# Patient Record
Sex: Female | Born: 1940
Health system: Southern US, Community
[De-identification: ages and names within clinical notes are randomized; demographics above are authoritative.]

## PROBLEM LIST (undated history)

## (undated) DIAGNOSIS — I350 Nonrheumatic aortic (valve) stenosis: Secondary | ICD-10-CM

## (undated) DIAGNOSIS — E119 Type 2 diabetes mellitus without complications: Secondary | ICD-10-CM

## (undated) DIAGNOSIS — E785 Hyperlipidemia, unspecified: Secondary | ICD-10-CM

## (undated) DIAGNOSIS — Z859 Personal history of malignant neoplasm, unspecified: Secondary | ICD-10-CM

## (undated) DIAGNOSIS — N201 Calculus of ureter: Secondary | ICD-10-CM

## (undated) DIAGNOSIS — Z860101 Personal history of adenomatous and serrated colon polyps: Secondary | ICD-10-CM

## (undated) DIAGNOSIS — J302 Other seasonal allergic rhinitis: Secondary | ICD-10-CM

## (undated) DIAGNOSIS — J449 Chronic obstructive pulmonary disease, unspecified: Secondary | ICD-10-CM

## (undated) DIAGNOSIS — G4733 Obstructive sleep apnea (adult) (pediatric): Secondary | ICD-10-CM

## (undated) DIAGNOSIS — Z9989 Dependence on other enabling machines and devices: Secondary | ICD-10-CM

## (undated) DIAGNOSIS — K219 Gastro-esophageal reflux disease without esophagitis: Secondary | ICD-10-CM

## (undated) DIAGNOSIS — R3915 Urgency of urination: Secondary | ICD-10-CM

## (undated) DIAGNOSIS — J3089 Other allergic rhinitis: Secondary | ICD-10-CM

## (undated) DIAGNOSIS — Z86010 Personal history of colonic polyps: Secondary | ICD-10-CM

## (undated) DIAGNOSIS — I10 Essential (primary) hypertension: Secondary | ICD-10-CM

## (undated) DIAGNOSIS — Z87442 Personal history of urinary calculi: Secondary | ICD-10-CM

## (undated) DIAGNOSIS — Z9889 Other specified postprocedural states: Secondary | ICD-10-CM

## (undated) DIAGNOSIS — D509 Iron deficiency anemia, unspecified: Secondary | ICD-10-CM

## (undated) DIAGNOSIS — R319 Hematuria, unspecified: Secondary | ICD-10-CM

## (undated) HISTORY — PX: CT CHEST WITH CONTRAST   (ARMC HX): HXRAD1346

## (undated) HISTORY — DX: Essential (primary) hypertension: I10

## (undated) HISTORY — PX: TUBAL LIGATION: SHX77

## (undated) HISTORY — DX: Gastro-esophageal reflux disease without esophagitis: K21.9

## (undated) HISTORY — PX: NM MYOVIEW LTD: HXRAD82

## (undated) HISTORY — PX: COLONOSCOPY: SHX174

## (undated) HISTORY — PX: DOBUTAMINE STRESS ECHO: SHX5426

---
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
## 2000-05-07 ENCOUNTER — Other Ambulatory Visit: Admission: RE | Admit: 2000-05-07 | Discharge: 2000-05-07 | Payer: Self-pay | Admitting: Obstetrics and Gynecology

## 2000-05-09 ENCOUNTER — Emergency Department (HOSPITAL_COMMUNITY): Admission: EM | Admit: 2000-05-09 | Discharge: 2000-05-09 | Payer: Self-pay | Admitting: Emergency Medicine

## 2000-05-09 ENCOUNTER — Encounter: Payer: Self-pay | Admitting: Emergency Medicine

## 2000-07-30 ENCOUNTER — Encounter (INDEPENDENT_AMBULATORY_CARE_PROVIDER_SITE_OTHER): Payer: Self-pay | Admitting: Specialist

## 2000-07-30 ENCOUNTER — Ambulatory Visit (HOSPITAL_COMMUNITY): Admission: RE | Admit: 2000-07-30 | Discharge: 2000-07-30 | Payer: Self-pay | Admitting: Obstetrics and Gynecology

## 2000-07-30 HISTORY — PX: OTHER SURGICAL HISTORY: SHX169

## 2001-05-19 ENCOUNTER — Encounter: Payer: Self-pay | Admitting: Obstetrics and Gynecology

## 2001-05-19 ENCOUNTER — Encounter: Admission: RE | Admit: 2001-05-19 | Discharge: 2001-05-19 | Payer: Self-pay | Admitting: Obstetrics and Gynecology

## 2001-06-06 ENCOUNTER — Encounter: Payer: Self-pay | Admitting: Gastroenterology

## 2001-06-06 ENCOUNTER — Ambulatory Visit (HOSPITAL_COMMUNITY): Admission: RE | Admit: 2001-06-06 | Discharge: 2001-06-06 | Payer: Self-pay | Admitting: Gastroenterology

## 2001-07-21 ENCOUNTER — Ambulatory Visit (HOSPITAL_BASED_OUTPATIENT_CLINIC_OR_DEPARTMENT_OTHER): Admission: RE | Admit: 2001-07-21 | Discharge: 2001-07-21 | Payer: Self-pay | Admitting: Otolaryngology

## 2001-08-18 ENCOUNTER — Encounter: Payer: Self-pay | Admitting: Surgery

## 2001-08-20 ENCOUNTER — Encounter: Payer: Self-pay | Admitting: Surgery

## 2001-08-20 ENCOUNTER — Encounter (INDEPENDENT_AMBULATORY_CARE_PROVIDER_SITE_OTHER): Payer: Self-pay | Admitting: Specialist

## 2001-08-20 ENCOUNTER — Observation Stay (HOSPITAL_COMMUNITY): Admission: RE | Admit: 2001-08-20 | Discharge: 2001-08-21 | Payer: Self-pay | Admitting: Surgery

## 2001-08-20 ENCOUNTER — Encounter: Payer: Self-pay | Admitting: Gastroenterology

## 2001-08-20 HISTORY — PX: LAPAROSCOPIC CHOLECYSTECTOMY: SUR755

## 2001-12-31 HISTORY — PX: CARPAL TUNNEL RELEASE: SHX101

## 2002-05-19 ENCOUNTER — Encounter (INDEPENDENT_AMBULATORY_CARE_PROVIDER_SITE_OTHER): Payer: Self-pay

## 2002-05-19 ENCOUNTER — Ambulatory Visit (HOSPITAL_COMMUNITY): Admission: RE | Admit: 2002-05-19 | Discharge: 2002-05-19 | Payer: Self-pay | Admitting: Gastroenterology

## 2002-05-20 ENCOUNTER — Encounter: Admission: RE | Admit: 2002-05-20 | Discharge: 2002-05-20 | Payer: Self-pay | Admitting: Obstetrics and Gynecology

## 2002-05-20 ENCOUNTER — Encounter: Payer: Self-pay | Admitting: Obstetrics and Gynecology

## 2002-05-21 ENCOUNTER — Other Ambulatory Visit: Admission: RE | Admit: 2002-05-21 | Discharge: 2002-05-21 | Payer: Self-pay | Admitting: Obstetrics and Gynecology

## 2003-07-01 ENCOUNTER — Encounter: Payer: Self-pay | Admitting: Obstetrics and Gynecology

## 2003-07-01 ENCOUNTER — Encounter: Admission: RE | Admit: 2003-07-01 | Discharge: 2003-07-01 | Payer: Self-pay | Admitting: Obstetrics and Gynecology

## 2003-07-06 ENCOUNTER — Other Ambulatory Visit: Admission: RE | Admit: 2003-07-06 | Discharge: 2003-07-06 | Payer: Self-pay | Admitting: Obstetrics and Gynecology

## 2003-08-12 ENCOUNTER — Encounter: Payer: Self-pay | Admitting: Obstetrics and Gynecology

## 2003-08-12 ENCOUNTER — Ambulatory Visit (HOSPITAL_COMMUNITY): Admission: RE | Admit: 2003-08-12 | Discharge: 2003-08-12 | Payer: Self-pay | Admitting: Obstetrics and Gynecology

## 2004-07-12 ENCOUNTER — Ambulatory Visit (HOSPITAL_COMMUNITY): Admission: RE | Admit: 2004-07-12 | Discharge: 2004-07-12 | Payer: Self-pay | Admitting: Obstetrics & Gynecology

## 2004-10-23 ENCOUNTER — Encounter
Admission: RE | Admit: 2004-10-23 | Discharge: 2005-01-21 | Payer: Self-pay | Admitting: Physical Medicine & Rehabilitation

## 2005-07-31 ENCOUNTER — Ambulatory Visit (HOSPITAL_COMMUNITY): Admission: RE | Admit: 2005-07-31 | Discharge: 2005-07-31 | Payer: Self-pay | Admitting: Obstetrics & Gynecology

## 2005-12-23 ENCOUNTER — Emergency Department (HOSPITAL_COMMUNITY): Admission: EM | Admit: 2005-12-23 | Discharge: 2005-12-23 | Payer: Self-pay | Admitting: Emergency Medicine

## 2006-04-29 ENCOUNTER — Encounter: Admission: RE | Admit: 2006-04-29 | Discharge: 2006-04-29 | Payer: Self-pay | Admitting: Orthopedic Surgery

## 2006-04-29 IMAGING — RF DG MYELOGRAM LUMBAR
14 of 16 series · 14 of 16 positions shown · IV contrast (omnipaque)
Comparison: none

CLINICAL DATA: Low back pain and right lower extremity pain with standing and
walking. No previous lumbar surgery.

LUMBAR MYELOGRAM:
CT LUMBAR SPINE WITH INTRATHECAL CONTRAST:
TECHNIQUE: An appropriate entry site was determined under fluoroscopy. Skin site
was marked, prepped with Betadine, and draped in usual sterile fashion, and
infiltrated locally with 1% lidocaine. A 22 gauge spinal needle was  advanced
into the thecal sac at L4-L5 using an interlaminar approach . Clear colorless
CSF returned. 17 ml Omnipaque 180 were administered intrathecally for lumbar
myelography, followed by axial CT scanning of the lumbar spine. Coronal and
sagittal reconstructions were generated from the axial scan.

[Series 1: (hospital) · 1 of 1 slices shown]
[im 1/1]
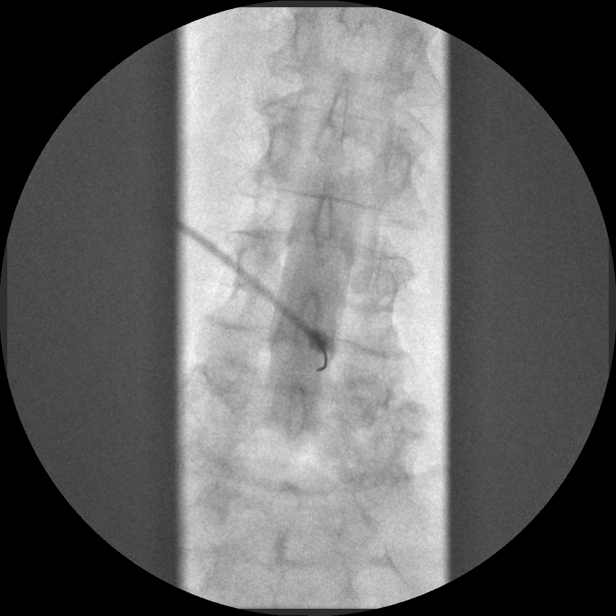

[Series 2: myelogram  white · 1 of 1 slices shown (1 of 13)]
[im 1/1]
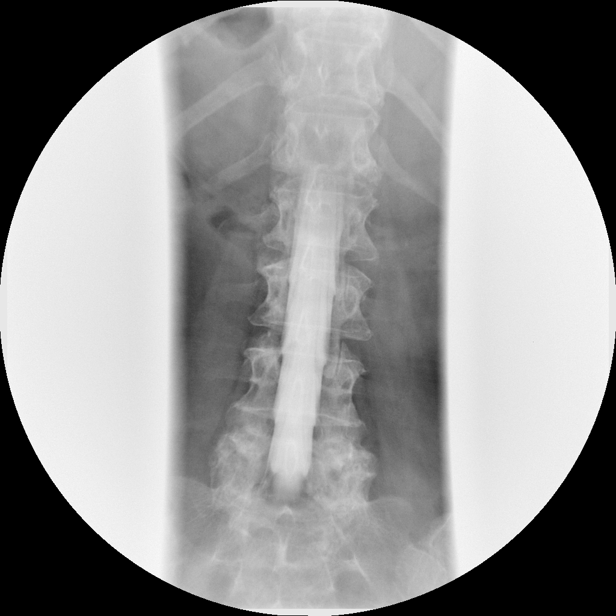

[Series 3: myelogram  white · 1 of 1 slices shown (2 of 13)]
[im 1/1]
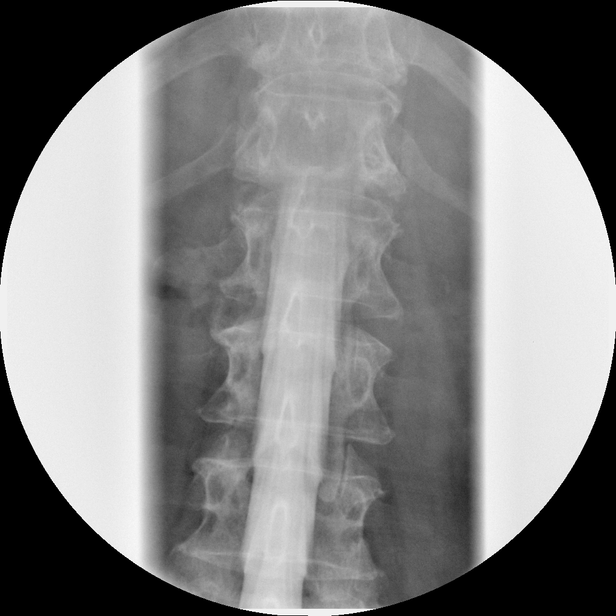

[Series 5: myelogram  white · 1 of 1 slices shown (3 of 13)]
[im 1/1]
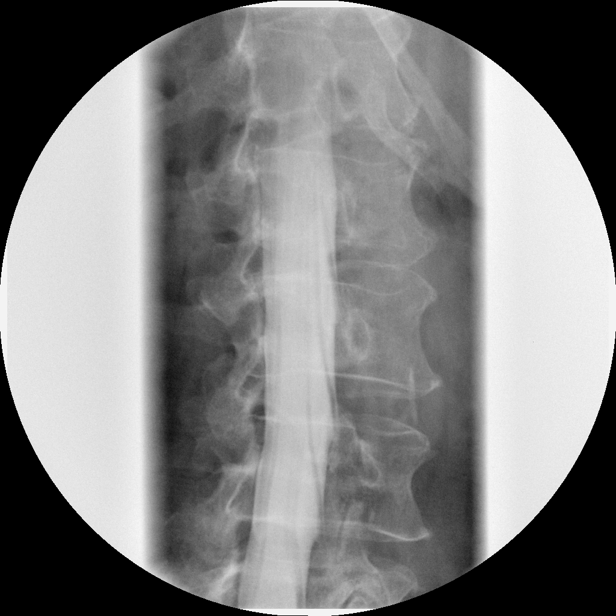

[Series 6: myelogram  white · 1 of 1 slices shown (4 of 13)]
[im 1/1]
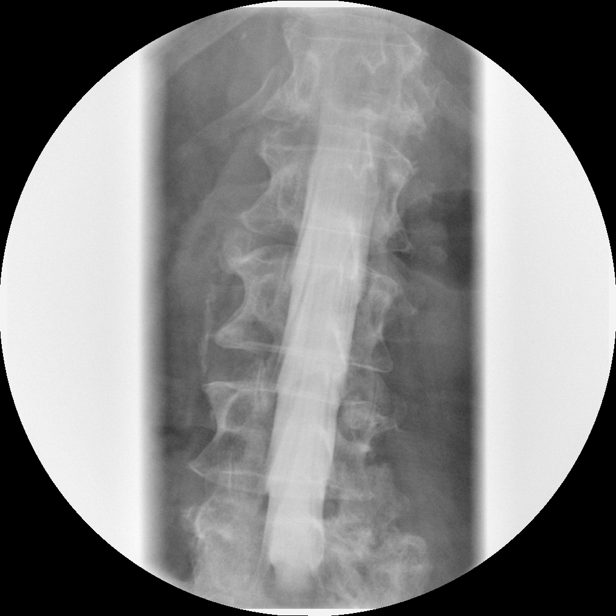

[Series 7: myelogram  white · 1 of 1 slices shown (5 of 13)]
[im 1/1]
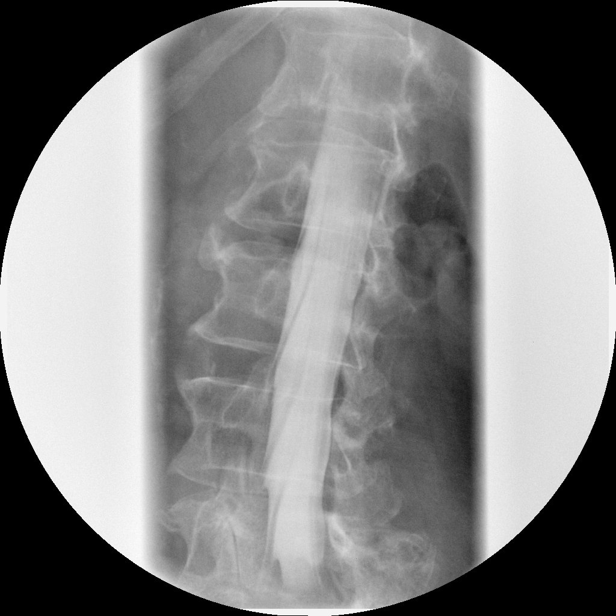

[Series 8: myelogram  white · 1 of 1 slices shown (6 of 13)]
[im 1/1]
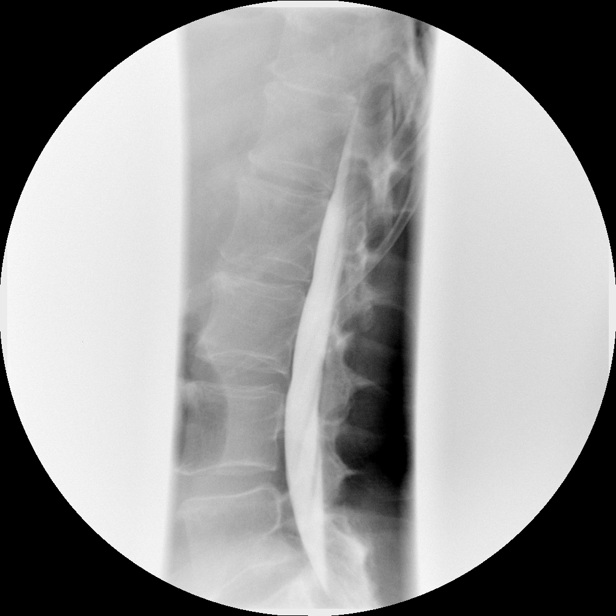

[Series 9: myelogram  white · 1 of 1 slices shown (7 of 13)]
[im 1/1]
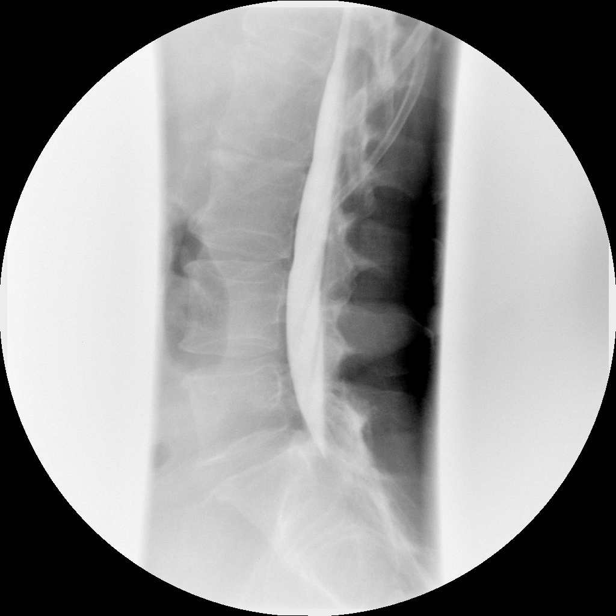

[Series 10: myelogram  white · 1 of 1 slices shown (8 of 13)]
[im 1/1]
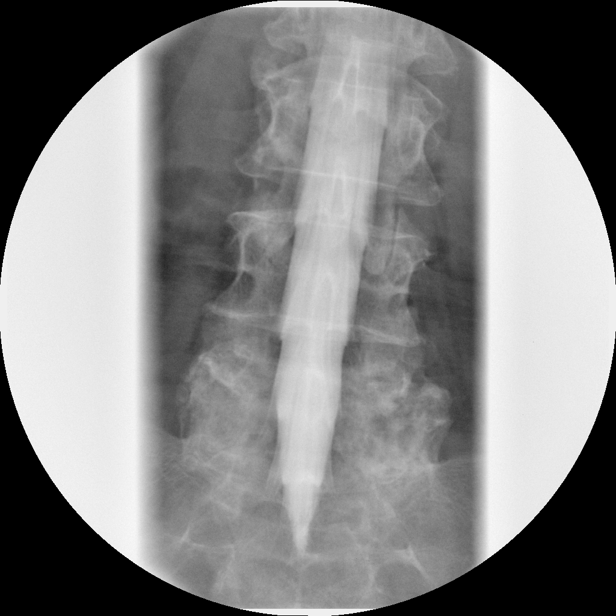

[Series 11: myelogram  white · 1 of 1 slices shown (9 of 13)]
[im 1/1]
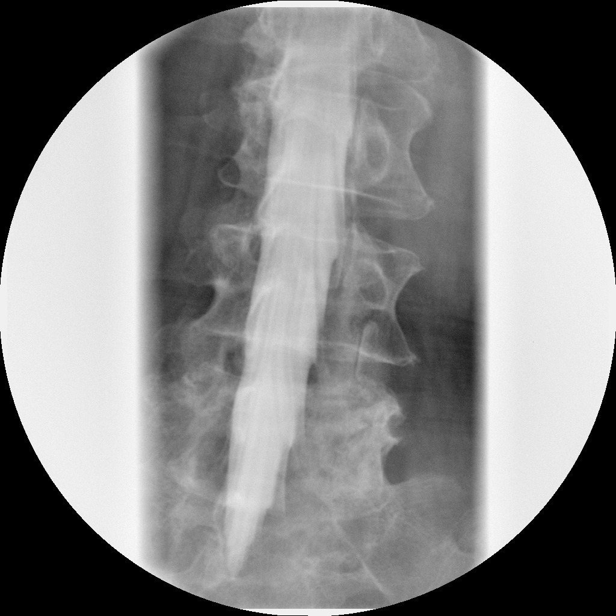

[Series 13: myelogram  white · 1 of 1 slices shown (10 of 13)]
[im 1/1]
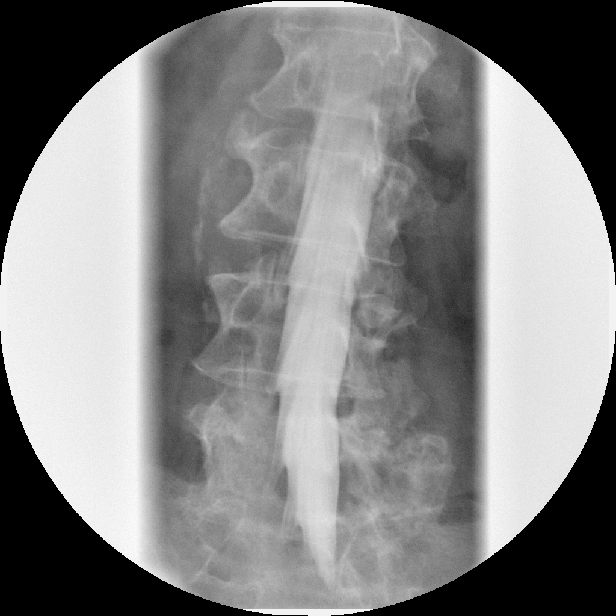

[Series 14: myelogram  white · 1 of 1 slices shown (11 of 13)]
[im 1/1]
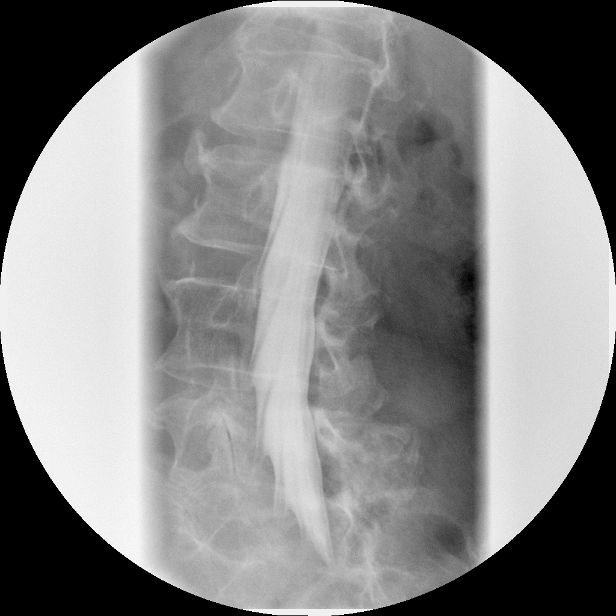

[Series 15: myelogram  white · 1 of 1 slices shown (12 of 13)]
[im 1/1]
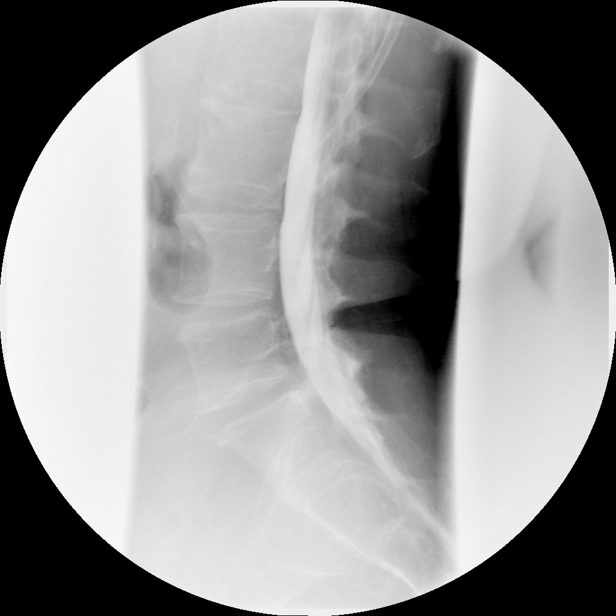

[Series 16: myelogram  white · 1 of 1 slices shown (13 of 13)]
[im 1/1]
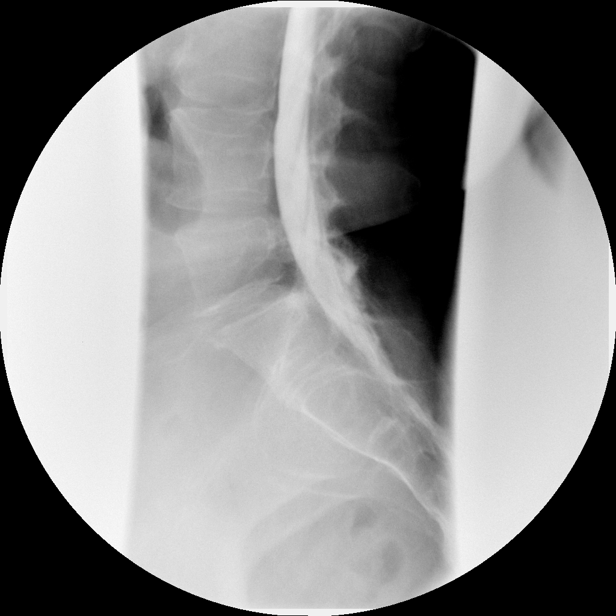

[14 of 16 positions shown; findings below may reference images not displayed]

No previous for comparison.

L1-L2: Mild anterior end plate spurring. Normal conus terminates at the level of
the interspace. No spinal stenosis. Neural foramina widely patent.

L2-L3: Mild circumferential disc bulge with anterior endplate spurs. Mild
bilateral facet degenerative hypertrophy. No spinal stenosis. No significant
neural foraminal encroachment.

L3-L4: Mild asymmetric disc bulge left greater than right. Mild bilateral facet
degenerative hypertrophy and some early thickening of ligamentum flavum. No
spinal stenosis. No significant neural foraminal encroachment.

L4-L5: Bilateral facet degenerative hypertrophy and thickening of ligamentum
flavum. Mild circumferential disc bulge. No significant spinal stenosis. Neural
foramina remain widely patent.

L5-S1: Advanced bilateral facet degenerative hypertrophy, right worse than left.
Mild circumferential disc bulge. Early grade 1 anterolisthesis probably
secondary to facet disease. No pars defect. Early encroachment on the neural
foramina bilaterally, left greater than right. No spinal stenosis.

Patchy atheromatous calcification throughout the abdominal aorta without
aneurysm. Standing lateral flexion and extension radiographs demonstrate no
dynamic instability.
IMPRESSION: 1. Mild facet disease and disc bulges L2-L3, L3-L4, L4-L5 without compressive
pathology.
2. Advanced bilateral facet degenerative changes L5-S1 with early grade 1
anterolisthesis and early neural foraminal encroachment, left greater than
right.
3. No significant spinal stenosis.

## 2006-04-29 IMAGING — CR DG MYELOGRAM LUMBAR
3 series · 3 of 3 positions shown · IV contrast (omnipaque)
Comparison: none

CLINICAL DATA: Low back pain and right lower extremity pain with standing and
walking. No previous lumbar surgery.

LUMBAR MYELOGRAM:
CT LUMBAR SPINE WITH INTRATHECAL CONTRAST:
TECHNIQUE: An appropriate entry site was determined under fluoroscopy. Skin site
was marked, prepped with Betadine, and draped in usual sterile fashion, and
infiltrated locally with 1% lidocaine. A 22 gauge spinal needle was  advanced
into the thecal sac at L4-L5 using an interlaminar approach . Clear colorless
CSF returned. 17 ml Omnipaque 180 were administered intrathecally for lumbar
myelography, followed by axial CT scanning of the lumbar spine. Coronal and
sagittal reconstructions were generated from the axial scan.

[view not recorded (1 of 3)]
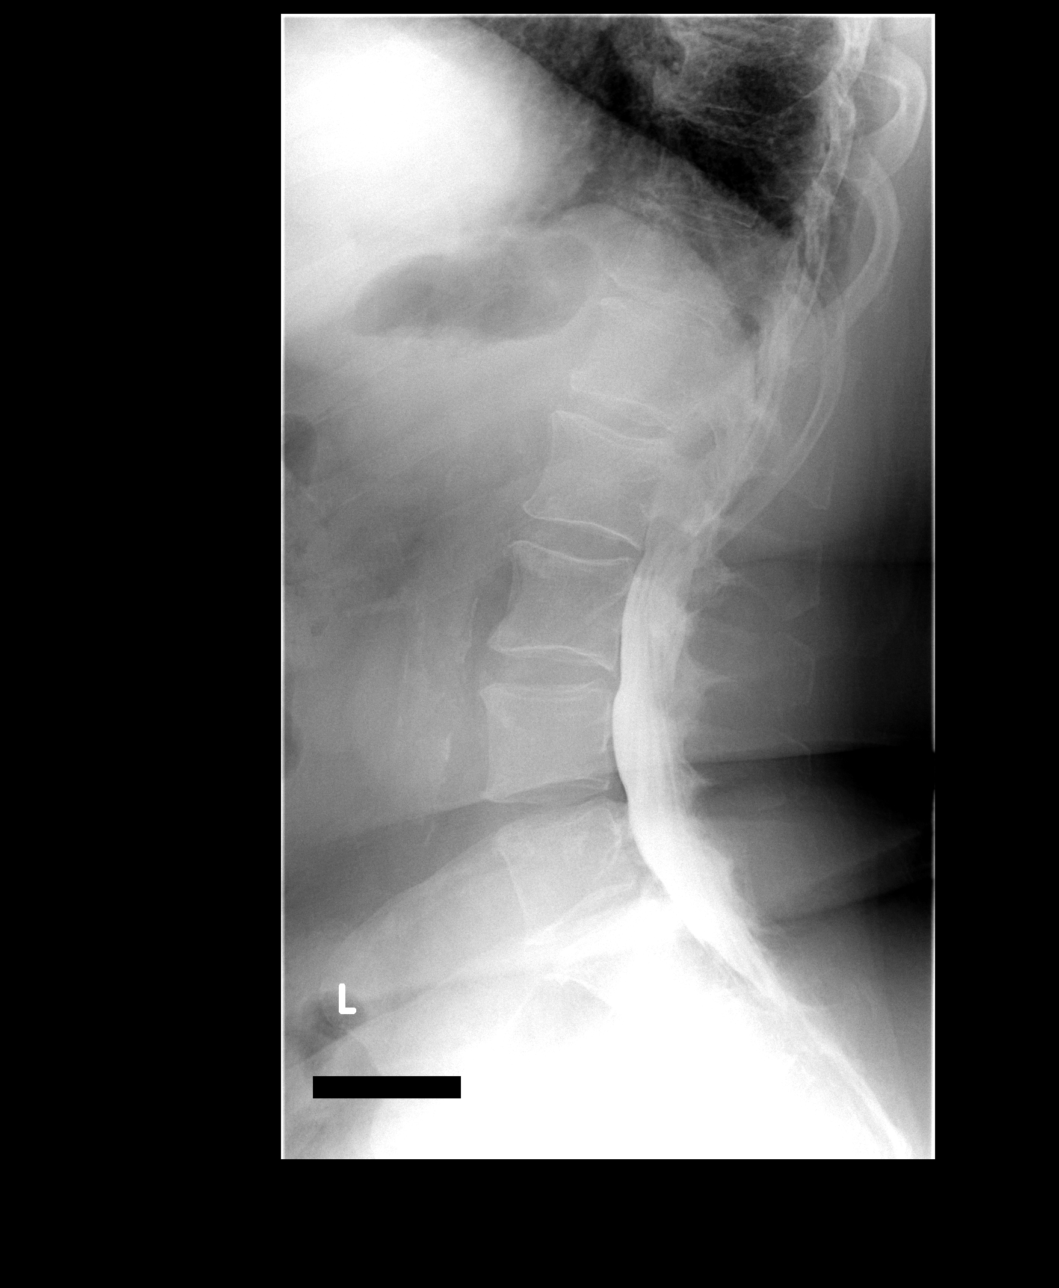

[view not recorded (2 of 3)]
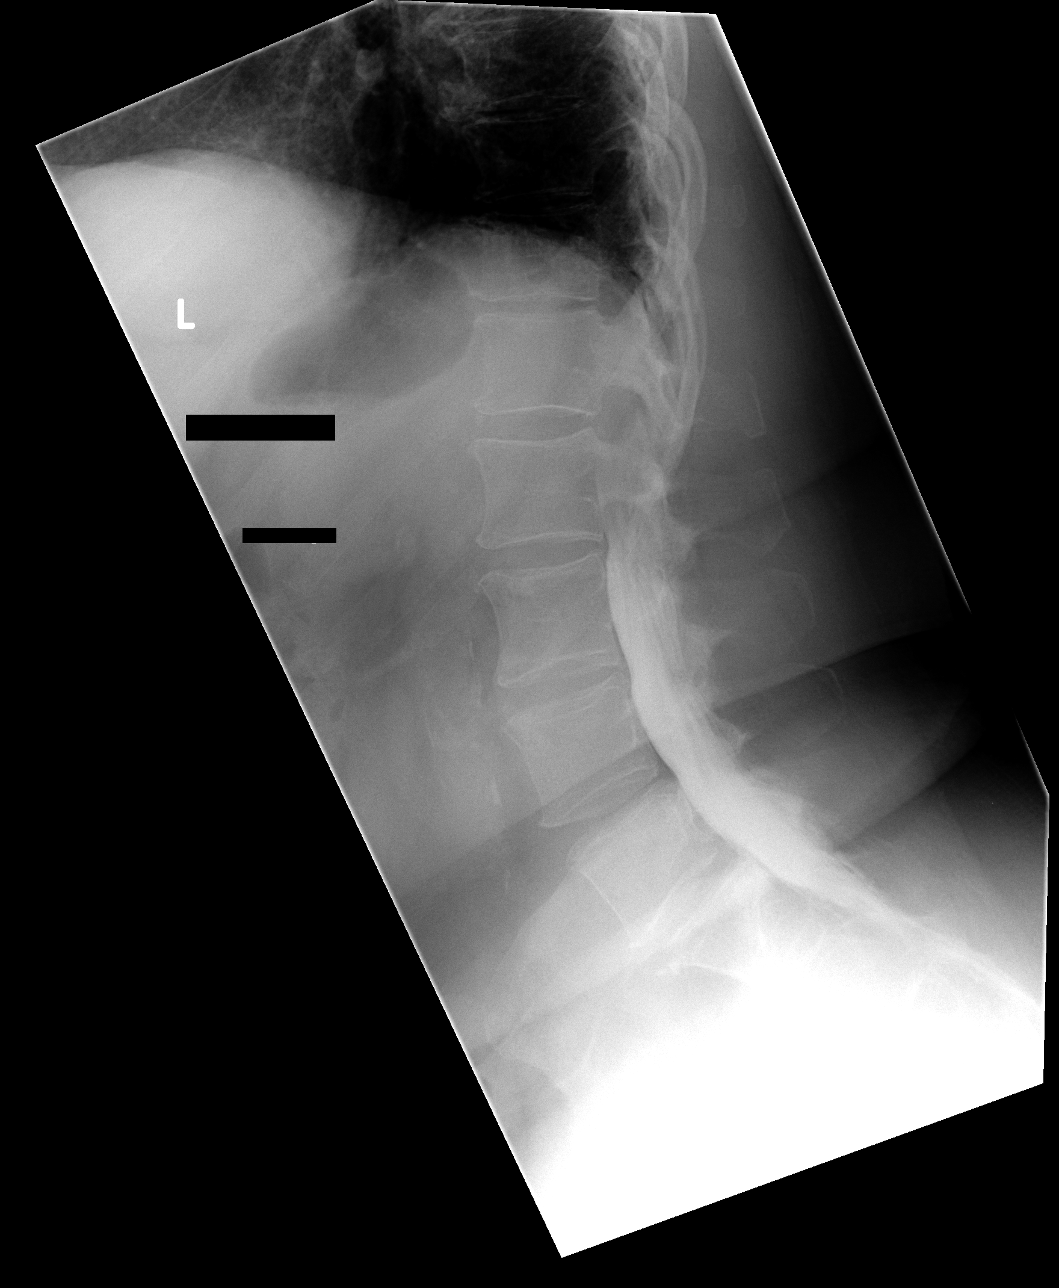

[view not recorded (3 of 3)]
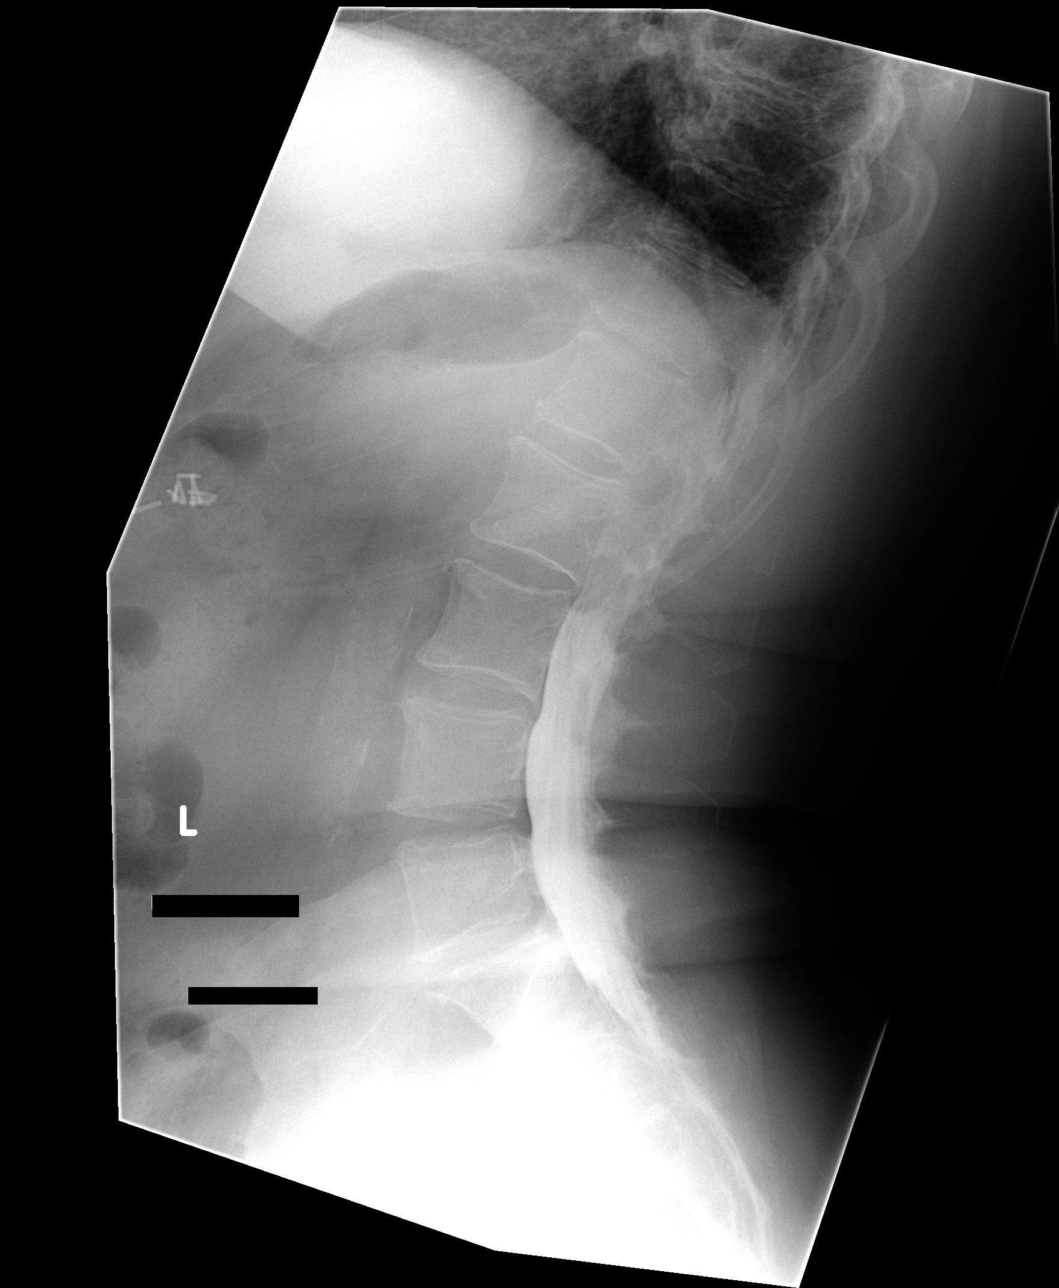

[3 of 3 positions shown; findings below may reference images not displayed]

No previous for comparison.

L1-L2: Mild anterior end plate spurring. Normal conus terminates at the level of
the interspace. No spinal stenosis. Neural foramina widely patent.

L2-L3: Mild circumferential disc bulge with anterior endplate spurs. Mild
bilateral facet degenerative hypertrophy. No spinal stenosis. No significant
neural foraminal encroachment.

L3-L4: Mild asymmetric disc bulge left greater than right. Mild bilateral facet
degenerative hypertrophy and some early thickening of ligamentum flavum. No
spinal stenosis. No significant neural foraminal encroachment.

L4-L5: Bilateral facet degenerative hypertrophy and thickening of ligamentum
flavum. Mild circumferential disc bulge. No significant spinal stenosis. Neural
foramina remain widely patent.

L5-S1: Advanced bilateral facet degenerative hypertrophy, right worse than left.
Mild circumferential disc bulge. Early grade 1 anterolisthesis probably
secondary to facet disease. No pars defect. Early encroachment on the neural
foramina bilaterally, left greater than right. No spinal stenosis.

Patchy atheromatous calcification throughout the abdominal aorta without
aneurysm. Standing lateral flexion and extension radiographs demonstrate no
dynamic instability.
IMPRESSION: 1. Mild facet disease and disc bulges L2-L3, L3-L4, L4-L5 without compressive
pathology.
2. Advanced bilateral facet degenerative changes L5-S1 with early grade 1
anterolisthesis and early neural foraminal encroachment, left greater than
right.
3. No significant spinal stenosis.

## 2006-04-29 IMAGING — CT CT L SPINE W/ CM
3 of 8 series · 15 of 33 positions shown, 18 images · IV contrast (omnipaque)
Comparison: none

CLINICAL DATA: Low back pain and right lower extremity pain with standing and
walking. No previous lumbar surgery.

LUMBAR MYELOGRAM:
CT LUMBAR SPINE WITH INTRATHECAL CONTRAST:
TECHNIQUE: An appropriate entry site was determined under fluoroscopy. Skin site
was marked, prepped with Betadine, and draped in usual sterile fashion, and
infiltrated locally with 1% lidocaine. A 22 gauge spinal needle was  advanced
into the thecal sac at L4-L5 using an interlaminar approach . Clear colorless
CSF returned. 17 ml Omnipaque 180 were administered intrathecally for lumbar
myelography, followed by axial CT scanning of the lumbar spine. Coronal and
sagittal reconstructions were generated from the axial scan.

[Series 4: recon 3: l-spine helical · axial · 0.27mm/px · z∈[-46,+78]mm · 7 of 133 slices shown, 9 images]
[im 17/133  soft-tissue]
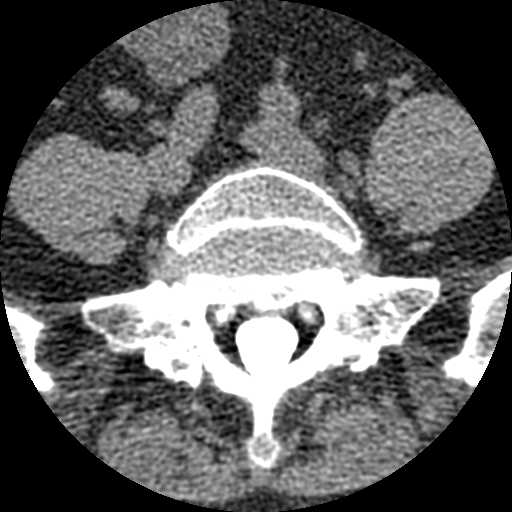
[im 17/133  bone]
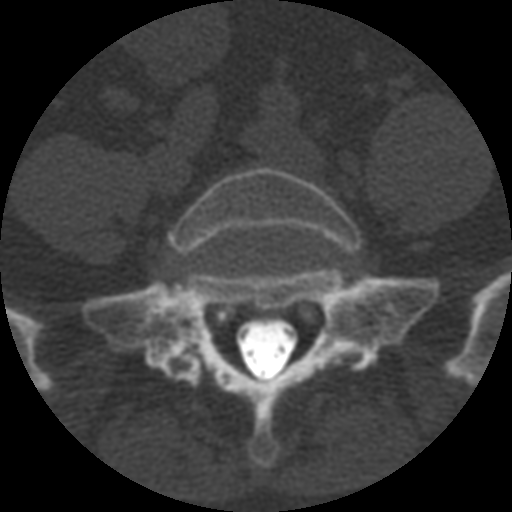
[im 34/133  bone]
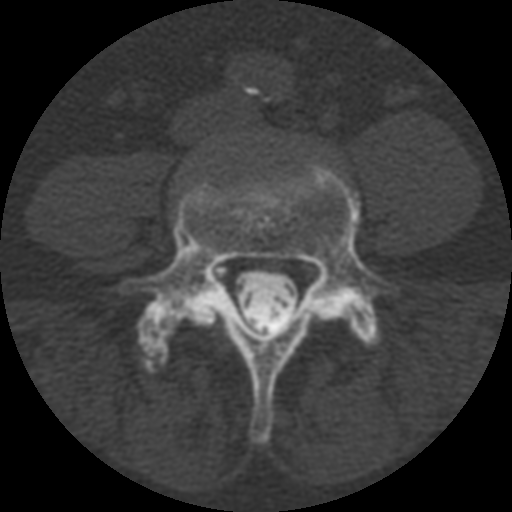
[im 50/133  bone]
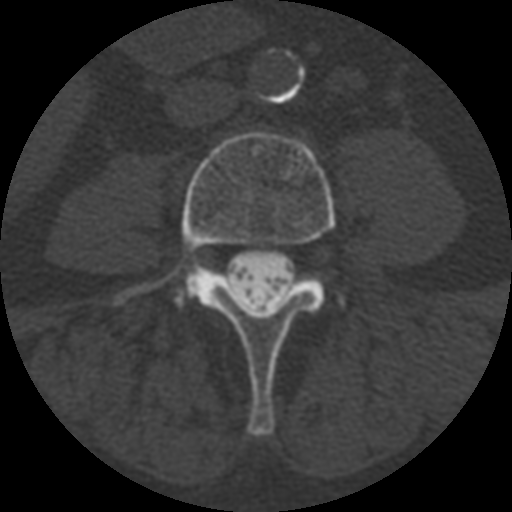
[im 67/133  bone]
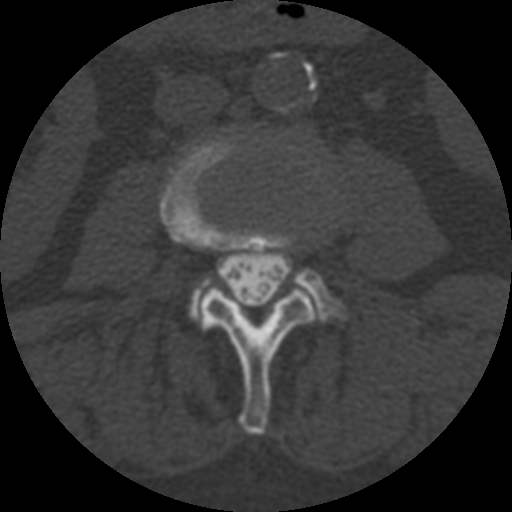
[im 83/133  soft-tissue]
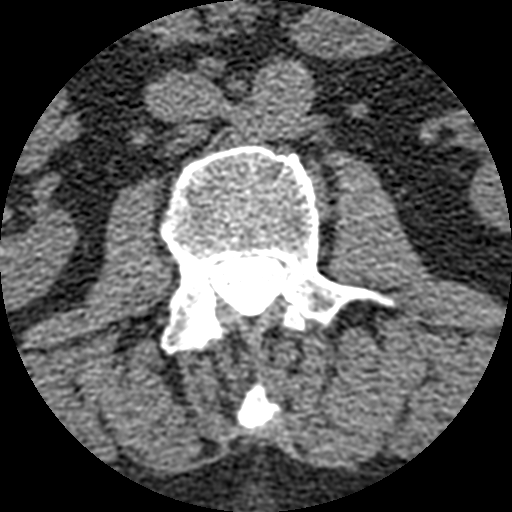
[im 83/133  bone]
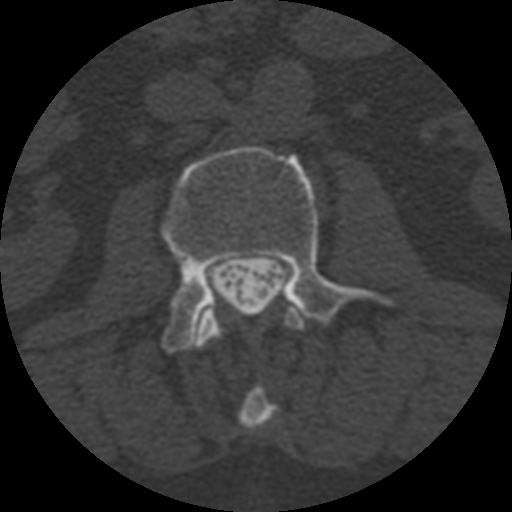
[im 100/133  bone]
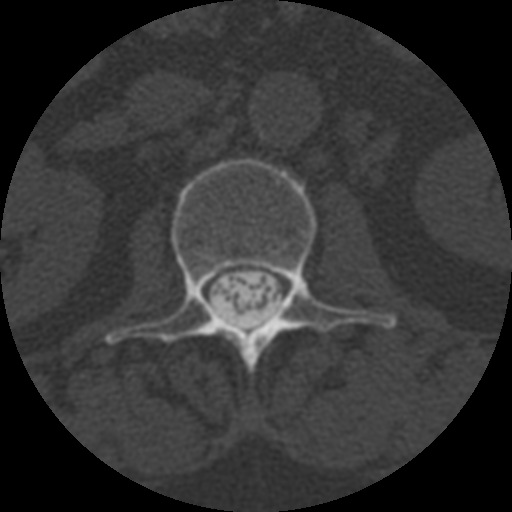
[im 116/133  bone]
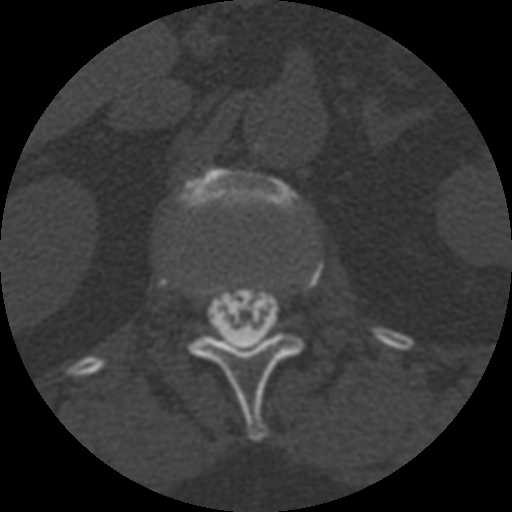

[Series 400: reformatted · sagittal · 0.33mm/px · 5 of 40 slices shown, 6 images (1 of 2)]
[im 14/40  bone]
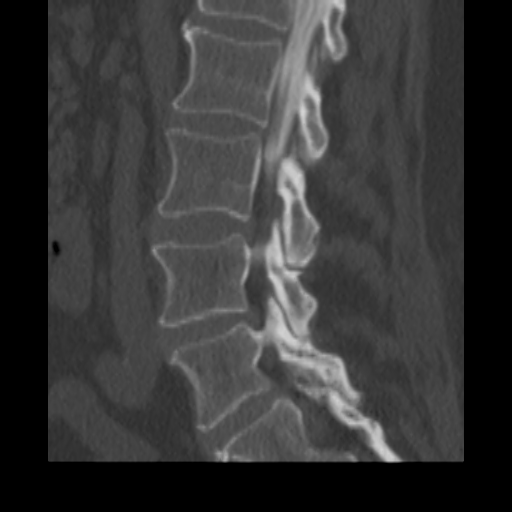
[im 17/40  bone]
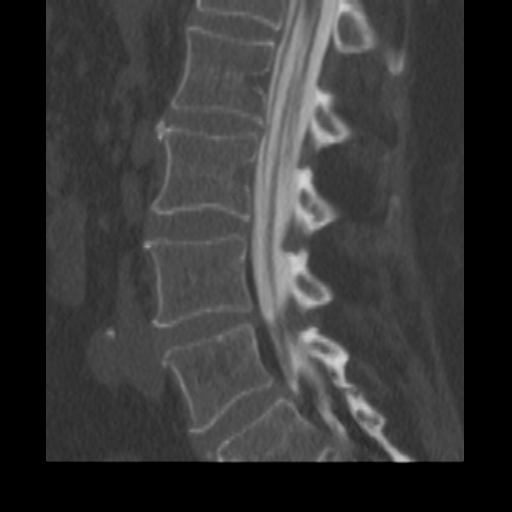
[im 20/40  soft-tissue]
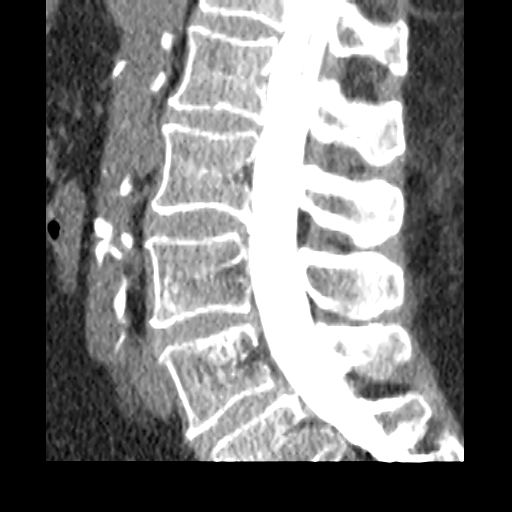
[im 20/40  bone]
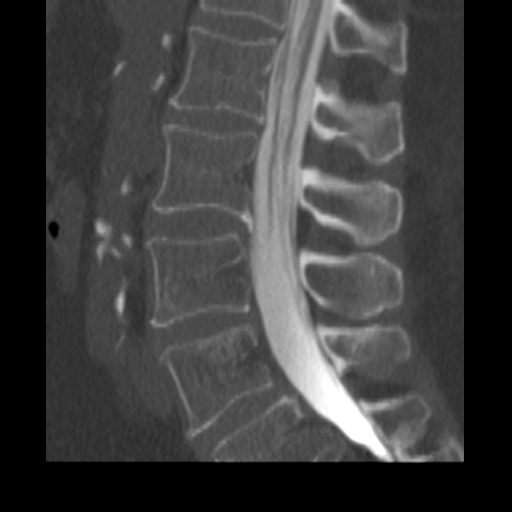
[im 23/40  bone]
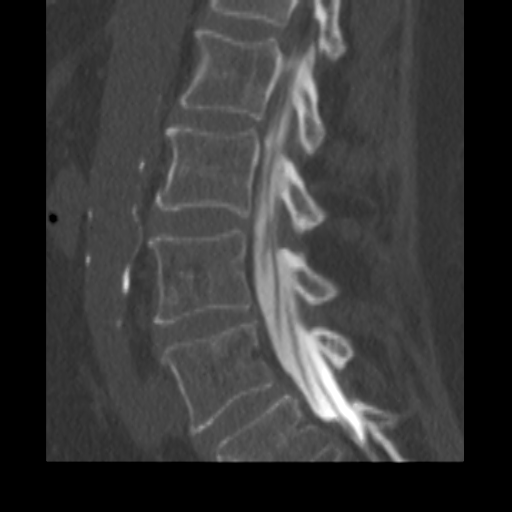
[im 27/40  bone]
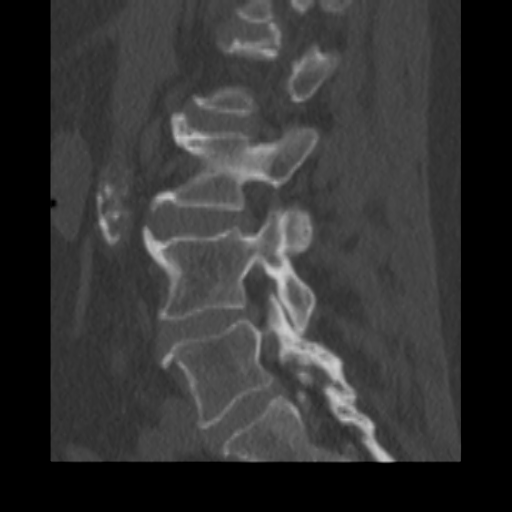

[Series 401: reformatted · coronal · 0.33mm/px · 3 of 40 slices shown (2 of 2)]
[im 8/40  bone]
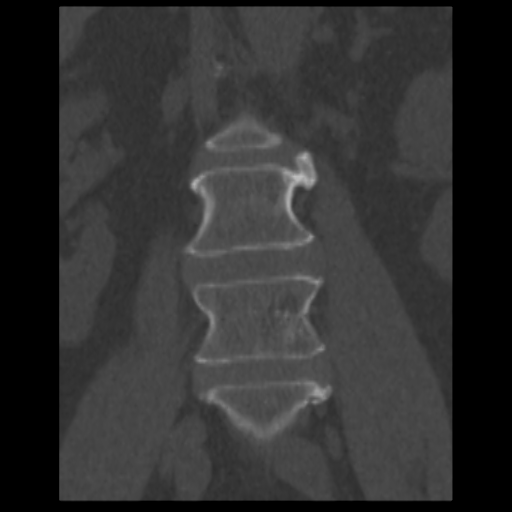
[im 16/40  bone]
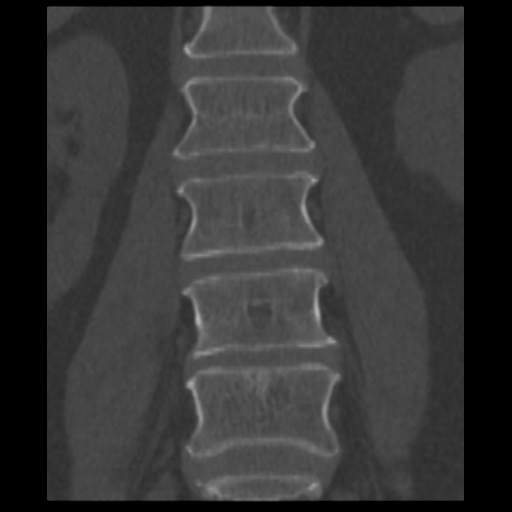
[im 24/40  bone]
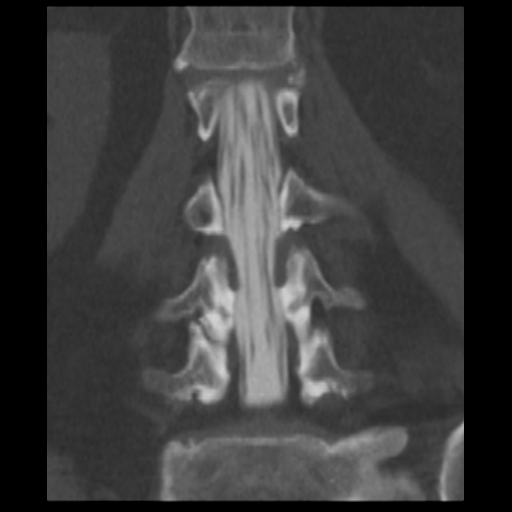

[15 of 33 positions shown; findings below may reference images not displayed]

No previous for comparison.

L1-L2: Mild anterior end plate spurring. Normal conus terminates at the level of
the interspace. No spinal stenosis. Neural foramina widely patent.

L2-L3: Mild circumferential disc bulge with anterior endplate spurs. Mild
bilateral facet degenerative hypertrophy. No spinal stenosis. No significant
neural foraminal encroachment.

L3-L4: Mild asymmetric disc bulge left greater than right. Mild bilateral facet
degenerative hypertrophy and some early thickening of ligamentum flavum. No
spinal stenosis. No significant neural foraminal encroachment.

L4-L5: Bilateral facet degenerative hypertrophy and thickening of ligamentum
flavum. Mild circumferential disc bulge. No significant spinal stenosis. Neural
foramina remain widely patent.

L5-S1: Advanced bilateral facet degenerative hypertrophy, right worse than left.
Mild circumferential disc bulge. Early grade 1 anterolisthesis probably
secondary to facet disease. No pars defect. Early encroachment on the neural
foramina bilaterally, left greater than right. No spinal stenosis.

Patchy atheromatous calcification throughout the abdominal aorta without
aneurysm. Standing lateral flexion and extension radiographs demonstrate no
dynamic instability.
IMPRESSION: 1. Mild facet disease and disc bulges L2-L3, L3-L4, L4-L5 without compressive
pathology.
2. Advanced bilateral facet degenerative changes L5-S1 with early grade 1
anterolisthesis and early neural foraminal encroachment, left greater than
right.
3. No significant spinal stenosis.

## 2006-05-21 ENCOUNTER — Encounter: Admission: RE | Admit: 2006-05-21 | Discharge: 2006-05-21 | Payer: Self-pay | Admitting: Family Medicine

## 2006-05-21 IMAGING — US UNKNOWN US STUDY
1 series · 4 of 4 positions shown · non-contrast
Comparison: none

DG DIAGNOSTIC UNILATERAL R
CC and MLO view(s) were taken of the right breast.

RIGHT BREAST ULTRASOUND
DIGITAL UNILATERAL RIGHT DIAGNOSTIC MAMMOGRAM WITH CAD AND RIGHT BREAST ULTRASOUND:
CLINICAL DATA: A 65-year-old female with palpable lump inferior right breast.

[Series 1: unknown us study · 4 of 4 slices shown]
[im 1/4]
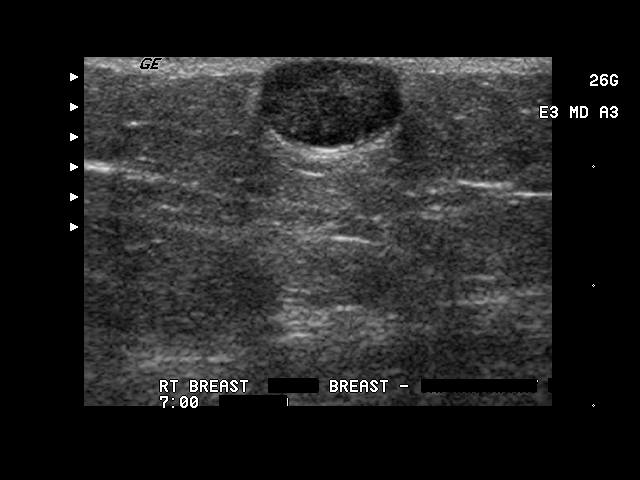
[im 2/4]
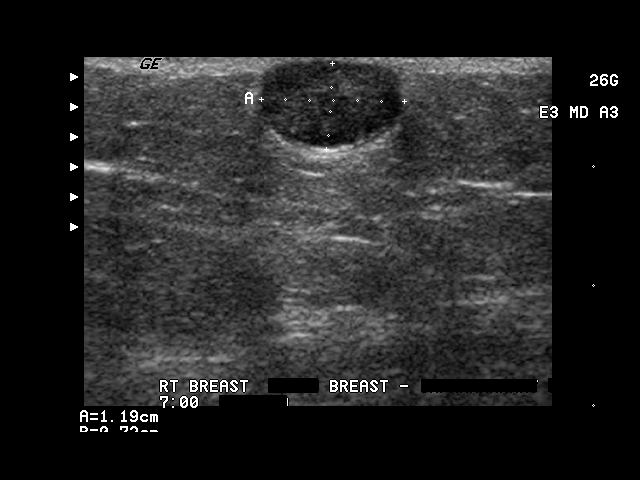
[im 3/4]
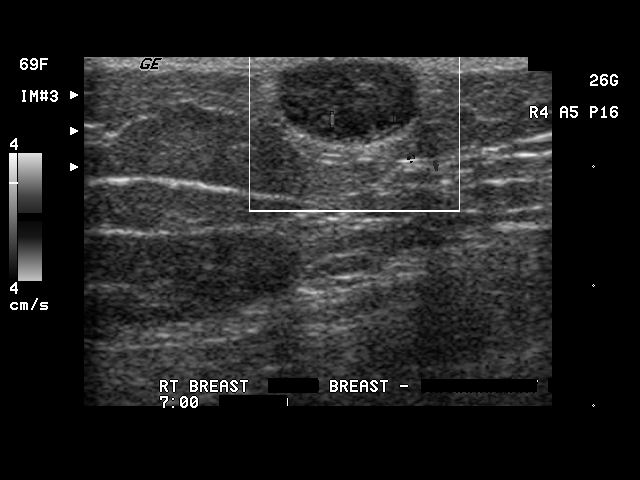
[im 4/4]
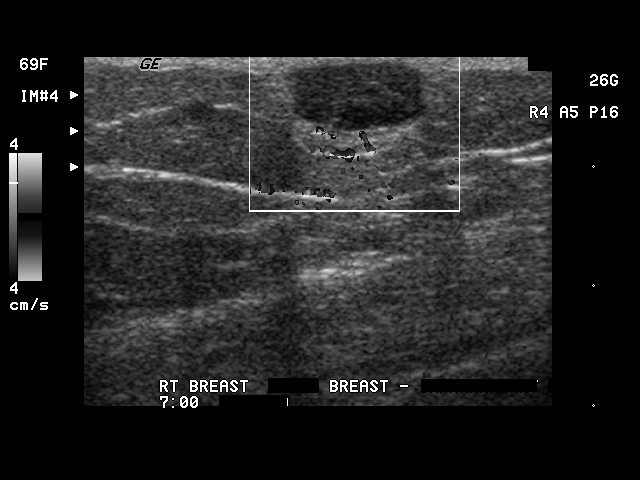

[4 of 4 positions shown; findings below may reference images not displayed]

Comparison to prior films.

Primarily fatty breast tissue is identified.  There is no evidence of mass, architectural 
distortion, or suspicious microcalcifications to suggest malignancy.

On physical examination of the inferior aspect of the right breast, a small moveable nodule in the 
subcutaneous tissues along the right inframammary fold is noted.

Targeted ultrasound of this area demonstrates a 7 x 12 mm well-circumscribed hypoechoic lesion with
posterior through transmission compatible with a sebaceous cyst.  There is no evidence of internal
flow in this lesion.
IMPRESSION: 7 x 12 mm well-circumscribed hypoechoic lesion along the right inframammary fold with physical 
examination and ultrasound findings compatible with a sebaceous cyst.  Recommend annual screening 
mammogram in 3 months.

ASSESSMENT: Benign - BI-RADS 2

Screening mammogram of both breasts in 3 months.
ANALYZED BY COMPUTER AIDED DETECTION. ,

## 2006-05-21 IMAGING — MG MM DIAGNOSTIC UNILATERAL R
3 series · 3 of 3 positions shown · non-contrast
Comparison: none

DG DIAGNOSTIC UNILATERAL R
CC and MLO view(s) were taken of the right breast.

RIGHT BREAST ULTRASOUND
DIGITAL UNILATERAL RIGHT DIAGNOSTIC MAMMOGRAM WITH CAD AND RIGHT BREAST ULTRASOUND:
CLINICAL DATA: A 65-year-old female with palpable lump inferior right breast.

[R CC]
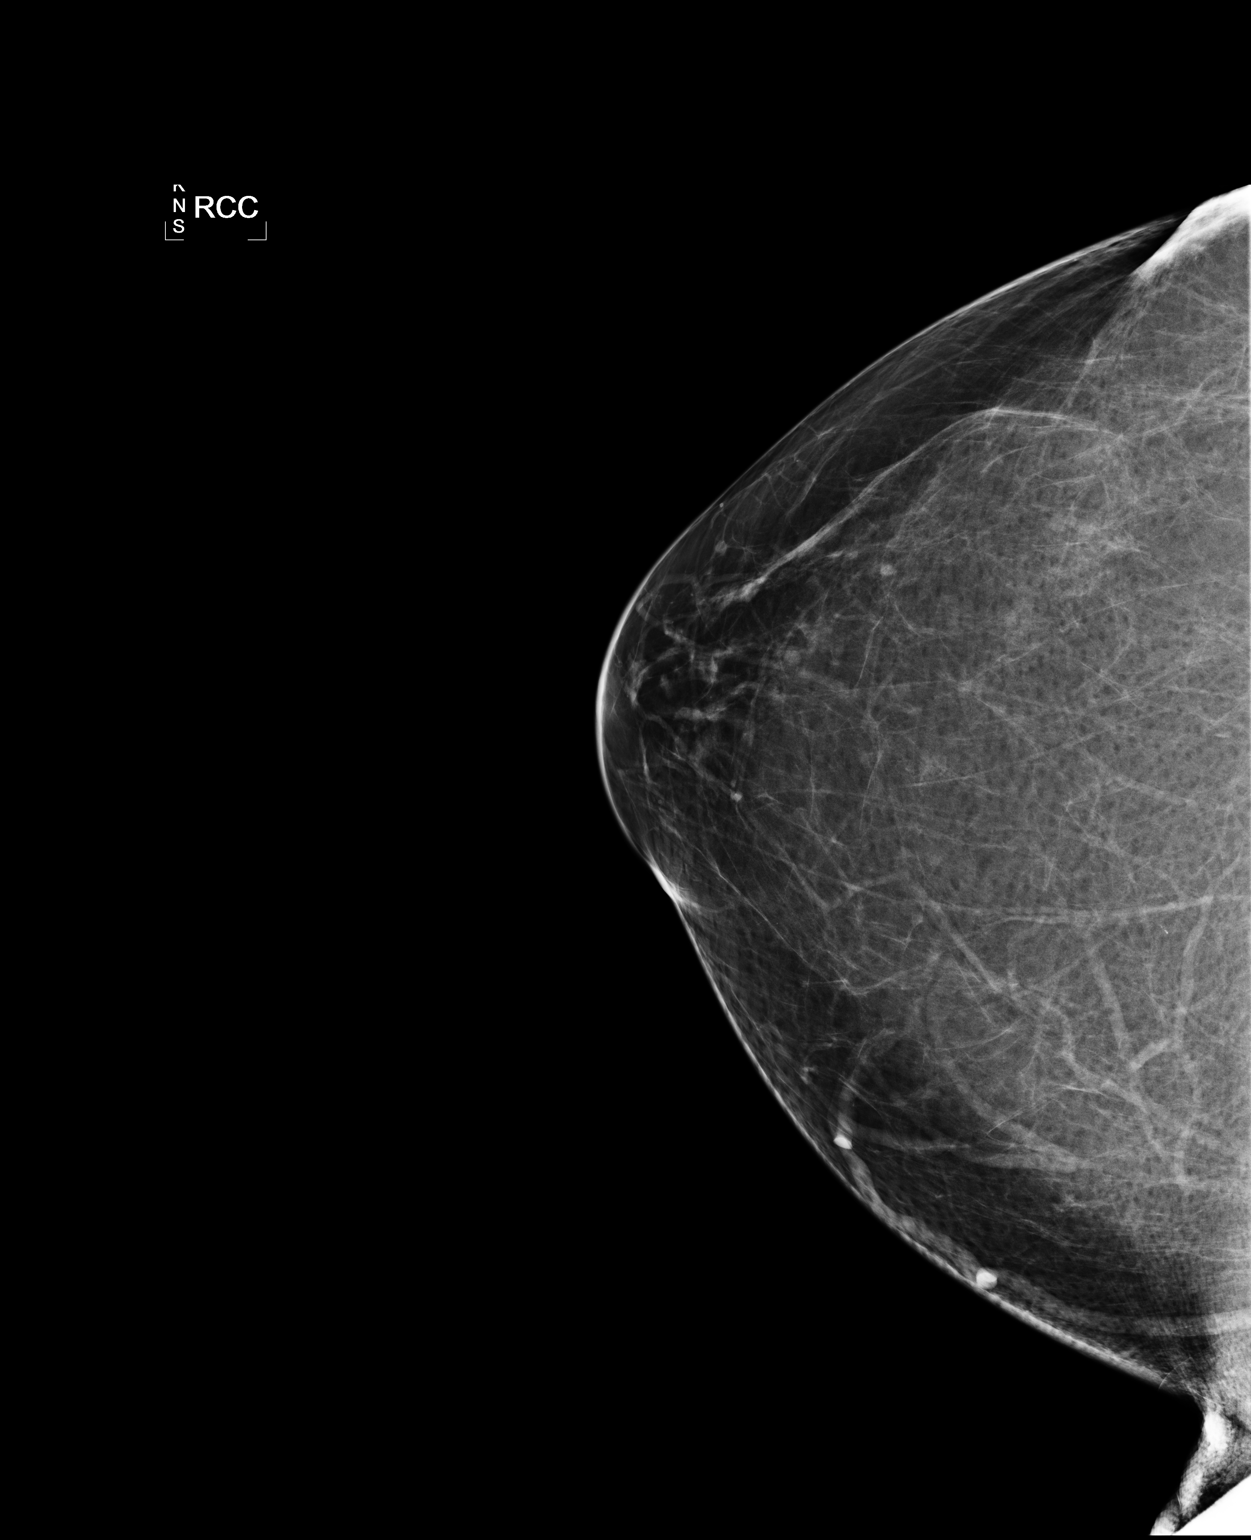

[R MLO]
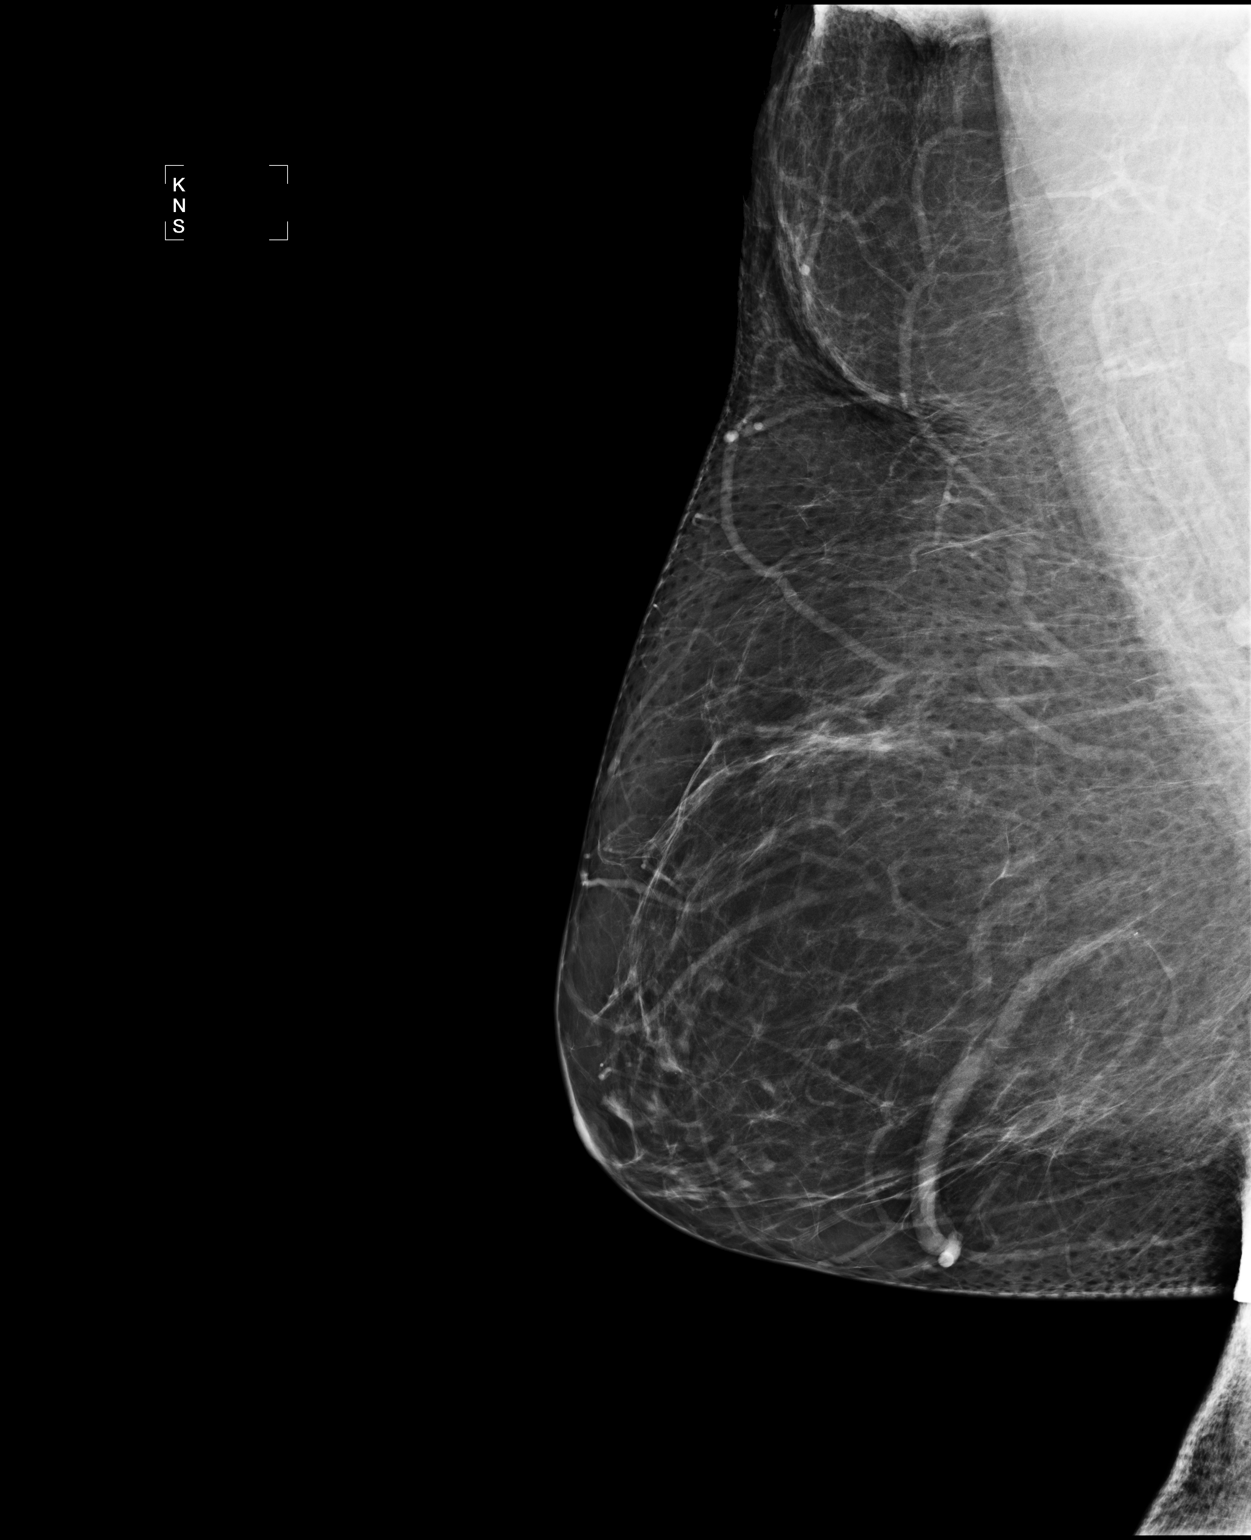

[R TAN]
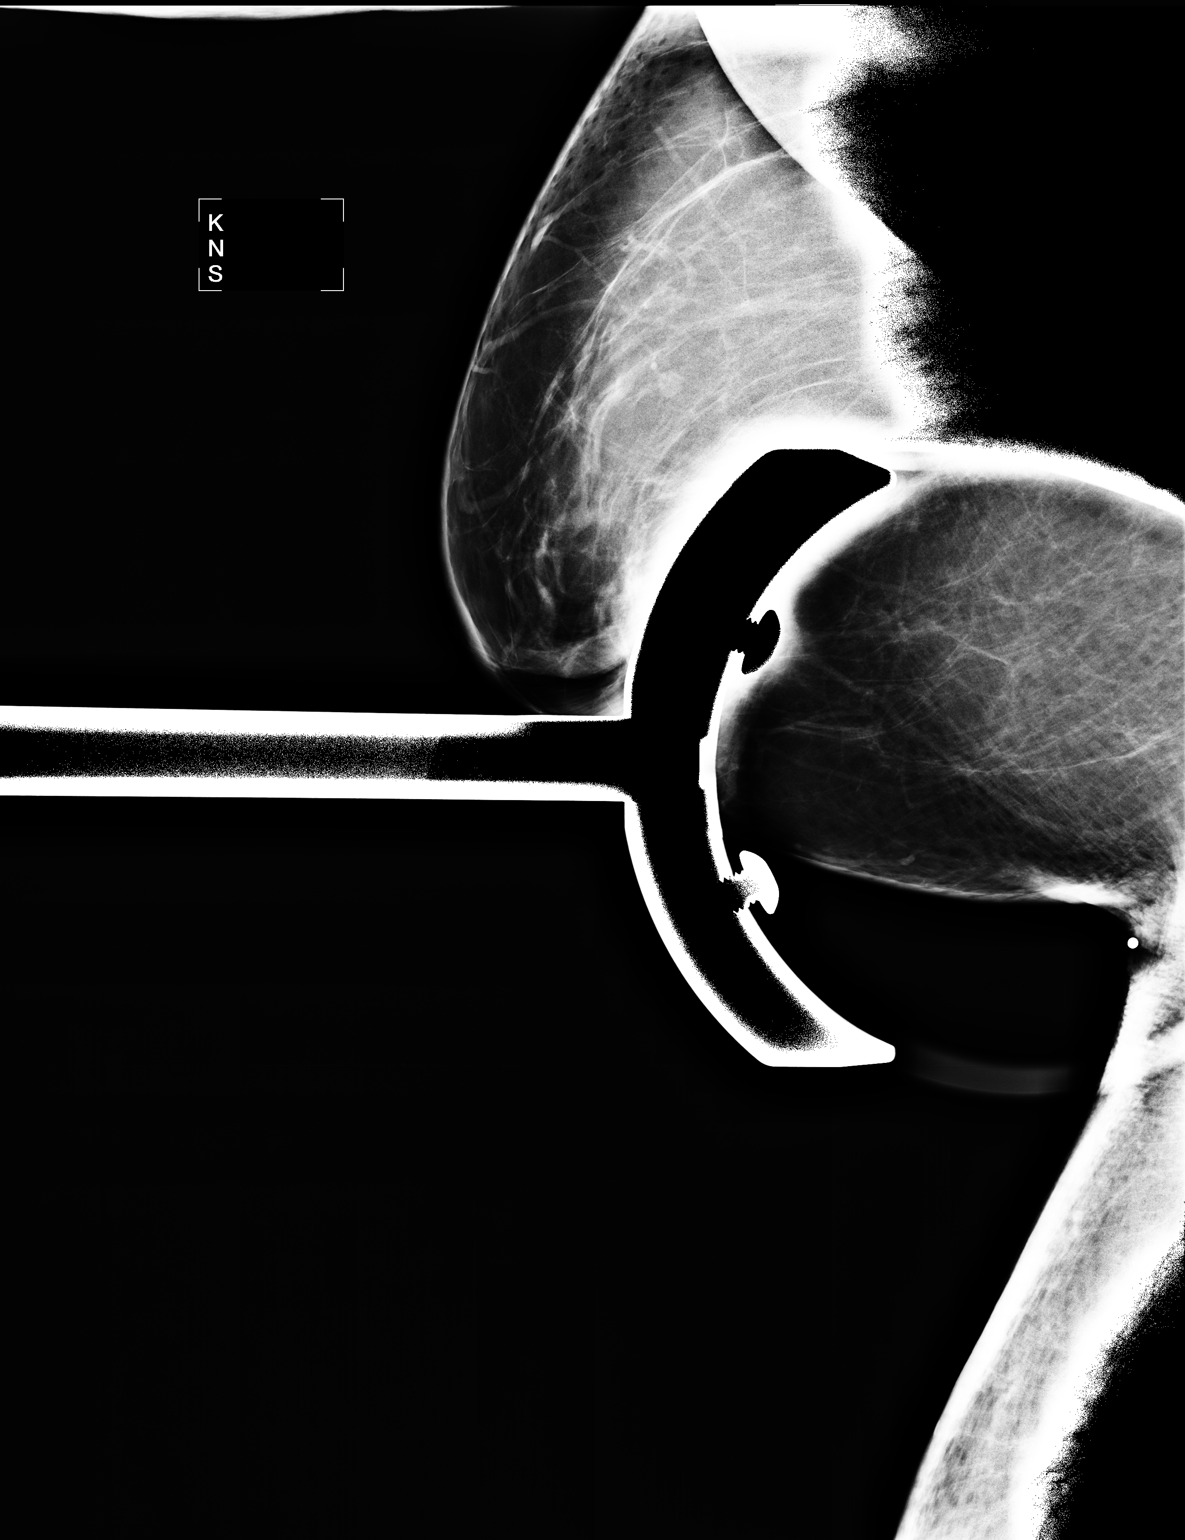

[3 of 3 positions shown; findings below may reference images not displayed]

Comparison to prior films.

Primarily fatty breast tissue is identified.  There is no evidence of mass, architectural 
distortion, or suspicious microcalcifications to suggest malignancy.

On physical examination of the inferior aspect of the right breast, a small moveable nodule in the 
subcutaneous tissues along the right inframammary fold is noted.

Targeted ultrasound of this area demonstrates a 7 x 12 mm well-circumscribed hypoechoic lesion with
posterior through transmission compatible with a sebaceous cyst.  There is no evidence of internal
flow in this lesion.
IMPRESSION: 7 x 12 mm well-circumscribed hypoechoic lesion along the right inframammary fold with physical 
examination and ultrasound findings compatible with a sebaceous cyst.  Recommend annual screening 
mammogram in 3 months.

ASSESSMENT: Benign - BI-RADS 2

Screening mammogram of both breasts in 3 months.
ANALYZED BY COMPUTER AIDED DETECTION. ,

## 2006-08-12 ENCOUNTER — Ambulatory Visit (HOSPITAL_COMMUNITY): Admission: RE | Admit: 2006-08-12 | Discharge: 2006-08-12 | Payer: Self-pay | Admitting: Obstetrics & Gynecology

## 2006-10-10 ENCOUNTER — Ambulatory Visit (HOSPITAL_BASED_OUTPATIENT_CLINIC_OR_DEPARTMENT_OTHER): Admission: RE | Admit: 2006-10-10 | Discharge: 2006-10-10 | Payer: Self-pay | Admitting: Otolaryngology

## 2006-10-10 ENCOUNTER — Encounter: Payer: Self-pay | Admitting: Internal Medicine

## 2006-10-13 ENCOUNTER — Ambulatory Visit: Payer: Self-pay | Admitting: Internal Medicine

## 2006-12-06 ENCOUNTER — Ambulatory Visit: Payer: Self-pay | Admitting: Internal Medicine

## 2006-12-26 ENCOUNTER — Ambulatory Visit: Payer: Self-pay | Admitting: Internal Medicine

## 2007-01-17 ENCOUNTER — Ambulatory Visit: Payer: Self-pay | Admitting: Internal Medicine

## 2007-02-28 ENCOUNTER — Ambulatory Visit: Payer: Self-pay | Admitting: Internal Medicine

## 2007-08-27 ENCOUNTER — Ambulatory Visit: Payer: Self-pay | Admitting: Internal Medicine

## 2007-09-05 ENCOUNTER — Other Ambulatory Visit: Admission: RE | Admit: 2007-09-05 | Discharge: 2007-09-05 | Payer: Self-pay | Admitting: Obstetrics & Gynecology

## 2007-09-05 ENCOUNTER — Ambulatory Visit (HOSPITAL_COMMUNITY): Admission: RE | Admit: 2007-09-05 | Discharge: 2007-09-05 | Payer: Self-pay | Admitting: Obstetrics & Gynecology

## 2008-10-14 ENCOUNTER — Ambulatory Visit (HOSPITAL_COMMUNITY): Admission: RE | Admit: 2008-10-14 | Discharge: 2008-10-14 | Payer: Self-pay | Admitting: Obstetrics & Gynecology

## 2008-11-09 ENCOUNTER — Other Ambulatory Visit: Admission: RE | Admit: 2008-11-09 | Discharge: 2008-11-09 | Payer: Self-pay | Admitting: Obstetrics & Gynecology

## 2010-06-06 ENCOUNTER — Telehealth: Payer: Self-pay | Admitting: Internal Medicine

## 2010-06-06 DIAGNOSIS — G4733 Obstructive sleep apnea (adult) (pediatric): Secondary | ICD-10-CM | POA: Insufficient documentation

## 2010-07-19 ENCOUNTER — Ambulatory Visit: Payer: Self-pay | Admitting: Internal Medicine

## 2010-07-19 DIAGNOSIS — K219 Gastro-esophageal reflux disease without esophagitis: Secondary | ICD-10-CM | POA: Insufficient documentation

## 2010-07-19 DIAGNOSIS — J302 Other seasonal allergic rhinitis: Secondary | ICD-10-CM | POA: Insufficient documentation

## 2010-07-19 DIAGNOSIS — I10 Essential (primary) hypertension: Secondary | ICD-10-CM | POA: Insufficient documentation

## 2010-07-19 DIAGNOSIS — E119 Type 2 diabetes mellitus without complications: Secondary | ICD-10-CM | POA: Insufficient documentation

## 2010-07-19 DIAGNOSIS — F17200 Nicotine dependence, unspecified, uncomplicated: Secondary | ICD-10-CM | POA: Insufficient documentation

## 2010-07-19 DIAGNOSIS — K1120 Sialoadenitis, unspecified: Secondary | ICD-10-CM | POA: Insufficient documentation

## 2010-07-19 DIAGNOSIS — J3089 Other allergic rhinitis: Secondary | ICD-10-CM

## 2011-01-04 ENCOUNTER — Other Ambulatory Visit
Admission: RE | Admit: 2011-01-04 | Discharge: 2011-01-04 | Payer: Self-pay | Source: Home / Self Care | Admitting: Obstetrics & Gynecology

## 2011-01-16 ENCOUNTER — Ambulatory Visit (HOSPITAL_COMMUNITY)
Admission: RE | Admit: 2011-01-16 | Discharge: 2011-01-16 | Payer: Self-pay | Source: Home / Self Care | Attending: *Deleted | Admitting: *Deleted

## 2011-01-16 IMAGING — MG MM DIGITAL SCREENING
4 series · 4 of 4 positions shown · non-contrast
Comparison: none

DG SCREEN MAMMOGRAM BILATERAL
Bilateral CC and MLO view(s) were taken.

DIGITAL SCREENING MAMMOGRAM WITH CAD:
There are scattered fibroglandular densities.  No masses or malignant type calcifications are 
identified.  Compared with prior studies.
Images were processed with CAD.

[L CC]
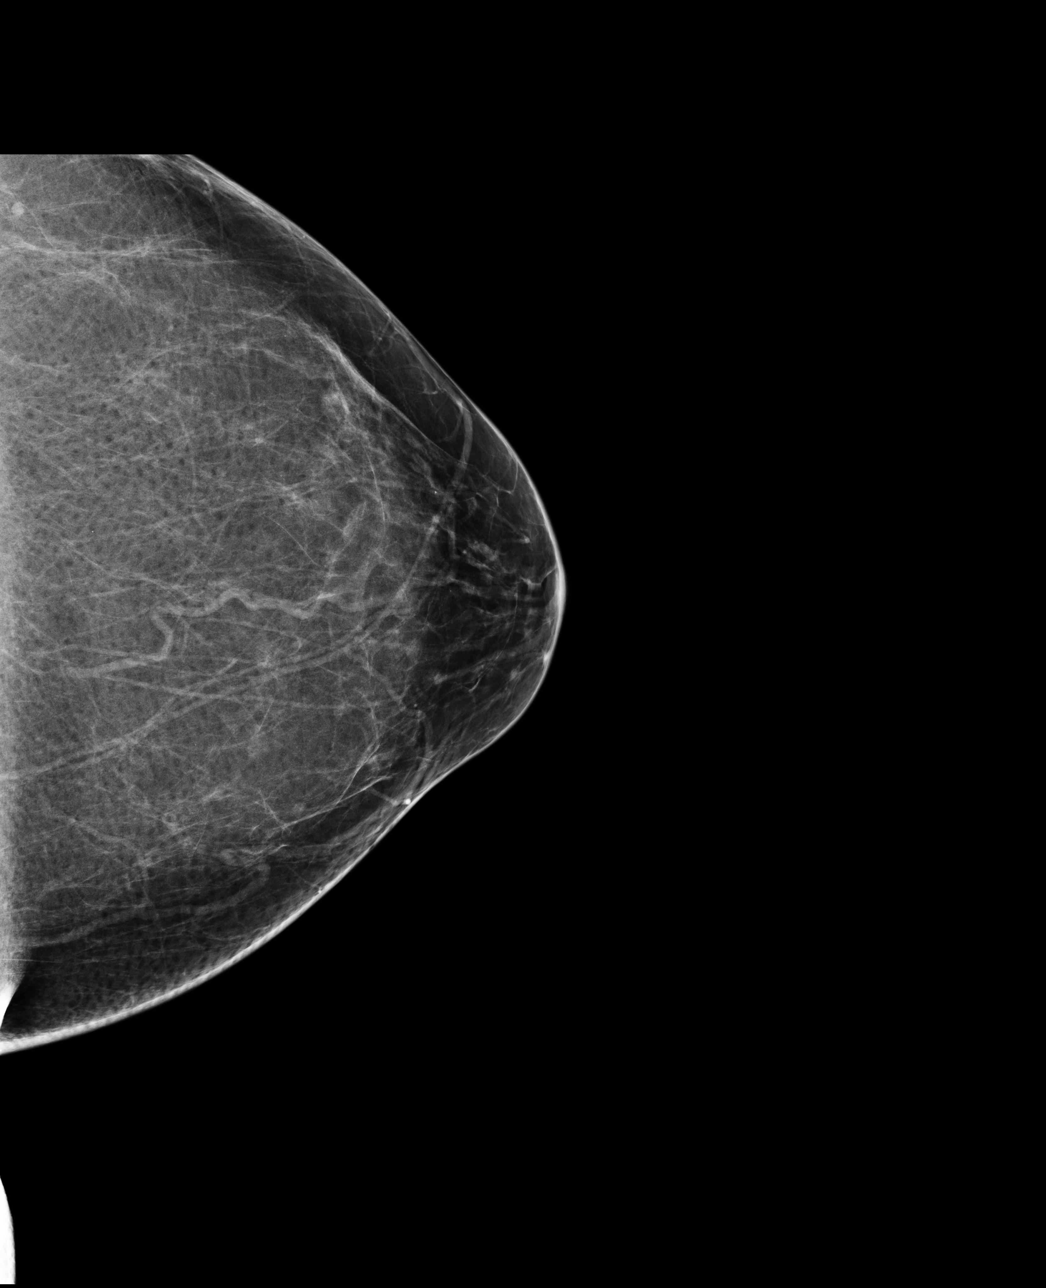

[L MLO]
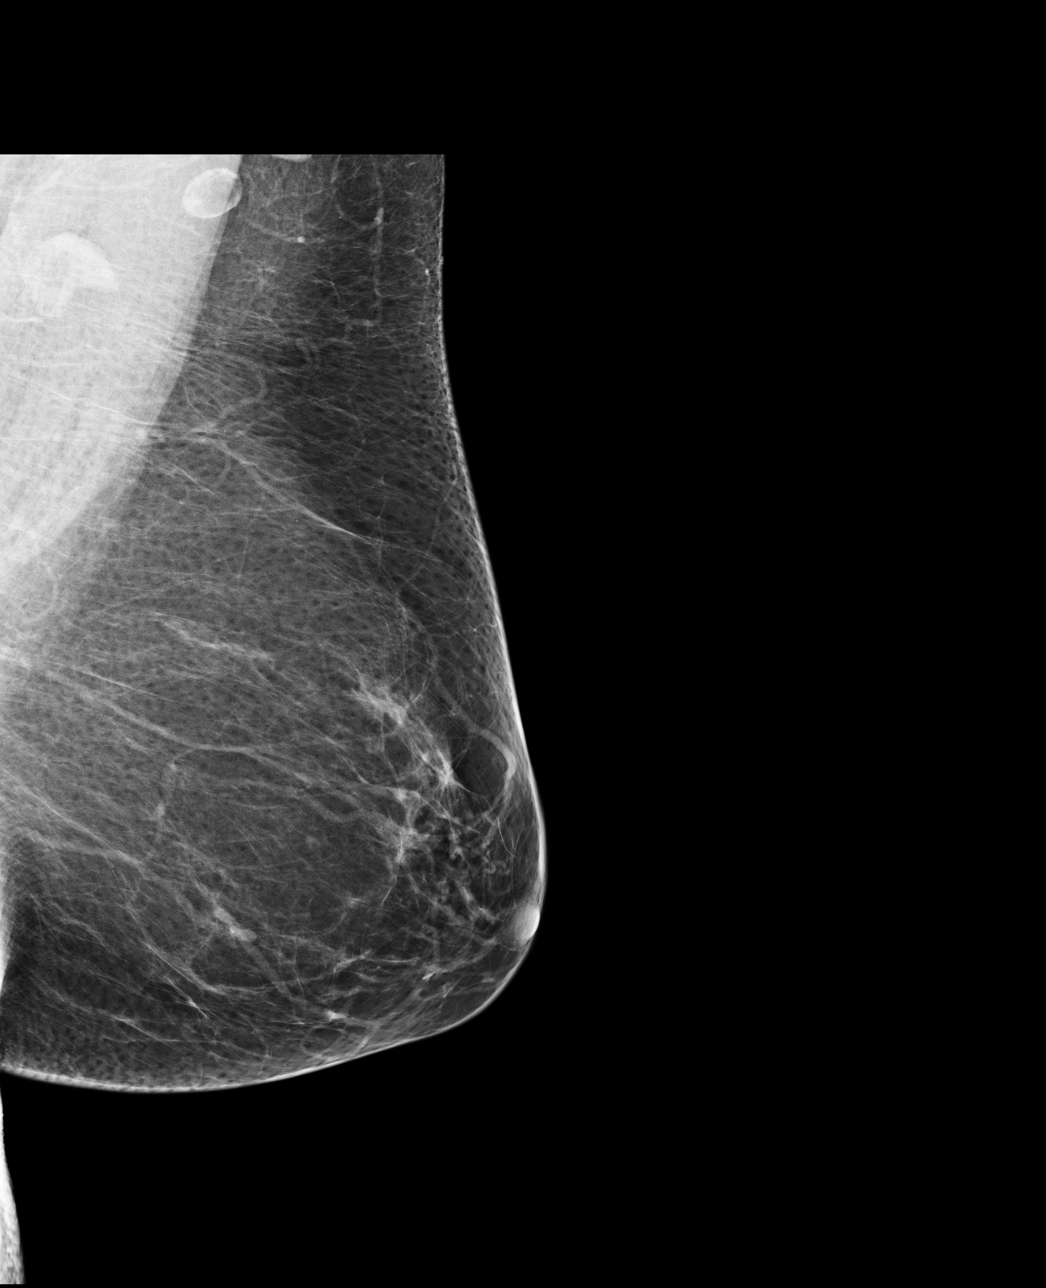

[R CC]
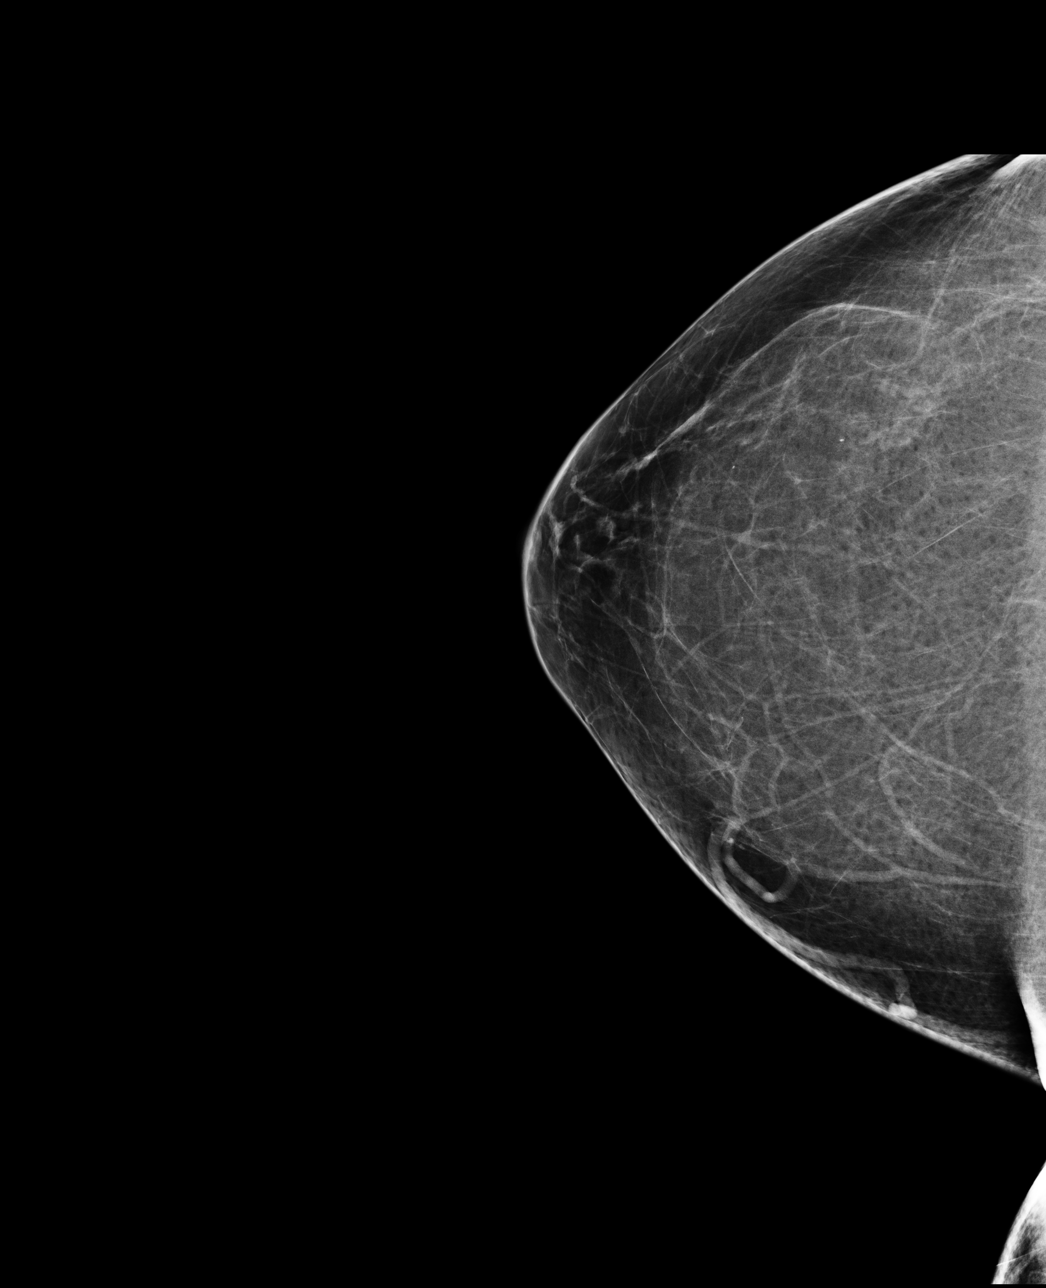

[R MLO]
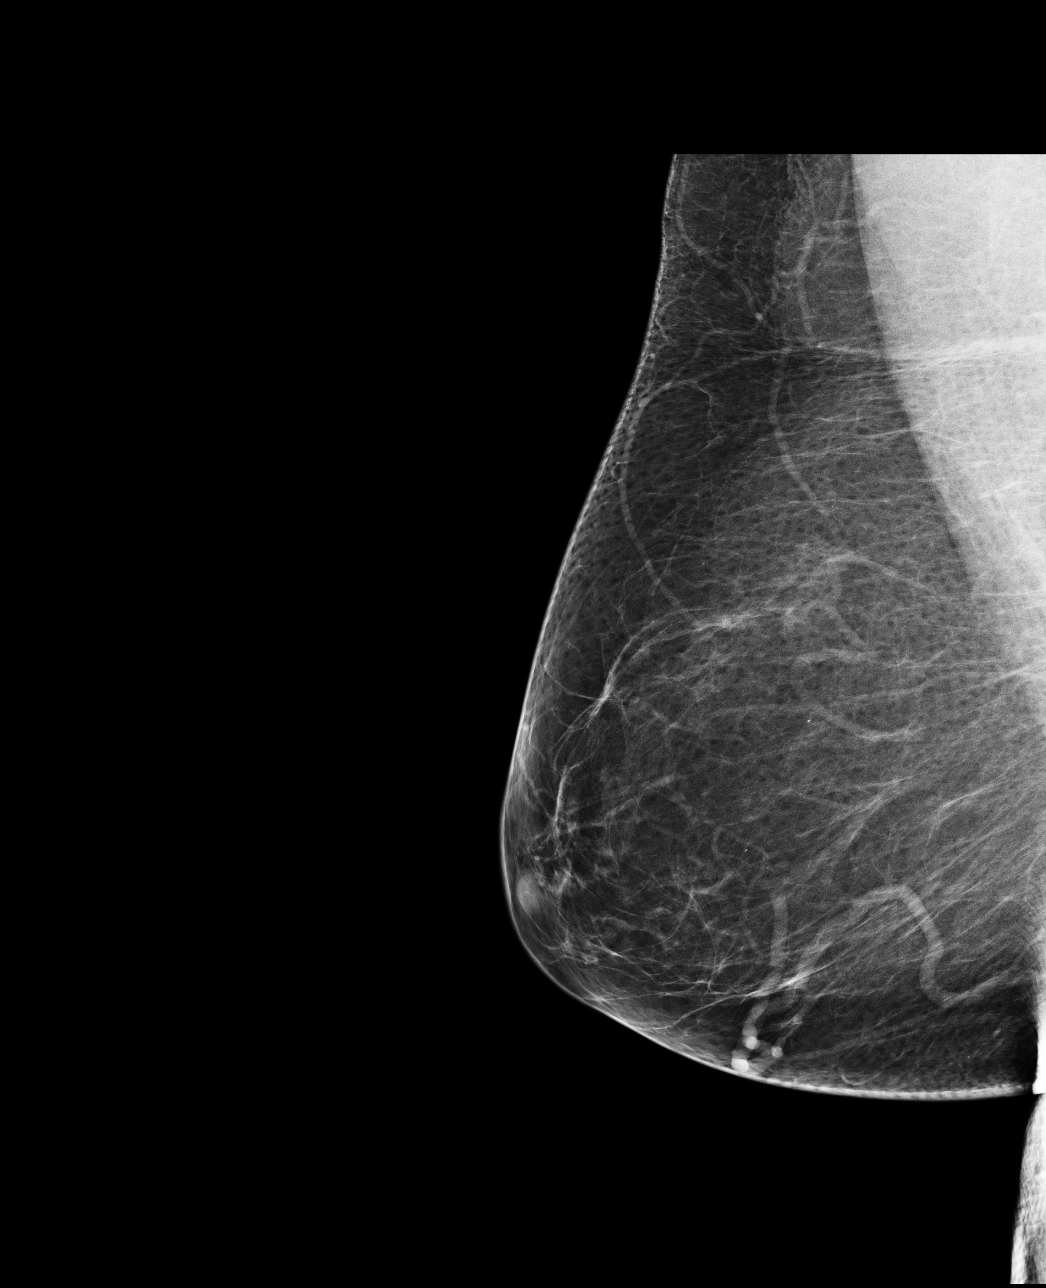

[4 of 4 positions shown; findings below may reference images not displayed]

IMPRESSION: No specific mammographic evidence of malignancy.  Next screening mammogram is recommended in one 
year.

A result letter of this screening mammogram will be mailed directly to the patient.

ASSESSMENT: Negative - BI-RADS 1

Screening mammogram in 1 year.
,

## 2011-02-01 NOTE — Letter (Signed)
Summary: CMN for CPAP Supplies/[facility]  CMN for CPAP Supplies/[facility]   Imported By: Phillis Knack 07/26/2010 08:38:50  _____________________________________________________________________  External Attachment:    Type:   Image     Comment:   External Document
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
## 2011-02-01 NOTE — Progress Notes (Signed)
Summary: Script for CPAP supplies  Phone Note Other Incoming   Summary of Call: [name] [name] asks updated Rx for CPAP supplies. Will do this, but she hasn't been in since 2008 and will need ROV  Follow-up for Phone Call        Left message for pt to call me to arrange appt with CDY for CPAP follow up.[name] [name] CMA  June 07, 2010 2:13 PM   Recieved request from [facility] for pts CPAP supplies; spoke with pharmacy-aware that pt needs to schedule appt with CDY-they are trying to reach pt as well. [name] [name] CMA  June 12, 2010 9:04 AM   Additional Follow-up for Phone Call Additional follow up Details #1::        Pt scheduled for 07-19-10 to see CDY; order sent to [facility].[name] [name] CMA  June 14, 2010 2:21 PM n  New Problems: HYPERSOMNIA WITH SLEEP APNEA UNSPECIFIED (ICD-780.53)   New Problems: HYPERSOMNIA WITH SLEEP APNEA UNSPECIFIED (ICD-780.53) New/Updated Medications: * CPAP 9 [name] [name]  * REPLACEMENT CPAP SUPPLIES

## 2011-05-15 NOTE — Assessment & Plan Note (Signed)
[name] [name]                             PULMONARY OFFICE NOTE   NAME:[name] [name]                   MRN:          [id number]  DATE:08/27/2007                            DOB:          1941/02/08    PROBLEMS:  1. Obstructive sleep apnea with insomnia.  2. Dyspnea.  3. Allergic rhinitis.  4. Hypertension.  5. Diabetes.  6. Esophageal reflux.   HISTORY:  She has been told she is a borderline diabetic, treated with  diet.  Says life is better with CPAP now 9 CWP but sometimes she cannot  sleep, meaning that she has difficulty initiating sleep.  Usually once  asleep, she seems to do okay.  No acute change or events.  We discussed  CPAP comfort and alternative therapies.   MEDICATIONS:  1. CPAP 9 CWP.  2. Wellbutrin 150 mg.  3. Norvasc 5 mg.  4. Tricor 145 mg.  5. Vytorin 10/20.  6. Aricept 5 mg.  7. Flonase.   ALLERGIES:  None.   OBJECTIVE:  VITAL SIGNS:  Weight 182 pounds which is stable, blood  pressure 122/72, pulse 69, room air saturation 98%.  GENERAL APPEARANCE:  She is overweight, alert.  HEENT:  No pressure marks on her face from the mask.  No evident nasal  congestion.  LUNGS:  Breathing is unlabored.  Chest is quiet.  CARDIOVASCULAR:  Heart sounds normal.  EXTREMITIES:  No tremor.   IMPRESSION:  Obstructive sleep apnea seems adequately treated.  She may  have some mild incidental insomnia occasionally but it does not sound as  if it needs specific therapy.   PLAN:  Continue present therapy, schedule return one year but earlier  p.r.n.     [name] D. Annamaria Boots, MD, [name] [name], [name] [name]  Electronically Signed    CDY/MedQ  DD: 09/01/2007  DT: 09/01/2007  Job #: [id number]   cc:   [name] [name] [name] [name], M.D.

## 2011-05-18 NOTE — H&P (Signed)
[facility]  Patient:    [name] [name], [name] [name]                     MRN: [id number] Attending:  Harrold Donath, M.D. CC:         [facility]                         History and Physical  HISTORY OF PRESENT ILLNESS:   This is a 70 year old female para 1-0-1 who is admitted to the hospital for hysteroscopy and D&C because of an endometrial stripe of 4 mm. We were unable to do a hysterosonogram but on ultrasound it looked like she had a ______. She had fluid in the endometrial cavity and she has not had a period since 1986. Therefore she is admitted for evaluation. The patient has had no bleeding or spotting. She may have some cramps in the right lower quadrant which could be uterine cramps, but we are not sure.  The patient does not smoke, she does not smoke but she does use caffeine. She exercises regularly. She looses her urine when she coughs or sneezes which is probably in relation of her weight and some bladder relaxation.  MEDICATIONS:                  Prempro 2.5 q.d. She takes Fosamax q.d.  She takes ibuprofen 400 mg q.d. She takes vitamin E and she takes calcium.  She has been on Fosamax because of a low bone density. The bone density in her lumbar spine is 2.3, standard deviation is below a young female and in her hip 1.4, standard deviations for a young female.  REVIEW OF SYSTEMS:            She has no problems at the present time except the pain she has occasionally in the right lower quadrant.  PAST PERSONAL HISTORY:        She has had ulcers in the past.  FAMILY HISTORY:               Reveals her grandparents had heart disease. Her mother had high blood pressure. She had a grandparent and aunt that had colon cancer and grandmother and mother have osteoporosis. She had a colonoscopy 5 years ago.  PAST SURGICAL HISTORY:        She had a tubal ligation. She has had surgery on her left cheek bone.  PHYSICAL  EXAMINATION:  GENERAL:                      Well-developed, overweight female in no acute distress. She is a well-nourished female oriented and alert.  VITAL SIGNS:                  Blood pressure 110/68, weight is 188. Her height is 5 feet 3-1/2.  NECK:                         Thyroid is not palpable.  HEENT:                        Throat is within normal limits.  LUNGS:                        Clear to percussion and auscultation.  HEART:  Normal sinus rhythm without gallops or murmurs.  BREASTS:                      Type 4 without masses.  ABDOMEN:                      Liver is not enlarged. Spleen is not enlarged. There is no hernia that I can determine.  PELVIC:                       External genitalia within normal limits. Urethra, she has no prolapse, there is no scarring. In the vagina, she has no estrogen effect that I can tell. Discharge is negative. She has a cystocele -2, rectocele 0. The cervix is epithelialized. The uterus is anterior and it is hard to evaluate its size because of her size and the ovary is an inadequate exam.  On ultrasound, her uterus is normal size. She has an endometrial stripe with 4 mm fluid filled. Her right and left ovary both have decreased in size.  IMPRESSION:                   ______ thickened endometrium on Prempro 2.5, no bleeding. Fosamax for severe osteopenia, increased weight.  DISPOSITION:                  Admit for hysteroscopy and D&C. DD:  07/25/00 TD:  07/29/00 Job: [id number]

## 2011-05-18 NOTE — Assessment & Plan Note (Signed)
[name] [name]                             PULMONARY OFFICE NOTE   NAME:[name], [name] BALDNER                   MRN:          [id number]  DATE:01/19/2007                            DOB:          1941-03-24    PULMONARY/SLEEP MEDICINE FOLLOWUP   PROBLEM:  1. Obstructive sleep apnea with insomnia.  2. Dyspnea.  3. Allergic rhinitis.  4. Hypertension.  5. Diabetes.   HISTORY:  At last visit we had discussed sleep hygiene and arranged for  an updated CPAP titration.  She has now been titrated to a recommended  pressure of 9 CWP.  We still do not know what her previous pressure was.  Her dyspnea has not changed and is mostly exertional.  She does not  notice wheeze or cough.  When asked about chest pain, she describes  occasional ache at the left side of her neck and in the left biceps,  which do not seem necessarily related and do not seem exertional.  She  is trying to pay more attention to the pattern, and will discuss it with  Dr. Melina [name].   MEDICATIONS:  1. CPAP now to be set at 9 CWP.  2. Wellbutrin XL 150 mg.  3. Norvasc 5 mg.  4. TriCor 145 mg.  5. Vytorin 10/20.  6. Aricept 5 mg.  7. Flonase.   No medication allergy.   OBJECTIVE:  Weight 183 pounds, BP 112/72, pulse regular 76, room air  saturation 98%.  There is trace neck vein distension sitting upright.  No stridor.  LUNGS:  Fields are clear with normal diaphragm excursion.  HEART:  Sounds are regular.  I do not hear murmur or gallop.  There is  no peripheral edema or cyanosis.   PFT:  A PFT done December 27 showed normal lung volume, mild obstructive  airway disease mainly in small airways, with insignificant response to  bronchodilator.  Her diffusion was mildly to moderately reduced at 65%  of predicted.   IMPRESSION:  1. Obstructive sleep apnea for pressure adjustment to 9 centimeters of      water pressure.  2. Dyspnea with reduced diffusion capacity, unexplained.   PLAN:  1. [name] [name] is changing CPAP setting to 9.  2. We are contacting the sleep center for a full printout of her sleep      study with particular interest in her nocturnal oxygen levels.  3. Chest x-ray.  4. Schedule return in 6 weeks.  5. Walk for endurance, but pay attention to pain and dyspnea      associated with exertion.     [name] D. Annamaria Boots, MD, [name] [name], [name] [name]  Electronically Signed    CDY/MedQ  DD: 01/19/2007  DT: 01/19/2007  Job #: [id number]   cc:   [name] [name], M.D.

## 2011-05-18 NOTE — Op Note (Signed)
[facility]  Patient:    [name] [name]                      MRN: [id number] Proc. Date: 07/30/00 Adm. Date:  [id number] Attending:  Perrin Smack                           Operative Report  PREOPERATIVE DIAGNOSIS:       Thickened endometrium and myometrium on ultrasound, the patient on hormone replacement therapy, Prempro.  POSTOPERATIVE DIAGNOSIS:      Thickened endometrium and myometrium on ultrasound, the patient on hormone replacement therapy, Prempro.  OPERATION:                    Dilatation, hysteroscopy, resection of fibroid and resection of polyp.  SURGEON:                      Harrold Donath, M.D.  ASSISTANT:  ANESTHESIA:                   General anesthesia.  PACKS:                        None.  CATHETERS:                    None.  _________________________ 75 cc.   DESCRIPTION OF PROCEDURE:     The patient was carried to the operating room and after general anesthesia she was placed in the lithotomy position.  Her abdomen was draped in a sterile field.  The examination revealed you could not feel the uterus or the ovaries.  The patient was prepped and catheterized.  Weighted speculum was placed in the posterior vagina.  Cervix was grasped with the tenaculum.  It was difficult because the speculum was not quite long enough.  I then dilated the cervix to a #25 and inserted the observation scope and I could see a polyp in the right cornua region and another polyp questionable fibroid.  We then did a curettage and could not remove the polyp in the right cornua region.  We then dilated her to a 33, inserted resectoscope, resected right cornua region and in the process I think, removed a couple of fibroids.  I relooked and could not see the polyp in the right cornua region.  The remainder of the endometrium was okay. She just had the stimulation.  We slowly decreased the pressure and cauterized any areas of  bleeding.  We then watched her for two minutes with the instruments out and she had no excessive bleeding.  The procedure was terminated and the patient was taken to the recovery room in good condition. DD:  07/30/00 TD:  07/31/00 Job: [id number]

## 2011-05-18 NOTE — Assessment & Plan Note (Signed)
[facility]                             PULMONARY OFFICE NOTE   NAME:[name] [name], [name] [name]                   MRN:          [id number]  DATE:02/28/2007                            DOB:          02-28-1941    PULMONARY FOLLOWUP   PROBLEMS:  1. Obstructive sleep apnea with insomnia.  2. Dyspnea.  3. Allergic rhinitis.  4. Hypertension.  5. Diabetes.  6. Esophageal reflux.   HISTORY:  She is retired and says she is under less stress, and  generally feeling better.  She is much happier since we adjusted her  CPAP to 9 CWP, saying it is more comfortable and she sleeps better.  Occasional sharp twinges in the sternum, left lateral ribs, back almost  to mid back, mostly related to movement.  She is aware of a history of  reflux, but does not seem to be feeling it happen.   MEDICATIONS:  1. CPAP at 9 CWP.  2. Wellbutrin XL 150 mg.  3. Norvasc 5 mg.  4. TriCor 145 mg.  5. Vytorin 10/20.  6. Aricept 5 mg.  7. Flonase.   No medication allergy.   OBJECTIVE:  Weight 182 pounds, BP 122/72, pulse regular 72, room air  saturation 99%.  She is somewhat overweight, very alert, apparently comfortable.  There are no pressure marks on her face from the mask.  No significant  nasal congestion.  CHEST:  Quiet and clear.  HEART:  Sounds regular without murmur.   IMPRESSION:  1. Obstructive sleep apnea, doing quite well on continuous positive      airway pressure at 9 cm water pressure.  2. Musculoskeletal pain.   PLAN:  1. She is encouraged to follow up on her hypertension and related      issues with Dr. Melina Copa.  2. Continue CPAP at 9 CWP.  3. Encouraged weight loss.  4. Schedule return in 6 months.  Earlier p.r.n.     Clinton D. Annamaria Boots, MD, Shade Flood, [place]  Electronically Signed    CDY/MedQ  DD: 03/01/2007  DT: 03/01/2007  Job #: [id number]   cc:   Bradly Bienenstock [name], M.D.

## 2011-05-18 NOTE — Procedures (Signed)
[facility]  Patient:    GIAVANA, GATH Visit Number: [id number] MRN: [id number]          Service Type: SUR Location: 3E 0320 01 Attending Physician:  Gomez Cleverly Proc. Date: 08/20/01 Adm. Date:  08/20/2001 Disc. Date: 08/21/2001   CC:         Morrie Sheldon, M.D.  Earnstine Regal, M.D.   Procedure Report  PROCEDURE:  Endoscopic retrograde cholangiopancreatography with sphincterotomy and balloon pull-through.  INDICATION:  Patient with three small CBD stones on intraoperative cholangiogram.  INFORMED CONSENT:  Consent was signed after risks, benefits, methods, and options were thoroughly discussed with both Mr. and Mrs. Carchi and their daughter.  MEDICINES USED:  Fentanyl 50 mcg, Versed 4 mg.  DESCRIPTION OF PROCEDURE:  The side-viewing therapeutic video duodenoscope was inserted by indirect vision into the stomach and advanced through a normal antrum, normal pylorus, into a normal duodenum, and the ampulla was brought into view.  It had a small ampulla with a small ostia.  Using the triple-lumen sphincterotome, we were able to get selected CBD cannulation on the second attempt.  No PD injections were done.  We were unable to get deep selective cannulation; however, on the first two injections, no CBD stones or obvious distal intrahepatic stone was seen and no obvious leak seen coming from the cystic duct remnant, although not much filling was seen there.  Using the [place], we were able to advance deep into the intrahepatics and then obtain deep selective cannulation.  This was done on the second attempt with the wire.  We then went ahead and filled more contrast into the duct and, again, no stone was seen.  Based on the intraoperative cholangiogram, however, we went ahead and did a small to medium sized sphincterotomy until we saw some bilious drainage and were able to easily advance the half to two-thirds bowed sphincterotome  easily in and out of the sphincterotomy site.  We then went ahead and exchanged the sphincterotome for the .5 mm balloon and advanced it to the bifurcation.  The balloon was inflated, and occlusion cholangiogram of the intrahepatics did not reveal any proximal stones.  We then went ahead and proceeded with three balloon pull-throughs over the wire in the customary fashion.  No stones were delivered or any debris, and no stones were seen on subsequent occlusion cholangiograms.  The balloon passed readily through the sphincterotomy site without any resistance.  After the third balloon pull-through, the wire was removed, and the procedure was terminated. Adequate drainage was seen once we removed the scope.  The scope was removed. There was no obvious complication.  The patient tolerated the procedure well.  ENDOSCOPIC DIAGNOSES: 1. Small ostia on ampulla. 2. No pancreas divisum injections. 3. Normal common bile duct and intrahepatics. 4. Small to medium size sphincterotomy done without any bleeding or obvious    complication. 5. An 8.5 mm balloon pull-through x 3 without any stones or resistance    withdrawing through the sphincterotomy site. 6. Adequate drainage postprocedure.  PLAN:  We will observe overnight, watch frequent vital signs.  If doing well in two hours, begin sips of clear liquids.  No aspirin or nonsteroidals for two weeks.  Recheck labs in the a.m. and will go ahead and give her one more dose of cefotetan at 4 a.m. just to be on the safe side but hopefully if doing well and no delayed complications and tolerating p.o., can probably go home tomorrow. Attending Physician:  Gomez Cleverly DD:  08/20/01 TD:  08/21/01 Job: [id number]

## 2011-05-18 NOTE — Procedures (Signed)
[facility]  Patient:    [name] [name], [name] [name] Visit Number: [id number] MRN: [id number]          Service Type: END Location: ENDO Attending Physician:  Orvis Brill Dictated by:   Jeryl Columbia, M.D. Proc. Date: 05/19/02 Admit Date:  05/19/2002   CC:         Clois Dupes, M.D.   Procedure Report  PROCEDURE:  Colonoscopy with polypectomy.  INDICATION:  The patient has a history of colon polyps, family history for colon cancer, due for repeat screening.  Consent was signed after risks, benefits, methods, and options thoroughly discussed in the office.  MEDICATIONS:  Fentanyl 100 mcg, Versed 6 mg.  DESCRIPTION OF PROCEDURE:  Rectal inspection was pertinent for external hemorrhoids, small.  Digital exam was negative.  The video pediatric adjustable colonoscope was inserted, easily advanced around the colon to the cecum.  This did require some abdominal pressure but no position changes.  On insertion, no abnormalities were seen.  The cecum was identified by the appendiceal orifice and the ileocecal valve.  The prep was adequate except for in the cecal pole which had some stool adherent to the wall which could not be washed off but, on slow withdrawal through the colon, adequate visualization was had with some washing and suctioning.  In the ascending, a tiny polyp was seen and was hot biopsied x 1.  At the proximal level of the hepatic flexure in the ascending colon, a sessile, edematous fold was seen and hot biopsied x 2.  This was questionably a polyp, put in a separate container.  The scope was slowly withdrawn.  Other than a tiny descending polyp which was hot biopsied and put in a third container and some occasional left-sided diverticula, no other abnormalities were seen as we slowly withdrew back to the rectum.  Once back in the rectum, the scope was retroflexed, pertinent for some internal hemorrhoids.  The scope was straightened  and readvanced a short ways up the left side of the colon, air was suctioned, and the scope removed. The patient tolerated the procedure well.  There was no obvious immediate complications.  ENDOSCOPIC DIAGNOSES: 1. Internal and external hemorrhoids. 2. Left occasional diverticula. 3. Two tiny to small polyps, hot biopsied in the ascending and descending. 4. Questionable sessile ascending fold polyp, hot biopsied. 5. Otherwise within normal limits to the cecum.  PLAN: 1. Await pathology to determine future screening. 2. Happy to see back p.r.n. 3. Otherwise, return care to Dr. Laurance Flatten for the customary health care    maintenance to include yearly rectals and guaiacs. Dictated by:   Jeryl Columbia, M.D. Attending Physician:  Orvis Brill DD:  05/19/02 TD:  05/20/02 Job: [id number]
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
## 2011-05-18 NOTE — Op Note (Signed)
[facility]  Patient:    [name] [name], [name] [name] Visit Number: [id number] MRN: [id number]          Service Type: SUR Location: 3E 0320 01 Attending Physician:  Gomez Cleverly Proc. Date: 08/20/01 Adm. Date:  08/20/2001   CC:         Jeryl Columbia, M.D.  Clois Dupes, M.D.   Operative Report  PREOPERATIVE DIAGNOSIS:  Symptomatic cholelithiasis.  POSTOPERATIVE DIAGNOSES:  Symptomatic cholelithiasis, choledocholithiasis.  PROCEDURE:  Laparoscopic cholecystectomy with intraoperative cholangiography.  SURGEON:  Earnstine Regal, M.D.  ASSISTANT:  Abbott Pao. March Rummage, M.D.  ANESTHESIA:  General.  ESTIMATED BLOOD LOSS:  Minimal.  PREPARATION:  Betadine.  COMPLICATIONS:  None.  INDICATIONS:  The patient is a 70 year old white female with seen at the request of Dr. Clarene Essex for longstanding right upper quadrant abdominal pain, radiating to the back and right flank.  This is occasionally associated with nausea.  She denies any history of jaundice.  She underwent ultrasound of the abdomen at [facility] on June 06, 2001.  This demonstrated cholelithiasis without wall thickening or paracolic cystic fluid.  Bile ducts were of normal caliber.  A small calculus was noted in the right kidney.  The patient now comes to surgery for cholecystectomy.  FINDINGS:  Intraoperative cholangiography reveals common bile duct stones measuring 2-3 mm in size, numbering approximately 3 in the middle third of the common bile duct.  DESCRIPTION OF PROCEDURE:  The patient is brought to OR #6 at the [facility]  She was placed in the supine position on the operating room table.  Following the administration of general anesthesia, the patient is prepped and draped in the usual strict aseptic fashion.  After ascertaining an adequate level of anesthesia had been obtained, the patients previous infraumbilical incision is reopened with a #15  blade.  Dissection was carried down to the fascia.  The fascia is incised in the midline.  The peritoneal cavity is entered cautiously.  An 0 Vicryl pursestring suture is placed in the fascia.  An Hasson cannula is introduced under direct vision and secured with a pursestring suture.  The abdomen is insufflated with carbon dioxide. Laparoscope was introduced and the abdomen explored.  Operative ports are placed along the right costal margin in the midline, midclavicular line, and anterior axillary line.  Fundus of the gallbladder is grasped and retracted cephalad.  Dissection is begun at the neck of the gallbladder.  Peritoneum is incised. Cystic duct is dissected out along its length.  A clip was placed at the neck of the gallbladder.  The cystic duct is incised with the scissors. Bile emanates from the cystic duct.  A Cook cholangiography catheter is introduced into the right upper quadrant through a stab wound at the costal margin.  Cholangiography catheter is inserted into the cystic duct and secured with a Ligaclip.  Using C-arm fluoroscopy, real-time cholangiography is performed.  There is rapid filling of the common bile duct which appears to be normal caliber.  There is free flow of contrast into the duodenum without obstruction.  There is reflux of contrast into the left and right hepatic ducts.  On close inspection, however, there are three discrete filling defects present within the middle third of the common bile duct.  These are mobile and felt to represent 2-3 mm stones.  The cystic duct is quite long and tortuous, and laparoscopic approach to the common bile duct is felt not to be favorable.  Cholangiography catheter is withdrawn.  The cystic duct was triply clipped and divided.  The cystic artery is dissected out, doubly clipped, and divided.  A posterior branch of the cystic artery is dissected out, doubly clipped, and divided.  Using hook electrocautery, the gallbladder is  excised from the gallbladder bed.  It is placed into an EndoCatch bag and withdrawn through the umbilical port.  The 0 Vicryl pursestring suture is tied securely.  The right upper quadrant is copiously irrigated with warm saline which is evacuated. The ports are removed under direct vision.  Pneumoperitoneum is released.  All port sites are anesthetized with local anesthetic.  All four wounds are closed with interrupted 4-0 Vicryl subcuticular sutures.  The wounds are washed and dried, and Benzoin and Steri-Strips are applied.  Sterile gauze dressings are applied.  The patient is awakened from anesthesia and brought to the recovery room in stable condition.  The patient tolerated the procedure well. Attending Physician:  Gomez Cleverly DD:  08/20/01 TD:  08/20/01 Job: [id number]

## 2011-05-18 NOTE — Procedures (Signed)
NAMELAYLA, [name] [name]            ACCOUNT NO.:  [id number]   MEDICAL RECORD NO.:  [id number]          PATIENT TYPE:  OUT   LOCATION:  SLEEP CENTER                 FACILITY:  [facility]   PHYSICIAN:  Clinton D. Annamaria Boots, MD, FCCP, FACPDATE OF BIRTH:  05/28[birth year]1941   DATE OF STUDY:                              NOCTURNAL POLYSOMNOGRAM   REFERRING PHYSICIAN:  Dr. Jerrell Belfast   INDICATION FOR STUDY:  Hypersomnia with sleep apnea.   EPWORTH SLEEPINESS SCORE:  18/24, BMI 31.2, weight 183 pounds.   HOME MEDICATIONS:  Are listed and reviewed.   The patient has been using CPAP at unknown pressure but has been  symptomatic.  Split protocol evaluation was requested.   SLEEP ARCHITECTURE:  Total sleep time 317 minutes with sleep efficiency 78%.  Stage 1 was 14%, stage 2 82%, stages 3 and 4 were absent, REM 4% of total  sleep time.  Sleep latency 6 minutes, REM latency 103 minutes, awake after  sleep onset 79 minutes, arousal index 19.3.  Vytorin, TriCor, and Aricept  were taken at 2030.   RESPIRATORY DATA:  Split study protocol.  Apnea/hypopnea index (AHI, RDI)  15.6 obstructive events per hour indicating moderate obstructive sleep  apnea/hypopnea syndrome before CPAP.  This included 12 obstructive apneas  and 23 hypopneas before CPAP.  Events were not positional.  REM AHI 46.2.  CPAP was titrated to 9 CWP, AHI 3.9 per hour.  Her own small Respironics  Comfort Classic nasal mask was used with a heated humidifier, and then  changed to a small ResMed Ultra Mirage full face mask because of excessive  leaking and oral venting.   OXYGEN DATA:  Moderate snoring with oxygen desaturation to a nadir of 73%.  After CPAP control, oxygen saturation held 95% on room air.   CARDIAC DATA:  Sinus rhythm with PACs.   MOVEMENT/PARASOMNIA:  Occasional limb jerk with arousal, insignificant.   IMPRESSION/RECOMMENDATION:  1. Mild to moderate obstructive sleep apnea/hypopnea syndrome,      apnea/hypopnea index  15.6 per hour with nonpositional events, moderate      snoring, and oxygen desaturation to a nadir of 73%.  2. Successful CPAP titration to 9 CWP, AHI 3.9 per hour.  A small ResMed      Ultra Mirage full face mask was used with heated humidifier.      Clinton D. Annamaria Boots, MD, [facility], FACP  Diplomate, Tax adviser of Sleep Medicine  Electronically Signed     CDY/MEDQ  D:  10/13/2006 12:27:18  T:  10/14/2006 13:41:38  Job:  [id number]

## 2011-07-19 ENCOUNTER — Encounter: Payer: Self-pay | Admitting: Internal Medicine

## 2011-07-20 ENCOUNTER — Ambulatory Visit (INDEPENDENT_AMBULATORY_CARE_PROVIDER_SITE_OTHER): Payer: Medicare Other | Admitting: Internal Medicine

## 2011-07-20 ENCOUNTER — Encounter: Payer: Self-pay | Admitting: Internal Medicine

## 2011-07-20 VITALS — BP 116/80 | HR 74 | Ht 63.0 in | Wt 172.8 lb

## 2011-07-20 DIAGNOSIS — R0600 Dyspnea, unspecified: Secondary | ICD-10-CM | POA: Insufficient documentation

## 2011-07-20 DIAGNOSIS — R0989 Other specified symptoms and signs involving the circulatory and respiratory systems: Secondary | ICD-10-CM

## 2011-07-20 DIAGNOSIS — G4710 Hypersomnia, unspecified: Secondary | ICD-10-CM

## 2011-07-20 DIAGNOSIS — R0609 Other forms of dyspnea: Secondary | ICD-10-CM

## 2011-07-20 DIAGNOSIS — F17200 Nicotine dependence, unspecified, uncomplicated: Secondary | ICD-10-CM

## 2011-07-20 NOTE — Patient Instructions (Signed)
Schedule PFT  Dr Hunt Oris- follow up CXR  Congrats on stopping smoking  Ask [facility] about replacing old CPAP at 9  We will look for old sleep study

## 2011-07-20 NOTE — Progress Notes (Signed)
Subjective:    Patient ID: [name] [name], female    DOB: 15-Jul-1941, 70 y.o.   MRN: [id number]  HPI 07/20/11- 3 yoF former smoker- just quit in May. Followed for allergic rhinitis, hypersomnia w/ sleep apnea, complicated by DM, HBP, GERD Last here July 19, 2010- note reviewed Says dx'd pneumonia several weeks ago- treated by Dr Brigitte Pulse- they plan f/u CXR. She recognizes age, and gets DOE climbing steps quicker- moving bedroom downstairs. Chest feels tight- gets sore left base of neck- not exertional. Was coughing- stopped after antibiotic.  Has been very compliant with CPAP 9, [facility]. She is noticing dry mouth x 6 months- bad enough to wake her needing to take it off. The machine is old and she will check with [facility] on replacement. Getting tired again in afternoons, blamed on not sleeping with it as much. Has tried Biotene. Not on drying meds.   Review of Systems Constitutional:   No-   weight loss, night sweats, fevers, chills, fatigue, lassitude. HEENT:   No-   headaches, difficulty swallowing, tooth/dental problems, sore throat,                  No-   sneezing, itching, ear ache, nasal congestion, post nasal drip,   CV:  No-   chest pain, orthopnea, PND, swelling in lower extremities, anasarca, dizziness, palpitations  GI:  No-   heartburn, indigestion, abdominal pain, nausea, vomiting, diarrhea,                 change in bowel habits, loss of appetite  Resp:   No-  excess mucus,             No-   productive cough,  No non-productive cough,  No-  coughing up of blood.              No-   change in color of mucus.  No- wheezing.    Skin: No-   rash or lesions.  GU: No-   dysuria, change in color of urine, no urgency or frequency.  No- flank pain.  MS:  No-   joint pain or swelling.  No- decreased range of motion.  No- back pain.  Psych:  No- change in mood or affect. No depression or anxiety.  No memory loss.      Objective:   Physical Exam General- Alert, Oriented,  Affect-appropriate, Distress- none acute Skin- rash-none, lesions- none, excoriation- none Lymphadenopathy- none Head- atraumatic            Eyes- Gross vision intact, PERRLA, conjunctivae clear secretions            Ears- Hearing, canals            Nose- Clear, no-Septal dev, mucus, polyps, erosion, perforation             Throat- Mallampati III , mucosa clear , drainage- none, tonsils- atrophic Neck- flexible , trachea midline, no stridor , thyroid nl, carotid no bruit Chest - symmetrical excursion , unlabored           Heart/CV- RRR , no murmur , no gallop  , no rub, nl s1 s2                           - JVD- none , edema- none, stasis changes- none, varices- none           Lung- clear to P&A, but coarse , wheeze- none, cough- none ,  dullness-none, rub- none           Chest wall-  Abd- tender-no, distended-no, bowel sounds-present, HSM- no Br/ Gen/ Rectal- Not done, not indicated Extrem- cyanosis- none, clubbing, none, atrophy- none, strength- nl Neuro- grossly intact to observation         Assessment & Plan:

## 2011-07-22 ENCOUNTER — Encounter: Payer: Self-pay | Admitting: Internal Medicine

## 2011-07-22 NOTE — Assessment & Plan Note (Addendum)
She will contact [facility] about replacing older CPAP machine, leaving pressure at 9.

## 2011-07-22 NOTE — Assessment & Plan Note (Signed)
Will update PFT. Dr Brigitte Pulse will f/u CXR after recent pneumonia

## 2011-07-22 NOTE — Assessment & Plan Note (Signed)
Congratulated and encouraged to stay off cigarettes.

## 2011-08-08 ENCOUNTER — Other Ambulatory Visit: Payer: Self-pay | Admitting: Internal Medicine

## 2011-08-08 DIAGNOSIS — G4710 Hypersomnia, unspecified: Secondary | ICD-10-CM

## 2011-08-08 DIAGNOSIS — G4730 Sleep apnea, unspecified: Secondary | ICD-10-CM

## 2011-08-21 ENCOUNTER — Encounter: Payer: Self-pay | Admitting: Internal Medicine

## 2011-08-21 ENCOUNTER — Ambulatory Visit (INDEPENDENT_AMBULATORY_CARE_PROVIDER_SITE_OTHER): Payer: Medicare Other | Admitting: Internal Medicine

## 2011-08-21 VITALS — BP 130/82 | HR 85 | Ht 64.0 in | Wt 172.0 lb

## 2011-08-21 DIAGNOSIS — R0609 Other forms of dyspnea: Secondary | ICD-10-CM

## 2011-08-21 DIAGNOSIS — R0989 Other specified symptoms and signs involving the circulatory and respiratory systems: Secondary | ICD-10-CM

## 2011-08-21 DIAGNOSIS — R0600 Dyspnea, unspecified: Secondary | ICD-10-CM

## 2011-08-21 DIAGNOSIS — F17200 Nicotine dependence, unspecified, uncomplicated: Secondary | ICD-10-CM

## 2011-08-21 DIAGNOSIS — J309 Allergic rhinitis, unspecified: Secondary | ICD-10-CM

## 2011-08-21 DIAGNOSIS — G4710 Hypersomnia, unspecified: Secondary | ICD-10-CM

## 2011-08-21 LAB — PULMONARY FUNCTION TEST

## 2011-08-21 MED ORDER — TIOTROPIUM BROMIDE MONOHYDRATE 18 MCG IN CAPS: 18.0000 ug | ORAL_CAPSULE | Freq: Every day | RESPIRATORY_TRACT | Status: DC

## 2011-08-21 NOTE — Progress Notes (Signed)
Subjective:    Patient ID: [name] [name], female    DOB: 07/21/1941, 70 y.o.   MRN: [id number]  HPI    Review of Systems     Objective:   Physical Exam        Assessment & Plan:   Subjective:    Patient ID: [name] [name], female    DOB: [birth year], 70 y.o.   MRN: [id number]  HPI 07/20/11- 70 yoF former smoker- just quit in May. Followed for allergic rhinitis, hypersomnia w/ sleep apnea, complicated by DM, HBP, GERD Last here July 19, 2010- note reviewed Says dx'd pneumonia several weeks ago- treated by Dr Brigitte Pulse- they plan f/u CXR. She recognizes age, and gets DOE climbing steps quicker- moving bedroom downstairs. Chest feels tight- gets sore left base of neck- not exertional. Was coughing- stopped after antibiotic.  Has been very compliant with CPAP 9, [facility]. She is noticing dry mouth x 6 months- bad enough to wake her needing to take it off. The machine is old and she will check with [facility] on replacement. Getting tired again in afternoons, blamed on not sleeping with it as much. Has tried Biotene. Not on drying meds.   08/21/11- 70 yoF former smoker- just quit in May. Followed for allergic rhinitis, hypersomnia w/ sleep apnea, complicated by DM, HBP, GERD Has felt better since here in July. Had f/u CXR from Dr Brigitte Pulse after her pneumonia "everything is fine". Denies cough "much better". Has stayed off cigarettes.  CPAP still has old machine, used every night all night at 9. Did get a new humidifier for it- dry mouth is improved. New mask is full face.  PFT- 08/21/11- minimal obstruction without response to bronchodilator, mild restriction, DLCO mildly reduced. FEV1/FVC 0.76 small airway flows 0.64 before bronchodilator. Aware that her stamina is not good- DOE on hills and stairs, but without chest pain palpitation or swelling. .  Review of Systems Constitutional:   No-   weight loss, night sweats, fevers, chills, fatigue, lassitude. HEENT:   No-   headaches, difficulty  swallowing, tooth/dental problems, sore throat,                  No-   sneezing, itching, ear ache, nasal congestion, post nasal drip,  CV:  No-   chest pain, orthopnea, PND, swelling in lower extremities, anasarca, dizziness, palpitations GI:  No-   heartburn, indigestion, abdominal pain, nausea, vomiting, diarrhea,                 change in bowel habits, loss of appetite Resp:   No-  excess mucus,             No-   productive cough,  No non-productive cough,  No-  coughing up of blood.              No-   change in color of mucus.  No- wheezing.   Skin: No-   rash or lesions. GU: No-   dysuria, change in color of urine, no urgency or frequency.  No- flank pain. MS:  No-   joint pain or swelling.  No- decreased range of motion.  No- back pain. Psych:  No- change in mood or affect. No depression or anxiety.  No memory loss.      Objective:   Physical Exam General- Alert, Oriented, Affect-appropriate, Distress- none acute Skin- rash-none, lesions- none, excoriation- none Lymphadenopathy- none Head- atraumatic            Eyes- Gross vision  intact, PERRLA, conjunctivae clear secretions            Ears- Hearing, canals-normal            Nose- Clear, no-Septal dev, mucus, polyps, erosion, perforation             Throat- Mallampati III , mucosa clear , drainage- none, tonsils- atrophic Neck- flexible , trachea midline, no stridor , thyroid nl, carotid no bruit Chest - symmetrical excursion , unlabored           Heart/CV- RRR , no murmur , no gallop  , no rub, nl s1 s2                           - JVD- none , edema- none, stasis changes- none, varices- none           Lung-  Coarse airflow again noted, without cough or wheeze ,, dullness-none, rub- none           Chest wall-  Abd- tender-no, distended-no, bowel sounds-present, HSM- no Br/ Gen/ Rectal- Not done, not indicated Extrem- cyanosis- none, clubbing, none, atrophy- none, strength- nl Neuro- grossly intact to observation          Assessment & Plan:

## 2011-08-21 NOTE — Assessment & Plan Note (Signed)
Good compliance and control 

## 2011-08-21 NOTE — Progress Notes (Signed)
PFT done today. 

## 2011-08-21 NOTE — Patient Instructions (Signed)
I encourage you to keep active- walking and any other exercise you can do to help keep weight down and stamina up.  Sample and script Spiriva 1 daily  For shortness of breath

## 2011-08-21 NOTE — Assessment & Plan Note (Addendum)
She does expect fall seasonal pollen to be a problem soon. She has antihistamines and we can add a nasal steroid if necessary.

## 2011-08-21 NOTE — Assessment & Plan Note (Signed)
Resolved and remains abstinent from tobacco. Minimal PFT changes probably reflect subclinical COPD now with preserved airflow.

## 2011-08-26 NOTE — Assessment & Plan Note (Signed)
Most of her exertional dyspnea is probably do to her weight and conditioning. Regular walking and aerobic exercise is encouraged.

## 2011-12-19 ENCOUNTER — Ambulatory Visit (INDEPENDENT_AMBULATORY_CARE_PROVIDER_SITE_OTHER): Payer: Medicare Other | Admitting: Internal Medicine

## 2011-12-19 ENCOUNTER — Encounter: Payer: Self-pay | Admitting: Internal Medicine

## 2011-12-19 VITALS — BP 138/72 | HR 91 | Ht 64.0 in | Wt 176.4 lb

## 2011-12-19 DIAGNOSIS — J309 Allergic rhinitis, unspecified: Secondary | ICD-10-CM

## 2011-12-19 DIAGNOSIS — G4730 Sleep apnea, unspecified: Secondary | ICD-10-CM

## 2011-12-19 DIAGNOSIS — G4710 Hypersomnia, unspecified: Secondary | ICD-10-CM

## 2011-12-19 NOTE — Patient Instructions (Signed)
Continue  CPAP 9   Please call as needed

## 2011-12-19 NOTE — Progress Notes (Signed)
Patient ID: [name] [name], female    DOB: Mar 31, 1941, 70 y.o.   MRN: [id number]  HPI 07/20/11- 83 yoF former smoker- just quit in May. Followed for allergic rhinitis, hypersomnia w/ sleep apnea, complicated by DM, HBP, GERD Last here July 19, 2010- note reviewed Says dx'd pneumonia several weeks ago- treated by Dr Brigitte Pulse- they plan f/u CXR. She recognizes age, and gets DOE climbing steps quicker- moving bedroom downstairs. Chest feels tight- gets sore left base of neck- not exertional. Was coughing- stopped after antibiotic.  Has been very compliant with CPAP 9, [facility]. She is noticing dry mouth x 6 months- bad enough to wake her needing to take it off. The machine is old and she will check with [facility] on replacement. Getting tired again in afternoons, blamed on not sleeping with it as much. Has tried Biotene. Not on drying meds.   08/21/11- 27 yoF former smoker- just quit in May. Followed for allergic rhinitis, hypersomnia w/ sleep apnea, complicated by DM, HBP, GERD Has felt better since here in July. Had f/u CXR from Dr Brigitte Pulse after her pneumonia "everything is fine". Denies cough "much better". Has stayed off cigarettes.  CPAP still has old machine, used every night all night at 9. Did get a new humidifier for it- dry mouth is improved. New mask is full face.  PFT- 08/21/11- minimal obstruction without response to bronchodilator, mild restriction, DLCO mildly reduced. FEV1/FVC 0.76 small airway flows 0.64 before bronchodilator. Aware that her stamina is not good- DOE on hills and stairs, but without chest pain palpitation or swelling.  12/19/11-  73 yoF former smoker- just quit in May, 2012. Followed for allergic rhinitis, hypersomnia w/ sleep apnea, complicated by DM, HBP, GERD Had flu vax. Doing well with her chronic issues.  CPAP- 9- Good compliance and control, used all night every night. Got new machine.  No significant allergy complaints in this season.   .  Review of  Systems Constitutional:   No-   weight loss, night sweats, fevers, chills, fatigue, lassitude. HEENT:   No-   headaches, difficulty swallowing, tooth/dental problems, sore throat,                  No-   sneezing, itching, ear ache, nasal congestion, post nasal drip,  CV:  No-   chest pain, orthopnea, PND, swelling in lower extremities, anasarca, dizziness, palpitations GI:  No-   heartburn, indigestion, abdominal pain, nausea, vomiting, diarrhea,                 change in bowel habits, loss of appetite Resp:   No-  excess mucus,             No-   productive cough,  No non-productive cough,  No-  coughing up of blood.              No-   change in color of mucus.  No- wheezing.   Skin: No-   rash or lesions. GU:  MS:  No-   joint pain or swelling.  No- decreased range of motion.  No- back pain. Psych:  No- change in mood or affect. No depression or anxiety.  No memory loss.      Objective:   Physical Exam General- Alert, Oriented, Affect-appropriate, Distress- none acute, overweight Skin- rash-none, lesions- none, excoriation- none Lymphadenopathy- none Head- atraumatic            Eyes- Gross vision intact, PERRLA, conjunctivae clear secretions  Ears- Hearing, canals-normal            Nose- Clear, no-Septal dev, mucus, polyps, erosion, perforation             Throat- Mallampati III-IV , mucosa clear , drainage- none, tonsils- atrophic Neck- flexible , trachea midline, no stridor , thyroid nl, carotid no bruit Chest - symmetrical excursion , unlabored           Heart/CV- RRR , no murmur , no gallop  , no rub, nl s1 s2                           - JVD- none , edema- none, stasis changes- none, varices- none           Lung- Clear to P&A, without cough or wheeze ,, dullness-none, rub- none           Chest wall-  Abd- tender-no, distended-no, bowel sounds-present, HSM- no Br/ Gen/ Rectal- Not done, not indicated Extrem- cyanosis- none, clubbing, none, atrophy- none, strength-  nl Neuro- grossly intact to observation

## 2011-12-23 NOTE — Assessment & Plan Note (Signed)
Controlled adequately. This is not a problem season usually.,

## 2011-12-23 NOTE — Assessment & Plan Note (Signed)
Doing well, good compliance and control.Sleep hygiene adequate.

## 2012-01-04 DIAGNOSIS — N1330 Unspecified hydronephrosis: Secondary | ICD-10-CM | POA: Diagnosis not present

## 2012-01-04 DIAGNOSIS — R310 Gross hematuria: Secondary | ICD-10-CM | POA: Diagnosis not present

## 2012-01-04 DIAGNOSIS — N952 Postmenopausal atrophic vaginitis: Secondary | ICD-10-CM | POA: Diagnosis not present

## 2012-01-04 DIAGNOSIS — N134 Hydroureter: Secondary | ICD-10-CM | POA: Diagnosis not present

## 2012-01-04 DIAGNOSIS — N200 Calculus of kidney: Secondary | ICD-10-CM | POA: Diagnosis not present

## 2012-01-04 DIAGNOSIS — N201 Calculus of ureter: Secondary | ICD-10-CM | POA: Diagnosis not present

## 2012-02-04 ENCOUNTER — Other Ambulatory Visit (HOSPITAL_COMMUNITY): Payer: Self-pay | Admitting: Internal Medicine

## 2012-02-04 DIAGNOSIS — Z139 Encounter for screening, unspecified: Secondary | ICD-10-CM

## 2012-02-08 ENCOUNTER — Ambulatory Visit (HOSPITAL_COMMUNITY)
Admission: RE | Admit: 2012-02-08 | Discharge: 2012-02-08 | Disposition: A | Discharge status: Home / Self Care | Payer: Medicare Other | Source: Ambulatory Visit | Attending: Internal Medicine | Admitting: Internal Medicine

## 2012-02-08 DIAGNOSIS — Z1231 Encounter for screening mammogram for malignant neoplasm of breast: Secondary | ICD-10-CM | POA: Diagnosis not present

## 2012-02-08 DIAGNOSIS — Z139 Encounter for screening, unspecified: Secondary | ICD-10-CM

## 2012-02-08 IMAGING — MG MM DIGITAL SCREENING BILAT
5 series · 5 of 5 positions shown · non-contrast
Comparison: none

DG SCREEN MAMMOGRAM BILATERAL
Bilateral CC and MLO view(s) were taken.

DIGITAL SCREENING MAMMOGRAM WITH CAD:
There are scattered fibroglandular densities.  No masses or malignant type calcifications are 
identified.  Compared with prior studies.
Images were processed with CAD.

[L CC]
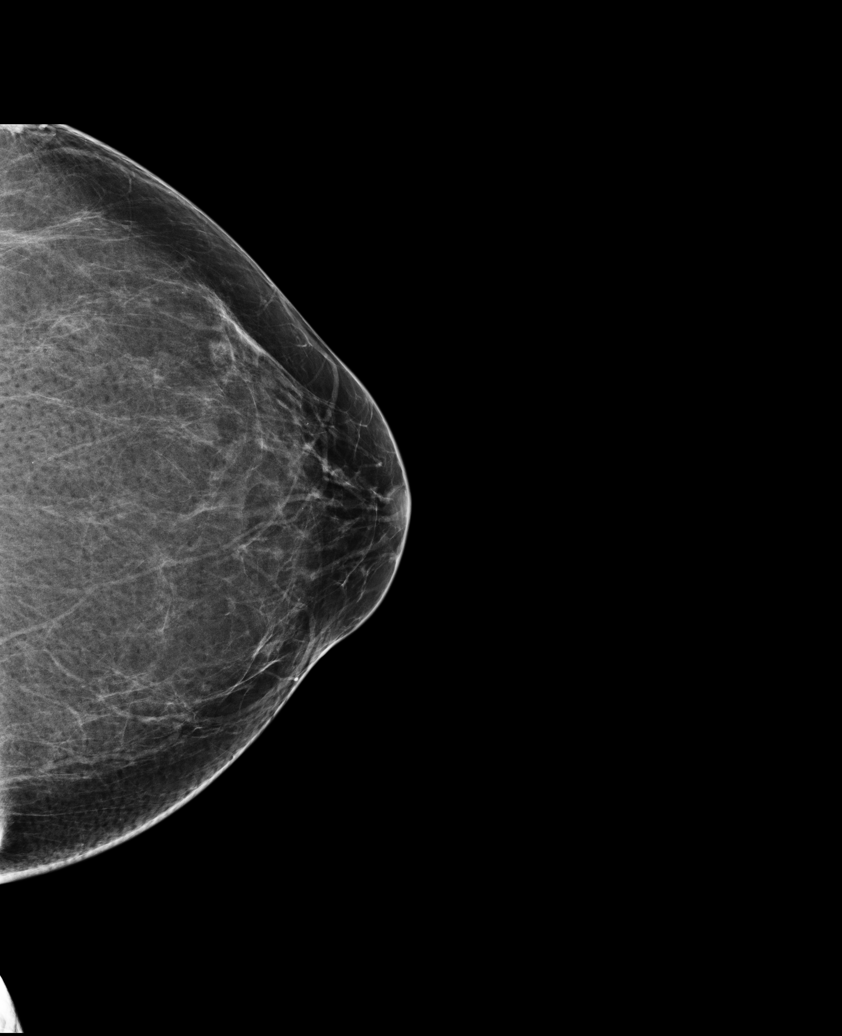

[L MLO (1 of 2)]
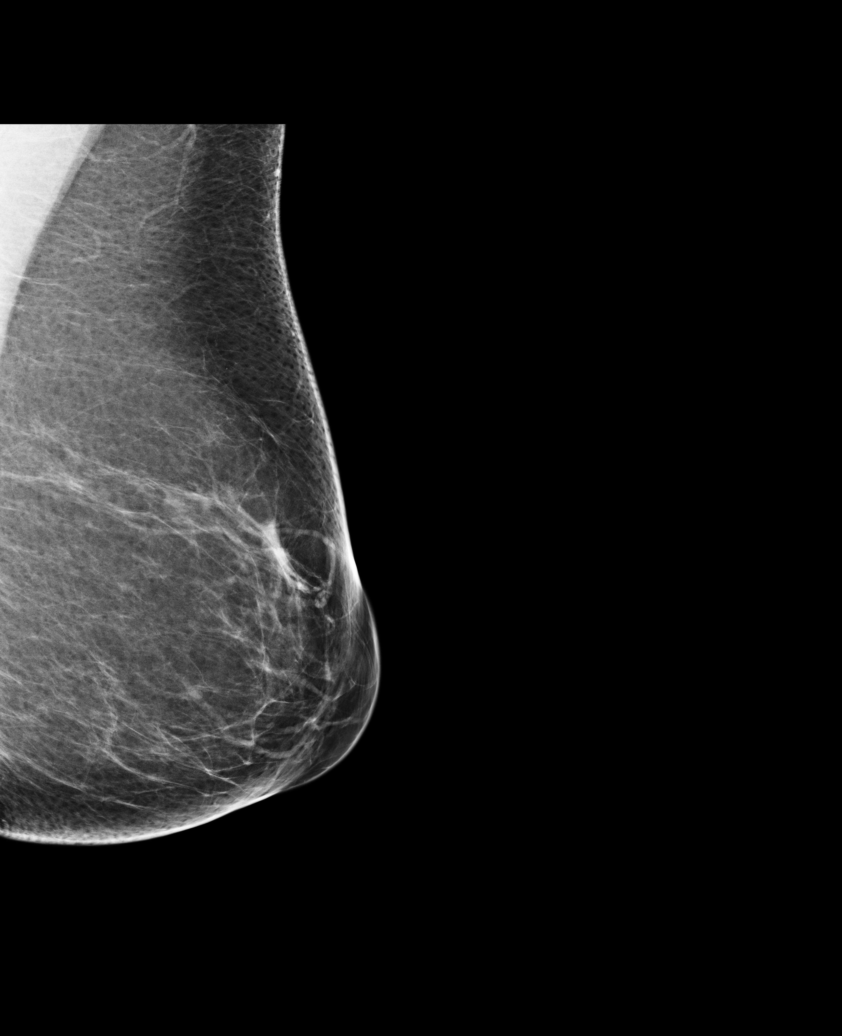

[R CC]
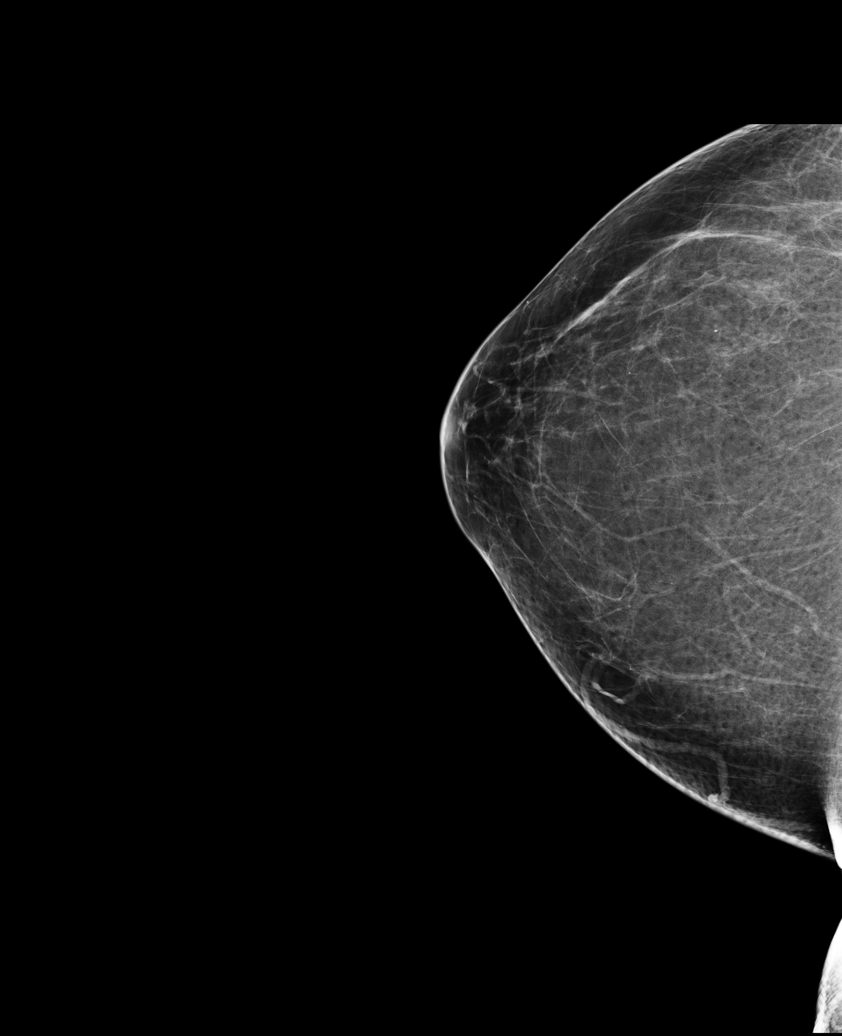

[R MLO]
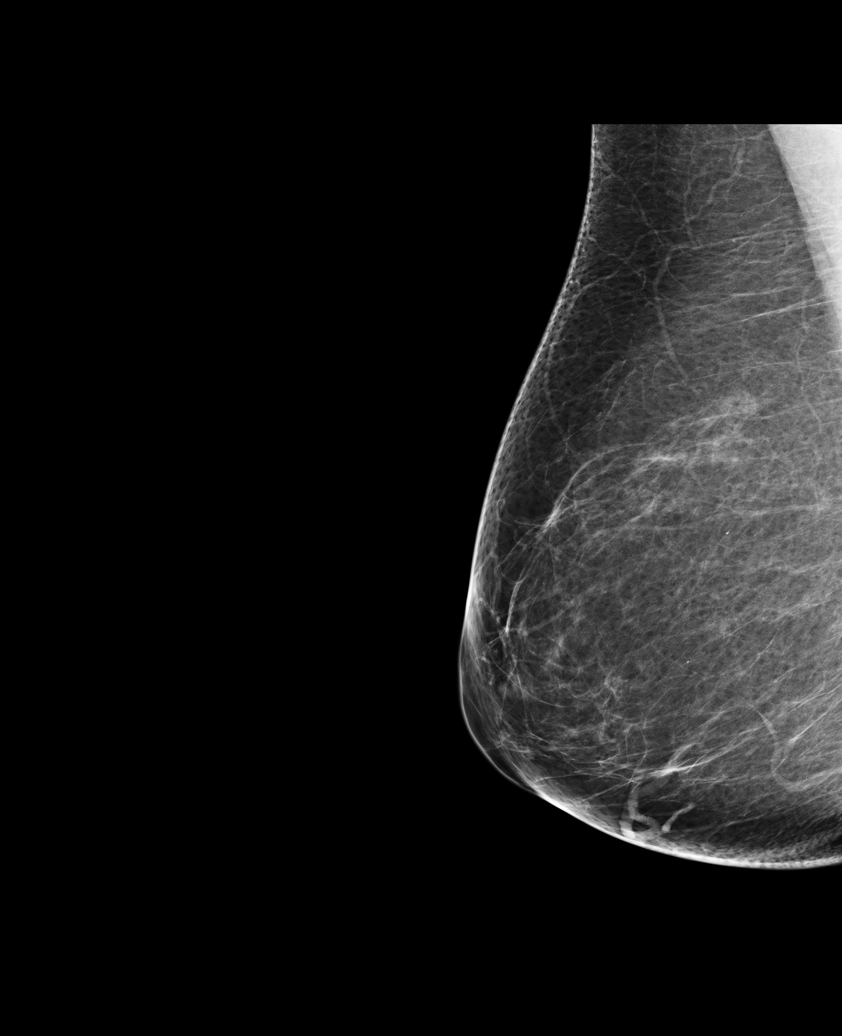

[L MLO (2 of 2)]
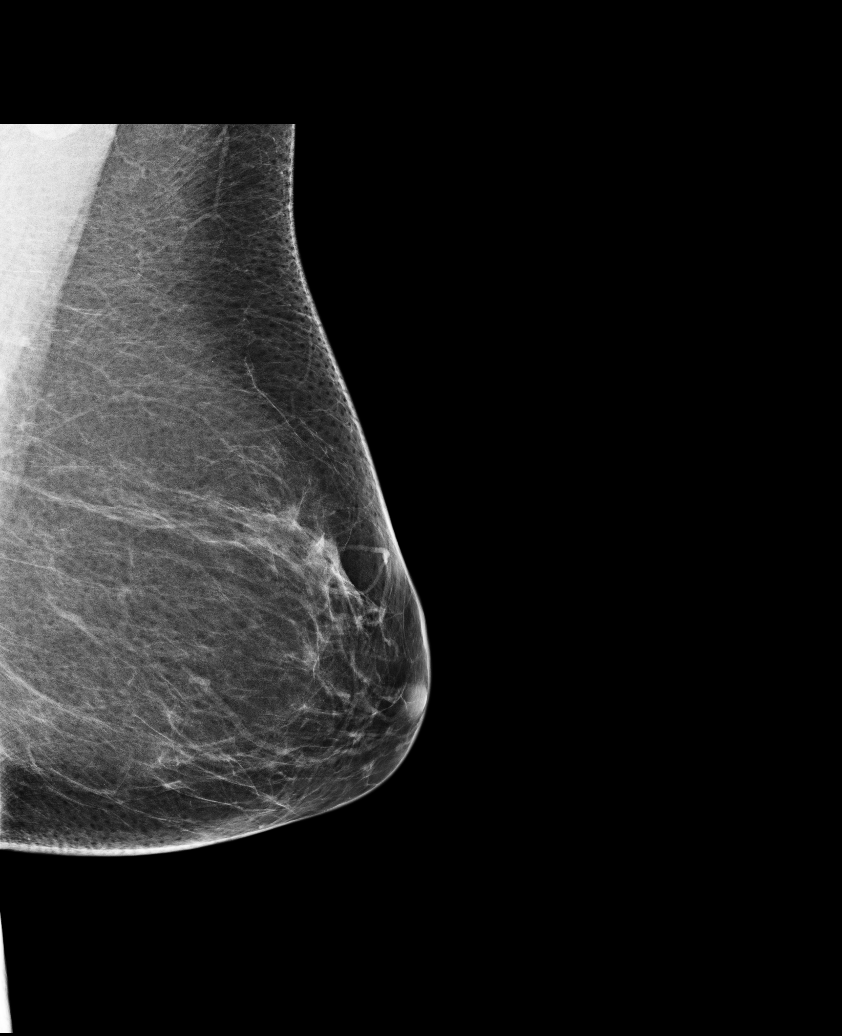

[5 of 5 positions shown; findings below may reference images not displayed]

IMPRESSION: No specific mammographic evidence of malignancy.  Next screening mammogram is recommended in one 
year.

A result letter of this screening mammogram will be mailed directly to the patient.

ASSESSMENT: Negative - BI-RADS 1

Screening mammogram in 1 year.
,

## 2012-02-11 DIAGNOSIS — F329 Major depressive disorder, single episode, unspecified: Secondary | ICD-10-CM | POA: Diagnosis not present

## 2012-02-11 DIAGNOSIS — E785 Hyperlipidemia, unspecified: Secondary | ICD-10-CM | POA: Diagnosis not present

## 2012-02-11 DIAGNOSIS — I10 Essential (primary) hypertension: Secondary | ICD-10-CM | POA: Diagnosis not present

## 2012-02-11 DIAGNOSIS — E1149 Type 2 diabetes mellitus with other diabetic neurological complication: Secondary | ICD-10-CM | POA: Diagnosis not present

## 2012-02-26 DIAGNOSIS — Z0000 Encounter for general adult medical examination without abnormal findings: Secondary | ICD-10-CM | POA: Diagnosis not present

## 2012-03-05 DIAGNOSIS — I10 Essential (primary) hypertension: Secondary | ICD-10-CM | POA: Diagnosis not present

## 2012-03-05 DIAGNOSIS — R82998 Other abnormal findings in urine: Secondary | ICD-10-CM | POA: Diagnosis not present

## 2012-03-05 DIAGNOSIS — R350 Frequency of micturition: Secondary | ICD-10-CM | POA: Diagnosis not present

## 2012-03-12 DIAGNOSIS — R350 Frequency of micturition: Secondary | ICD-10-CM | POA: Diagnosis not present

## 2012-05-14 ENCOUNTER — Other Ambulatory Visit: Payer: Self-pay | Admitting: Dermatology

## 2012-05-14 DIAGNOSIS — C44621 Squamous cell carcinoma of skin of unspecified upper limb, including shoulder: Secondary | ICD-10-CM | POA: Diagnosis not present

## 2012-05-14 DIAGNOSIS — L570 Actinic keratosis: Secondary | ICD-10-CM | POA: Diagnosis not present

## 2012-06-16 DIAGNOSIS — I10 Essential (primary) hypertension: Secondary | ICD-10-CM | POA: Diagnosis not present

## 2012-06-16 DIAGNOSIS — M899 Disorder of bone, unspecified: Secondary | ICD-10-CM | POA: Diagnosis not present

## 2012-06-16 DIAGNOSIS — E1149 Type 2 diabetes mellitus with other diabetic neurological complication: Secondary | ICD-10-CM | POA: Diagnosis not present

## 2012-06-16 DIAGNOSIS — E785 Hyperlipidemia, unspecified: Secondary | ICD-10-CM | POA: Diagnosis not present

## 2012-06-16 DIAGNOSIS — M949 Disorder of cartilage, unspecified: Secondary | ICD-10-CM | POA: Diagnosis not present

## 2012-10-06 DIAGNOSIS — E559 Vitamin D deficiency, unspecified: Secondary | ICD-10-CM | POA: Diagnosis not present

## 2012-10-06 DIAGNOSIS — I10 Essential (primary) hypertension: Secondary | ICD-10-CM | POA: Diagnosis not present

## 2012-10-06 DIAGNOSIS — R82998 Other abnormal findings in urine: Secondary | ICD-10-CM | POA: Diagnosis not present

## 2012-10-06 DIAGNOSIS — E1149 Type 2 diabetes mellitus with other diabetic neurological complication: Secondary | ICD-10-CM | POA: Diagnosis not present

## 2012-10-06 DIAGNOSIS — M899 Disorder of bone, unspecified: Secondary | ICD-10-CM | POA: Diagnosis not present

## 2012-10-06 DIAGNOSIS — M949 Disorder of cartilage, unspecified: Secondary | ICD-10-CM | POA: Diagnosis not present

## 2012-10-06 DIAGNOSIS — E785 Hyperlipidemia, unspecified: Secondary | ICD-10-CM | POA: Diagnosis not present

## 2012-10-13 DIAGNOSIS — I881 Chronic lymphadenitis, except mesenteric: Secondary | ICD-10-CM | POA: Diagnosis not present

## 2012-10-13 DIAGNOSIS — E785 Hyperlipidemia, unspecified: Secondary | ICD-10-CM | POA: Diagnosis not present

## 2012-10-13 DIAGNOSIS — Z0000 Encounter for general adult medical examination without abnormal findings: Secondary | ICD-10-CM | POA: Diagnosis not present

## 2012-10-13 DIAGNOSIS — Z23 Encounter for immunization: Secondary | ICD-10-CM | POA: Diagnosis not present

## 2012-10-13 DIAGNOSIS — I10 Essential (primary) hypertension: Secondary | ICD-10-CM | POA: Diagnosis not present

## 2012-10-13 DIAGNOSIS — R0602 Shortness of breath: Secondary | ICD-10-CM | POA: Diagnosis not present

## 2012-10-13 DIAGNOSIS — E1149 Type 2 diabetes mellitus with other diabetic neurological complication: Secondary | ICD-10-CM | POA: Diagnosis not present

## 2012-10-14 DIAGNOSIS — Z1212 Encounter for screening for malignant neoplasm of rectum: Secondary | ICD-10-CM | POA: Diagnosis not present

## 2012-10-15 ENCOUNTER — Other Ambulatory Visit (HOSPITAL_COMMUNITY): Payer: Self-pay | Admitting: Internal Medicine

## 2012-10-15 DIAGNOSIS — R0602 Shortness of breath: Secondary | ICD-10-CM

## 2012-10-16 ENCOUNTER — Ambulatory Visit (HOSPITAL_COMMUNITY)
Admission: RE | Admit: 2012-10-16 | Discharge: 2012-10-16 | Disposition: A | Discharge status: Home / Self Care | Payer: Medicare Other | Source: Ambulatory Visit | Attending: Internal Medicine | Admitting: Internal Medicine

## 2012-10-16 DIAGNOSIS — R0602 Shortness of breath: Secondary | ICD-10-CM | POA: Insufficient documentation

## 2012-10-16 LAB — PULMONARY FUNCTION TEST

## 2012-10-16 MED ORDER — ALBUTEROL SULFATE (5 MG/ML) 0.5% IN NEBU
2.5000 mg | INHALATION_SOLUTION | Freq: Once | RESPIRATORY_TRACT | Status: AC
  Administered 2012-10-16: 2.5 mg via RESPIRATORY_TRACT

## 2012-10-29 ENCOUNTER — Telehealth: Payer: Self-pay | Admitting: Internal Medicine

## 2012-10-29 NOTE — Telephone Encounter (Signed)
She had a study, filed under Notes, in May, 2012. We don't automatically need a new study unless something has changed. I do want to see her once a year, and her last visit was in Dec, 2012. Please make her a routine ov.

## 2012-10-29 NOTE — Telephone Encounter (Signed)
When do you suggest she have another sleep study? Her last OV was in 12/2011, doesn't have an upcoming appointment. I tried looking for her last sleep study but was not able to find it.  Please advise. Thanks.

## 2012-10-30 ENCOUNTER — Telehealth: Payer: Self-pay | Admitting: Internal Medicine

## 2012-10-30 DIAGNOSIS — G4733 Obstructive sleep apnea (adult) (pediatric): Secondary | ICD-10-CM

## 2012-10-30 NOTE — Telephone Encounter (Signed)
Pt aware and ov set for 12-19-12. [place] Bing, CMA

## 2012-10-30 NOTE — Telephone Encounter (Signed)
Pt states [facility] is no longer accepting her insurance so she is requesting an order be sent to switch to [facility]. Order placed. Pt is aware.[place] Bing, CMA

## 2012-10-31 ENCOUNTER — Telehealth: Payer: Self-pay | Admitting: Internal Medicine

## 2012-10-31 NOTE — Telephone Encounter (Signed)
Called and spoke with [facility] and they will contact [place] and get the info that they need to get the pt the CPAP supplies. [facility] will call back if anything further is needed.  i called and spoke with pt and she is aware of this. Nothing further is needed.

## 2012-11-03 ENCOUNTER — Telehealth: Payer: Self-pay | Admitting: Internal Medicine

## 2012-11-03 NOTE — Telephone Encounter (Signed)
CY, are you okay with sending the requested information? Thanks.

## 2012-11-03 NOTE — Telephone Encounter (Signed)
We can't send update office notes until she has been in. Does [facility] want the note from last visit over a year ago?

## 2012-11-04 NOTE — Telephone Encounter (Signed)
I spoke with [place] at [facility] will be on the watch for patient's chart notes from upcoming appt with CY-nothing needed at this time.

## 2012-11-06 DIAGNOSIS — R300 Dysuria: Secondary | ICD-10-CM | POA: Diagnosis not present

## 2012-11-06 DIAGNOSIS — L940 Localized scleroderma [morphea]: Secondary | ICD-10-CM | POA: Diagnosis not present

## 2012-11-12 DIAGNOSIS — E1149 Type 2 diabetes mellitus with other diabetic neurological complication: Secondary | ICD-10-CM | POA: Diagnosis not present

## 2012-11-12 DIAGNOSIS — I10 Essential (primary) hypertension: Secondary | ICD-10-CM | POA: Diagnosis not present

## 2012-11-17 ENCOUNTER — Telehealth: Payer: Self-pay | Admitting: Internal Medicine

## 2012-11-17 NOTE — Telephone Encounter (Signed)
Returning a call pt will be back by 3:30

## 2012-11-17 NOTE — Telephone Encounter (Signed)
Spoke with pt. She states that she has to get her CPAP through [facility] now, and they are needing her compliance report from the first 90 days that she used the machine. She started out using [facility], then [facility]. She states has used the machine for 10 yrs now. I called [facility] to see if they had a download and they did not. CDY, please advise what to do, will she need another PSG done?

## 2012-11-17 NOTE — Telephone Encounter (Signed)
lmomtcb x1 for pt 

## 2012-11-18 NOTE — Telephone Encounter (Signed)
Pt called back again re: same. [name] [name]  °

## 2012-11-18 NOTE — Telephone Encounter (Signed)
Pt has current machine with [facility]. There never was a 90-day download obtained off of pt's current cpap machine. Proof of compliance wasn't required then. So there is not a 90 day download to obtain. Spoke with [place] at [facility] and she stated that [facility] has been in contact with the patient. Pt has a yearly appointment scheduled on 12/19/12 with Dr. Annamaria Boots. Order can be completed for a replacement cpap at the time of this yearly office visit. Pura Spice stated that patient was aware of this and was ok with this plan.  Called and spoke with patient. She stated that she is fine to wait until her ov with Dr. Annamaria Boots and will proceed with order at that time. Pt stated that she is fine with cpap supplies and will wait until 12/19/12 to place order for her replacement cpap at that time with [facility].  Rhonda J Cobb

## 2012-12-18 DIAGNOSIS — H40009 Preglaucoma, unspecified, unspecified eye: Secondary | ICD-10-CM | POA: Diagnosis not present

## 2012-12-18 DIAGNOSIS — E119 Type 2 diabetes mellitus without complications: Secondary | ICD-10-CM | POA: Diagnosis not present

## 2012-12-18 DIAGNOSIS — H2510 Age-related nuclear cataract, unspecified eye: Secondary | ICD-10-CM | POA: Diagnosis not present

## 2012-12-19 ENCOUNTER — Encounter: Payer: Self-pay | Admitting: Internal Medicine

## 2012-12-19 ENCOUNTER — Ambulatory Visit (INDEPENDENT_AMBULATORY_CARE_PROVIDER_SITE_OTHER): Payer: Medicare Other | Admitting: Internal Medicine

## 2012-12-19 VITALS — BP 122/68 | HR 114 | Ht 64.0 in | Wt 180.0 lb

## 2012-12-19 DIAGNOSIS — G4733 Obstructive sleep apnea (adult) (pediatric): Secondary | ICD-10-CM

## 2012-12-19 DIAGNOSIS — G4710 Hypersomnia, unspecified: Secondary | ICD-10-CM | POA: Diagnosis not present

## 2012-12-19 DIAGNOSIS — G4730 Sleep apnea, unspecified: Secondary | ICD-10-CM | POA: Diagnosis not present

## 2012-12-19 NOTE — Progress Notes (Signed)
Patient ID: [name] [name], female    DOB: 1941/08/20, 71 y.o.   MRN: [id number]  HPI 07/20/11- 51 yoF former smoker- just quit in May. Followed for allergic rhinitis, hypersomnia w/ sleep apnea, complicated by DM, HBP, GERD Last here July 19, 2010- note reviewed Says dx'd pneumonia several weeks ago- treated by Dr Brigitte Pulse- they plan f/u CXR. She recognizes age, and gets DOE climbing steps quicker- moving bedroom downstairs. Chest feels tight- gets sore left base of neck- not exertional. Was coughing- stopped after antibiotic.  Has been very compliant with CPAP 9, [facility]. She is noticing dry mouth x 6 months- bad enough to wake her needing to take it off. The machine is old and she will check with [facility] on replacement. Getting tired again in afternoons, blamed on not sleeping with it as much. Has tried Biotene. Not on drying meds.   08/21/11- 61 yoF former smoker- just quit in May. Followed for allergic rhinitis, hypersomnia w/ sleep apnea, complicated by DM, HBP, GERD Has felt better since here in July. Had f/u CXR from Dr Brigitte Pulse after her pneumonia "everything is fine". Denies cough "much better". Has stayed off cigarettes.  CPAP still has old machine, used every night all night at 9. Did get a new humidifier for it- dry mouth is improved. New mask is full face.  PFT- 08/21/11- minimal obstruction without response to bronchodilator, mild restriction, DLCO mildly reduced. FEV1/FVC 0.76 small airway flows 0.64 before bronchodilator. Aware that her stamina is not good- DOE on hills and stairs, but without chest pain palpitation or swelling.  12/19/11-  31 yoF former smoker- just quit in May, 2012. Followed for allergic rhinitis, hypersomnia w/ sleep apnea, complicated by DM, HBP, GERD Had flu vax. Doing well with her chronic issues.  CPAP- 9- Good compliance and control, used all night every night. Got new machine.  No significant allergy complaints in this season.   12/19/12- 3 yoF former  smoker- just quit in May, 2012. Followed for allergic rhinitis, hypersomnia w/ sleep apnea, complicated by DM, HBP, GERD FOLLOWS FOR: had to start using [facility] now for CPAP-needs new machine through [facility] and have download compliance report and see [place] in 90 days of report for insurance purposes.  CPAP 9/ [facility] being changed to [facility] due to insurance coverage.  Some persistent nasal congestion but no sinus infection. Her primary physician gets her chest x-rays.  Review of Systems- see HPI Constitutional:   No-   weight loss, night sweats, fevers, chills, fatigue, lassitude. HEENT:   No-   headaches, difficulty swallowing, tooth/dental problems, sore throat,                  No-   sneezing, itching, ear ache, +nasal congestion, post nasal drip,  CV:  No-   chest pain, orthopnea, PND, swelling in lower extremities, anasarca, dizziness, palpitations GI:  No-   heartburn, indigestion, abdominal pain, nausea, vomiting, diarrhea,                 change in bowel habits, loss of appetite Resp:   No-  excess mucus,             No-   productive cough,  No non-productive cough,  No-  coughing up of blood.              No-   change in color of mucus.  No- wheezing.   Skin: No-   rash or lesions. GU:  MS:  No-  joint pain or swelling.  No- decreased range of motion.  No- back pain. Psych:  No- change in mood or affect. No depression or anxiety.  No memory loss.   Objective:   Physical Exam General- Alert, Oriented, Affect-appropriate, Distress- none acute, overweight Skin- rash-none, lesions- none, excoriation- none Lymphadenopathy- none Head- atraumatic            Eyes- Gross vision intact, PERRLA, conjunctivae clear secretions            Ears- Hearing, canals-normal            Nose- Clear, no-Septal dev, mucus, polyps, erosion, perforation             Throat- Mallampati III-IV , mucosa clear , drainage- none, tonsils- atrophic Neck- flexible , trachea midline, no stridor , thyroid nl, carotid no  bruit Chest - symmetrical excursion , unlabored           Heart/CV- RRR , no murmur , no gallop  , no rub, nl s1 s2                           - JVD- none , edema- none, stasis changes- none, varices- none           Lung- Clear to P&A, + slight wheezy quality to her laughter, dullness-none, rub- none           Chest wall-  Abd-  Br/ Gen/ Rectal- Not done, not indicated Extrem- cyanosis- none, clubbing, none, atrophy- none, strength- nl Neuro- grossly intact to observation

## 2012-12-19 NOTE — Patient Instructions (Addendum)
Order- [facility]- replacement CPAP machine 9 cwp, humidifier, mask of choice, supplies    Dx OSA     Old machine is not functioning normally             Compliance download to be scheduled by [facility]

## 2012-12-30 NOTE — Assessment & Plan Note (Signed)
Her CPAP machine is more than 71 years old. Buttons stick and humidifier no longer functions. She needs a new CPAP machine and she needs to establish with a new home care company to meet insurance requirements. Compliance and control has been very good

## 2013-01-14 DIAGNOSIS — I10 Essential (primary) hypertension: Secondary | ICD-10-CM | POA: Diagnosis not present

## 2013-01-14 DIAGNOSIS — E1149 Type 2 diabetes mellitus with other diabetic neurological complication: Secondary | ICD-10-CM | POA: Diagnosis not present

## 2013-02-10 DIAGNOSIS — Z1212 Encounter for screening for malignant neoplasm of rectum: Secondary | ICD-10-CM | POA: Diagnosis not present

## 2013-02-10 DIAGNOSIS — Z124 Encounter for screening for malignant neoplasm of cervix: Secondary | ICD-10-CM | POA: Diagnosis not present

## 2013-02-10 DIAGNOSIS — M542 Cervicalgia: Secondary | ICD-10-CM | POA: Diagnosis not present

## 2013-02-20 ENCOUNTER — Encounter: Payer: Self-pay | Admitting: Internal Medicine

## 2013-02-20 ENCOUNTER — Ambulatory Visit (INDEPENDENT_AMBULATORY_CARE_PROVIDER_SITE_OTHER): Payer: Medicare Other | Admitting: Internal Medicine

## 2013-02-20 VITALS — BP 110/70 | HR 101 | Ht 64.0 in | Wt 176.0 lb

## 2013-02-20 DIAGNOSIS — J3089 Other allergic rhinitis: Secondary | ICD-10-CM

## 2013-02-20 DIAGNOSIS — J309 Allergic rhinitis, unspecified: Secondary | ICD-10-CM | POA: Diagnosis not present

## 2013-02-20 DIAGNOSIS — G4733 Obstructive sleep apnea (adult) (pediatric): Secondary | ICD-10-CM | POA: Diagnosis not present

## 2013-02-20 DIAGNOSIS — J302 Other seasonal allergic rhinitis: Secondary | ICD-10-CM

## 2013-02-20 NOTE — Patient Instructions (Addendum)
We can continue CPAP 9/ [facility]. When the download comes in, if it suggests a change we will contact [place]. If we make a change you don't like, please let us know.   Please call as needed

## 2013-02-20 NOTE — Progress Notes (Signed)
Patient ID: [name] [name], female    DOB: 04/21/1941, 72 y.o.   MRN: [id number]  HPI 07/20/11- 73 yoF former smoker- just quit in May. Followed for allergic rhinitis, hypersomnia w/ sleep apnea, complicated by DM, HBP, GERD Last here July 19, 2010- note reviewed Says dx'd pneumonia several weeks ago- treated by Dr Brigitte Pulse- they plan f/u CXR. She recognizes age, and gets DOE climbing steps quicker- moving bedroom downstairs. Chest feels tight- gets sore left base of neck- not exertional. Was coughing- stopped after antibiotic.  Has been very compliant with CPAP 9, [facility]. She is noticing dry mouth x 6 months- bad enough to wake her needing to take it off. The machine is old and she will check with [facility] on replacement. Getting tired again in afternoons, blamed on not sleeping with it as much. Has tried Biotene. Not on drying meds.   08/21/11- 89 yoF former smoker- just quit in May. Followed for allergic rhinitis, hypersomnia w/ sleep apnea, complicated by DM, HBP, GERD Has felt better since here in July. Had f/u CXR from Dr Brigitte Pulse after her pneumonia "everything is fine". Denies cough "much better". Has stayed off cigarettes.  CPAP still has old machine, used every night all night at 9. Did get a new humidifier for it- dry mouth is improved. New mask is full face.  PFT- 08/21/11- minimal obstruction without response to bronchodilator, mild restriction, DLCO mildly reduced. FEV1/FVC 0.76 small airway flows 0.64 before bronchodilator. Aware that her stamina is not good- DOE on hills and stairs, but without chest pain palpitation or swelling.  12/19/11-  51 yoF former smoker- just quit in May, 2012. Followed for allergic rhinitis, hypersomnia w/ sleep apnea, complicated by DM, HBP, GERD Had flu vax. Doing well with her chronic issues.  CPAP- 9- Good compliance and control, used all night every night. Got new machine.  No significant allergy complaints in this season.   12/19/12- 34 yoF former  smoker- just quit in May, 2012. Followed for allergic rhinitis, hypersomnia w/ sleep apnea, complicated by DM, HBP, GERD FOLLOWS FOR: had to start using [facility] now for CPAP-needs new machine through [facility] and have download compliance report and see [place] in 90 days of report for insurance purposes.  CPAP 9/ [facility] being [facility] to [facility] due to insurance coverage.  Some persistent nasal congestion but no sinus infection. Her primary physician gets her chest x-rays.  02/20/13- 17 yoF former smoker- just quit in May, 2012. Followed for allergic rhinitis, hypersomnia w/ sleep apnea, complicated by DM, HBP, GERD FOLLOWS FOR: wears CPAP 9/ [facility] every night for about 7-9 hours; pressure working well for patient. Has new machine, sleeps well and takes fewer naps. Allergic rhinitis is well controlled now before starting the spring season.  Review of Systems- see HPI Constitutional:   No-   weight loss, night sweats, fevers, chills, fatigue, lassitude. HEENT:   No-   headaches, difficulty swallowing, tooth/dental problems, sore throat,                  No-   sneezing, itching, ear ache, no-nasal congestion, post nasal drip,  CV:  No-   chest pain, orthopnea, PND, swelling in lower extremities, anasarca, dizziness, palpitations GI:  No-   heartburn, indigestion, abdominal pain, nausea, vomiting,  Resp:   No-  excess mucus,             No-   productive cough,  No non-productive cough,  No-  coughing up of blood.  No-   change in color of mucus.  No- wheezing.   Skin: No-   rash or lesions. GU:  MS:  No-   joint pain or swelling.  Psych:  No- change in mood or affect. No depression or anxiety.  No memory loss.   Objective:   Physical Exam General- Alert, Oriented, Affect-appropriate, Distress- none acute, overweight Skin- rash-none, lesions- none, excoriation- none Lymphadenopathy- none Head- atraumatic            Eyes- Gross vision intact, PERRLA, conjunctivae clear secretions             Ears- Hearing, canals-normal            Nose- Clear, no-Septal dev, mucus, polyps, erosion, perforation             Throat- Mallampati III-IV , mucosa clear , drainage- none, tonsils- atrophic Neck- flexible , trachea midline, no stridor , thyroid nl, carotid no bruit Chest - symmetrical excursion , unlabored           Heart/CV- RRR , no murmur , no gallop  , no rub, nl s1 s2                           - JVD- none , edema- none, stasis changes- none, varices- none           Lung- Clear to P&A, no-wheezing, dullness-none, rub- none           Chest wall-  Abd-  Br/ Gen/ Rectal- Not done, not indicated Extrem- cyanosis- none, clubbing, none, atrophy- none, strength- nl Neuro- grossly intact to observation

## 2013-02-23 NOTE — Assessment & Plan Note (Signed)
Good compliance and control with CPAP. Improved quality of life.

## 2013-02-23 NOTE — Assessment & Plan Note (Signed)
She will start with over-the-counter antihistamines as needed with the spring season.

## 2013-02-26 ENCOUNTER — Encounter: Payer: Self-pay | Admitting: Internal Medicine

## 2013-02-26 DIAGNOSIS — E1149 Type 2 diabetes mellitus with other diabetic neurological complication: Secondary | ICD-10-CM | POA: Diagnosis not present

## 2013-02-26 DIAGNOSIS — I10 Essential (primary) hypertension: Secondary | ICD-10-CM | POA: Diagnosis not present

## 2013-02-26 DIAGNOSIS — E785 Hyperlipidemia, unspecified: Secondary | ICD-10-CM | POA: Diagnosis not present

## 2013-02-26 DIAGNOSIS — M899 Disorder of bone, unspecified: Secondary | ICD-10-CM | POA: Diagnosis not present

## 2013-03-10 DIAGNOSIS — M899 Disorder of bone, unspecified: Secondary | ICD-10-CM | POA: Diagnosis not present

## 2013-03-10 DIAGNOSIS — M949 Disorder of cartilage, unspecified: Secondary | ICD-10-CM | POA: Diagnosis not present

## 2013-06-26 DIAGNOSIS — D72829 Elevated white blood cell count, unspecified: Secondary | ICD-10-CM | POA: Diagnosis not present

## 2013-06-26 DIAGNOSIS — M899 Disorder of bone, unspecified: Secondary | ICD-10-CM | POA: Diagnosis not present

## 2013-06-26 DIAGNOSIS — E1149 Type 2 diabetes mellitus with other diabetic neurological complication: Secondary | ICD-10-CM | POA: Diagnosis not present

## 2013-06-26 DIAGNOSIS — E559 Vitamin D deficiency, unspecified: Secondary | ICD-10-CM | POA: Diagnosis not present

## 2013-06-26 DIAGNOSIS — E785 Hyperlipidemia, unspecified: Secondary | ICD-10-CM | POA: Diagnosis not present

## 2013-06-26 DIAGNOSIS — I10 Essential (primary) hypertension: Secondary | ICD-10-CM | POA: Diagnosis not present

## 2013-06-26 DIAGNOSIS — R5381 Other malaise: Secondary | ICD-10-CM | POA: Diagnosis not present

## 2013-06-26 DIAGNOSIS — G4733 Obstructive sleep apnea (adult) (pediatric): Secondary | ICD-10-CM | POA: Diagnosis not present

## 2013-09-28 ENCOUNTER — Other Ambulatory Visit (HOSPITAL_COMMUNITY): Payer: Self-pay | Admitting: Internal Medicine

## 2013-09-28 DIAGNOSIS — Z139 Encounter for screening, unspecified: Secondary | ICD-10-CM

## 2013-09-29 ENCOUNTER — Ambulatory Visit (HOSPITAL_COMMUNITY)
Admission: RE | Admit: 2013-09-29 | Discharge: 2013-09-29 | Disposition: A | Discharge status: Home / Self Care | Payer: Medicare Other | Source: Ambulatory Visit | Attending: Internal Medicine | Admitting: Internal Medicine

## 2013-09-29 DIAGNOSIS — Z1231 Encounter for screening mammogram for malignant neoplasm of breast: Secondary | ICD-10-CM | POA: Insufficient documentation

## 2013-09-29 DIAGNOSIS — Z139 Encounter for screening, unspecified: Secondary | ICD-10-CM

## 2013-09-29 IMAGING — MG MM DIGITAL SCREENING
4 series · 4 of 4 positions shown · non-contrast
Comparison: Previous exam(s).

CLINICAL DATA: Screening.

DIGITAL SCREENING BILATERAL MAMMOGRAM WITH CAD

[L CC]
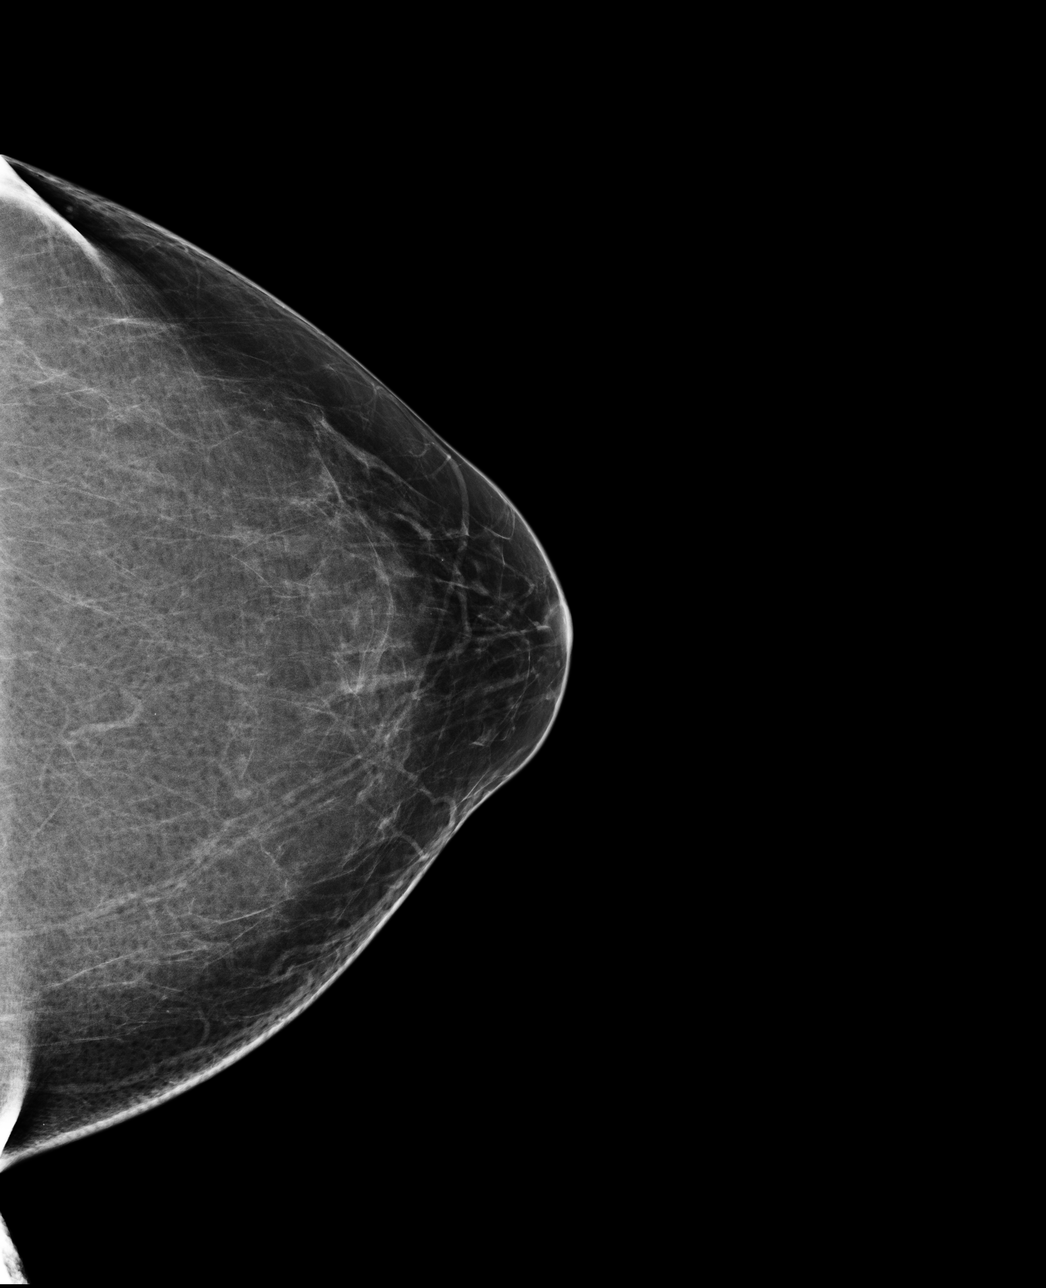

[L MLO]
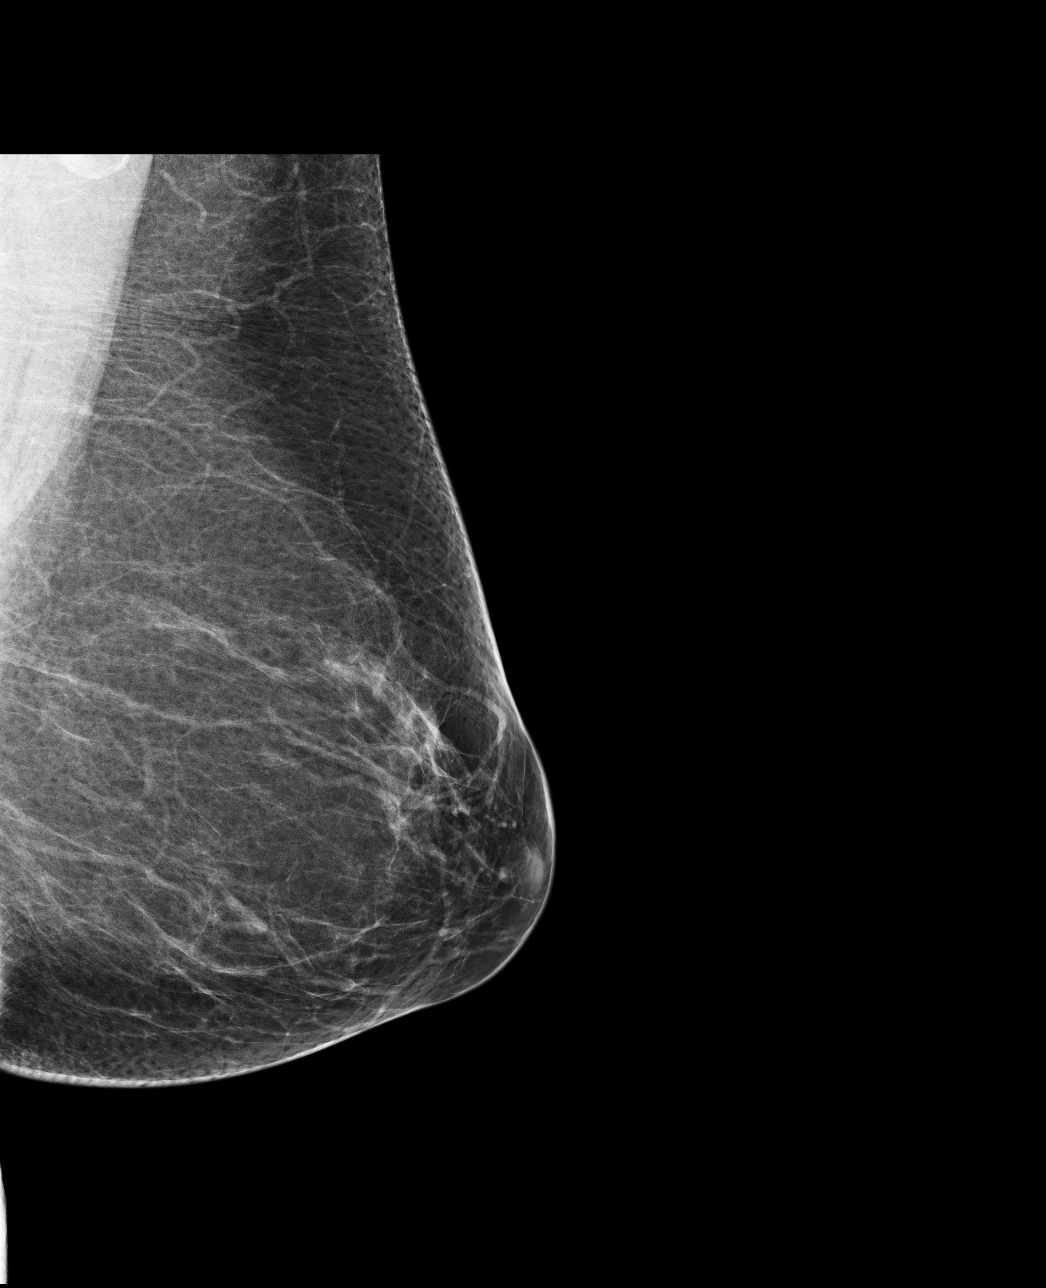

[R CC]
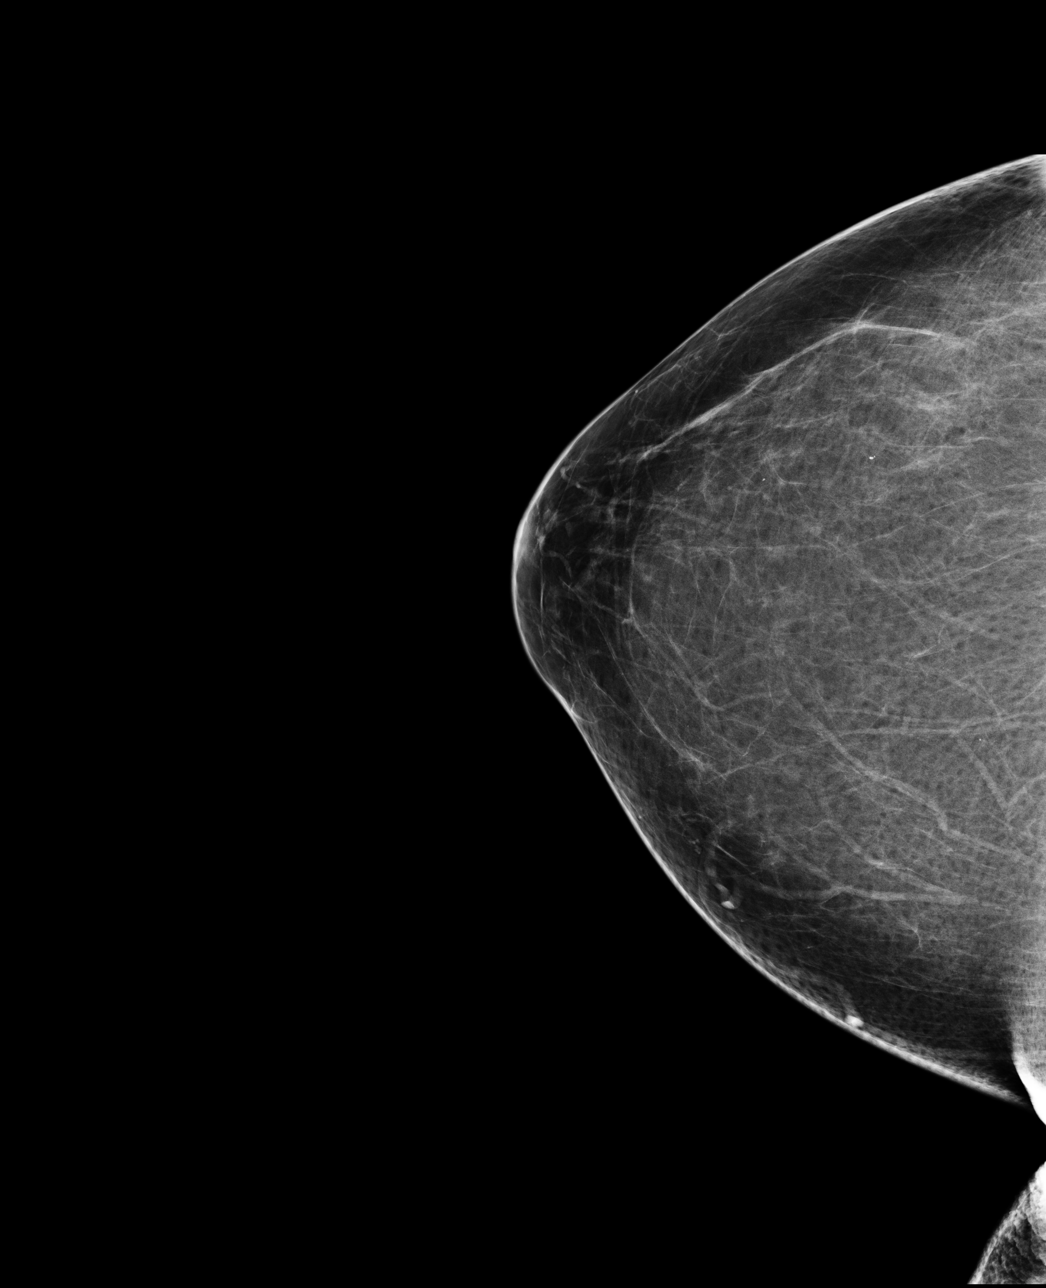

[R MLO]
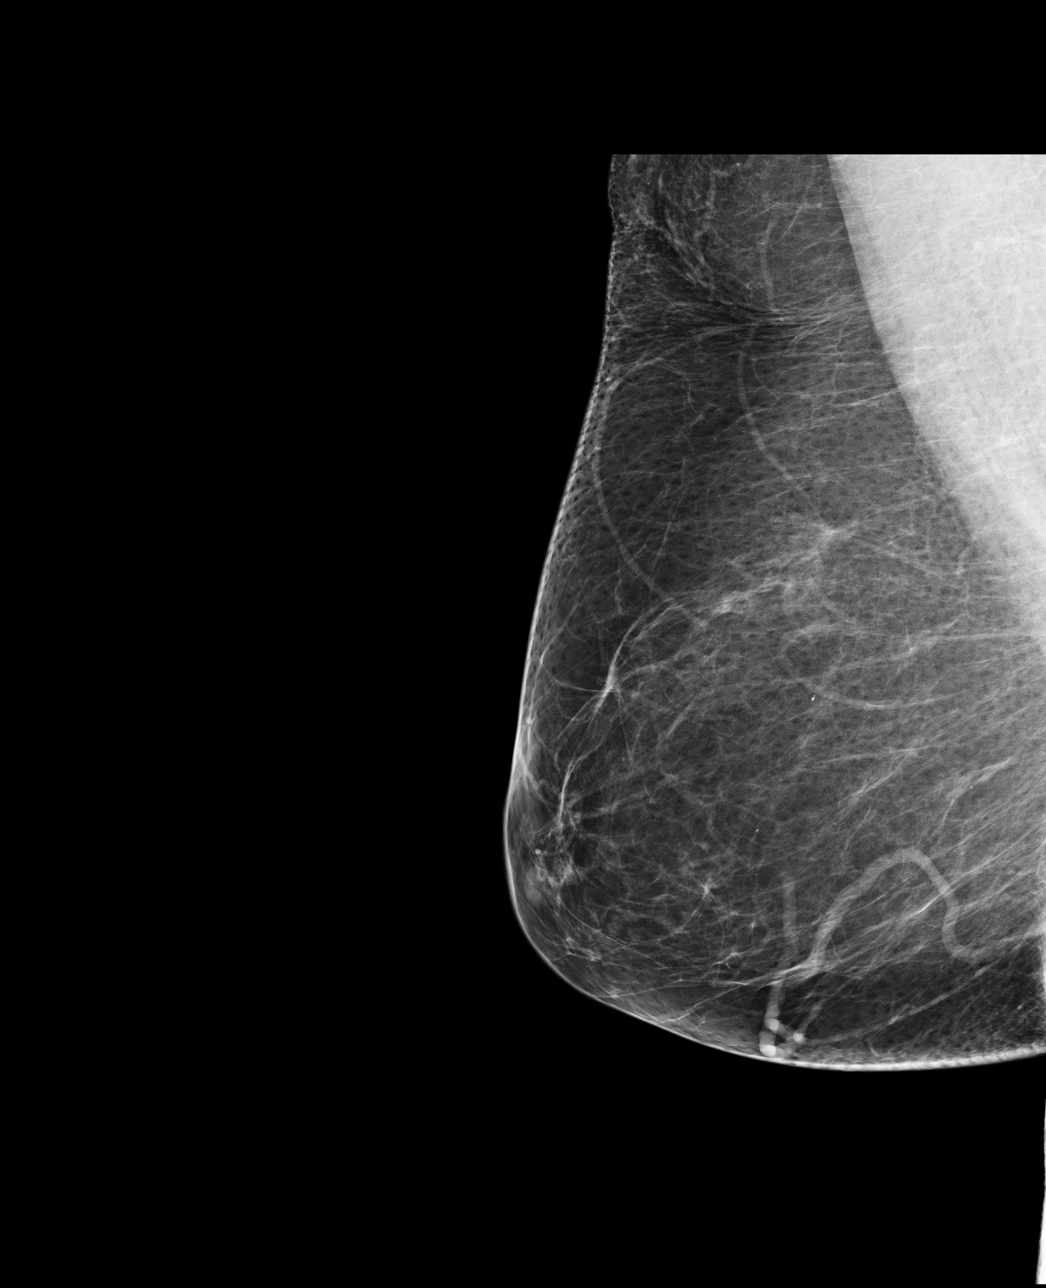

[4 of 4 positions shown; findings below may reference images not displayed]

FINDINGS: ACR Breast Density Category a:  The breast tissue is almost
entirely fatty.

There are no findings suspicious for malignancy.

Images were processed with CAD.
IMPRESSION: No mammographic evidence of malignancy.

A result letter of this screening mammogram will be mailed directly
to the patient.

RECOMMENDATION:
Screening mammogram in one year. (Code:[ZX])

BI-RADS CATEGORY 1:  Negative.

## 2013-10-14 DIAGNOSIS — E1149 Type 2 diabetes mellitus with other diabetic neurological complication: Secondary | ICD-10-CM | POA: Diagnosis not present

## 2013-10-14 DIAGNOSIS — E785 Hyperlipidemia, unspecified: Secondary | ICD-10-CM | POA: Diagnosis not present

## 2013-10-14 DIAGNOSIS — I10 Essential (primary) hypertension: Secondary | ICD-10-CM | POA: Diagnosis not present

## 2013-10-14 DIAGNOSIS — M899 Disorder of bone, unspecified: Secondary | ICD-10-CM | POA: Diagnosis not present

## 2013-10-14 DIAGNOSIS — R809 Proteinuria, unspecified: Secondary | ICD-10-CM | POA: Diagnosis not present

## 2013-10-28 ENCOUNTER — Other Ambulatory Visit: Payer: Self-pay | Admitting: Internal Medicine

## 2013-10-28 ENCOUNTER — Ambulatory Visit
Admission: RE | Admit: 2013-10-28 | Discharge: 2013-10-28 | Disposition: A | Discharge status: Home / Self Care | Payer: Medicare Other | Source: Ambulatory Visit | Attending: Internal Medicine | Admitting: Internal Medicine

## 2013-10-28 DIAGNOSIS — R109 Unspecified abdominal pain: Secondary | ICD-10-CM

## 2013-10-28 DIAGNOSIS — E1149 Type 2 diabetes mellitus with other diabetic neurological complication: Secondary | ICD-10-CM | POA: Diagnosis not present

## 2013-10-28 DIAGNOSIS — N200 Calculus of kidney: Secondary | ICD-10-CM | POA: Diagnosis not present

## 2013-10-28 DIAGNOSIS — Z23 Encounter for immunization: Secondary | ICD-10-CM | POA: Diagnosis not present

## 2013-10-28 DIAGNOSIS — E785 Hyperlipidemia, unspecified: Secondary | ICD-10-CM | POA: Diagnosis not present

## 2013-10-28 DIAGNOSIS — G4733 Obstructive sleep apnea (adult) (pediatric): Secondary | ICD-10-CM | POA: Diagnosis not present

## 2013-10-28 DIAGNOSIS — Z1331 Encounter for screening for depression: Secondary | ICD-10-CM | POA: Diagnosis not present

## 2013-10-28 DIAGNOSIS — Z0000 Encounter for general adult medical examination without abnormal findings: Secondary | ICD-10-CM | POA: Diagnosis not present

## 2013-10-28 DIAGNOSIS — R3129 Other microscopic hematuria: Secondary | ICD-10-CM

## 2013-10-28 DIAGNOSIS — R102 Pelvic and perineal pain: Secondary | ICD-10-CM

## 2013-10-28 DIAGNOSIS — I10 Essential (primary) hypertension: Secondary | ICD-10-CM | POA: Diagnosis not present

## 2013-10-28 DIAGNOSIS — E559 Vitamin D deficiency, unspecified: Secondary | ICD-10-CM | POA: Diagnosis not present

## 2013-10-28 DIAGNOSIS — M899 Disorder of bone, unspecified: Secondary | ICD-10-CM | POA: Diagnosis not present

## 2013-10-28 IMAGING — CT CT ABD-PELV W/ CM
2 of 5 series · 16 of 46 positions shown, 18 images · IV contrast (30CC OMNI 300 & [ID] OMNI 300)
Comparison: CT urogram of [DATE]

ADDENDUM:
Please note the correct ordering provider should be Dr. DALVIN
DALVIN.
CLINICAL DATA: Right flank and right pelvic pain for several weeks,
some nausea, history of kidney stones

EXAM:
CT ABDOMEN AND PELVIS WITH CONTRAST
TECHNIQUE: Multidetector CT imaging of the abdomen and pelvis was performed
using the standard protocol following bolus administration of
intravenous contrast.
CONTRAST:  30mL OMNIPAQUE IOHEXOL 300 MG/ML SOLN, 100mL OMNIPAQUE
IOHEXOL 300 MG/ML SOLN

[Series 2: abd/pelvis with · axial · 0.82mm/px · z∈[-388,+12]mm · 13 of 90 slices shown, 15 images]
[im 5/90  soft-tissue]
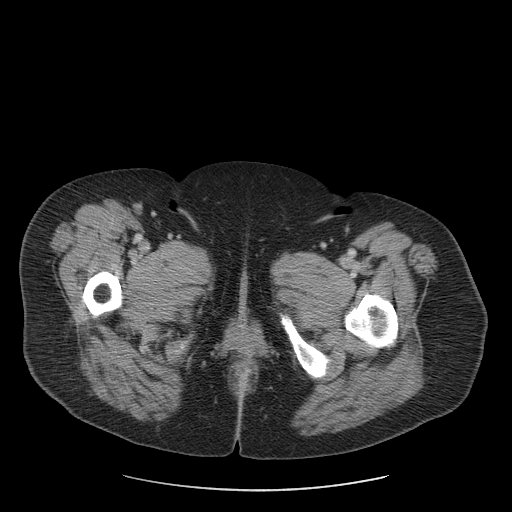
[im 5/90  bone]
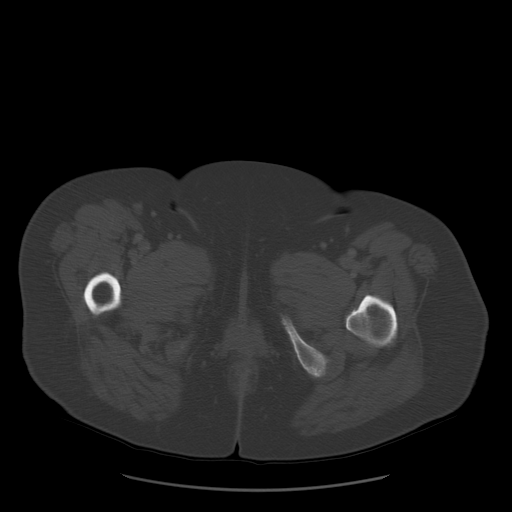
[im 14/90  soft-tissue]
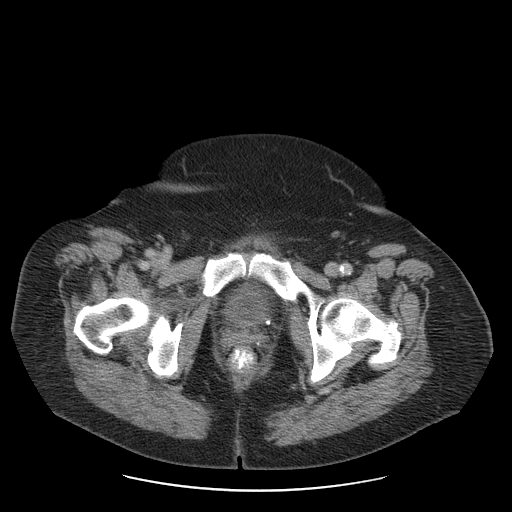
[im 18/90  soft-tissue]
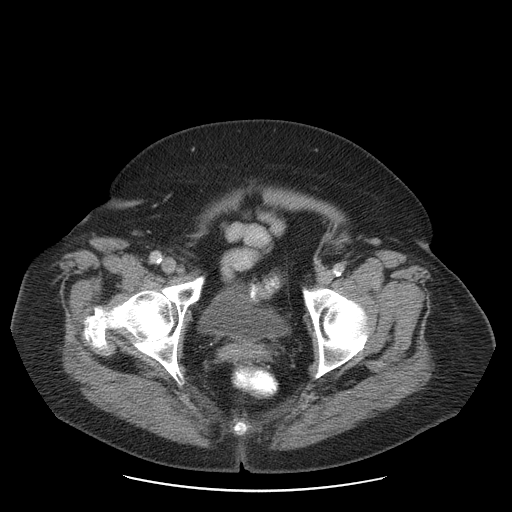
[im 27/90  soft-tissue]
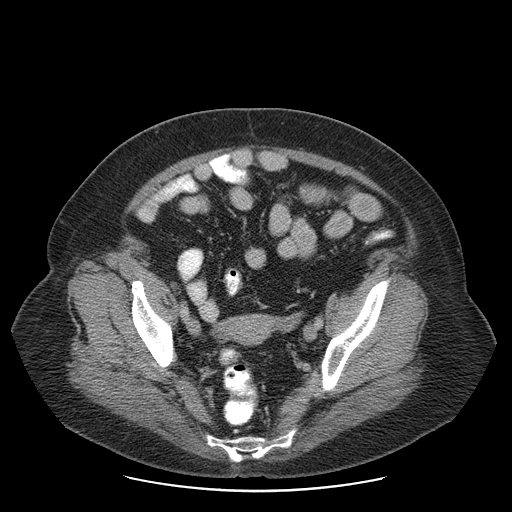
[im 32/90  soft-tissue]
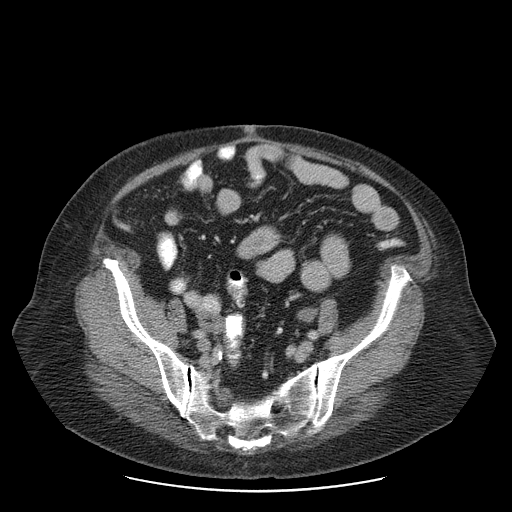
[im 41/90  soft-tissue]
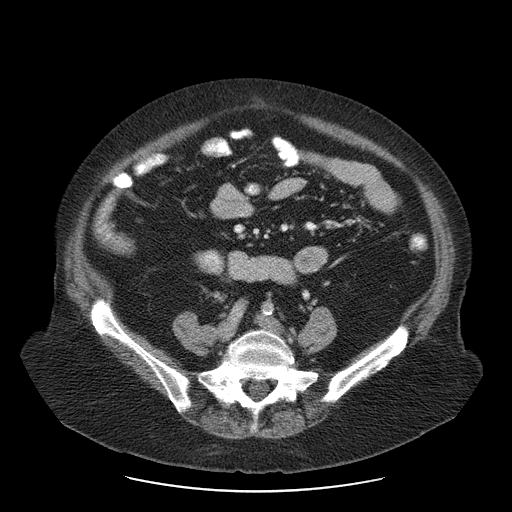
[im 45/90  soft-tissue]
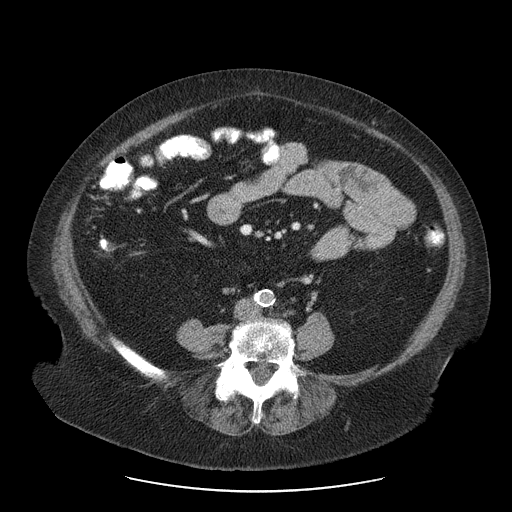
[im 49/90  soft-tissue]
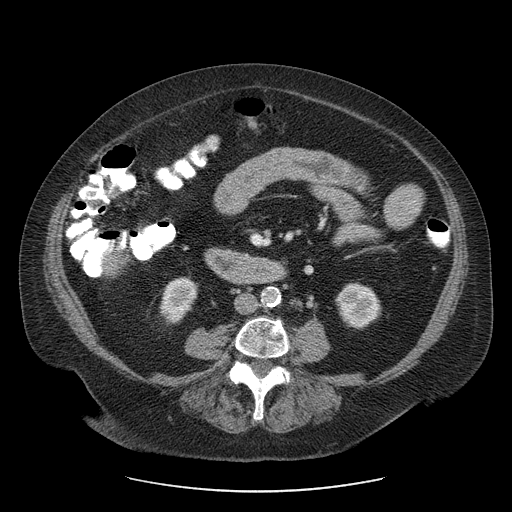
[im 58/90  soft-tissue]
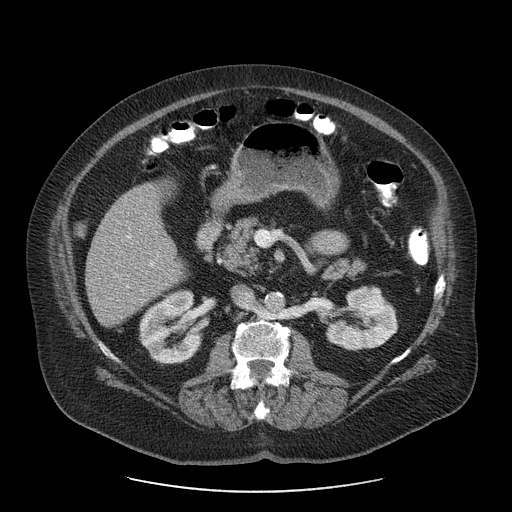
[im 58/90  bone]
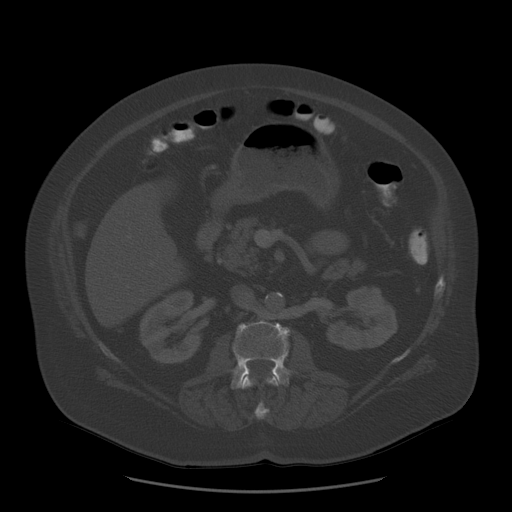
[im 63/90  soft-tissue]
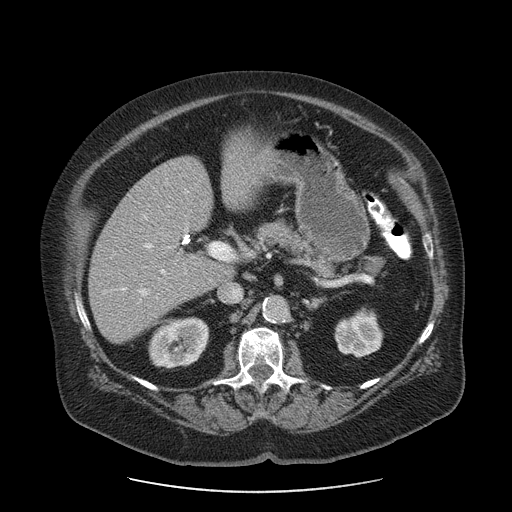
[im 72/90  soft-tissue]
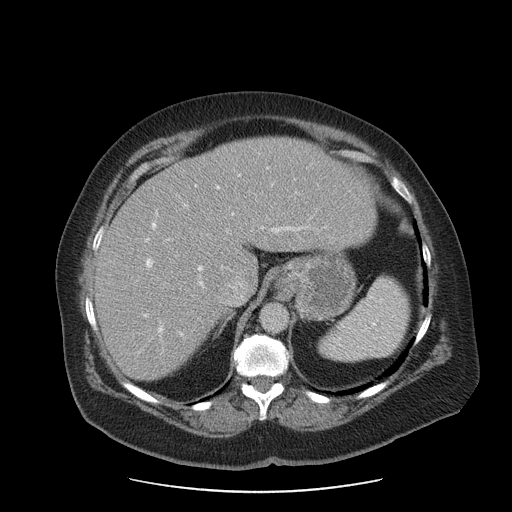
[im 76/90  soft-tissue]
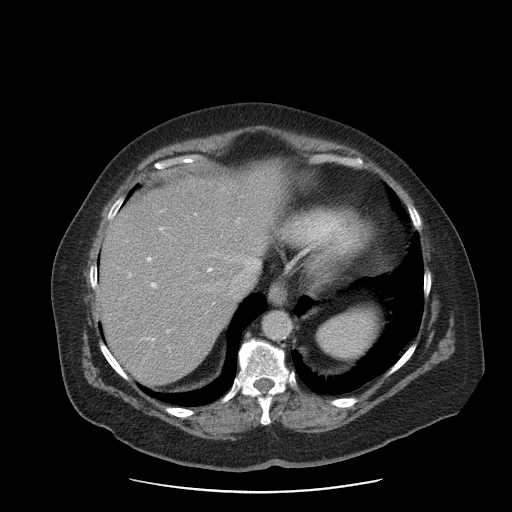
[im 85/90  soft-tissue]
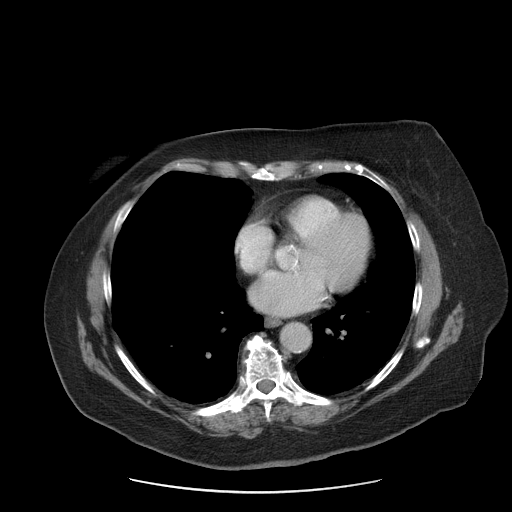

[Series 400: coronal · coronal · 0.89mm/px · 3 of 146 slices shown]
[im 49/146  soft-tissue]
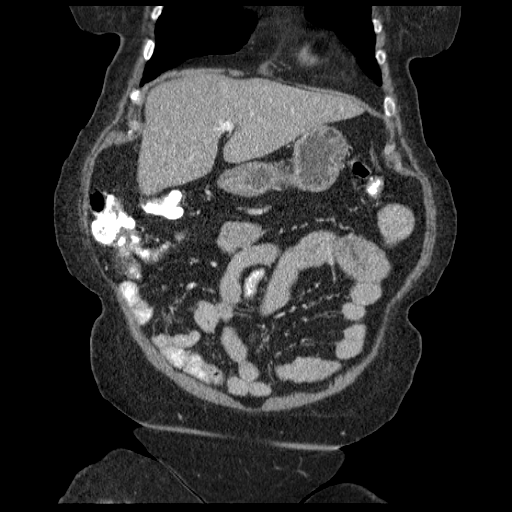
[im 65/146  soft-tissue]
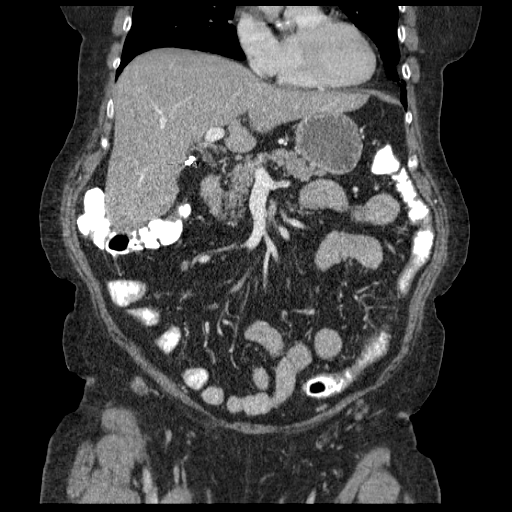
[im 81/146  soft-tissue]
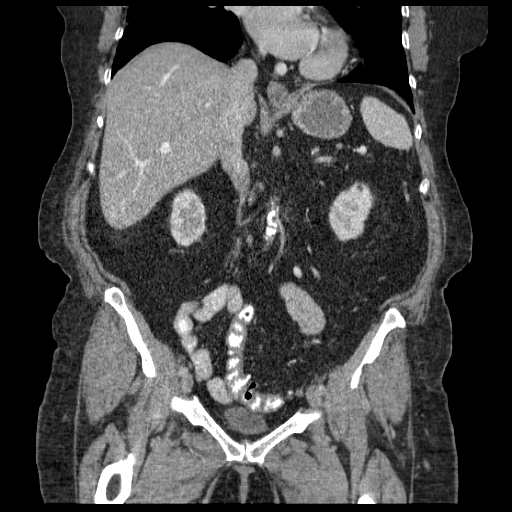

[16 of 46 positions shown; findings below may reference images not displayed]

FINDINGS: The lung bases are clear. The liver enhances with no focal
abnormality and no ductal dilatation is seen. Surgical clips are
present from prior cholecystectomy. There may be a small hiatal
hernia present. The pancreas is normal in size and the pancreatic
duct is not dilated. The adrenal glands and spleen are unremarkable.
The stomach is moderately distended with fluid with no abnormality
noted. The kidneys enhance and there are bilateral renal calculi
present. The largest calculus is in the upper pole of the right
kidney measuring 13 x 4 x 7 mm common with the previous maximum
diameter of 6 mm in [F8]. No hydronephrosis is seen. The proximal
ureters are normal in caliber. The dome aorta is normal caliber with
moderate atheromatous change present. No adenopathy is seen with
small retroperitoneal nodes present.

The distal ureters are normal in caliber and no distal ureteral
calculus is seen. The urinary bladder is decompressed. The uterus is
normal in size and no adnexal lesion is seen. No fluid is noted
within the pelvis. There are rectosigmoid colon diverticula present
but no diverticulitis is seen. No other abnormality of the colon is
seen. The terminal ileum is unremarkable. The appendix is small with
no evidence of inflammation on coronal images. Degenerative change
is noted involving the SI joints.
IMPRESSION: 1. Bilateral nonobstructing renal calculi with the largest calculus
in the upper pole collecting system of the right kidney. No ureteral
calculi are seen.
2. No abnormality of the urinary bladder is seen which is somewhat
decompressed.
3. The appendix and terminal ileum are unremarkable.

## 2013-10-28 MED ORDER — IOHEXOL 300 MG/ML  SOLN
30.0000 mL | Freq: Once | INTRAMUSCULAR | Status: AC | PRN
  Administered 2013-10-28: 30 mL via ORAL

## 2013-10-28 MED ORDER — IOHEXOL 300 MG/ML  SOLN
100.0000 mL | Freq: Once | INTRAMUSCULAR | Status: AC | PRN
  Administered 2013-10-28: 100 mL via INTRAVENOUS

## 2013-10-29 ENCOUNTER — Other Ambulatory Visit: Payer: Self-pay | Admitting: Internal Medicine

## 2013-10-29 DIAGNOSIS — Z1212 Encounter for screening for malignant neoplasm of rectum: Secondary | ICD-10-CM | POA: Diagnosis not present

## 2013-10-30 ENCOUNTER — Other Ambulatory Visit: Payer: Self-pay | Admitting: Internal Medicine

## 2013-10-30 DIAGNOSIS — R3129 Other microscopic hematuria: Secondary | ICD-10-CM

## 2013-10-30 DIAGNOSIS — R102 Pelvic and perineal pain: Secondary | ICD-10-CM

## 2013-10-30 DIAGNOSIS — R109 Unspecified abdominal pain: Secondary | ICD-10-CM

## 2014-02-22 ENCOUNTER — Encounter (INDEPENDENT_AMBULATORY_CARE_PROVIDER_SITE_OTHER): Payer: Self-pay

## 2014-02-22 ENCOUNTER — Encounter: Payer: Self-pay | Admitting: Internal Medicine

## 2014-02-22 ENCOUNTER — Ambulatory Visit (INDEPENDENT_AMBULATORY_CARE_PROVIDER_SITE_OTHER): Payer: Medicare Other | Admitting: Internal Medicine

## 2014-02-22 VITALS — BP 110/74 | HR 72 | Ht 63.5 in | Wt 186.0 lb

## 2014-02-22 DIAGNOSIS — K1120 Sialoadenitis, unspecified: Secondary | ICD-10-CM | POA: Diagnosis not present

## 2014-02-22 DIAGNOSIS — G4733 Obstructive sleep apnea (adult) (pediatric): Secondary | ICD-10-CM | POA: Diagnosis not present

## 2014-02-22 DIAGNOSIS — F17200 Nicotine dependence, unspecified, uncomplicated: Secondary | ICD-10-CM

## 2014-02-22 NOTE — Patient Instructions (Signed)
Order- [facility]- reduce CPAP to 8 cwp   Dx OSA   Try room humidifier  Ask pharmacist if they can suggest alternative products for mouth dryness, to compare with Biotene  Please call as needed

## 2014-02-22 NOTE — Progress Notes (Signed)
Patient ID: [name] [name], female    DOB: 04-13-1941, 73 y.o.   MRN: [id number]  HPI 07/20/11- 76 yoF former smoker- just quit in May. Followed for allergic rhinitis, hypersomnia w/ sleep apnea, complicated by DM, HBP, GERD Last here July 19, 2010- note reviewed Says dx'd pneumonia several weeks ago- treated by Dr Brigitte Pulse- they plan f/u CXR. She recognizes age, and gets DOE climbing steps quicker- moving bedroom downstairs. Chest feels tight- gets sore left base of neck- not exertional. Was coughing- stopped after antibiotic.  Has been very compliant with CPAP 9, [facility]. She is noticing dry mouth x 6 months- bad enough to wake her needing to take it off. The machine is old and she will check with [facility] on replacement. Getting tired again in afternoons, blamed on not sleeping with it as much. Has tried Biotene. Not on drying meds.   08/21/11- 59 yoF former smoker- just quit in May. Followed for allergic rhinitis, hypersomnia w/ sleep apnea, complicated by DM, HBP, GERD Has felt better since here in July. Had f/u CXR from Dr Brigitte Pulse after her pneumonia "everything is fine". Denies cough "much better". Has stayed off cigarettes.  CPAP still has old machine, used every night all night at 9. Did get a new humidifier for it- dry mouth is improved. New mask is full face.  PFT- 08/21/11- minimal obstruction without response to bronchodilator, mild restriction, DLCO mildly reduced. FEV1/FVC 0.76 small airway flows 0.64 before bronchodilator. Aware that her stamina is not good- DOE on hills and stairs, but without chest pain palpitation or swelling.  12/19/11-  33 yoF former smoker- just quit in May, 2012. Followed for allergic rhinitis, hypersomnia w/ sleep apnea, complicated by DM, HBP, GERD Had flu vax. Doing well with her chronic issues.  CPAP- 9- Good compliance and control, used all night every night. Got new machine.  No significant allergy complaints in this season.   12/19/12- 69 yoF former  smoker- just quit in May, 2012. Followed for allergic rhinitis, hypersomnia w/ sleep apnea, complicated by DM, HBP, GERD FOLLOWS FOR: had to start using [facility] now for CPAP-needs new machine through [facility] and have download compliance report and see [place] in 90 days of report for insurance purposes.  CPAP 9/ [facility] being changed to [facility] due to insurance coverage.  Some persistent nasal congestion but no sinus infection. Her primary physician gets her chest x-rays.  02/20/13- 31 yoF former smoker- just quit in May, 2012. Followed for allergic rhinitis, hypersomnia w/ sleep apnea, complicated by DM, HBP, GERD FOLLOWS FOR: wears CPAP 9/ [facility] every night for about 7-9 hours; pressure working well for patient. Has new machine, sleeps well and takes fewer naps. Allergic rhinitis is well controlled now before starting the spring season.  02/22/13- 15 yoF former smoker- just quit in May, 2012. Followed for allergic rhinitis, hypersomnia w/ sleep apnea, complicated by DM, HBP, GERD FOLLOWS FOR: Wearing CPAP 9/ [facility] every night 5-8 hours. States pressure  is good. Complains of dry mouth despite Biotene, humidifier..   Review of Systems- see HPI Constitutional:   No-   weight loss, night sweats, fevers, chills, fatigue, lassitude. HEENT:   No-   headaches, difficulty swallowing, tooth/dental problems, sore throat,                  No-   sneezing, itching, ear ache, no-nasal congestion, post nasal drip,  CV:  No-   chest pain, orthopnea, PND, swelling in lower extremities, anasarca, dizziness, palpitations GI:  No-   heartburn, indigestion, abdominal pain, nausea, vomiting,  Resp:   No-  excess mucus,             No-   productive cough,  No non-productive cough,  No-  coughing up of blood.              No-   change in color of mucus.  No- wheezing.   Skin: No-   rash or lesions. GU:  MS:  No-   joint pain or swelling.  Psych:  No- change in mood or affect. No depression or anxiety.  No memory loss.    Objective:   Physical Exam General- Alert, Oriented, Affect-appropriate, Distress- none acute, overweight Skin- rash-none, lesions- none, excoriation- none Lymphadenopathy- none Head- atraumatic            Eyes- Gross vision intact, PERRLA, conjunctivae clear secretions            Ears- Hearing, canals-normal            Nose- Clear, no-Septal dev, mucus, polyps, erosion, perforation             Throat- Mallampati III-IV , mucosa-not unusually dry , drainage- none, tonsils- atrophic Neck- flexible , trachea midline, no stridor , thyroid nl, carotid no bruit Chest - symmetrical excursion , unlabored           Heart/CV- RRR , no murmur , no gallop  , no rub, nl s1 s2                           - JVD- none , edema- none, stasis changes- none, varices- none           Lung- Clear to P&A, no-wheezing, dullness-none, rub- none           Chest wall-  Abd-  Br/ Gen/ Rectal- Not done, not indicated Extrem- cyanosis- none, clubbing, none, atrophy- none, strength- nl Neuro- grossly intact to observation

## 2014-03-09 DIAGNOSIS — I10 Essential (primary) hypertension: Secondary | ICD-10-CM | POA: Diagnosis not present

## 2014-03-09 DIAGNOSIS — E1149 Type 2 diabetes mellitus with other diabetic neurological complication: Secondary | ICD-10-CM | POA: Diagnosis not present

## 2014-03-09 DIAGNOSIS — M949 Disorder of cartilage, unspecified: Secondary | ICD-10-CM | POA: Diagnosis not present

## 2014-03-09 DIAGNOSIS — M899 Disorder of bone, unspecified: Secondary | ICD-10-CM | POA: Diagnosis not present

## 2014-03-09 DIAGNOSIS — R300 Dysuria: Secondary | ICD-10-CM | POA: Diagnosis not present

## 2014-03-09 DIAGNOSIS — R3129 Other microscopic hematuria: Secondary | ICD-10-CM | POA: Diagnosis not present

## 2014-03-09 DIAGNOSIS — G4733 Obstructive sleep apnea (adult) (pediatric): Secondary | ICD-10-CM | POA: Diagnosis not present

## 2014-03-09 DIAGNOSIS — E785 Hyperlipidemia, unspecified: Secondary | ICD-10-CM | POA: Diagnosis not present

## 2014-03-09 DIAGNOSIS — R109 Unspecified abdominal pain: Secondary | ICD-10-CM | POA: Diagnosis not present

## 2014-03-19 ENCOUNTER — Encounter: Payer: Self-pay | Admitting: Internal Medicine

## 2014-03-19 NOTE — Assessment & Plan Note (Signed)
Plan- try room humidifier, reduce CPAP to 8 to reduce drying airflow. Ask pharmacist about alternatives to Biotene for complaint of dry mouth attributed to CPAP.

## 2014-03-19 NOTE — Assessment & Plan Note (Signed)
Question relation to his c/o dry mouth

## 2014-03-19 NOTE — Assessment & Plan Note (Signed)
Counseling reinforced 

## 2014-03-25 DIAGNOSIS — R3129 Other microscopic hematuria: Secondary | ICD-10-CM | POA: Diagnosis not present

## 2014-03-25 DIAGNOSIS — N952 Postmenopausal atrophic vaginitis: Secondary | ICD-10-CM | POA: Diagnosis not present

## 2014-03-25 DIAGNOSIS — N200 Calculus of kidney: Secondary | ICD-10-CM | POA: Diagnosis not present

## 2014-05-25 DIAGNOSIS — H1030 Unspecified acute conjunctivitis, unspecified eye: Secondary | ICD-10-CM | POA: Diagnosis not present

## 2014-07-14 DIAGNOSIS — I10 Essential (primary) hypertension: Secondary | ICD-10-CM | POA: Diagnosis not present

## 2014-07-14 DIAGNOSIS — E785 Hyperlipidemia, unspecified: Secondary | ICD-10-CM | POA: Diagnosis not present

## 2014-07-14 DIAGNOSIS — Z23 Encounter for immunization: Secondary | ICD-10-CM | POA: Diagnosis not present

## 2014-07-14 DIAGNOSIS — E1149 Type 2 diabetes mellitus with other diabetic neurological complication: Secondary | ICD-10-CM | POA: Diagnosis not present

## 2014-07-14 DIAGNOSIS — G4733 Obstructive sleep apnea (adult) (pediatric): Secondary | ICD-10-CM | POA: Diagnosis not present

## 2014-07-14 DIAGNOSIS — E669 Obesity, unspecified: Secondary | ICD-10-CM | POA: Diagnosis not present

## 2014-07-14 DIAGNOSIS — Z1331 Encounter for screening for depression: Secondary | ICD-10-CM | POA: Diagnosis not present

## 2014-09-01 DIAGNOSIS — K5730 Diverticulosis of large intestine without perforation or abscess without bleeding: Secondary | ICD-10-CM | POA: Diagnosis not present

## 2014-09-01 DIAGNOSIS — Z86010 Personal history of colonic polyps: Secondary | ICD-10-CM | POA: Diagnosis not present

## 2014-09-01 DIAGNOSIS — D126 Benign neoplasm of colon, unspecified: Secondary | ICD-10-CM | POA: Diagnosis not present

## 2014-09-01 DIAGNOSIS — Z09 Encounter for follow-up examination after completed treatment for conditions other than malignant neoplasm: Secondary | ICD-10-CM | POA: Diagnosis not present

## 2014-11-08 ENCOUNTER — Other Ambulatory Visit: Payer: Self-pay

## 2014-11-08 DIAGNOSIS — Z1231 Encounter for screening mammogram for malignant neoplasm of breast: Secondary | ICD-10-CM

## 2014-11-26 DIAGNOSIS — E785 Hyperlipidemia, unspecified: Secondary | ICD-10-CM | POA: Diagnosis not present

## 2014-11-26 DIAGNOSIS — I10 Essential (primary) hypertension: Secondary | ICD-10-CM | POA: Diagnosis not present

## 2014-11-26 DIAGNOSIS — E1149 Type 2 diabetes mellitus with other diabetic neurological complication: Secondary | ICD-10-CM | POA: Diagnosis not present

## 2014-11-26 DIAGNOSIS — Z0000 Encounter for general adult medical examination without abnormal findings: Secondary | ICD-10-CM | POA: Diagnosis not present

## 2014-12-03 ENCOUNTER — Ambulatory Visit
Admission: RE | Admit: 2014-12-03 | Discharge: 2014-12-03 | Disposition: A | Discharge status: Home / Self Care | Payer: Medicare Other | Source: Ambulatory Visit

## 2014-12-03 DIAGNOSIS — E1129 Type 2 diabetes mellitus with other diabetic kidney complication: Secondary | ICD-10-CM | POA: Diagnosis not present

## 2014-12-03 DIAGNOSIS — E1149 Type 2 diabetes mellitus with other diabetic neurological complication: Secondary | ICD-10-CM | POA: Diagnosis not present

## 2014-12-03 DIAGNOSIS — I10 Essential (primary) hypertension: Secondary | ICD-10-CM | POA: Diagnosis not present

## 2014-12-03 DIAGNOSIS — Z1231 Encounter for screening mammogram for malignant neoplasm of breast: Secondary | ICD-10-CM | POA: Diagnosis not present

## 2014-12-03 DIAGNOSIS — E785 Hyperlipidemia, unspecified: Secondary | ICD-10-CM | POA: Diagnosis not present

## 2014-12-03 DIAGNOSIS — Z23 Encounter for immunization: Secondary | ICD-10-CM | POA: Diagnosis not present

## 2014-12-03 DIAGNOSIS — D649 Anemia, unspecified: Secondary | ICD-10-CM | POA: Diagnosis not present

## 2014-12-03 DIAGNOSIS — N183 Chronic kidney disease, stage 3 (moderate): Secondary | ICD-10-CM | POA: Diagnosis not present

## 2014-12-03 DIAGNOSIS — Z0000 Encounter for general adult medical examination without abnormal findings: Secondary | ICD-10-CM | POA: Diagnosis not present

## 2014-12-03 DIAGNOSIS — Z1389 Encounter for screening for other disorder: Secondary | ICD-10-CM | POA: Diagnosis not present

## 2014-12-03 IMAGING — MG MM SCREENING BREAST TOMO BILATERAL
8 series · 8 of 24 positions shown · non-contrast
Comparison: Previous exam(s).

CLINICAL DATA: Screening.

EXAM:
DIGITAL SCREENING BILATERAL MAMMOGRAM WITH 3D TOMO WITH CAD

[L MLO]
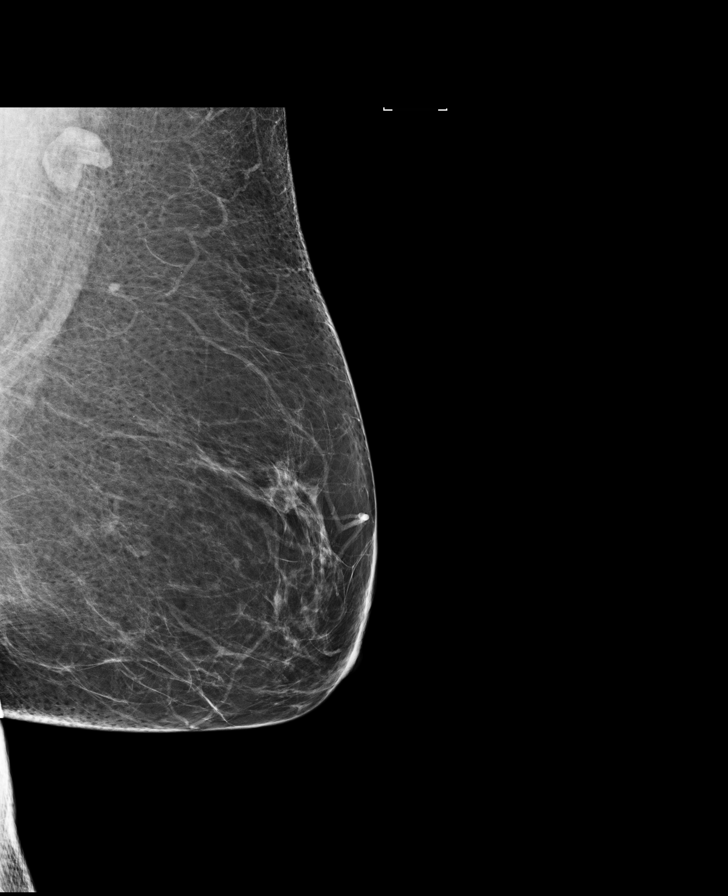

[R MLO]
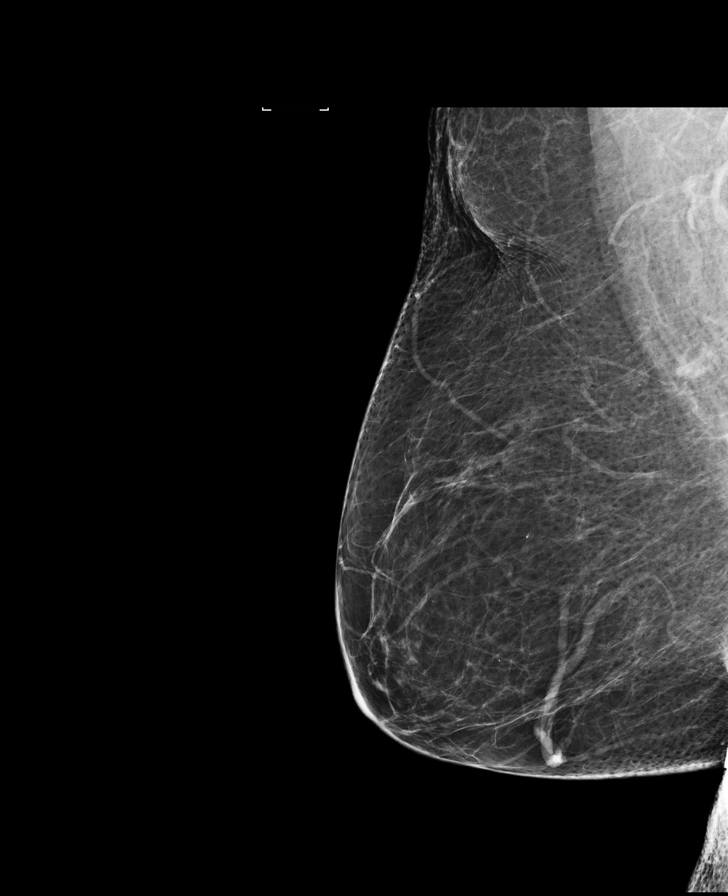

[R CC]
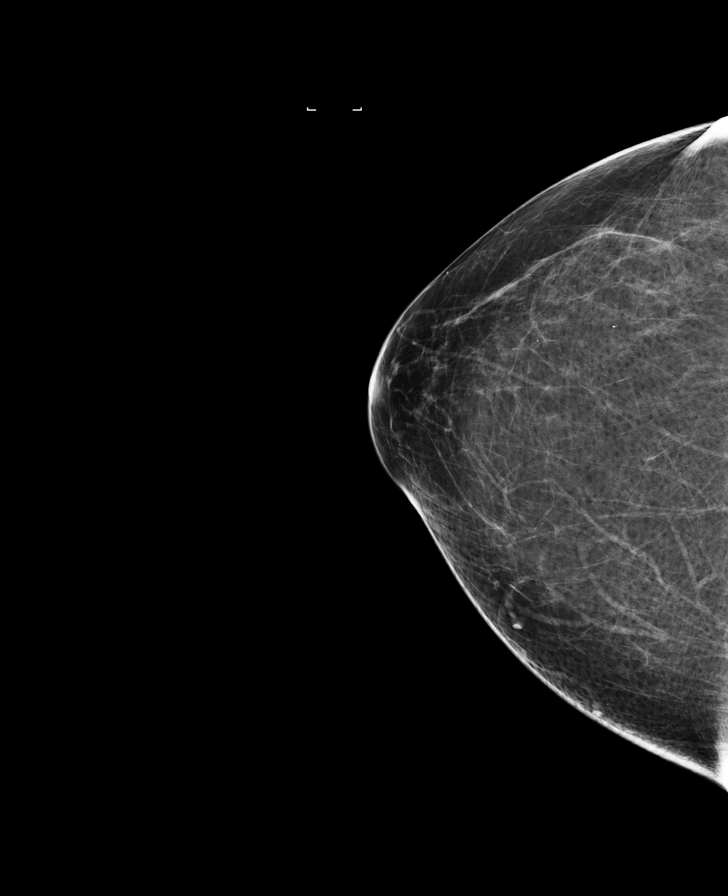

[L CC]
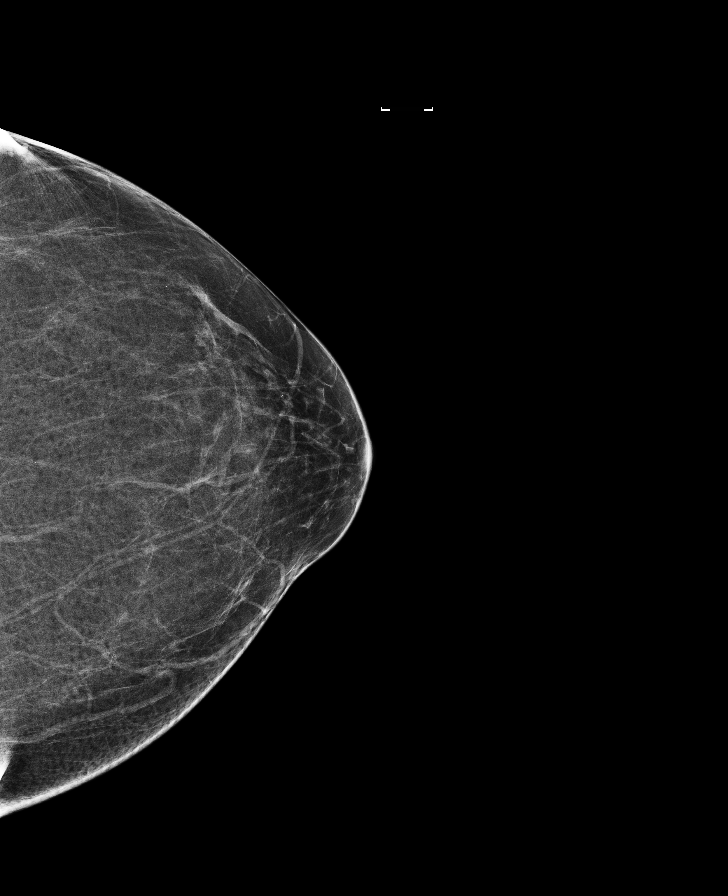

[L MLO tomo · tomo slice 36/71.0]
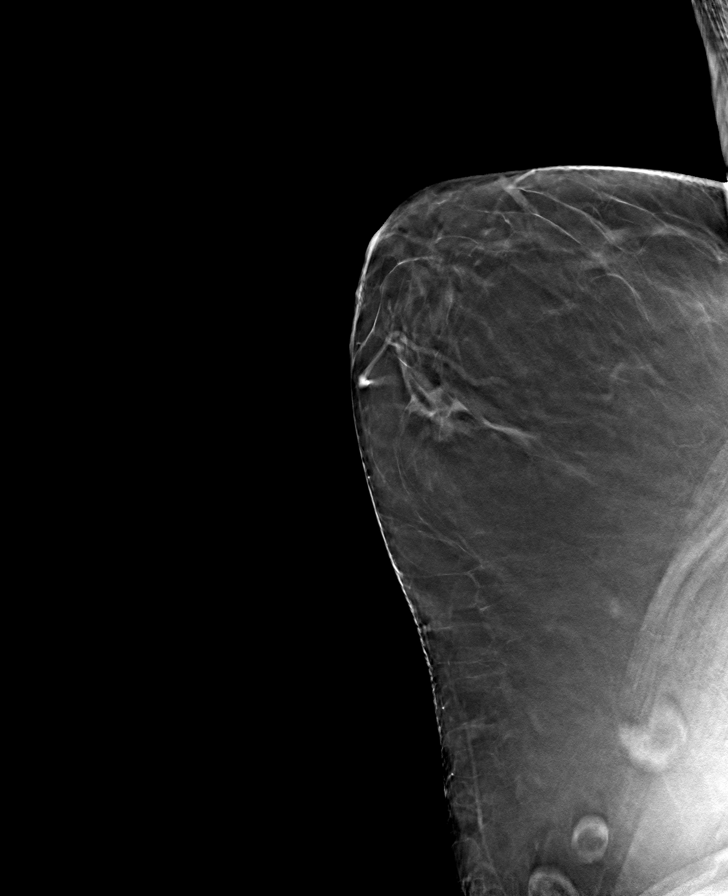

[L CC tomo · tomo slice 31/62.0]
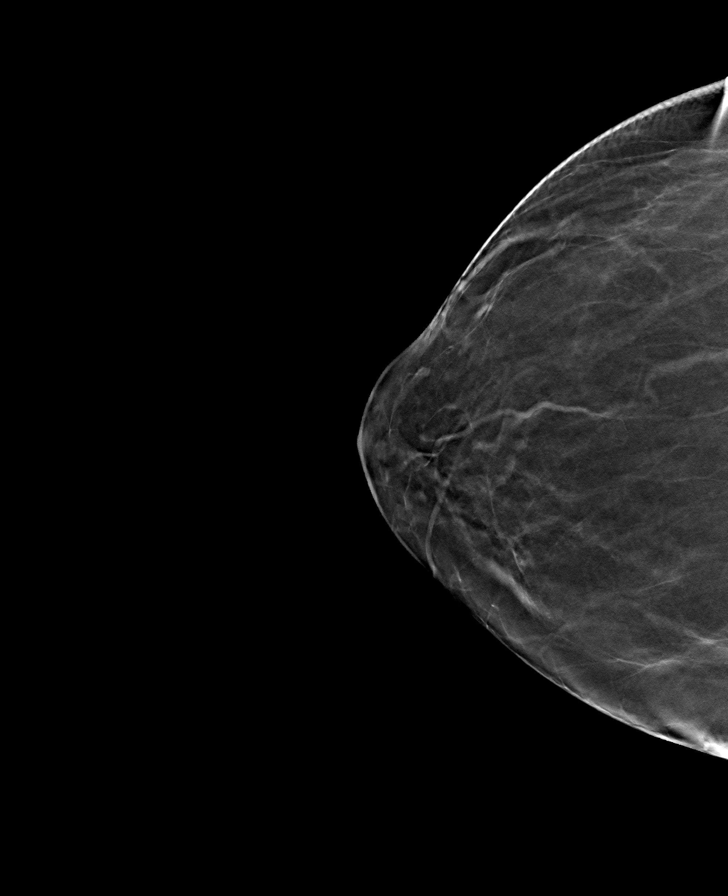

[R MLO tomo · tomo slice 37/74.0]
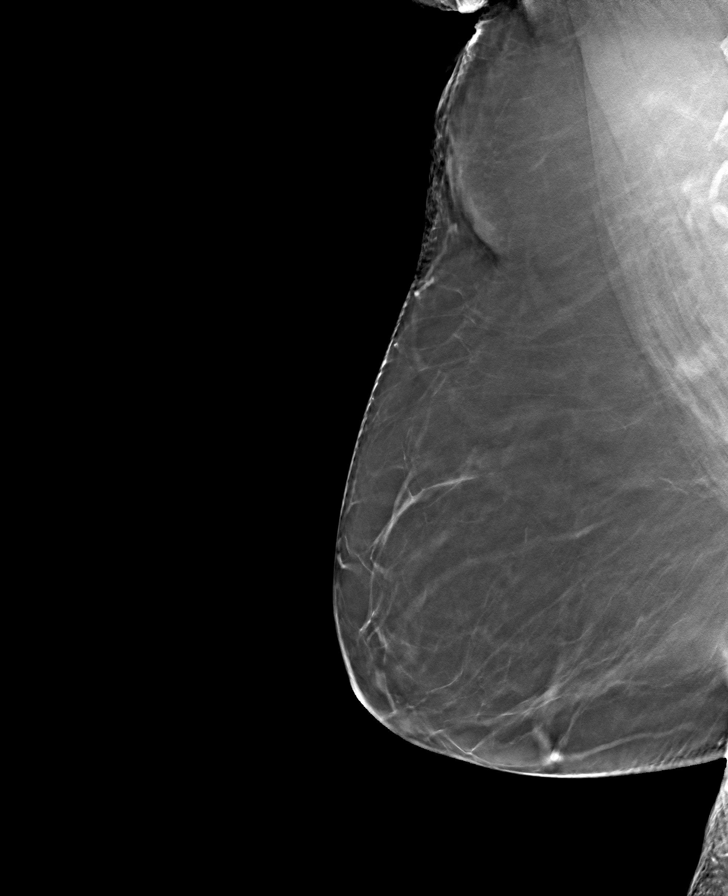

[R CC tomo · tomo slice 35/70.0]
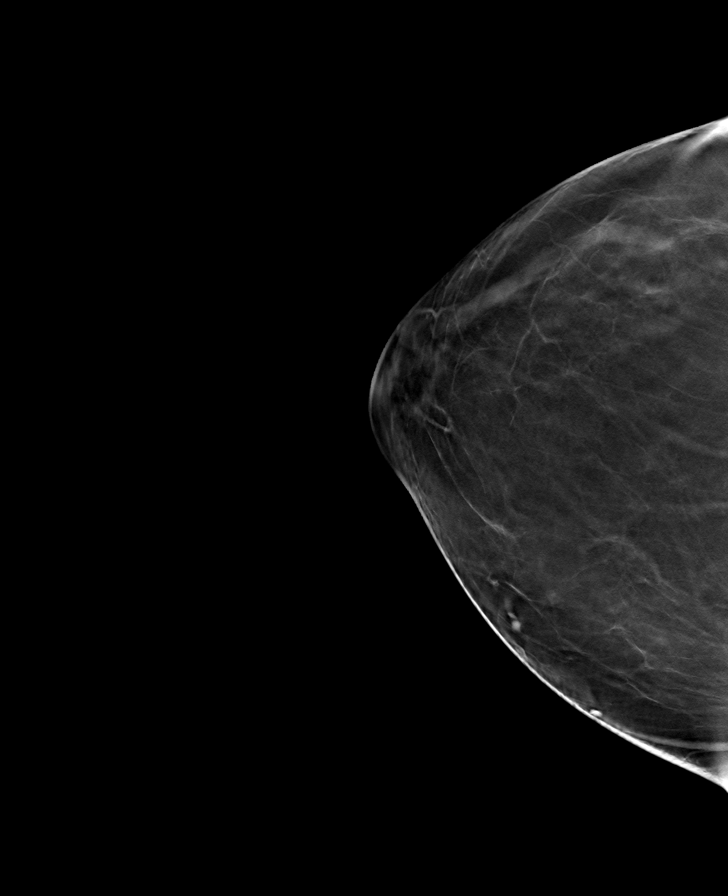

[8 of 24 positions shown; findings below may reference images not displayed]

ACR Breast Density Category b: There are scattered areas of
fibroglandular density.
FINDINGS: There are no findings suspicious for malignancy. Images were
processed with CAD.
IMPRESSION: No mammographic evidence of malignancy. A result letter of this
screening mammogram will be mailed directly to the patient.

RECOMMENDATION:
Screening mammogram in one year. (Code:[F0])

BI-RADS CATEGORY  1: Negative.

## 2014-12-08 DIAGNOSIS — Z1212 Encounter for screening for malignant neoplasm of rectum: Secondary | ICD-10-CM | POA: Diagnosis not present

## 2015-02-22 ENCOUNTER — Encounter (INDEPENDENT_AMBULATORY_CARE_PROVIDER_SITE_OTHER): Payer: Self-pay

## 2015-02-22 ENCOUNTER — Encounter: Payer: Self-pay | Admitting: Internal Medicine

## 2015-02-22 ENCOUNTER — Ambulatory Visit (INDEPENDENT_AMBULATORY_CARE_PROVIDER_SITE_OTHER): Payer: Medicare Other | Admitting: Internal Medicine

## 2015-02-22 VITALS — BP 122/80 | HR 85 | Ht 63.5 in | Wt 188.6 lb

## 2015-02-22 DIAGNOSIS — G4733 Obstructive sleep apnea (adult) (pediatric): Secondary | ICD-10-CM

## 2015-02-22 DIAGNOSIS — J302 Other seasonal allergic rhinitis: Secondary | ICD-10-CM

## 2015-02-22 DIAGNOSIS — J3089 Other allergic rhinitis: Secondary | ICD-10-CM

## 2015-02-22 DIAGNOSIS — J309 Allergic rhinitis, unspecified: Secondary | ICD-10-CM | POA: Diagnosis not present

## 2015-02-22 NOTE — Assessment & Plan Note (Signed)
Pressure of 8 seems appropriate but we can check for download. She complains that CPAP over dries her mouth. Using recommended approaches for this. Mucosa is not particularly dry on exam at this visit Plan-CPAP download

## 2015-02-22 NOTE — Progress Notes (Signed)
Patient ID: [name] [name], female    DOB: 02-09-1941, 74 y.o.   MRN: [id number]  HPI 07/20/11- 33 yoF former smoker- just quit in May. Followed for allergic rhinitis, hypersomnia w/ sleep apnea, complicated by DM, HBP, GERD Last here July 19, 2010- note reviewed Says dx'd pneumonia several weeks ago- treated by Dr Brigitte Pulse- they plan f/u CXR. She recognizes age, and gets DOE climbing steps quicker- moving bedroom downstairs. Chest feels tight- gets sore left base of neck- not exertional. Was coughing- stopped after antibiotic.  Has been very compliant with CPAP 9, [facility]. She is noticing dry mouth x 6 months- bad enough to wake her needing to take it off. The machine is old and she will check with [facility] on replacement. Getting tired again in afternoons, blamed on not sleeping with it as much. Has tried Biotene. Not on drying meds.   08/21/11- 70 yoF former smoker- just quit in May. Followed for allergic rhinitis, hypersomnia w/ sleep apnea, complicated by DM, HBP, GERD Has felt better since here in July. Had f/u CXR from Dr Brigitte Pulse after her pneumonia "everything is fine". Denies cough "much better". Has stayed off cigarettes.  CPAP still has old machine, used every night all night at 9. Did get a new humidifier for it- dry mouth is improved. New mask is full face.  PFT- 08/21/11- minimal obstruction without response to bronchodilator, mild restriction, DLCO mildly reduced. FEV1/FVC 0.76 small airway flows 0.64 before bronchodilator. Aware that her stamina is not good- DOE on hills and stairs, but without chest pain palpitation or swelling.  12/19/11-  46 yoF former smoker- just quit in May, 2012. Followed for allergic rhinitis, hypersomnia w/ sleep apnea, complicated by DM, HBP, GERD Had flu vax. Doing well with her chronic issues.  CPAP- 9- Good compliance and control, used all night every night. Got new machine.  No significant allergy complaints in this season.   12/19/12- 14 yoF former  smoker- just quit in May, 2012. Followed for allergic rhinitis, hypersomnia w/ sleep apnea, complicated by DM, HBP, GERD FOLLOWS FOR: had to start using [facility] now for CPAP-needs new machine through [facility] and have download compliance report and see [place] in 90 days of report for insurance purposes.  CPAP 9/ [facility] being changed to [facility] due to insurance coverage.  Some persistent nasal congestion but no sinus infection. Her primary physician gets her chest x-rays.  02/20/13- 55 yoF former smoker- just quit in May, 2012. Followed for allergic rhinitis, hypersomnia w/ sleep apnea, complicated by DM, HBP, GERD FOLLOWS FOR: wears CPAP 9/ [facility] every night for about 7-9 hours; pressure working well for patient. Has new machine, sleeps well and takes fewer naps. Allergic rhinitis is well controlled now before starting the spring season.  02/22/13- 53 yoF former smoker- just quit in May, 2012. Followed for allergic rhinitis, hypersomnia w/ sleep apnea, complicated by DM, HBP, GERD FOLLOWS FOR: Wearing CPAP 9/ [facility] every night 5-8 hours. States pressure  is good. Complains of dry mouth despite Biotene, humidifier..   02/22/15- 74 yoF former smoker- just quit in May, 2012. Followed for allergic rhinitis, hypersomnia w/ sleep apnea, complicated by DM, HBP, GERD FOLLOWS FOR: wears CPAP 8/ [facility] nightly 5-8 hrs DME [facility]. She says she has dry mouth. She is not having any other complaints. We had reduce pressure to 8 at last visit hoping to improve dry mouth complaint with no difference noted. She continues  Biotene. Not currently using her room humidifier and keeps bedroom  cool.  Review of Systems- see HPI Constitutional:   No-   weight loss, night sweats, fevers, chills, fatigue, lassitude. HEENT:   No-   headaches, difficulty swallowing, tooth/dental problems, sore throat,                  No-   sneezing, itching, ear ache, no-nasal congestion, post nasal drip,  CV:  No-   chest pain, orthopnea, PND,  swelling in lower extremities, anasarca, dizziness, palpitations GI:  No-   heartburn, indigestion, abdominal pain, nausea, vomiting,  Resp:   No-  excess mucus,             No-   productive cough,  No non-productive cough,  No-  coughing up of blood.              No-   change in color of mucus.  No- wheezing.   Skin: No-   rash or lesions. GU:  MS:  No-   joint pain or swelling.  Psych:  No- change in mood or affect. No depression or anxiety.  No memory loss.   Objective:   Physical Exam General- Alert, Oriented, Affect-appropriate, Distress- none acute, +overweight Skin- rash-none, lesions- none, excoriation- none Lymphadenopathy- none Head- atraumatic            Eyes- Gross vision intact, PERRLA, conjunctivae clear secretions            Ears- Hearing, canals-normal            Nose- Clear, no-Septal dev, mucus, polyps, erosion, perforation             Throat- Mallampati III-IV , mucosa-not unusually dry , drainage- none,                     tonsils- atrophic Neck- flexible , trachea midline, no stridor , thyroid nl, carotid no bruit Chest - symmetrical excursion , unlabored           Heart/CV- RRR , no murmur , no gallop  , no rub, nl s1 s2                           - JVD- none , edema- none, stasis changes- none, varices- none           Lung- + trace basilar crackles, unlabored, no-wheezing, dullness-none,                      rub- none           Chest wall-  Abd-  Br/ Gen/ Rectal- Not done, not indicated Extrem- cyanosis- none, clubbing, none, atrophy- none, strength- nl Neuro- grossly intact to observation

## 2015-02-22 NOTE — Assessment & Plan Note (Signed)
Spring pollens are starting to rise. We discussed available OTC therapies.

## 2015-02-22 NOTE — Patient Instructions (Signed)
Order- DME [facility] download CPAP for pressure compliance dx OSA  Please call as needed

## 2015-03-03 ENCOUNTER — Telehealth: Payer: Self-pay | Admitting: Internal Medicine

## 2015-03-03 NOTE — Telephone Encounter (Signed)
Spoke with pt. States that she is having issues with her allergies. Reports sinus congestion, dry cough, sore throat, PND and runny nose. Mucus is clear. Would like to have something called in.  No Known Allergies  CY - please advise.  Thanks.

## 2015-03-03 NOTE — Telephone Encounter (Signed)
Spoke with pt and advised of Dr Janee Morn recommendations.  Pt verbalized understanding.

## 2015-03-03 NOTE — Telephone Encounter (Signed)
Suggest starting with Flonase otc  2 puffs each nostril twice daily,  Plus   otc daily antihistamine like Allegra or Claritin

## 2015-04-18 ENCOUNTER — Other Ambulatory Visit: Payer: Self-pay | Admitting: Dermatology

## 2015-04-18 DIAGNOSIS — Z85828 Personal history of other malignant neoplasm of skin: Secondary | ICD-10-CM | POA: Diagnosis not present

## 2015-04-18 DIAGNOSIS — C44729 Squamous cell carcinoma of skin of left lower limb, including hip: Secondary | ICD-10-CM | POA: Diagnosis not present

## 2015-04-18 DIAGNOSIS — L821 Other seborrheic keratosis: Secondary | ICD-10-CM | POA: Diagnosis not present

## 2015-04-18 DIAGNOSIS — H61002 Unspecified perichondritis of left external ear: Secondary | ICD-10-CM | POA: Diagnosis not present

## 2015-04-18 DIAGNOSIS — D485 Neoplasm of uncertain behavior of skin: Secondary | ICD-10-CM | POA: Diagnosis not present

## 2015-04-18 DIAGNOSIS — L570 Actinic keratosis: Secondary | ICD-10-CM | POA: Diagnosis not present

## 2015-04-18 DIAGNOSIS — L565 Disseminated superficial actinic porokeratosis (DSAP): Secondary | ICD-10-CM | POA: Diagnosis not present

## 2015-04-19 ENCOUNTER — Encounter: Payer: Self-pay | Admitting: Internal Medicine

## 2015-08-25 DIAGNOSIS — R21 Rash and other nonspecific skin eruption: Secondary | ICD-10-CM | POA: Diagnosis not present

## 2015-08-25 DIAGNOSIS — G3184 Mild cognitive impairment, so stated: Secondary | ICD-10-CM | POA: Diagnosis not present

## 2015-08-25 DIAGNOSIS — E1149 Type 2 diabetes mellitus with other diabetic neurological complication: Secondary | ICD-10-CM | POA: Diagnosis not present

## 2015-08-25 DIAGNOSIS — E1129 Type 2 diabetes mellitus with other diabetic kidney complication: Secondary | ICD-10-CM | POA: Diagnosis not present

## 2015-08-25 DIAGNOSIS — R682 Dry mouth, unspecified: Secondary | ICD-10-CM | POA: Diagnosis not present

## 2015-08-25 DIAGNOSIS — N183 Chronic kidney disease, stage 3 (moderate): Secondary | ICD-10-CM | POA: Diagnosis not present

## 2015-08-25 DIAGNOSIS — K146 Glossodynia: Secondary | ICD-10-CM | POA: Diagnosis not present

## 2015-08-25 DIAGNOSIS — E785 Hyperlipidemia, unspecified: Secondary | ICD-10-CM | POA: Diagnosis not present

## 2015-08-25 DIAGNOSIS — Z6831 Body mass index (BMI) 31.0-31.9, adult: Secondary | ICD-10-CM | POA: Diagnosis not present

## 2015-08-25 DIAGNOSIS — I10 Essential (primary) hypertension: Secondary | ICD-10-CM | POA: Diagnosis not present

## 2015-08-30 DIAGNOSIS — D485 Neoplasm of uncertain behavior of skin: Secondary | ICD-10-CM | POA: Diagnosis not present

## 2015-08-30 DIAGNOSIS — C44722 Squamous cell carcinoma of skin of right lower limb, including hip: Secondary | ICD-10-CM | POA: Diagnosis not present

## 2015-08-30 DIAGNOSIS — L821 Other seborrheic keratosis: Secondary | ICD-10-CM | POA: Diagnosis not present

## 2015-08-30 DIAGNOSIS — Q828 Other specified congenital malformations of skin: Secondary | ICD-10-CM | POA: Diagnosis not present

## 2015-08-30 DIAGNOSIS — L565 Disseminated superficial actinic porokeratosis (DSAP): Secondary | ICD-10-CM | POA: Diagnosis not present

## 2015-08-30 DIAGNOSIS — Z85828 Personal history of other malignant neoplasm of skin: Secondary | ICD-10-CM | POA: Diagnosis not present

## 2015-10-13 DIAGNOSIS — L281 Prurigo nodularis: Secondary | ICD-10-CM | POA: Diagnosis not present

## 2015-10-13 DIAGNOSIS — C44722 Squamous cell carcinoma of skin of right lower limb, including hip: Secondary | ICD-10-CM | POA: Diagnosis not present

## 2015-10-13 DIAGNOSIS — D485 Neoplasm of uncertain behavior of skin: Secondary | ICD-10-CM | POA: Diagnosis not present

## 2015-10-13 DIAGNOSIS — Z85828 Personal history of other malignant neoplasm of skin: Secondary | ICD-10-CM | POA: Diagnosis not present

## 2015-10-19 DIAGNOSIS — C44722 Squamous cell carcinoma of skin of right lower limb, including hip: Secondary | ICD-10-CM | POA: Diagnosis not present

## 2015-10-19 DIAGNOSIS — Z85828 Personal history of other malignant neoplasm of skin: Secondary | ICD-10-CM | POA: Diagnosis not present

## 2015-11-02 DIAGNOSIS — C44722 Squamous cell carcinoma of skin of right lower limb, including hip: Secondary | ICD-10-CM | POA: Diagnosis not present

## 2015-12-02 DIAGNOSIS — R8290 Unspecified abnormal findings in urine: Secondary | ICD-10-CM | POA: Diagnosis not present

## 2015-12-02 DIAGNOSIS — N390 Urinary tract infection, site not specified: Secondary | ICD-10-CM | POA: Diagnosis not present

## 2015-12-02 DIAGNOSIS — E1149 Type 2 diabetes mellitus with other diabetic neurological complication: Secondary | ICD-10-CM | POA: Diagnosis not present

## 2015-12-02 DIAGNOSIS — E784 Other hyperlipidemia: Secondary | ICD-10-CM | POA: Diagnosis not present

## 2015-12-02 DIAGNOSIS — I10 Essential (primary) hypertension: Secondary | ICD-10-CM | POA: Diagnosis not present

## 2015-12-02 DIAGNOSIS — E1129 Type 2 diabetes mellitus with other diabetic kidney complication: Secondary | ICD-10-CM | POA: Diagnosis not present

## 2015-12-06 DIAGNOSIS — Z6832 Body mass index (BMI) 32.0-32.9, adult: Secondary | ICD-10-CM | POA: Diagnosis not present

## 2015-12-06 DIAGNOSIS — D6489 Other specified anemias: Secondary | ICD-10-CM | POA: Diagnosis not present

## 2015-12-06 DIAGNOSIS — N183 Chronic kidney disease, stage 3 (moderate): Secondary | ICD-10-CM | POA: Diagnosis not present

## 2015-12-06 DIAGNOSIS — Z0000 Encounter for general adult medical examination without abnormal findings: Secondary | ICD-10-CM | POA: Diagnosis not present

## 2015-12-06 DIAGNOSIS — D72829 Elevated white blood cell count, unspecified: Secondary | ICD-10-CM | POA: Diagnosis not present

## 2015-12-06 DIAGNOSIS — I10 Essential (primary) hypertension: Secondary | ICD-10-CM | POA: Diagnosis not present

## 2015-12-06 DIAGNOSIS — E1129 Type 2 diabetes mellitus with other diabetic kidney complication: Secondary | ICD-10-CM | POA: Diagnosis not present

## 2015-12-06 DIAGNOSIS — E784 Other hyperlipidemia: Secondary | ICD-10-CM | POA: Diagnosis not present

## 2015-12-06 DIAGNOSIS — Z23 Encounter for immunization: Secondary | ICD-10-CM | POA: Diagnosis not present

## 2015-12-06 DIAGNOSIS — E559 Vitamin D deficiency, unspecified: Secondary | ICD-10-CM | POA: Diagnosis not present

## 2015-12-06 DIAGNOSIS — R0609 Other forms of dyspnea: Secondary | ICD-10-CM | POA: Diagnosis not present

## 2015-12-06 DIAGNOSIS — G4733 Obstructive sleep apnea (adult) (pediatric): Secondary | ICD-10-CM | POA: Diagnosis not present

## 2015-12-06 DIAGNOSIS — Z1389 Encounter for screening for other disorder: Secondary | ICD-10-CM | POA: Diagnosis not present

## 2015-12-06 DIAGNOSIS — M859 Disorder of bone density and structure, unspecified: Secondary | ICD-10-CM | POA: Diagnosis not present

## 2015-12-07 ENCOUNTER — Other Ambulatory Visit (HOSPITAL_COMMUNITY): Payer: Self-pay | Admitting: Respiratory Therapy

## 2015-12-07 DIAGNOSIS — R0609 Other forms of dyspnea: Principal | ICD-10-CM

## 2015-12-07 DIAGNOSIS — R0600 Dyspnea, unspecified: Secondary | ICD-10-CM

## 2015-12-08 DIAGNOSIS — Z1212 Encounter for screening for malignant neoplasm of rectum: Secondary | ICD-10-CM | POA: Diagnosis not present

## 2015-12-09 ENCOUNTER — Ambulatory Visit (HOSPITAL_COMMUNITY)
Admission: RE | Admit: 2015-12-09 | Discharge: 2015-12-09 | Disposition: A | Discharge status: Home / Self Care | Payer: Medicare Other | Source: Ambulatory Visit | Attending: Internal Medicine | Admitting: Internal Medicine

## 2015-12-09 DIAGNOSIS — R0609 Other forms of dyspnea: Secondary | ICD-10-CM | POA: Diagnosis not present

## 2015-12-12 LAB — PULMONARY FUNCTION TEST
DL/VA % pred: 72 %
DL/VA: 3.41 ml/min/mmHg/L
DLCO unc % pred: 42 %
DLCO unc: 9.75 ml/min/mmHg
FEF 25-75 PRE: 0.89 L/s
FEF2575-%PRED-PRE: 55 %
FEV1-%Pred-Pre: 58 %
FEV1-PRE: 1.18 L
FEV1FVC-%PRED-PRE: 98 %
FEV6-%PRED-PRE: 62 %
FEV6-PRE: 1.6 L
FEV6FVC-%Pred-Pre: 105 %
FVC-%Pred-Pre: 59 %
FVC-Pre: 1.6 L
PRE FEV1/FVC RATIO: 74 %
Pre FEV6/FVC Ratio: 100 %
RV % PRED: 90 %
RV: 2.02 L
TLC % PRED: 81 %
TLC: 3.98 L

## 2015-12-15 ENCOUNTER — Other Ambulatory Visit: Payer: Self-pay | Admitting: Internal Medicine

## 2015-12-15 DIAGNOSIS — J449 Chronic obstructive pulmonary disease, unspecified: Secondary | ICD-10-CM

## 2015-12-16 ENCOUNTER — Ambulatory Visit
Admission: RE | Admit: 2015-12-16 | Discharge: 2015-12-16 | Disposition: A | Discharge status: Home / Self Care | Payer: Medicare Other | Source: Ambulatory Visit | Attending: Internal Medicine | Admitting: Internal Medicine

## 2015-12-16 DIAGNOSIS — J449 Chronic obstructive pulmonary disease, unspecified: Secondary | ICD-10-CM | POA: Diagnosis not present

## 2015-12-16 IMAGING — CT CT CHEST W/O CM
3 of 4 series · 16 of 30 positions shown, 18 images · non-contrast
Comparison: CT Abdomen and Pelvis [DATE] and earlier. Chest CT
report [DATE] (no images available).

CLINICAL DATA: 74-year-old female with COPD, abnormal lung function
test. Cough, shortness of breath. Stopped smoking this week. Initial
encounter.

EXAM:
CT CHEST WITHOUT CONTRAST
TECHNIQUE: Multidetector CT imaging of the chest was performed following the
standard protocol without IV contrast.

[Series 3: chest w/o · axial · non-contrast · 0.68mm/px · z∈[-243,-48]mm · 5 of 59 slices shown, 7 images]
[im 10/59  mediastinal]
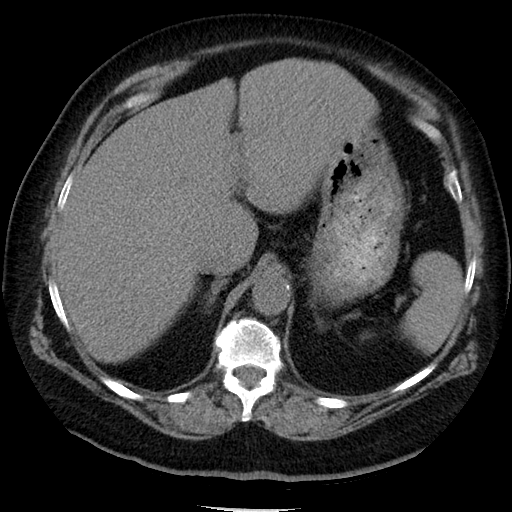
[im 10/59  lung]
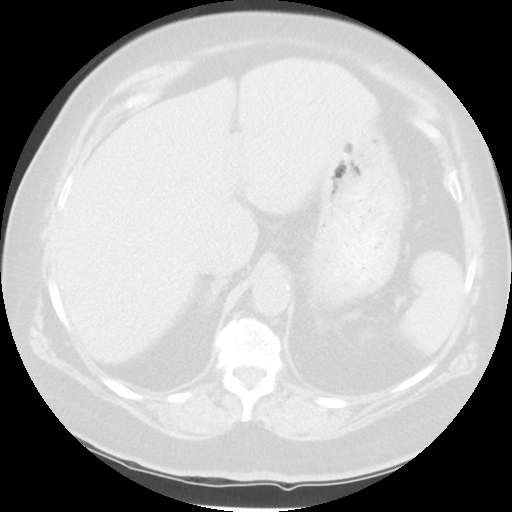
[im 20/59  lung]
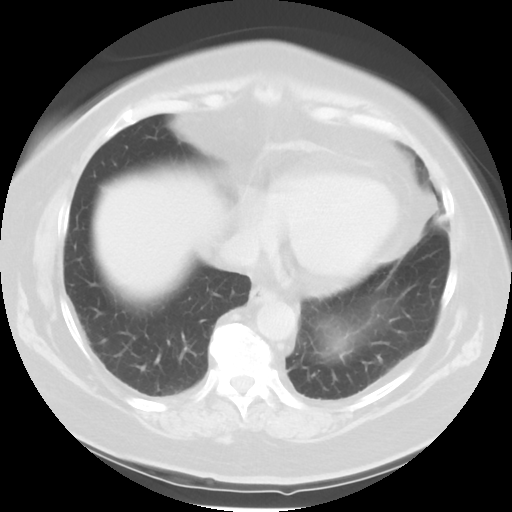
[im 30/59  lung]
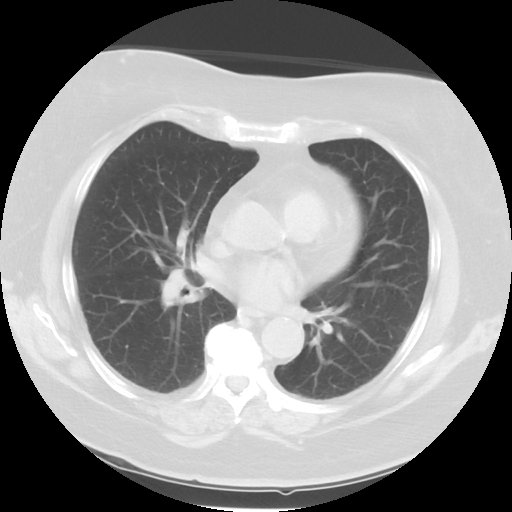
[im 39/59  lung]
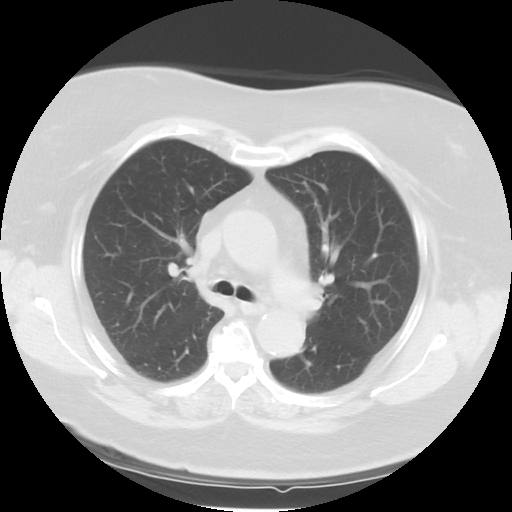
[im 49/59  mediastinal]
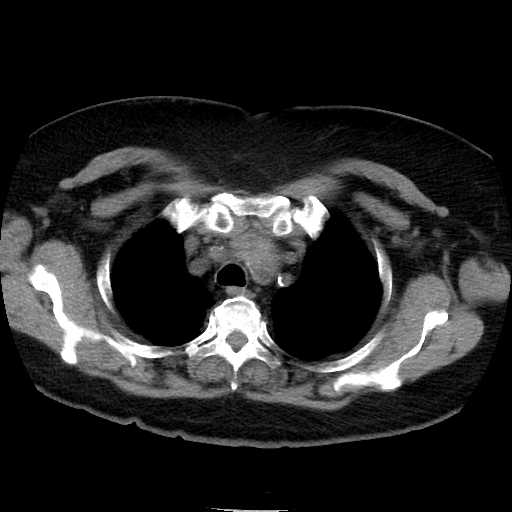
[im 49/59  lung]
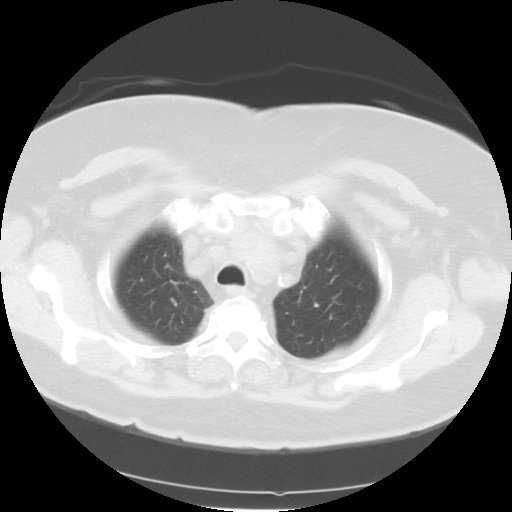

[Series 4: lung windows · axial · 0.68mm/px · z∈[-233,-58]mm · 4 of 59 slices shown]
[im 12/59  lung]
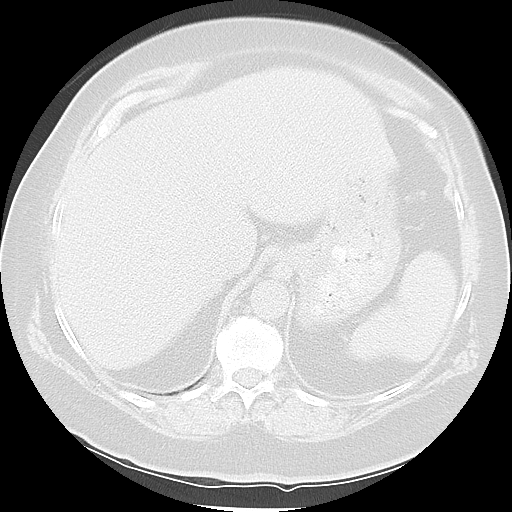
[im 24/59  lung]
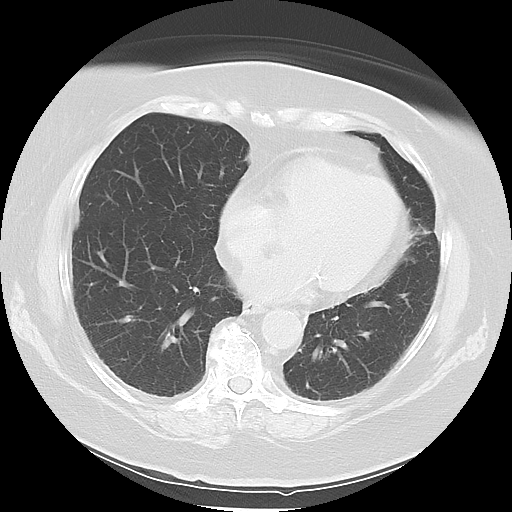
[im 35/59  lung]
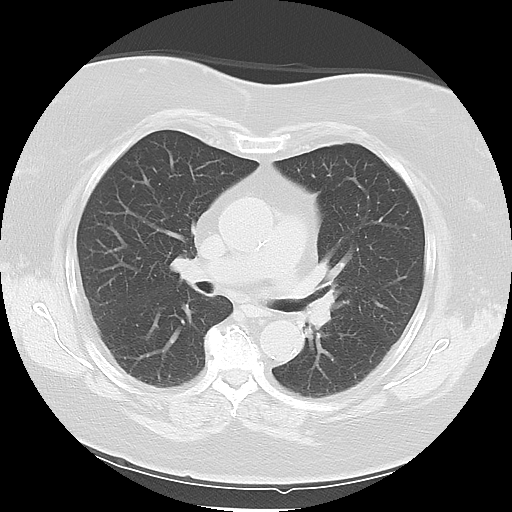
[im 47/59  lung]
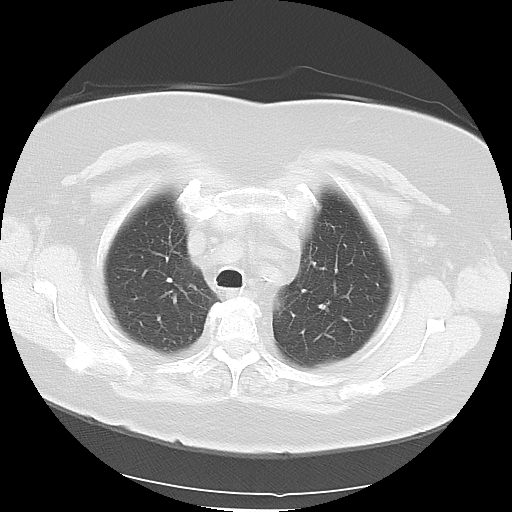

[Series 602: sagittal body · sagittal · 0.68mm/px · 7 of 140 slices shown]
[im 10/140  mediastinal]
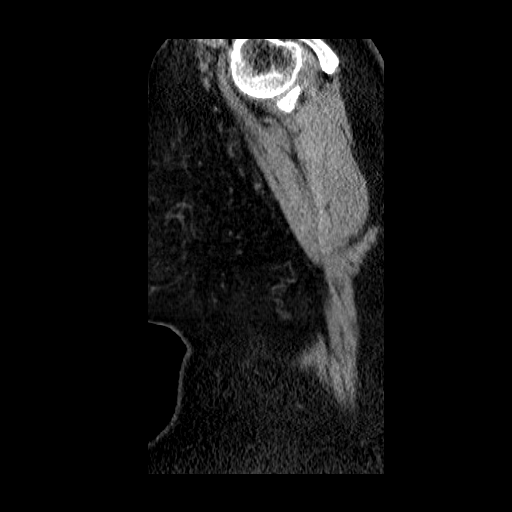
[im 30/140  mediastinal]
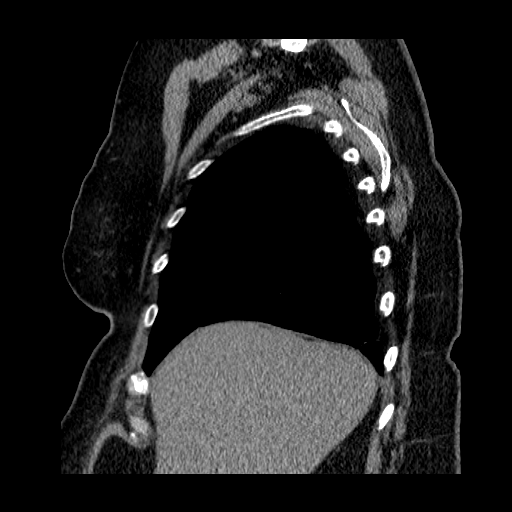
[im 50/140  mediastinal]
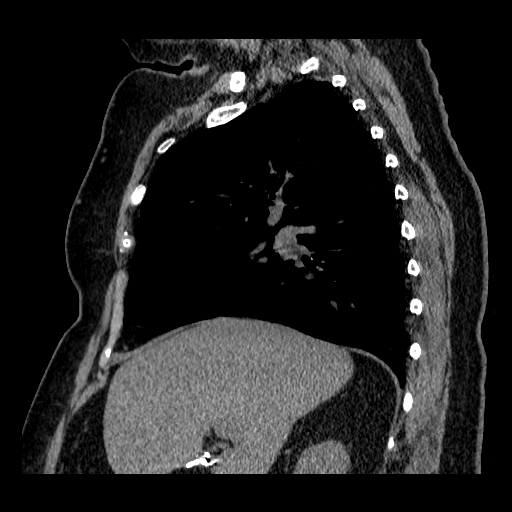
[im 60/140  mediastinal]
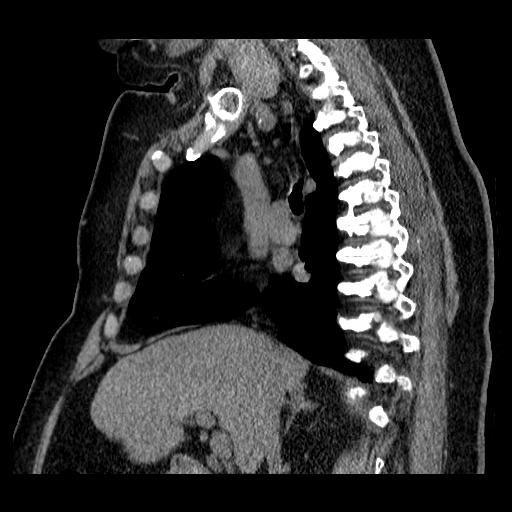
[im 80/140  mediastinal]
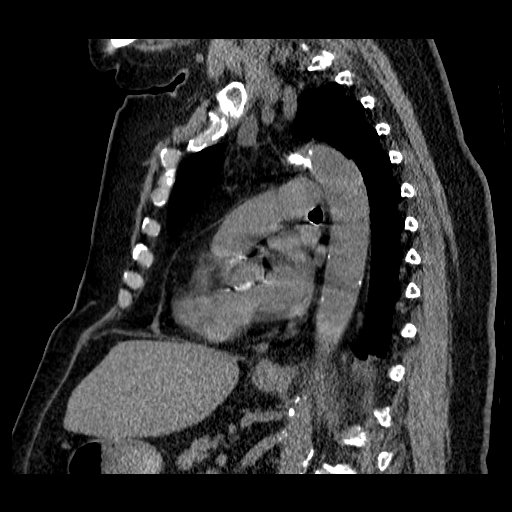
[im 90/140  mediastinal]
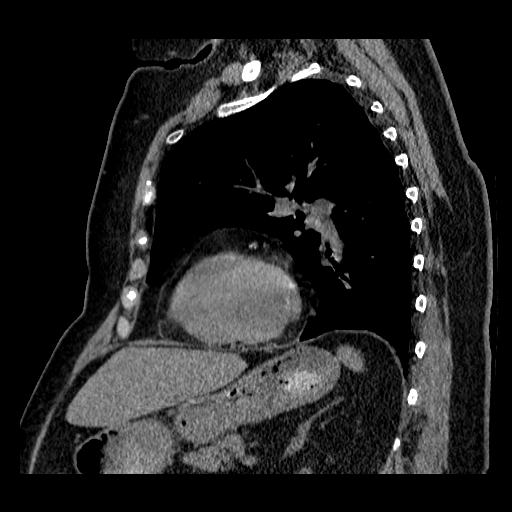
[im 110/140  mediastinal]
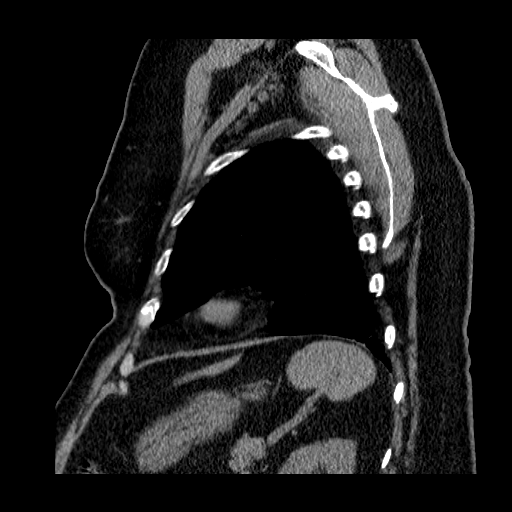

[16 of 30 positions shown; findings below may reference images not displayed]

FINDINGS: Major airways are patent. No pleural effusion. Minimal atelectasis
or scarring in the right costophrenic angle, otherwise no abnormal
right lung opacity. Chronic scarring and atelectasis in the lingula
at the left anterior costophrenic angle with progressed
opacification compared to [4H], but appears similar to a [DATE]
[HOSPITAL] CT Abdomen and Pelvis. There is
associated distal peribronchial thickening. See series 4, images
37-39. No other abnormal left lung opacity.

No pericardial effusion. Calcified aortic and Coronary artery
atherosclerosis. Calcified great vessel origins. No mediastinal or
hilar lymphadenopathy. No axillary lymphadenopathy. Thyromegaly.
Partially calcified 16 mm inferior left lobe nodule on series 3,
image 10 meets size criteria for ultrasound follow-up. No
surrounding lymphadenopathy.

Surgically absent gallbladder. Visible non contrast liver, spleen,
pancreas, adrenal glands, and bowel are within normal limits.
Chronic right nephrolithiasis partially re- identified. Calcified
abdominal aortic atherosclerosis.

Osteopenia.   No acute osseous abnormality identified.
IMPRESSION: 1. Suspect recurrent infection/small bronchopneumonia in the
peripheral lingula in an area of chronic scarring and airway
thickening. No associated pleural effusion. Post treatment chest
radiograph follow-up should suffice.
2. No other abnormal pulmonary opacity.
3. Thyromegaly with left lobe nodule which meets consensus criteria
for routine Thyroid Ultrasound follow-up.
4. Calcified aortic and coronary artery atherosclerosis.
5. Chronic nephrolithiasis.

## 2015-12-22 ENCOUNTER — Other Ambulatory Visit: Payer: Self-pay | Admitting: Internal Medicine

## 2015-12-22 DIAGNOSIS — E041 Nontoxic single thyroid nodule: Secondary | ICD-10-CM

## 2015-12-27 ENCOUNTER — Ambulatory Visit
Admission: RE | Admit: 2015-12-27 | Discharge: 2015-12-27 | Disposition: A | Discharge status: Home / Self Care | Payer: Medicare Other | Source: Ambulatory Visit | Attending: Internal Medicine | Admitting: Internal Medicine

## 2015-12-27 DIAGNOSIS — E042 Nontoxic multinodular goiter: Secondary | ICD-10-CM | POA: Diagnosis not present

## 2015-12-27 DIAGNOSIS — E041 Nontoxic single thyroid nodule: Secondary | ICD-10-CM

## 2015-12-27 IMAGING — US US SOFT TISSUE HEAD/NECK
1 series · 14 of 25 positions shown · non-contrast
Comparison: None.

CLINICAL DATA: Thyroid nodule noted on CT chest.

EXAM:
THYROID ULTRASOUND
TECHNIQUE: Ultrasound examination of the thyroid gland and adjacent soft
tissues was performed.

[Series 1: us soft tissue head/neck · 0.08mm/px · 14 of 57 slices shown]
[im 1/57]
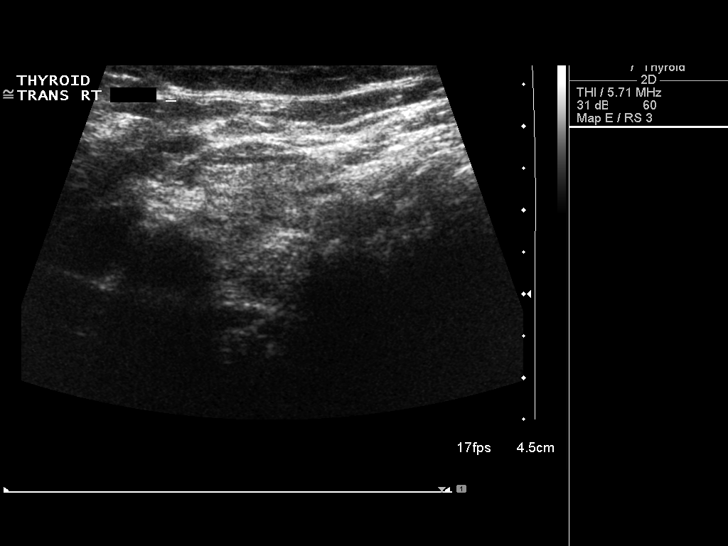
[im 5/57]
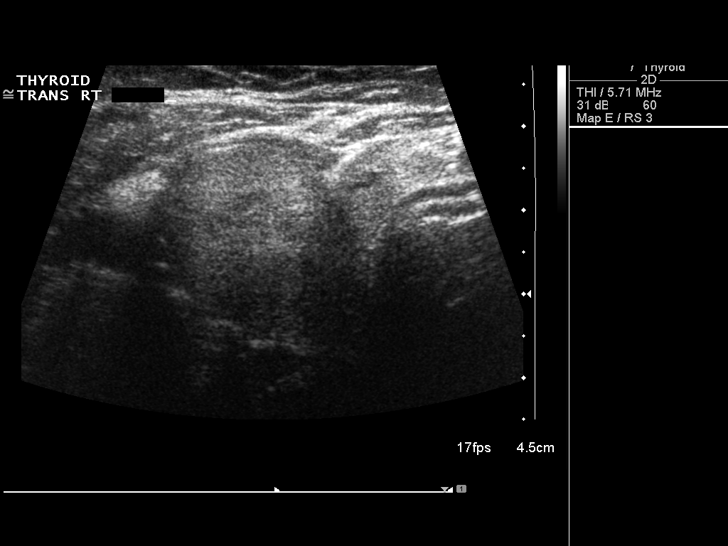
[im 10/57]
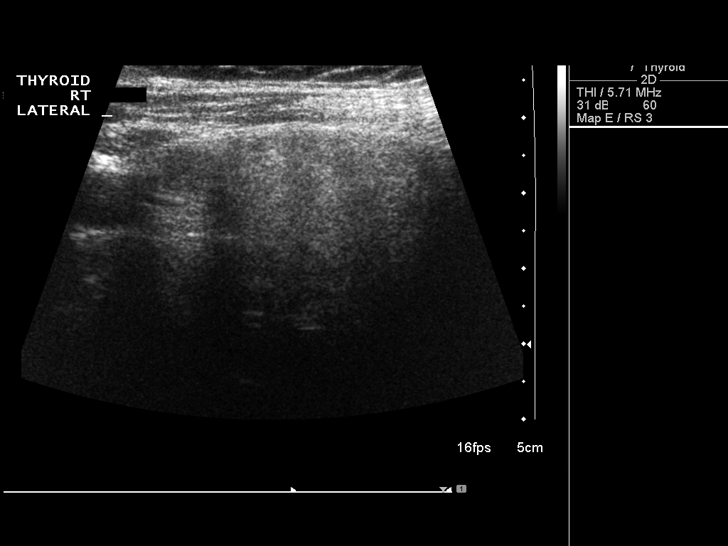
[im 15/57]
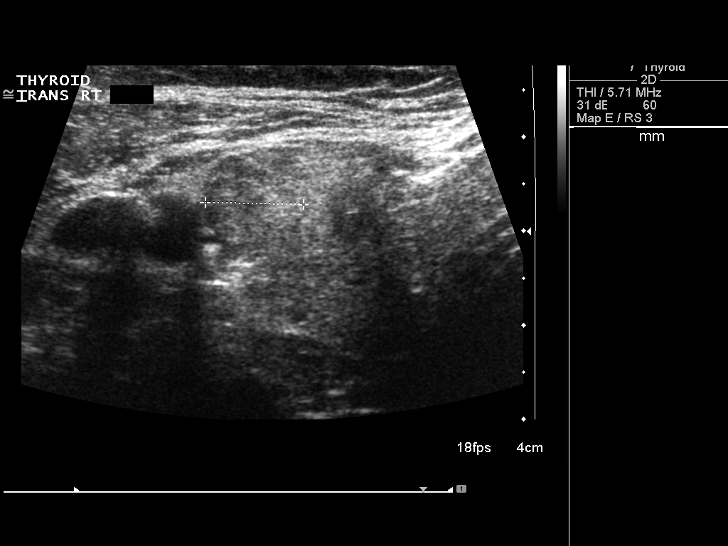
[im 19/57]
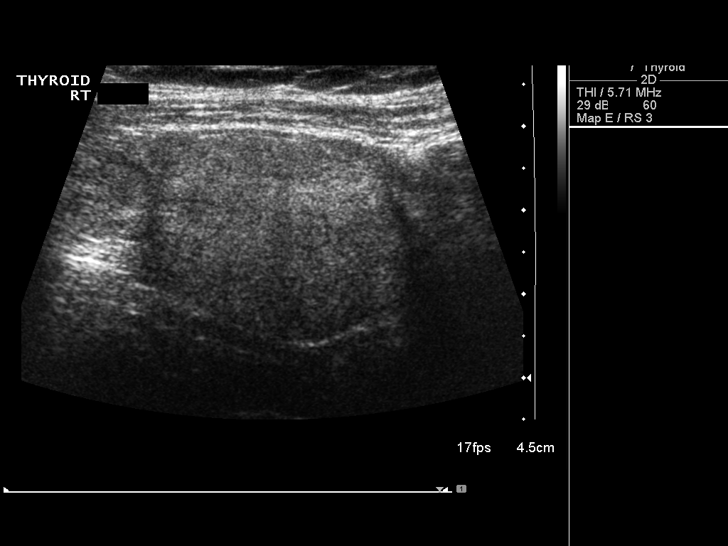
[im 22/57]
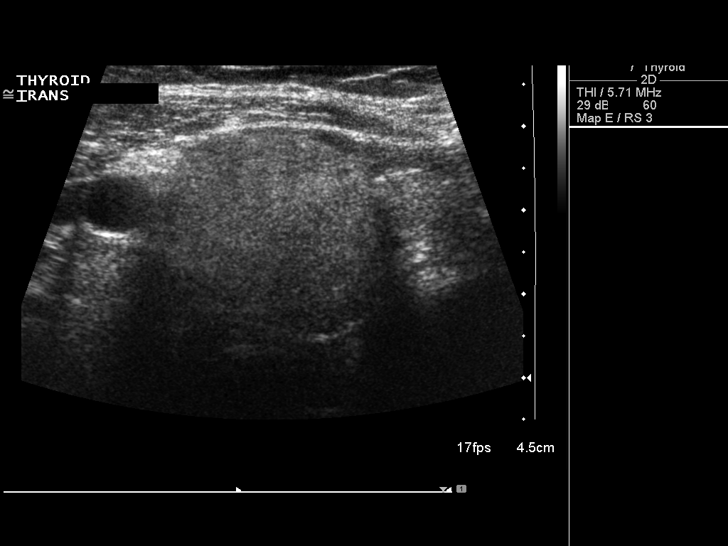
[im 26/57]
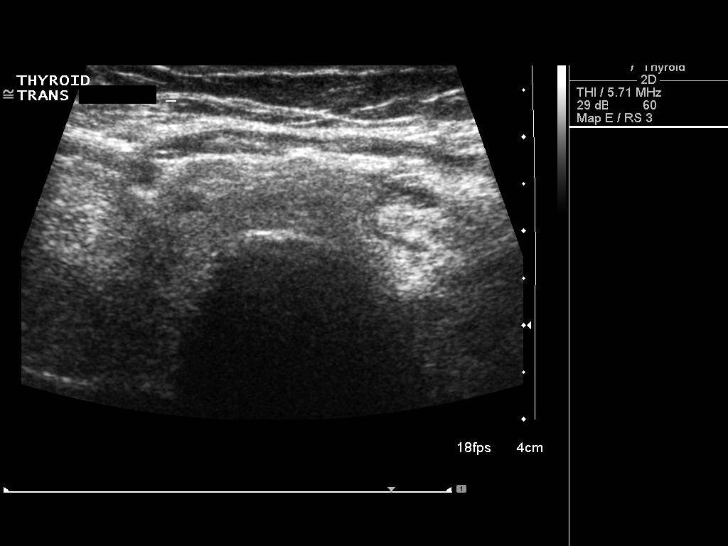
[im 31/57]
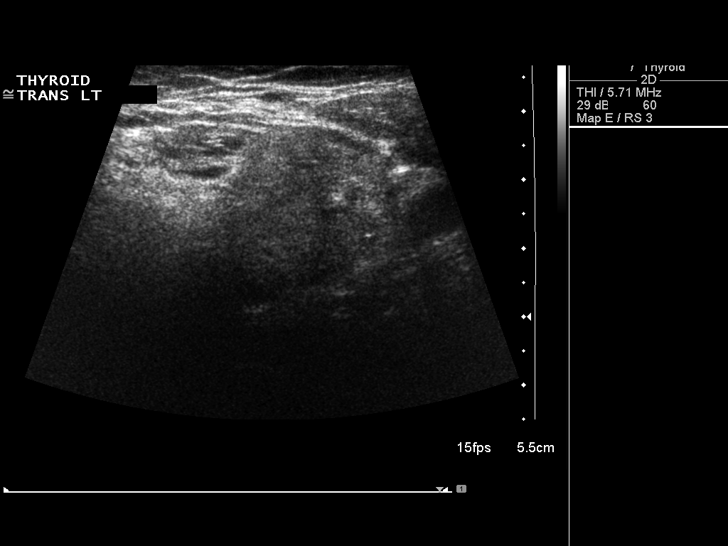
[im 36/57]
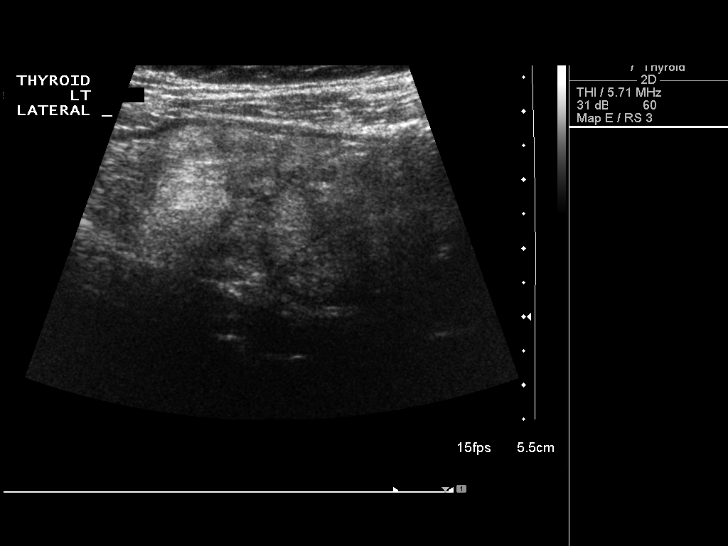
[im 38/57]
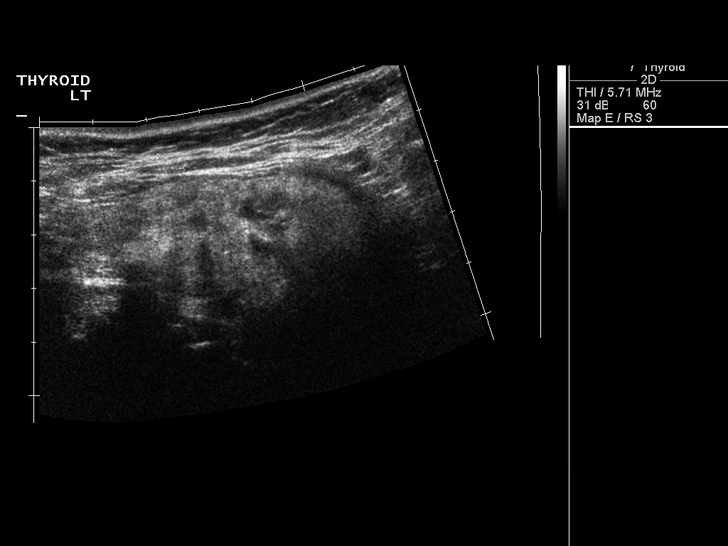
[im 43/57]
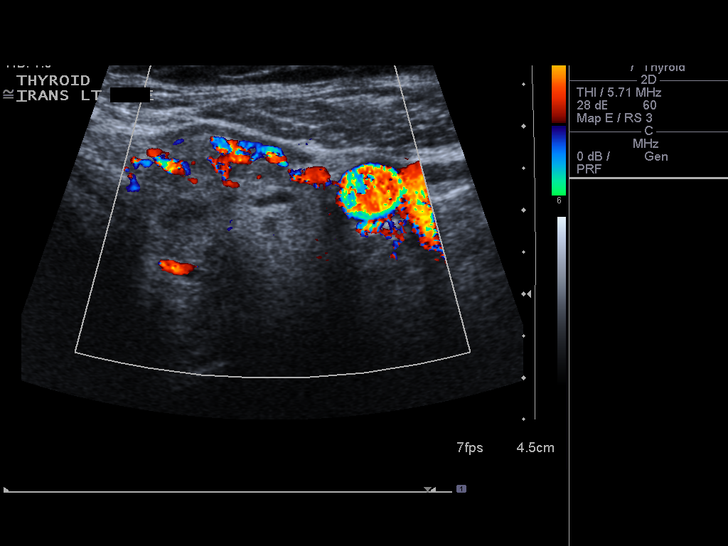
[im 47/57]
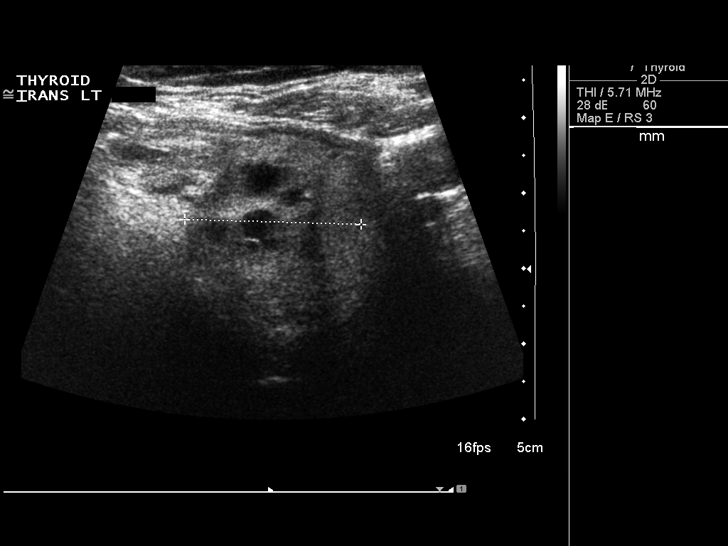
[im 52/57]
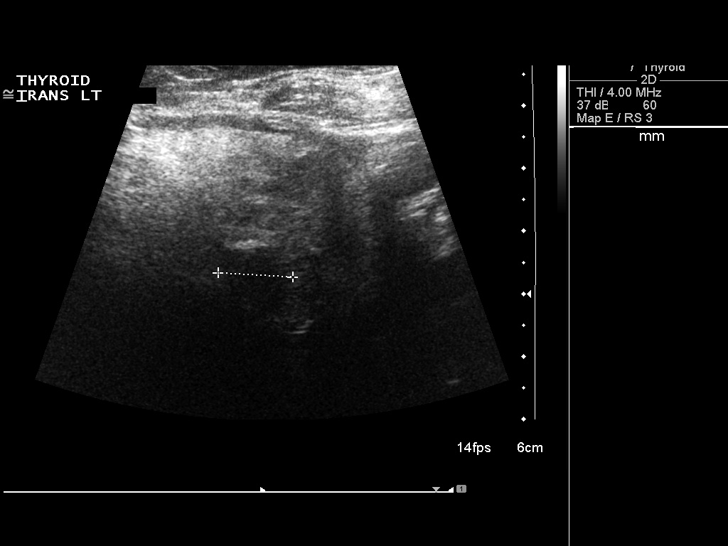
[im 57/57]
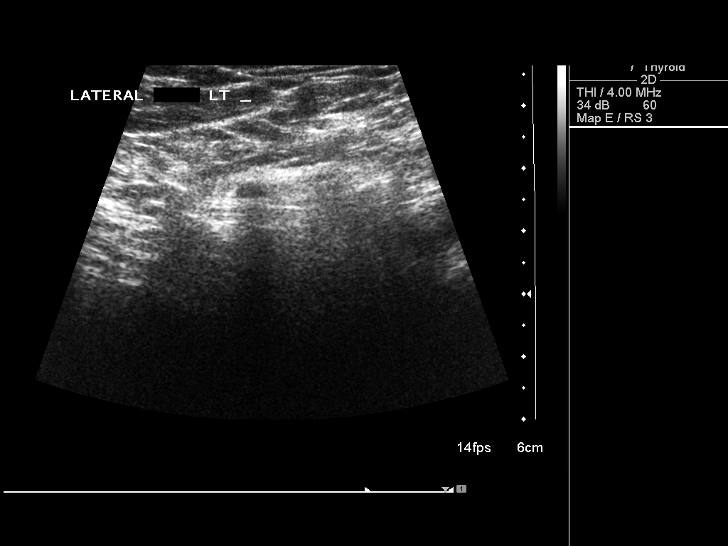

[14 of 25 positions shown; findings below may reference images not displayed]

FINDINGS: Right thyroid lobe

Measurements: 5.7 x 2.6 x 3.0 cm. Solid upper pole nodule measures
15 x 9 x 10 mm. Dominant mid lobe nodule measures 3.2 x 2.3 x
cm.

Left thyroid lobe

Measurements: 7.2 x 3.5 x 3.7 cm. Solid upper pole nodule measures
16 x 12 x 15 mm. Solid mid nodule measures 3.0 x 1.9 x 2.3 cm.
Hypoechoic lower pole nodule measures 1.9 x 1.1 x 1.2 cm.

Isthmus

Thickness: 7 mm.  No nodules visualized.

Lymphadenopathy

None visualized.
IMPRESSION: There are several nodules bilaterally. Fine-needle aspiration of the
bilateral dominant nodules are recommended. Findings meet consensus
criteria for biopsy. Ultrasound-guided fine needle aspiration should
be considered, as per the consensus statement: Management of Thyroid
Nodules Detected at US: Society of Radiologists in Ultrasound

## 2015-12-28 ENCOUNTER — Other Ambulatory Visit: Payer: Self-pay | Admitting: Internal Medicine

## 2015-12-28 DIAGNOSIS — E041 Nontoxic single thyroid nodule: Secondary | ICD-10-CM

## 2015-12-28 DIAGNOSIS — L02414 Cutaneous abscess of left upper limb: Secondary | ICD-10-CM | POA: Diagnosis not present

## 2015-12-28 DIAGNOSIS — Z85828 Personal history of other malignant neoplasm of skin: Secondary | ICD-10-CM | POA: Diagnosis not present

## 2016-01-12 ENCOUNTER — Ambulatory Visit
Admission: RE | Admit: 2016-01-12 | Discharge: 2016-01-12 | Disposition: A | Discharge status: Home / Self Care | Payer: Medicare Other | Source: Ambulatory Visit | Attending: Internal Medicine | Admitting: Internal Medicine

## 2016-01-12 ENCOUNTER — Other Ambulatory Visit (HOSPITAL_COMMUNITY)
Admission: RE | Admit: 2016-01-12 | Discharge: 2016-01-12 | Disposition: A | Discharge status: Home / Self Care | Payer: Medicare Other | Source: Ambulatory Visit | Attending: Internal Medicine | Admitting: Internal Medicine

## 2016-01-12 ENCOUNTER — Other Ambulatory Visit: Payer: Medicare Other

## 2016-01-12 DIAGNOSIS — E041 Nontoxic single thyroid nodule: Secondary | ICD-10-CM

## 2016-01-12 DIAGNOSIS — E0789 Other specified disorders of thyroid: Secondary | ICD-10-CM | POA: Diagnosis not present

## 2016-01-12 IMAGING — US US THYROID BIOPSY
1 series · 13 of 13 positions shown · non-contrast
Comparison: Thyroid ultrasound - [DATE]

MEDICATIONS:
None

COMPLICATIONS:
None immediate.

INDICATION: Indeterminate thyroid nodule

EXAM:
ULTRASOUND GUIDED FINE NEEDLE ASPIRATION OF INDETERMINATE THYROID
NODULE
TECHNIQUE: Informed written consent was obtained from the patient after a
discussion of the risks, benefits and alternatives to treatment.
Questions regarding the procedure were encouraged and answered. A
timeout was performed prior to the initiation of the procedure.

[Series 1: us thyroid biopsy · 0.08mm/px · 13 acquisitions, 13 frames shown]
[im 1/13]
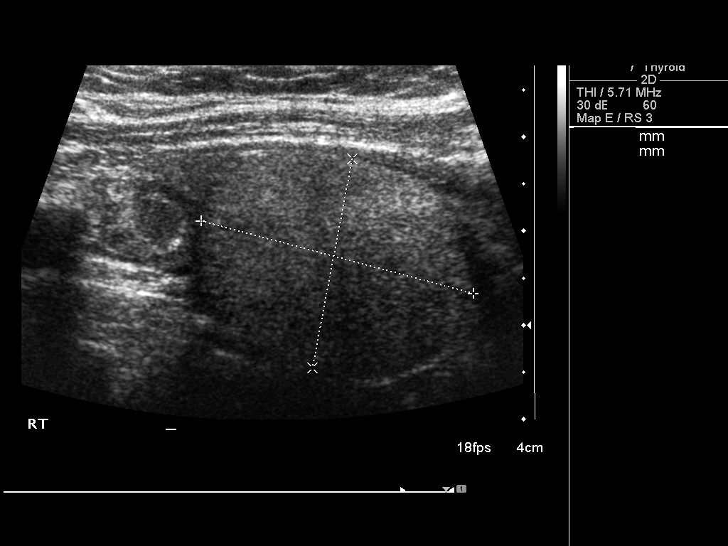
[im 2/13]
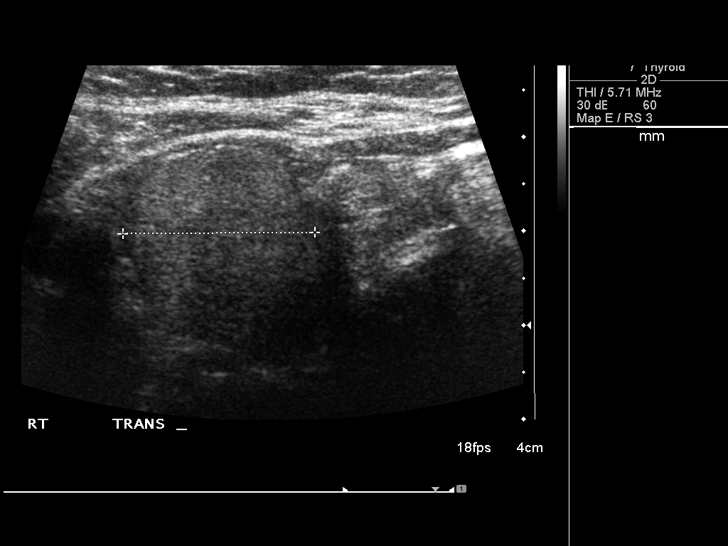
[im 3/13]
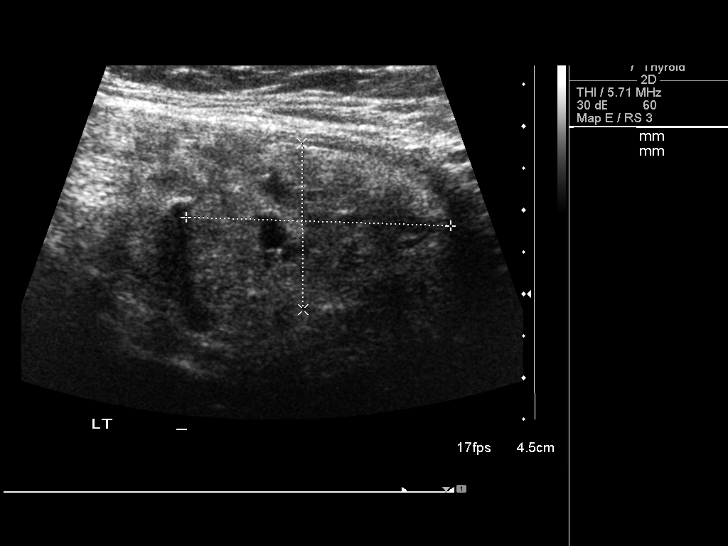
[im 4/13]
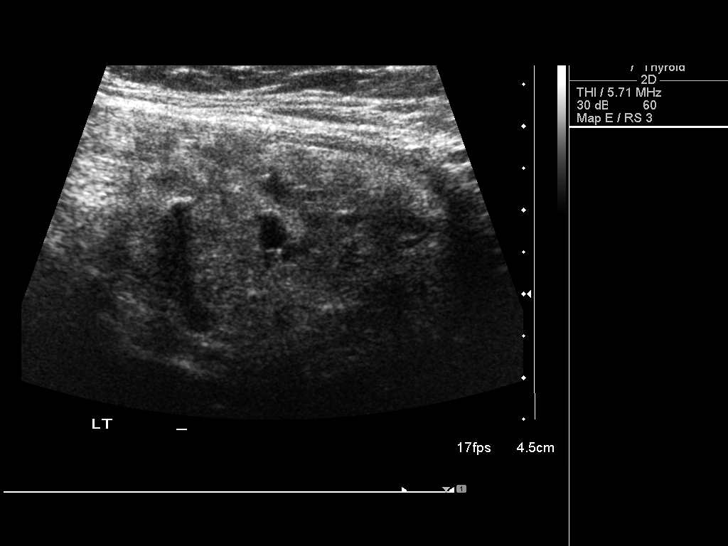
[im 5/13]
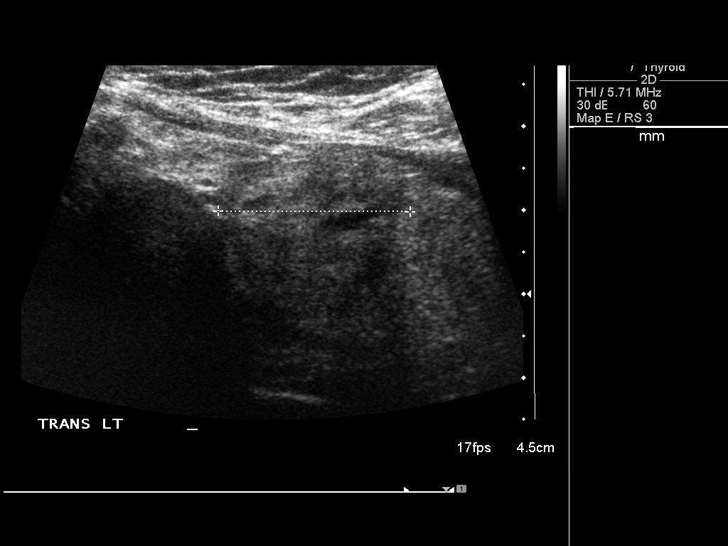
[im 6/13]
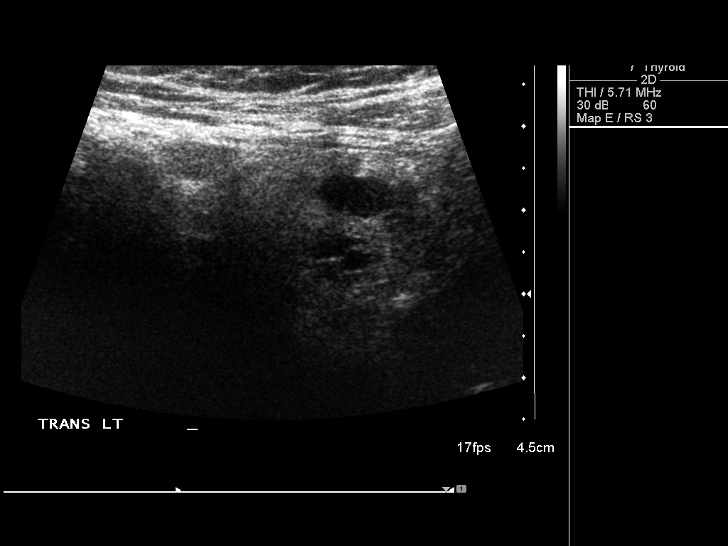
[im 7/13]
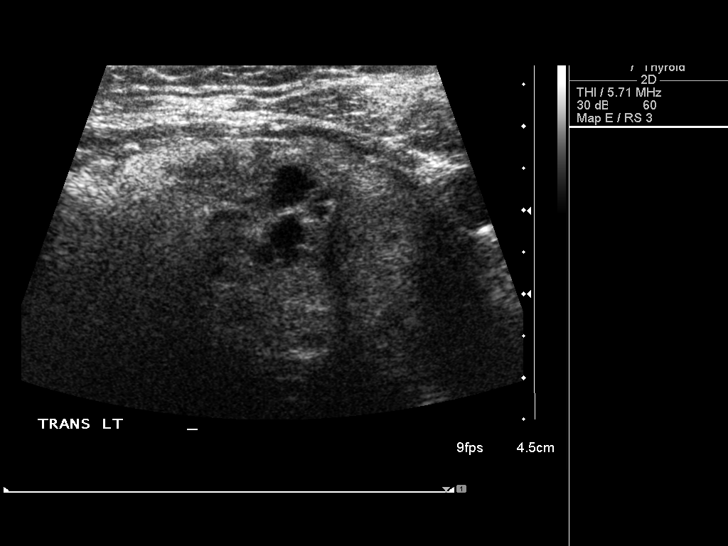
[im 8/13]
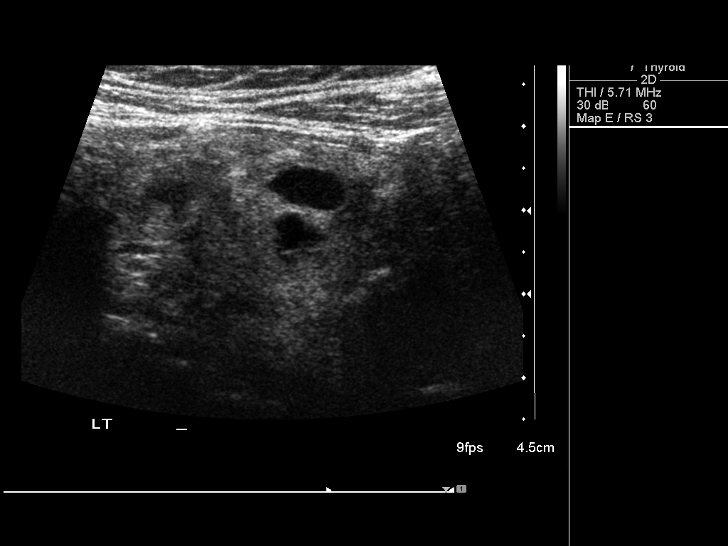
[im 9/13]
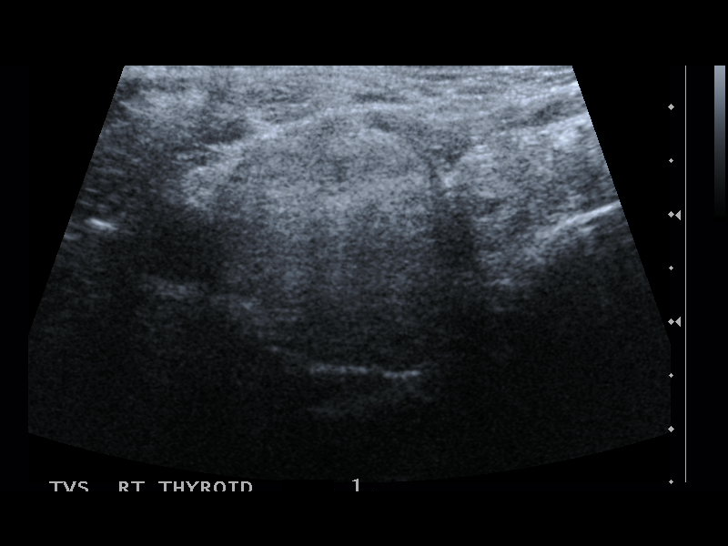
[im 10/13]
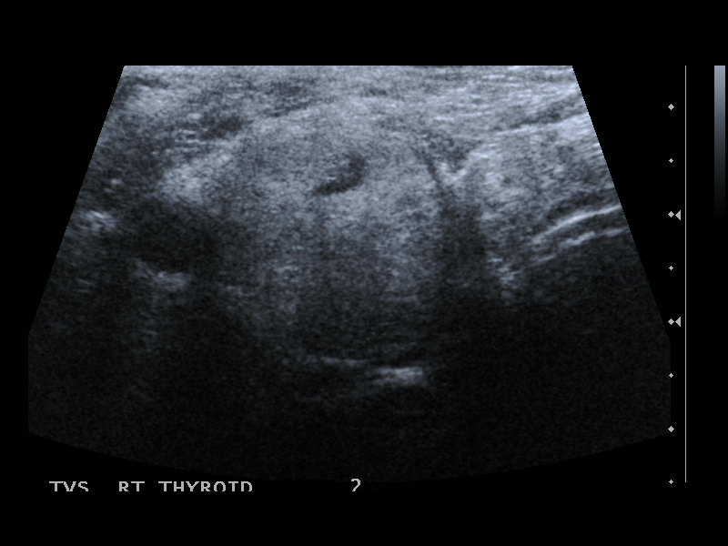
[im 11/13]
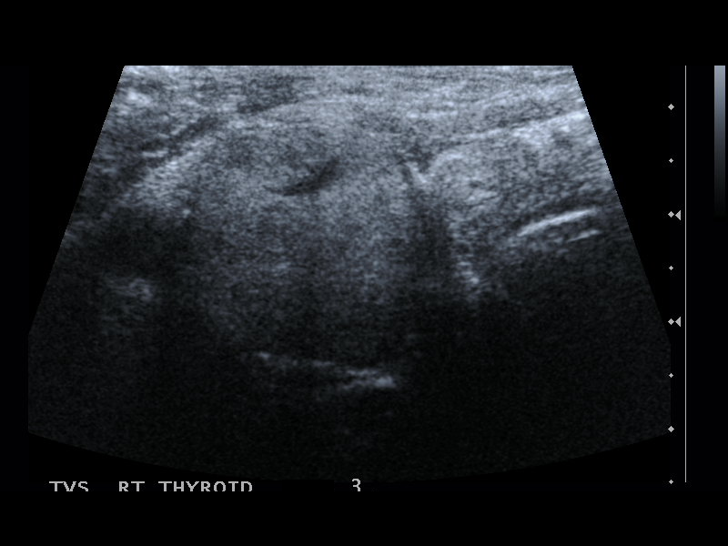
[im 12/13]
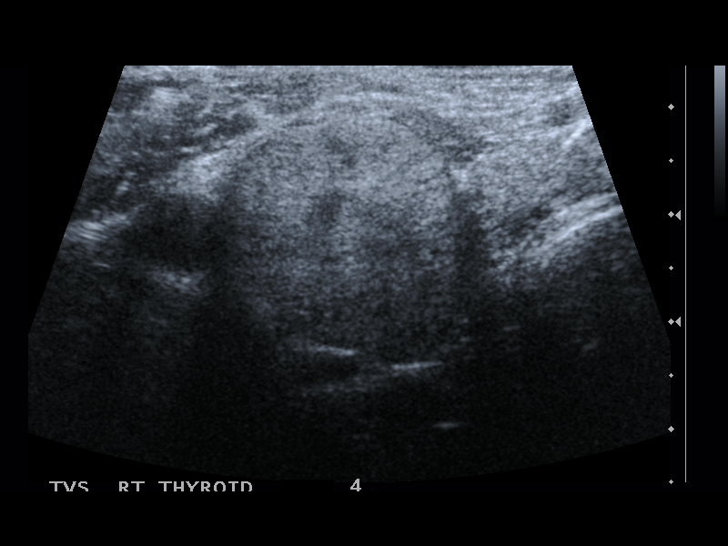
[im 13/13]
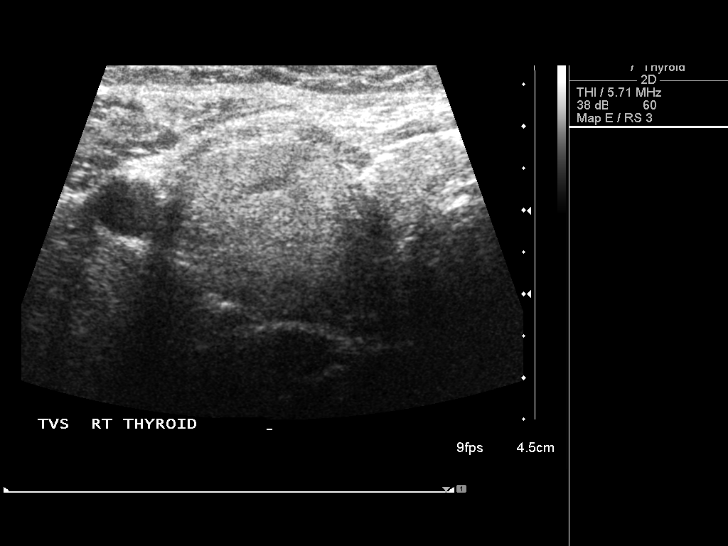

[13 of 13 positions shown; findings below may reference images not displayed]

Pre-procedural ultrasound scanning demonstrated unchanged size and
appearance of the dominant approximately 3.0 cm mixed echogenic
solid nodule/mass within the mid/inferior aspect the right lobe of
the thyroid.

Real-time scanning performed by the dictating interventional
radiologist failed to definitively replicate the previously measured
approximately 3.0 cm nodule within the inferior aspect the left lobe
of the thyroid. Rather, this nodule is favored to of represented a
pseudo nodule.

The procedure was planned. The neck was prepped in the usual sterile
fashion, and a sterile drape was applied covering the operative
field. A timeout was performed prior to the initiation of the
procedure. Local anesthesia was provided with 1% lidocaine.

Under direct ultrasound guidance, 4 FNA biopsies were performed of
the dominant approximately 3.0 cm nodule/mass within the inferior
aspect the right lobe of the thyroid with a 27 gauge needle. The
samples were prepared and submitted to pathology.

Limited post procedural scanning was negative for hematoma or
additional complication. Dressings were placed. The patient
tolerated the above procedures procedure well without immediate
postprocedural complication.
IMPRESSION: 1. Technically successful ultrasound guided fine needle aspiration
of dominant approximately 3 cm solid nodule/mass within the
mid/inferior aspect the right lobe of the thyroid.
2. Real-time scanning performed by the performing interventional
radiologist failed to definitively replicate the previously measured
dominant approximately 3 cm nodule/mass within the inferior aspect
the left lobe of the thyroid as such, this apparent nodule is
favored to represent a pseudo nodule and was not biopsied.

## 2016-01-16 DIAGNOSIS — J449 Chronic obstructive pulmonary disease, unspecified: Secondary | ICD-10-CM | POA: Diagnosis not present

## 2016-01-16 DIAGNOSIS — R918 Other nonspecific abnormal finding of lung field: Secondary | ICD-10-CM | POA: Diagnosis not present

## 2016-01-16 DIAGNOSIS — G4733 Obstructive sleep apnea (adult) (pediatric): Secondary | ICD-10-CM | POA: Diagnosis not present

## 2016-01-16 DIAGNOSIS — Z6832 Body mass index (BMI) 32.0-32.9, adult: Secondary | ICD-10-CM | POA: Diagnosis not present

## 2016-01-18 DIAGNOSIS — L0101 Non-bullous impetigo: Secondary | ICD-10-CM | POA: Diagnosis not present

## 2016-01-18 DIAGNOSIS — L281 Prurigo nodularis: Secondary | ICD-10-CM | POA: Diagnosis not present

## 2016-01-18 DIAGNOSIS — Z85828 Personal history of other malignant neoplasm of skin: Secondary | ICD-10-CM | POA: Diagnosis not present

## 2016-01-18 DIAGNOSIS — L0109 Other impetigo: Secondary | ICD-10-CM | POA: Diagnosis not present

## 2016-02-01 ENCOUNTER — Ambulatory Visit (INDEPENDENT_AMBULATORY_CARE_PROVIDER_SITE_OTHER): Payer: Medicare Other | Admitting: Pulmonary Disease

## 2016-02-01 ENCOUNTER — Encounter: Payer: Self-pay | Admitting: Pulmonary Disease

## 2016-02-01 VITALS — BP 126/74 | HR 96 | Ht 63.5 in | Wt 181.4 lb

## 2016-02-01 DIAGNOSIS — R0600 Dyspnea, unspecified: Secondary | ICD-10-CM | POA: Diagnosis not present

## 2016-02-01 DIAGNOSIS — I350 Nonrheumatic aortic (valve) stenosis: Secondary | ICD-10-CM

## 2016-02-01 HISTORY — DX: Nonrheumatic aortic (valve) stenosis: I35.0
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
## 2016-02-01 NOTE — Patient Instructions (Addendum)
Shortness of breath 1. Continue your Anoro Ellipta. You may take your Ventolin inhaler 1-2 puffs every 4-6 hours as needed for shortness of breath. 2. We will get an echocardiogram to look at your heart and the blood vessels around that area. HOME CARE   Do not smoke.  Avoid being around chemicals or things (paint fumes, dust) that may bother your breathing.  Rest as needed. Slowly begin your normal activities.  Only take medicines as told by your doctor.  Keep all doctor visits as told. GET HELP RIGHT AWAY IF:   Your shortness of breath gets worse.  You feel lightheaded, pass out (faint), or have a cough that is not helped by medicine.  You cough up blood.  You have pain with breathing.  You have pain in your chest, arms, shoulders, or belly (abdomen).  You have a fever.  You cannot walk up stairs or exercise the way you normally do.  You do not get better in the time expected.  You have a hard time doing normal activities even with rest.  You have problems with your medicines.  You have any new symptoms.

## 2016-02-03 ENCOUNTER — Ambulatory Visit (HOSPITAL_COMMUNITY): Payer: Medicare Other | Attending: Cardiology

## 2016-02-03 ENCOUNTER — Other Ambulatory Visit: Payer: Self-pay

## 2016-02-03 DIAGNOSIS — I10 Essential (primary) hypertension: Secondary | ICD-10-CM | POA: Insufficient documentation

## 2016-02-03 DIAGNOSIS — I059 Rheumatic mitral valve disease, unspecified: Secondary | ICD-10-CM | POA: Diagnosis not present

## 2016-02-03 DIAGNOSIS — Z8249 Family history of ischemic heart disease and other diseases of the circulatory system: Secondary | ICD-10-CM | POA: Diagnosis not present

## 2016-02-03 DIAGNOSIS — Z87891 Personal history of nicotine dependence: Secondary | ICD-10-CM | POA: Insufficient documentation

## 2016-02-03 DIAGNOSIS — G4733 Obstructive sleep apnea (adult) (pediatric): Secondary | ICD-10-CM | POA: Diagnosis not present

## 2016-02-03 DIAGNOSIS — E119 Type 2 diabetes mellitus without complications: Secondary | ICD-10-CM | POA: Diagnosis not present

## 2016-02-03 DIAGNOSIS — R0600 Dyspnea, unspecified: Secondary | ICD-10-CM

## 2016-02-03 DIAGNOSIS — I350 Nonrheumatic aortic (valve) stenosis: Secondary | ICD-10-CM | POA: Diagnosis not present

## 2016-02-03 DIAGNOSIS — E785 Hyperlipidemia, unspecified: Secondary | ICD-10-CM | POA: Insufficient documentation

## 2016-02-03 NOTE — Progress Notes (Signed)
Quick Note:  Called spoke with patient, advised of cxr results as stated by PM. Pt verbalized her understanding and denied any questions. ______

## 2016-02-06 DIAGNOSIS — D440 Neoplasm of uncertain behavior of thyroid gland: Secondary | ICD-10-CM | POA: Diagnosis not present

## 2016-02-06 DIAGNOSIS — E042 Nontoxic multinodular goiter: Secondary | ICD-10-CM | POA: Diagnosis not present

## 2016-02-09 ENCOUNTER — Other Ambulatory Visit: Payer: Self-pay | Admitting: Surgery

## 2016-02-09 DIAGNOSIS — E041 Nontoxic single thyroid nodule: Secondary | ICD-10-CM

## 2016-02-13 HISTORY — PX: TRANSTHORACIC ECHOCARDIOGRAM: SHX275

## 2016-02-15 ENCOUNTER — Other Ambulatory Visit (HOSPITAL_COMMUNITY)
Admission: RE | Admit: 2016-02-15 | Discharge: 2016-02-15 | Disposition: A | Discharge status: Home / Self Care | Payer: Medicare Other | Source: Ambulatory Visit | Attending: Physician Assistant | Admitting: Physician Assistant

## 2016-02-15 ENCOUNTER — Ambulatory Visit
Admission: RE | Admit: 2016-02-15 | Discharge: 2016-02-15 | Disposition: A | Discharge status: Home / Self Care | Payer: Medicare Other | Source: Ambulatory Visit | Attending: Surgery | Admitting: Surgery

## 2016-02-15 DIAGNOSIS — E041 Nontoxic single thyroid nodule: Secondary | ICD-10-CM | POA: Insufficient documentation

## 2016-02-15 IMAGING — US US THYROID BIOPSY
1 series · 13 of 18 positions shown · non-contrast
Comparison: Ultrasound done [DATE] thyroid biopsy done
[DATE].

INDICATION: of the right thyroid nodule measuring 3.2 x 2.3 x 2.5 cm. Pathology
report showed cytologic atypia and some Hurthle cell like change.
Follicular lesion/neoplasm could not be entirely ruled out. Request
for repeat ultrasound guided fine needle aspirate biopsy including
Afirma testing.

EXAM:
ULTRASOUND GUIDED NEEDLE ASPIRATE BIOPSY OF THE THYROID GLAND
MEDICATIONS:
1% Lidocaine.
ANESTHESIA/SEDATION:
No sedation medication given.

[Series 1: us thyroid biopsy · 0.08mm/px · 18 acquisitions, 13 frames shown]
[im 1/18]
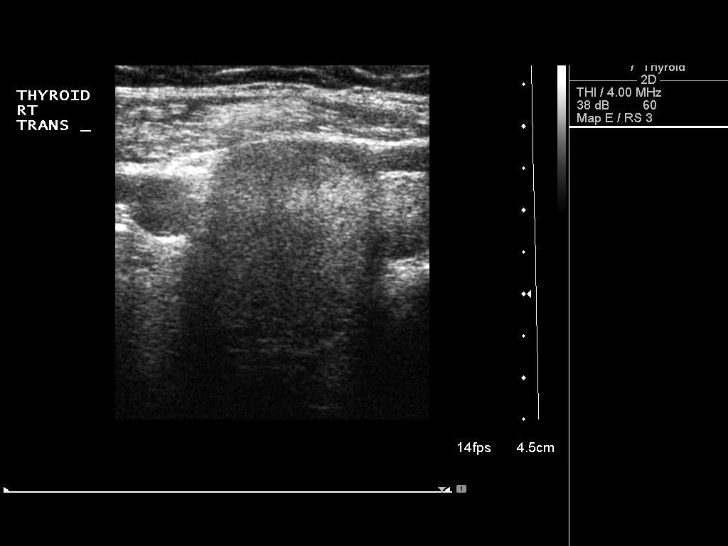
[im 3/18]
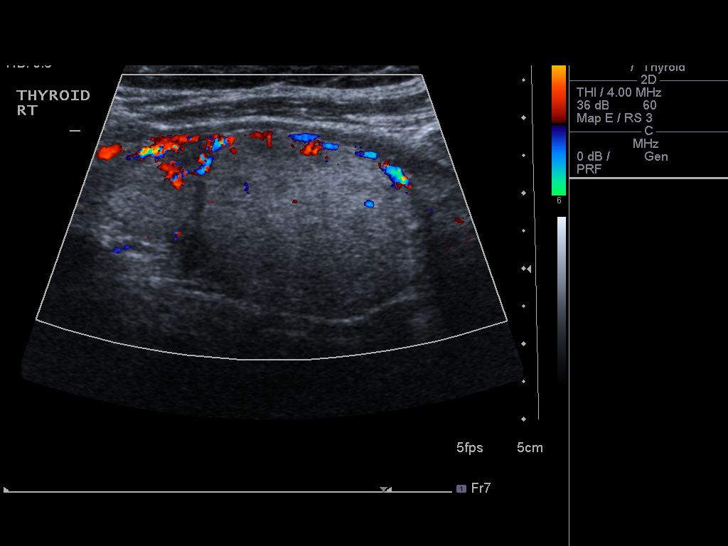
[im 4/18]
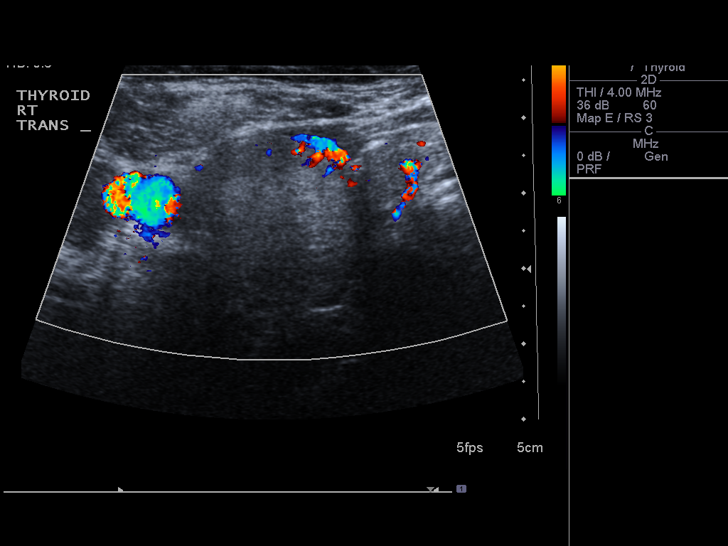
[im 5/18]
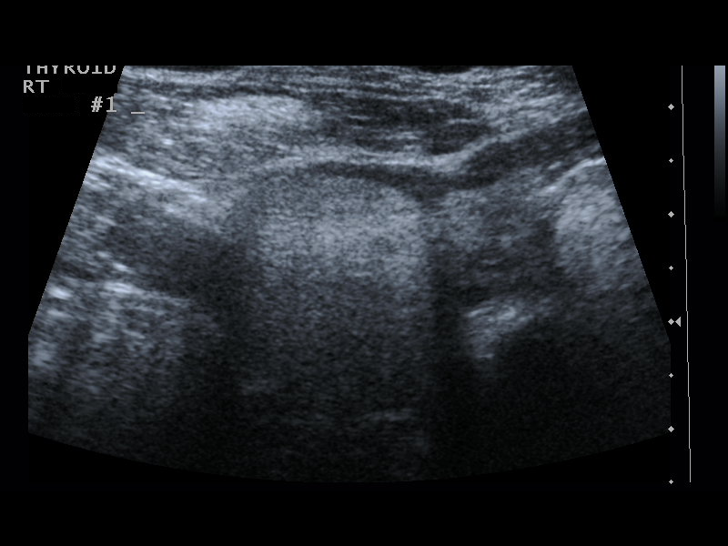
[im 7/18]
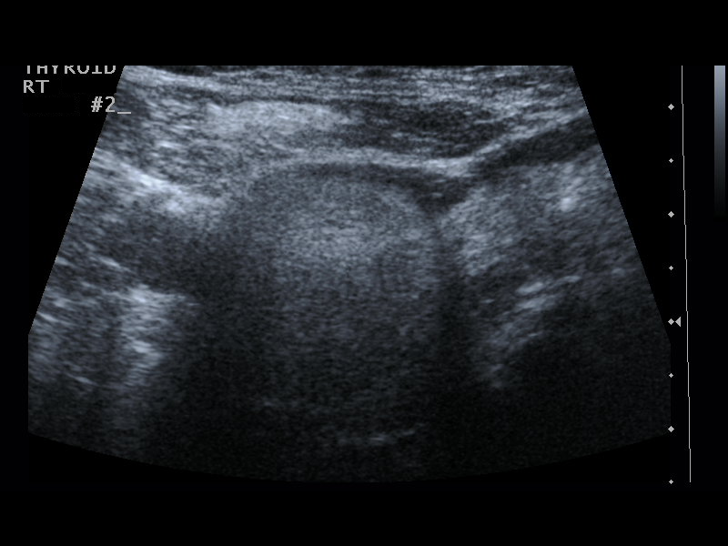
[im 8/18]
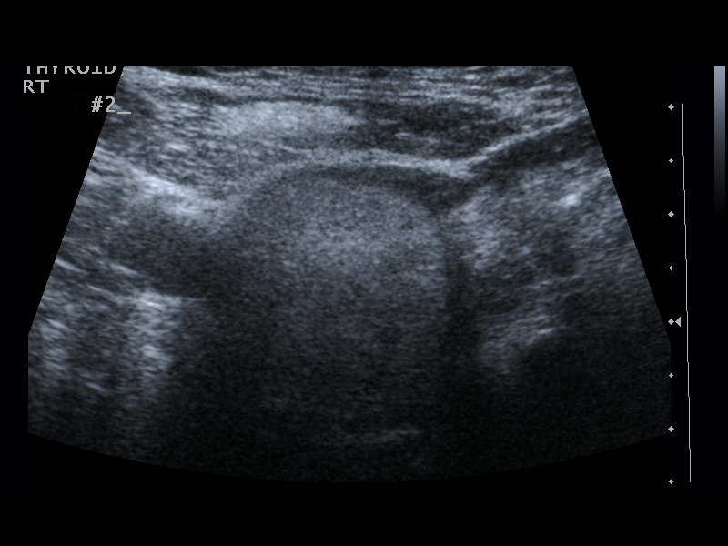
[im 10/18]
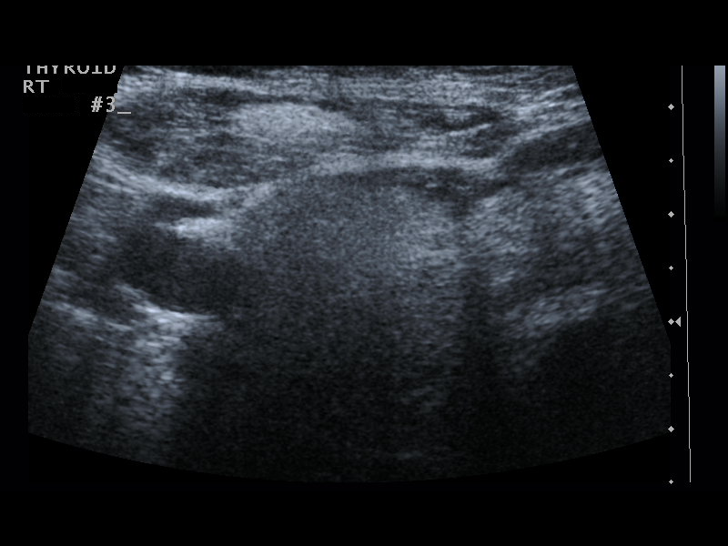
[im 11/18]
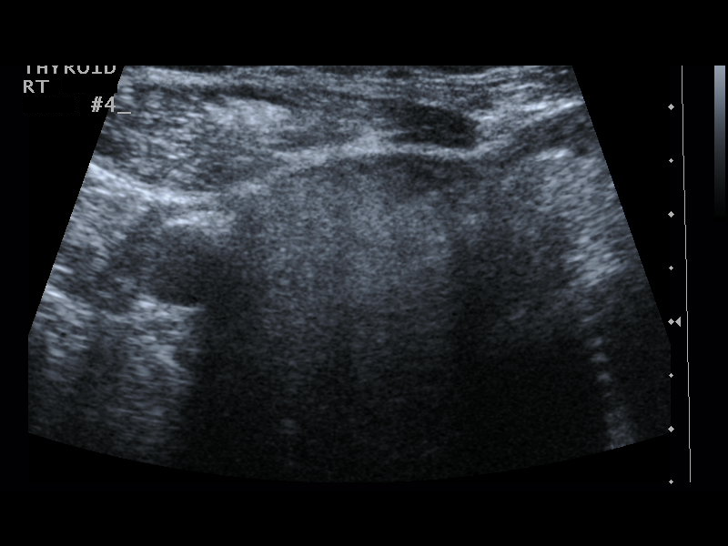
[im 12/18]
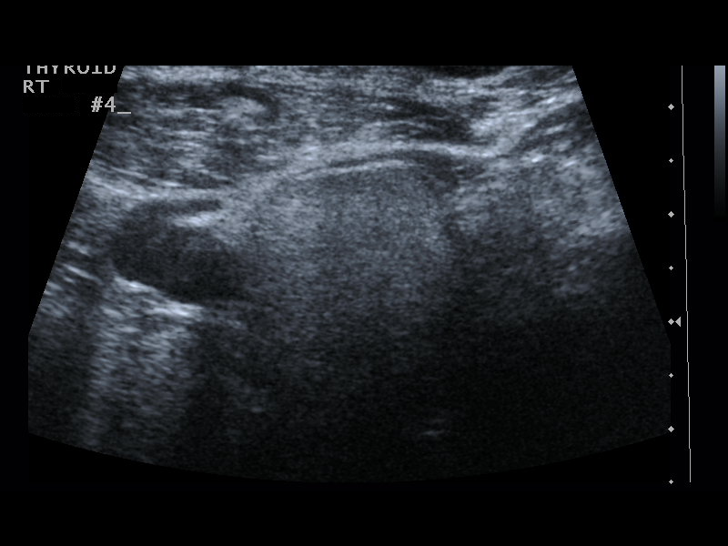
[im 14/18]
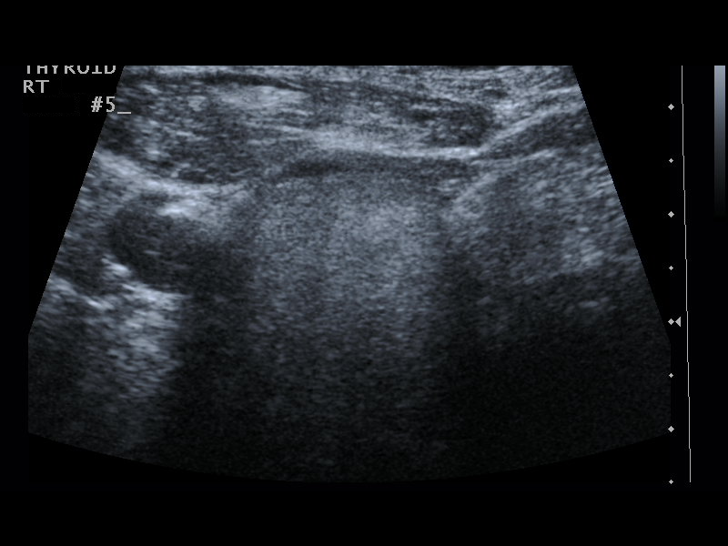
[im 15/18]
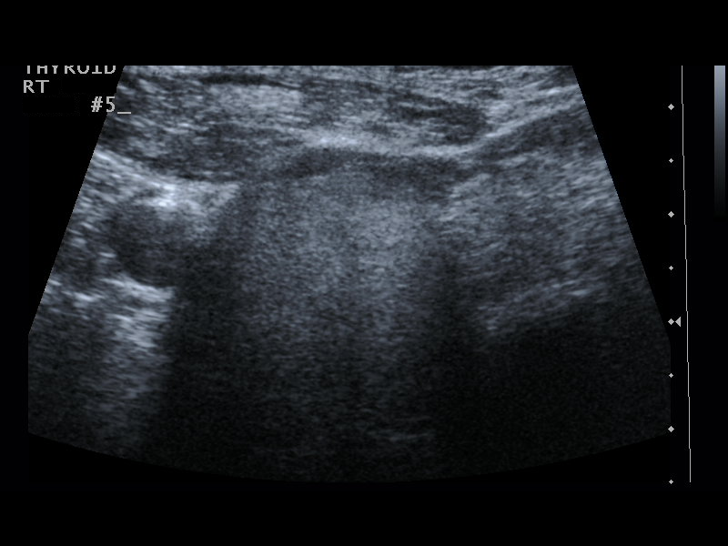
[im 16/18]
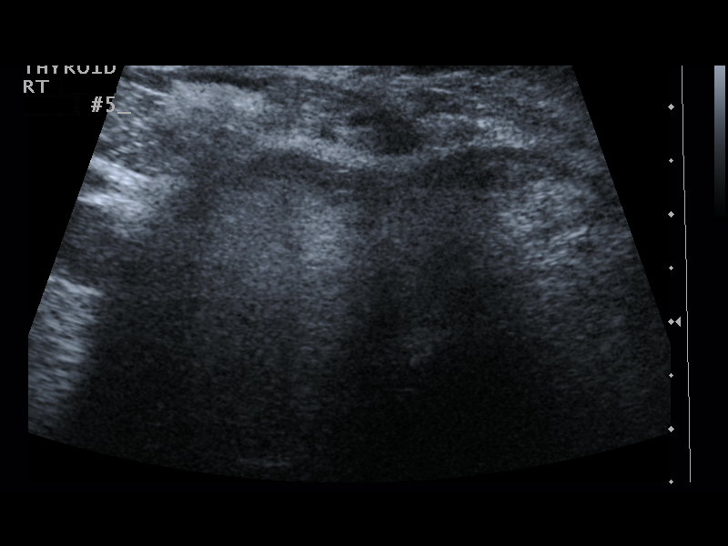
[im 18/18]
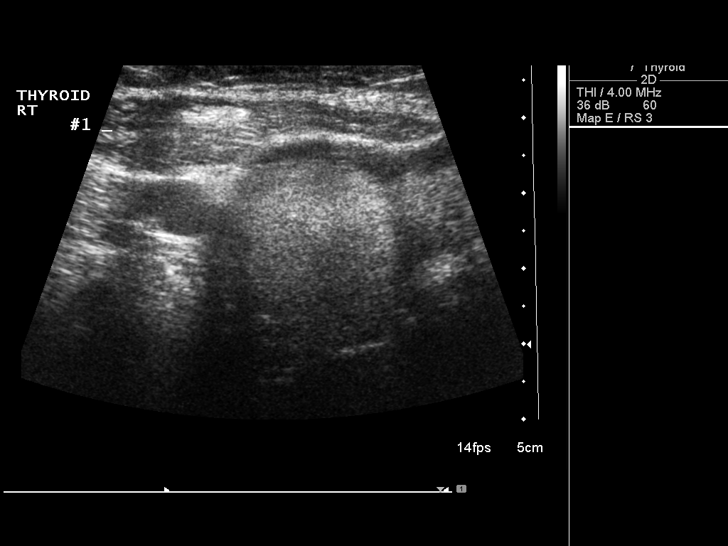

[13 of 18 positions shown; findings below may reference images not displayed]

COMPLICATIONS:
None immediate.

PROCEDURE:
Thyroid biopsy was thoroughly discussed with the patient and
questions were answered. The benefits, risks, alternatives, and
complications were also discussed. The patient understands and
wishes to proceed with the procedure. Written consent was obtained.

Ultrasound was performed to localize and mark an adequate site for
the biopsy. The patient was then prepped and draped in a normal
sterile fashion. Local anesthesia was provided with 1% lidocaine.
Using direct ultrasound guidance, 5 passes were made using needles
into the nodule within the right lobe of the thyroid. Ultrasound was
used to confirm needle placements on all occasions. Specimens were
sent to Pathology for analysis. Afirma testing done.
IMPRESSION: Ultrasound guided needle aspirate biopsy performed of the right
thyroid nodule.

## 2016-02-15 NOTE — Procedures (Signed)
Using direct ultrasound guidance, 5 passes were made using needles into the nodule within the right lobe of the thyroid.   Ultrasound was used to confirm needle placements on all occasions.   Specimens were sent to Pathology for analysis.   WENDY S BLAIR PA-C 02/15/2016 9:17 AM

## 2016-02-24 ENCOUNTER — Ambulatory Visit: Payer: Medicare Other | Admitting: Internal Medicine

## 2016-03-15 DIAGNOSIS — H2513 Age-related nuclear cataract, bilateral: Secondary | ICD-10-CM | POA: Diagnosis not present

## 2016-03-15 DIAGNOSIS — E119 Type 2 diabetes mellitus without complications: Secondary | ICD-10-CM | POA: Diagnosis not present

## 2016-03-15 DIAGNOSIS — H538 Other visual disturbances: Secondary | ICD-10-CM | POA: Diagnosis not present

## 2016-03-21 ENCOUNTER — Other Ambulatory Visit: Payer: Self-pay | Admitting: Acute Care

## 2016-03-21 DIAGNOSIS — Z87891 Personal history of nicotine dependence: Secondary | ICD-10-CM

## 2016-04-06 DIAGNOSIS — E784 Other hyperlipidemia: Secondary | ICD-10-CM | POA: Diagnosis not present

## 2016-04-06 DIAGNOSIS — E041 Nontoxic single thyroid nodule: Secondary | ICD-10-CM | POA: Diagnosis not present

## 2016-04-06 DIAGNOSIS — Z6832 Body mass index (BMI) 32.0-32.9, adult: Secondary | ICD-10-CM | POA: Diagnosis not present

## 2016-04-06 DIAGNOSIS — J449 Chronic obstructive pulmonary disease, unspecified: Secondary | ICD-10-CM | POA: Diagnosis not present

## 2016-04-06 DIAGNOSIS — D6489 Other specified anemias: Secondary | ICD-10-CM | POA: Diagnosis not present

## 2016-04-06 DIAGNOSIS — E1149 Type 2 diabetes mellitus with other diabetic neurological complication: Secondary | ICD-10-CM | POA: Diagnosis not present

## 2016-04-06 DIAGNOSIS — I10 Essential (primary) hypertension: Secondary | ICD-10-CM | POA: Diagnosis not present

## 2016-04-06 DIAGNOSIS — E1129 Type 2 diabetes mellitus with other diabetic kidney complication: Secondary | ICD-10-CM | POA: Diagnosis not present

## 2016-04-30 ENCOUNTER — Encounter: Payer: Self-pay | Admitting: Pulmonary Disease

## 2016-04-30 ENCOUNTER — Ambulatory Visit (INDEPENDENT_AMBULATORY_CARE_PROVIDER_SITE_OTHER): Payer: Medicare Other | Admitting: Pulmonary Disease

## 2016-04-30 VITALS — BP 132/84 | HR 82 | Ht 63.5 in | Wt 183.6 lb

## 2016-04-30 DIAGNOSIS — R0600 Dyspnea, unspecified: Secondary | ICD-10-CM

## 2016-04-30 DIAGNOSIS — G4733 Obstructive sleep apnea (adult) (pediatric): Secondary | ICD-10-CM
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
## 2016-04-30 NOTE — Patient Instructions (Signed)
Please continue using the Anoro Elipta as prescribed. Continue using the CPAP for OSA  We'll see you back in 6 months after her trip to [place].

## 2016-05-03 DIAGNOSIS — Z85828 Personal history of other malignant neoplasm of skin: Secondary | ICD-10-CM | POA: Diagnosis not present

## 2016-05-03 DIAGNOSIS — C44729 Squamous cell carcinoma of skin of left lower limb, including hip: Secondary | ICD-10-CM | POA: Diagnosis not present

## 2016-05-03 DIAGNOSIS — D485 Neoplasm of uncertain behavior of skin: Secondary | ICD-10-CM | POA: Diagnosis not present

## 2016-05-03 DIAGNOSIS — L0889 Other specified local infections of the skin and subcutaneous tissue: Secondary | ICD-10-CM | POA: Diagnosis not present

## 2016-05-03 DIAGNOSIS — L565 Disseminated superficial actinic porokeratosis (DSAP): Secondary | ICD-10-CM | POA: Diagnosis not present

## 2016-05-03 DIAGNOSIS — L570 Actinic keratosis: Secondary | ICD-10-CM | POA: Diagnosis not present

## 2016-06-27 ENCOUNTER — Other Ambulatory Visit: Payer: Self-pay | Admitting: Surgery

## 2016-06-27 DIAGNOSIS — E042 Nontoxic multinodular goiter: Secondary | ICD-10-CM

## 2016-07-04 ENCOUNTER — Ambulatory Visit
Admission: RE | Admit: 2016-07-04 | Discharge: 2016-07-04 | Disposition: A | Discharge status: Home / Self Care | Payer: Medicare Other | Source: Ambulatory Visit | Attending: Surgery | Admitting: Surgery

## 2016-07-04 DIAGNOSIS — E042 Nontoxic multinodular goiter: Secondary | ICD-10-CM | POA: Diagnosis not present

## 2016-07-04 IMAGING — US US THYROID
1 series · 13 of 25 positions shown · non-contrast
Comparison: [DATE]

CLINICAL DATA: Multi nodular goiter.

EXAM:
THYROID ULTRASOUND
TECHNIQUE: Ultrasound examination of the thyroid gland and adjacent soft
tissues was performed.

[Series 1: us thyroid · 0.09mm/px · 13 of 46 slices shown]
[im 1/46]
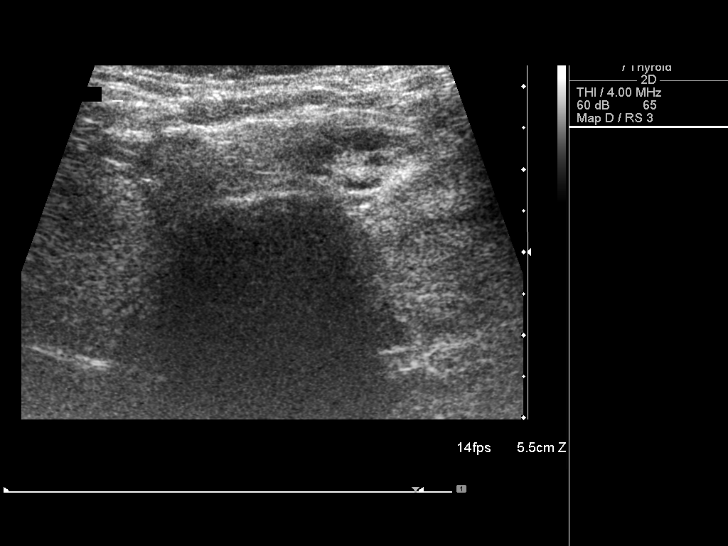
[im 4/46]
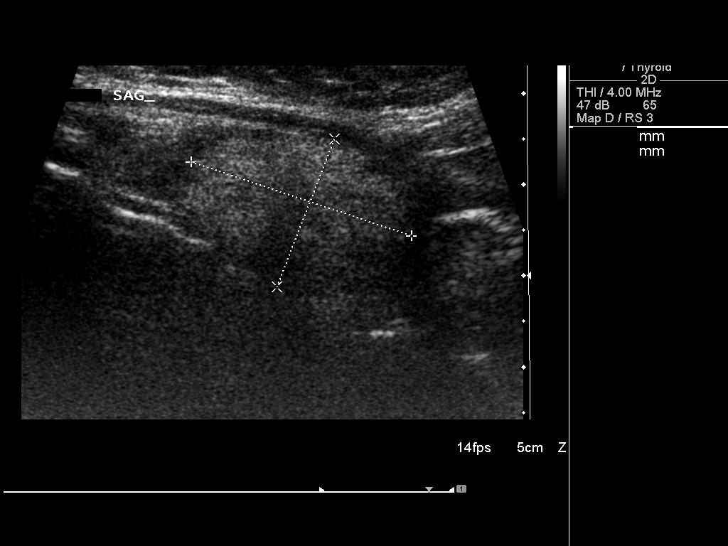
[im 8/46]
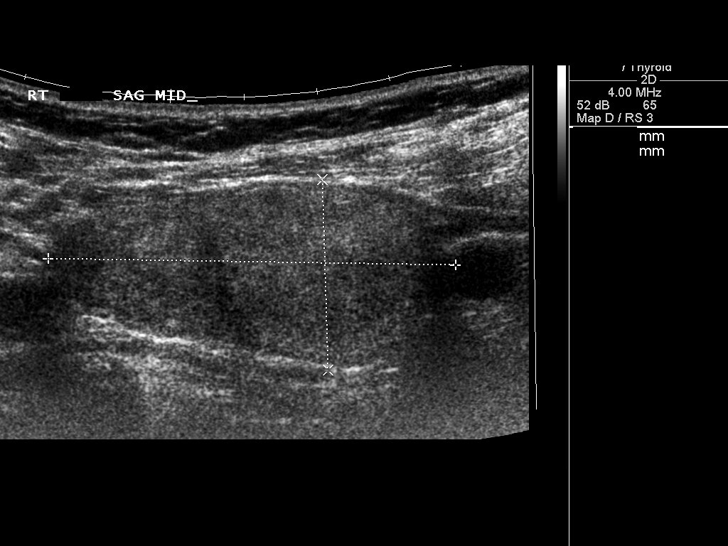
[im 12/46]
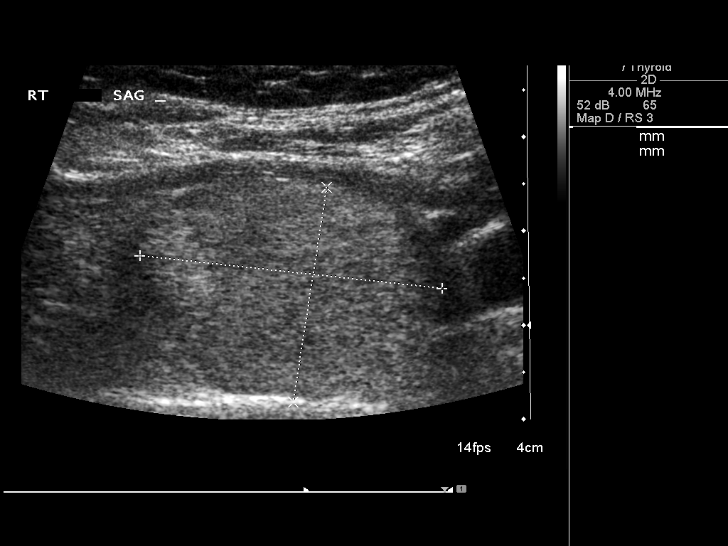
[im 16/46]
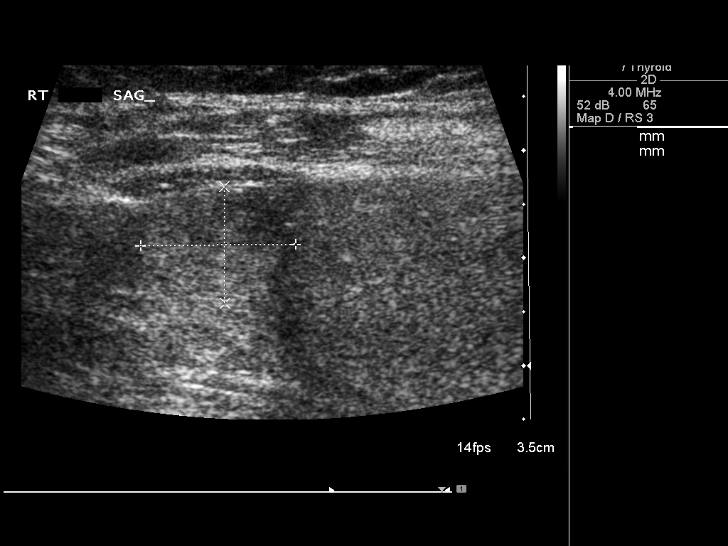
[im 19/46]
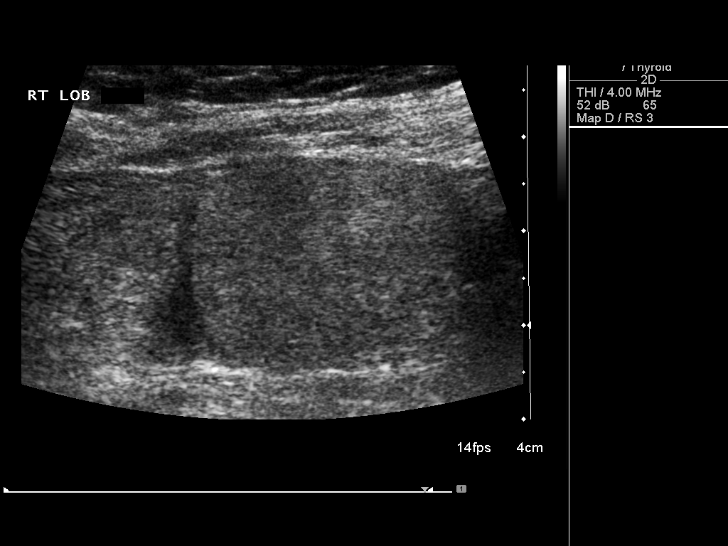
[im 23/46]
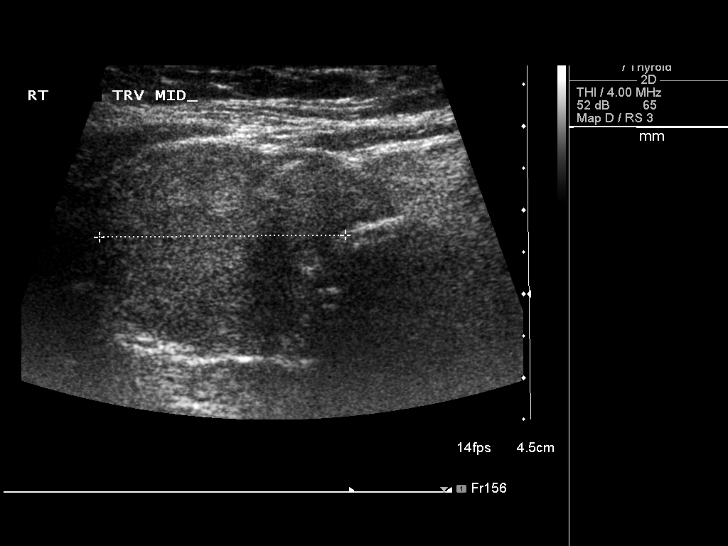
[im 27/46]
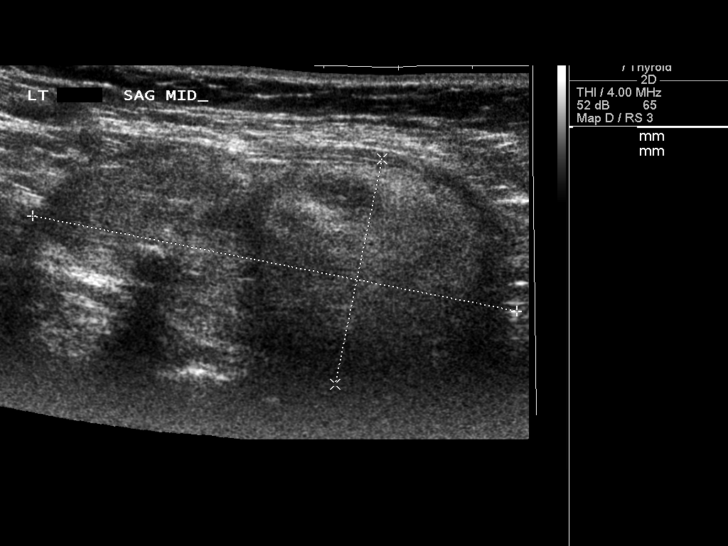
[im 31/46]
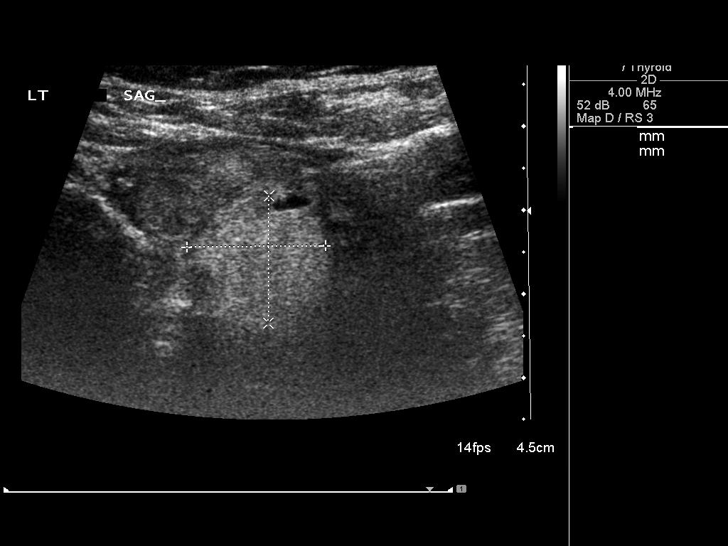
[im 34/46]
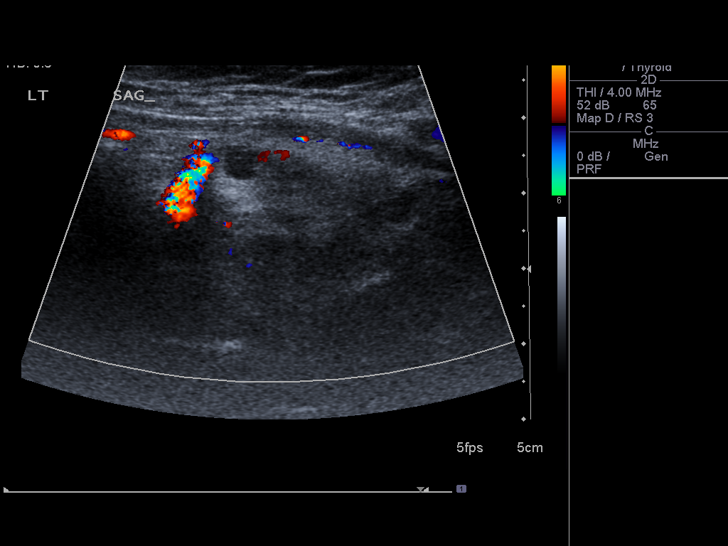
[im 38/46]
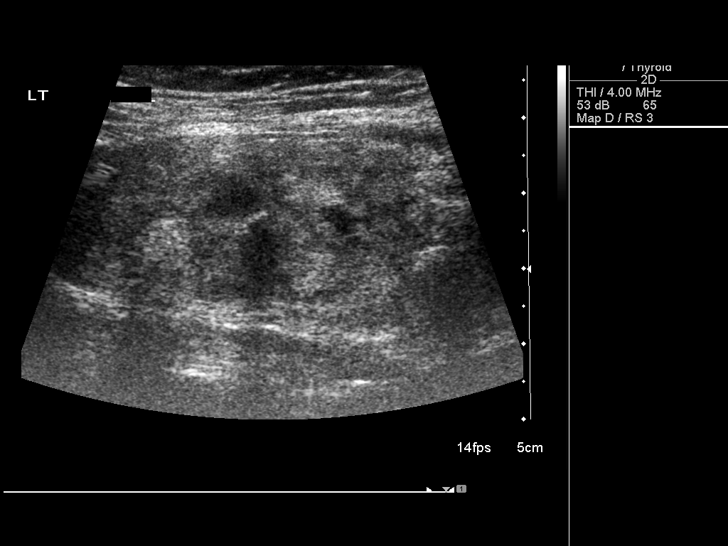
[im 42/46]
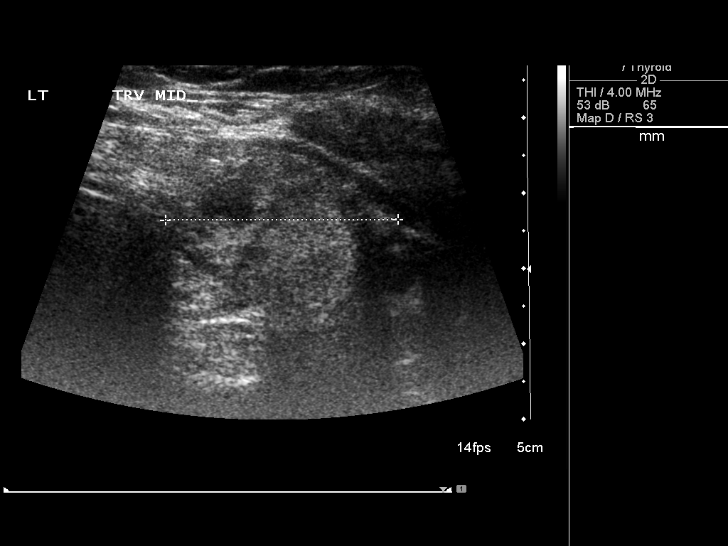
[im 46/46]
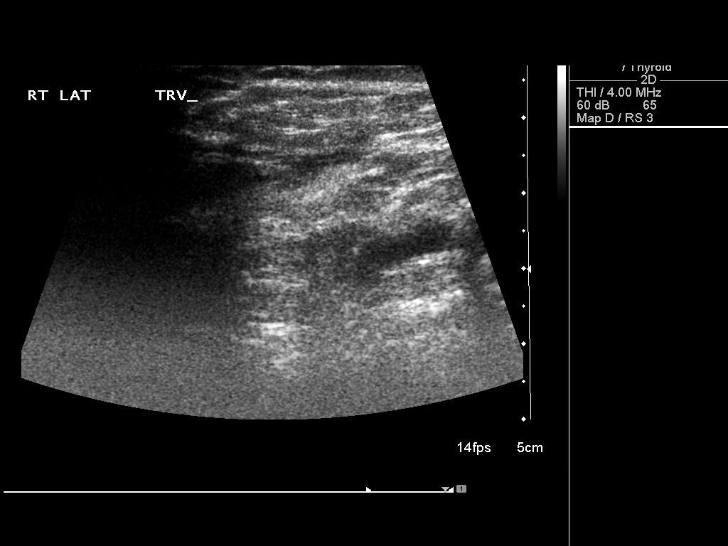

[13 of 25 positions shown; findings below may reference images not displayed]

FINDINGS: Right thyroid lobe

Measurements: 5.6 x 2.6 x 2.9 cm. Solid and mildly heterogeneous
nodule in the mid right thyroid lobe measures 3.2 x 2.3 x 2.4 cm,
previously measured 3.2 x 2.3 x 2.5 cm. Solid nodule in the superior
right thyroid lobe measures 1.4 x 1.1 x 1.2 cm and previously
measured 1.5 x 0.9 x 1.0 cm.

Left thyroid lobe

Measurements: 6.6 x 3.1 x 3.1 cm. Poorly defined hyperechoic nodule
along the superior left thyroid lobe measures 1.7 x 1.5 x 1.3 cm
previously measured 1.6 x 1.2 x 1.5 cm. Dominant heterogeneous
nodule/pseudo nodule in left thyroid lobe measures 3.4 x 2.8 x
cm and previously measured 3.0 x 1.9 x 2.3 cm. This was felt to
represent a pseudo nodule during the biopsy on [DATE]. Left
thyroid lobe is diffusely heterogeneous.

Isthmus

Thickness: 1.0 cm. Mildly heterogeneous solid nodule in the isthmus
that measures 2.6 x 2.6 x 1.7 cm and this was not seen on the prior
examination.

Lymphadenopathy

None visualized.
IMPRESSION: Multi nodular goiter. There is a solid isthmus nodule which was not
seen on the previous examination. This nodule does meet criteria for
ultrasound-guided biopsy.

No change in the dominant right thyroid nodule.

Heterogeneous nodule versus pseudo nodule in the left thyroid lobe
has slightly enlarged. Recommend continued surveillance of this
area.

## 2016-08-16 DIAGNOSIS — I10 Essential (primary) hypertension: Secondary | ICD-10-CM | POA: Diagnosis not present

## 2016-08-16 DIAGNOSIS — N183 Chronic kidney disease, stage 3 (moderate): Secondary | ICD-10-CM | POA: Diagnosis not present

## 2016-08-16 DIAGNOSIS — E1129 Type 2 diabetes mellitus with other diabetic kidney complication: Secondary | ICD-10-CM | POA: Diagnosis not present

## 2016-08-16 DIAGNOSIS — Z23 Encounter for immunization: Secondary | ICD-10-CM | POA: Diagnosis not present

## 2016-08-16 DIAGNOSIS — E1149 Type 2 diabetes mellitus with other diabetic neurological complication: Secondary | ICD-10-CM | POA: Diagnosis not present

## 2016-08-16 DIAGNOSIS — Z6831 Body mass index (BMI) 31.0-31.9, adult: Secondary | ICD-10-CM | POA: Diagnosis not present

## 2016-08-16 DIAGNOSIS — Z1389 Encounter for screening for other disorder: Secondary | ICD-10-CM | POA: Diagnosis not present

## 2016-08-16 DIAGNOSIS — J449 Chronic obstructive pulmonary disease, unspecified: Secondary | ICD-10-CM | POA: Diagnosis not present

## 2016-08-16 DIAGNOSIS — D6489 Other specified anemias: Secondary | ICD-10-CM | POA: Diagnosis not present

## 2016-08-16 DIAGNOSIS — E784 Other hyperlipidemia: Secondary | ICD-10-CM | POA: Diagnosis not present

## 2016-09-07 DIAGNOSIS — E042 Nontoxic multinodular goiter: Secondary | ICD-10-CM | POA: Diagnosis not present

## 2016-09-11 DIAGNOSIS — L03116 Cellulitis of left lower limb: Secondary | ICD-10-CM | POA: Diagnosis not present

## 2016-09-11 DIAGNOSIS — L0889 Other specified local infections of the skin and subcutaneous tissue: Secondary | ICD-10-CM | POA: Diagnosis not present

## 2016-09-11 DIAGNOSIS — Z85828 Personal history of other malignant neoplasm of skin: Secondary | ICD-10-CM | POA: Diagnosis not present

## 2016-09-11 DIAGNOSIS — L309 Dermatitis, unspecified: Secondary | ICD-10-CM | POA: Diagnosis not present

## 2016-09-26 DIAGNOSIS — L281 Prurigo nodularis: Secondary | ICD-10-CM | POA: Diagnosis not present

## 2016-09-26 DIAGNOSIS — Z85828 Personal history of other malignant neoplasm of skin: Secondary | ICD-10-CM | POA: Diagnosis not present

## 2016-09-26 DIAGNOSIS — L280 Lichen simplex chronicus: Secondary | ICD-10-CM | POA: Diagnosis not present

## 2016-09-27 ENCOUNTER — Encounter: Payer: Self-pay | Admitting: Internal Medicine

## 2016-09-27 ENCOUNTER — Ambulatory Visit (INDEPENDENT_AMBULATORY_CARE_PROVIDER_SITE_OTHER): Payer: Medicare Other | Admitting: Internal Medicine

## 2016-09-27 DIAGNOSIS — J3089 Other allergic rhinitis: Secondary | ICD-10-CM

## 2016-09-27 DIAGNOSIS — G4733 Obstructive sleep apnea (adult) (pediatric): Secondary | ICD-10-CM | POA: Diagnosis not present

## 2016-09-27 DIAGNOSIS — J302 Other seasonal allergic rhinitis: Secondary | ICD-10-CM

## 2016-09-27 DIAGNOSIS — J309 Allergic rhinitis, unspecified: Secondary | ICD-10-CM | POA: Diagnosis not present

## 2016-09-27 NOTE — Patient Instructions (Signed)
We can continue CPAP 8/ Advanced, mask of choice, humidifier, supplies, AiView   Dx OSA  Please call as needed

## 2016-09-27 NOTE — Progress Notes (Signed)
Patient ID: [name] [name], female    DOB: 26-Nov-1941, 75 y.o.   MRN: [id number]  HPI oF former smoker- just quit in May. Followed for allergic rhinitis, hypersomnia w/ sleep apnea, complicated by DM, HBP, GERD   02/22/13- 73 yoF former smoker- just quit in May, 2012. Followed for allergic rhinitis, hypersomnia w/ sleep apnea, complicated by DM, HBP, GERD FOLLOWS FOR: Wearing CPAP 9/ Advanced every night 5-8 hours. States pressure  is good. Complains of dry mouth despite Biotene, humidifier..   02/22/15- 74 yoF former smoker- just quit in May, 2012. Followed for allergic rhinitis, hypersomnia w/ sleep apnea, complicated by DM, HBP, GERD FOLLOWS FOR: wears CPAP 8/ Advanced nightly 5-8 hrs DME [facility]. She says she has dry mouth. She is not having any other complaints. We had reduce pressure to 8 at last visit hoping to improve dry mouth complaint with no difference noted. She continues  Biotene. Not currently using her room humidifier and keeps bedroom cool.  09/27/2016-75 year old female former smoker followed for allergic rhinitis, OSA, complicated by DM, HBP, GERD CPAP 8/Advanced FOLLOWS FOR:DME: [facility]. Pt states she wears CPAP every night and DL attached.  Download-93% 4 hour compliance with excellent control, AHI 1.1/hour She sleeps very well with CPAP and feels well rested but still takes a 15 minute nap most afternoons. Pressure is comfortable. Had flu shot.  Review of Systems- see HPI Constitutional:   No-   weight loss, night sweats, fevers, chills, fatigue, lassitude. HEENT:   No-   headaches, difficulty swallowing, tooth/dental problems, sore throat,                  No-   sneezing, itching, ear ache, no-nasal congestion, post nasal drip,  CV:  No-   chest pain, orthopnea, PND, swelling in lower extremities, anasarca, dizziness, palpitations GI:  No-   heartburn, indigestion, abdominal pain, nausea, vomiting,  Resp:   No-  excess mucus,             No-   productive cough,  No  non-productive cough,  No-  coughing up of blood.              No-   change in color of mucus.  No- wheezing.   Skin: No-   rash or lesions. GU:  MS:  No-   joint pain or swelling.  Psych:  No- change in mood or affect. No depression or anxiety.  No memory loss.   Objective:   Physical Exam General- Alert, Oriented, Affect-appropriate, Distress- none acute, +overweight Skin- rash-none, lesions- none, excoriation- none Lymphadenopathy- none Head- atraumatic            Eyes- Gross vision intact, PERRLA, conjunctivae clear secretions            Ears- Hearing, canals-normal            Nose- Clear, no-Septal dev, mucus, polyps, erosion, perforation             Throat- Mallampati III-IV , mucosa-not unusually dry , drainage- none,  tonsils- atrophic Neck- flexible , trachea midline, no stridor , thyroid nl, carotid no bruit Chest - symmetrical excursion , unlabored           Heart/CV- RRR , no murmur , no gallop  , no rub, nl s1 s2                           - JVD- none , edema- none,  stasis changes- none, varices- none           Lung- + clear, unlabored, no-wheezing, dullness-none, rub- none           Chest wall-  Abd-  Br/ Gen/ Rectal- Not done, not indicated Extrem- cyanosis- none, clubbing, none, atrophy- none, strength- nl Neuro- grossly intact to observation

## 2016-09-28 NOTE — Assessment & Plan Note (Signed)
Good compliance and control documented. She is very pleased with CPAP and has no trouble using it all night every night. Quality of life definitely improved. She'll continue CPAP 8/Advanced

## 2016-09-28 NOTE — Assessment & Plan Note (Signed)
Occasional OTC antihistamine has been sufficient this year. Nothing more needed for now.

## 2016-10-08 ENCOUNTER — Encounter: Payer: Self-pay | Admitting: Internal Medicine

## 2016-10-24 DIAGNOSIS — C44729 Squamous cell carcinoma of skin of left lower limb, including hip: Secondary | ICD-10-CM | POA: Diagnosis not present

## 2016-10-24 DIAGNOSIS — Z85828 Personal history of other malignant neoplasm of skin: Secondary | ICD-10-CM | POA: Diagnosis not present

## 2016-10-24 DIAGNOSIS — C44622 Squamous cell carcinoma of skin of right upper limb, including shoulder: Secondary | ICD-10-CM | POA: Diagnosis not present

## 2016-10-24 DIAGNOSIS — D485 Neoplasm of uncertain behavior of skin: Secondary | ICD-10-CM | POA: Diagnosis not present

## 2016-10-24 DIAGNOSIS — L570 Actinic keratosis: Secondary | ICD-10-CM | POA: Diagnosis not present

## 2016-12-04 DIAGNOSIS — E041 Nontoxic single thyroid nodule: Secondary | ICD-10-CM | POA: Diagnosis not present

## 2016-12-04 DIAGNOSIS — E1149 Type 2 diabetes mellitus with other diabetic neurological complication: Secondary | ICD-10-CM | POA: Diagnosis not present

## 2016-12-04 DIAGNOSIS — I10 Essential (primary) hypertension: Secondary | ICD-10-CM | POA: Diagnosis not present

## 2016-12-04 DIAGNOSIS — E559 Vitamin D deficiency, unspecified: Secondary | ICD-10-CM | POA: Diagnosis not present

## 2016-12-04 DIAGNOSIS — E784 Other hyperlipidemia: Secondary | ICD-10-CM | POA: Diagnosis not present

## 2016-12-05 DIAGNOSIS — L280 Lichen simplex chronicus: Secondary | ICD-10-CM | POA: Diagnosis not present

## 2016-12-05 DIAGNOSIS — Z85828 Personal history of other malignant neoplasm of skin: Secondary | ICD-10-CM | POA: Diagnosis not present

## 2016-12-05 DIAGNOSIS — C44629 Squamous cell carcinoma of skin of left upper limb, including shoulder: Secondary | ICD-10-CM | POA: Diagnosis not present

## 2016-12-05 DIAGNOSIS — D485 Neoplasm of uncertain behavior of skin: Secondary | ICD-10-CM | POA: Diagnosis not present

## 2016-12-11 DIAGNOSIS — D6489 Other specified anemias: Secondary | ICD-10-CM | POA: Diagnosis not present

## 2016-12-11 DIAGNOSIS — Z1389 Encounter for screening for other disorder: Secondary | ICD-10-CM | POA: Diagnosis not present

## 2016-12-11 DIAGNOSIS — R0789 Other chest pain: Secondary | ICD-10-CM | POA: Diagnosis not present

## 2016-12-11 DIAGNOSIS — D72829 Elevated white blood cell count, unspecified: Secondary | ICD-10-CM | POA: Diagnosis not present

## 2016-12-11 DIAGNOSIS — E1129 Type 2 diabetes mellitus with other diabetic kidney complication: Secondary | ICD-10-CM | POA: Diagnosis not present

## 2016-12-11 DIAGNOSIS — R8299 Other abnormal findings in urine: Secondary | ICD-10-CM | POA: Diagnosis not present

## 2016-12-11 DIAGNOSIS — I10 Essential (primary) hypertension: Secondary | ICD-10-CM | POA: Diagnosis not present

## 2016-12-11 DIAGNOSIS — N183 Chronic kidney disease, stage 3 (moderate): Secondary | ICD-10-CM | POA: Diagnosis not present

## 2016-12-11 DIAGNOSIS — E041 Nontoxic single thyroid nodule: Secondary | ICD-10-CM | POA: Diagnosis not present

## 2016-12-11 DIAGNOSIS — Z0000 Encounter for general adult medical examination without abnormal findings: Secondary | ICD-10-CM | POA: Diagnosis not present

## 2016-12-11 DIAGNOSIS — D649 Anemia, unspecified: Secondary | ICD-10-CM | POA: Diagnosis not present

## 2016-12-11 DIAGNOSIS — Z6832 Body mass index (BMI) 32.0-32.9, adult: Secondary | ICD-10-CM | POA: Diagnosis not present

## 2016-12-11 DIAGNOSIS — Z23 Encounter for immunization: Secondary | ICD-10-CM | POA: Diagnosis not present

## 2016-12-14 ENCOUNTER — Other Ambulatory Visit: Payer: Self-pay | Admitting: Internal Medicine

## 2016-12-14 DIAGNOSIS — F17208 Nicotine dependence, unspecified, with other nicotine-induced disorders: Secondary | ICD-10-CM

## 2016-12-17 ENCOUNTER — Telehealth: Payer: Self-pay | Admitting: Hematology

## 2016-12-17 ENCOUNTER — Encounter: Payer: Self-pay | Admitting: Hematology

## 2016-12-17 DIAGNOSIS — Z1212 Encounter for screening for malignant neoplasm of rectum: Secondary | ICD-10-CM | POA: Diagnosis not present

## 2016-12-17 NOTE — Telephone Encounter (Signed)
Pt confirmed appt, verified demo and insurance, mailed pt letter, and lt vm regarding appt date/time.

## 2016-12-25 ENCOUNTER — Ambulatory Visit
Admission: RE | Admit: 2016-12-25 | Discharge: 2016-12-25 | Disposition: A | Discharge status: Home / Self Care | Payer: Medicare Other | Source: Ambulatory Visit | Attending: Internal Medicine | Admitting: Internal Medicine

## 2016-12-25 ENCOUNTER — Other Ambulatory Visit: Payer: Self-pay | Admitting: Internal Medicine

## 2016-12-25 DIAGNOSIS — F17208 Nicotine dependence, unspecified, with other nicotine-induced disorders: Secondary | ICD-10-CM

## 2016-12-25 DIAGNOSIS — R059 Cough, unspecified: Secondary | ICD-10-CM

## 2016-12-25 DIAGNOSIS — R05 Cough: Secondary | ICD-10-CM

## 2016-12-25 DIAGNOSIS — R911 Solitary pulmonary nodule: Secondary | ICD-10-CM | POA: Diagnosis not present

## 2016-12-25 IMAGING — CT CT CHEST W/O CM
3 of 4 series · 17 of 30 positions shown, 19 images · non-contrast
Comparison: [DATE]

CLINICAL DATA: Shortness of breath and cough

EXAM:
CT CHEST WITHOUT CONTRAST
TECHNIQUE: Multidetector CT imaging of the chest was performed following the
standard protocol without IV contrast.

[Series 3: chest w/o · axial · non-contrast · 0.66mm/px · z∈[-192,-5]mm · 6 of 107 slices shown]
[im 16/107  lung]
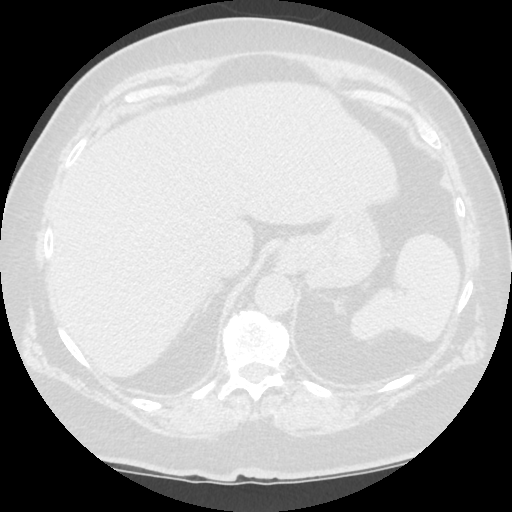
[im 31/107  lung]
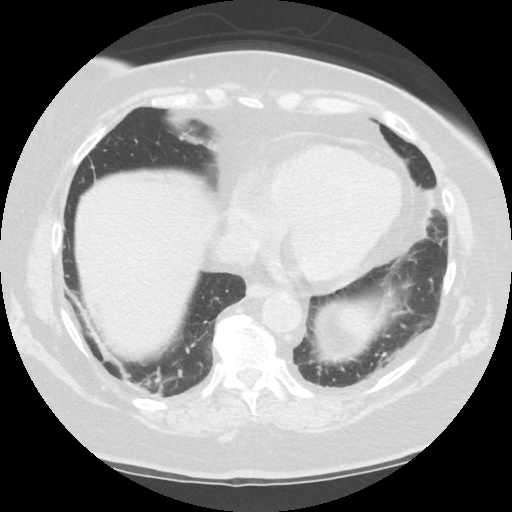
[im 46/107  lung]
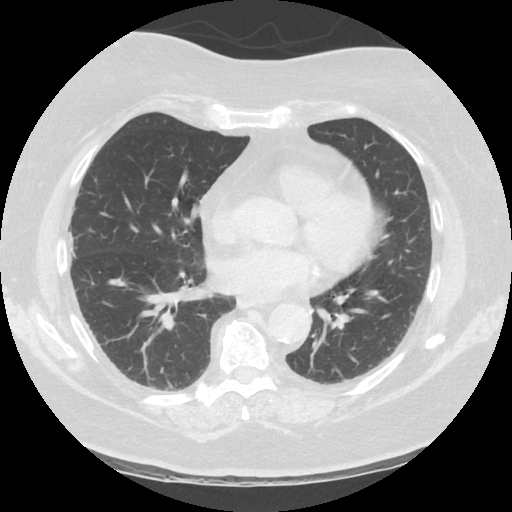
[im 61/107  lung]
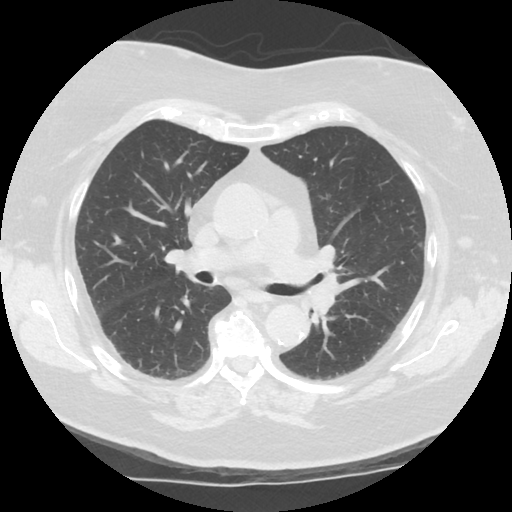
[im 76/107  lung]
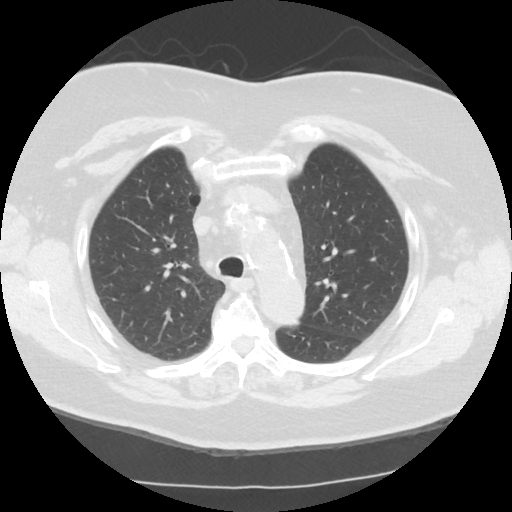
[im 91/107  lung]
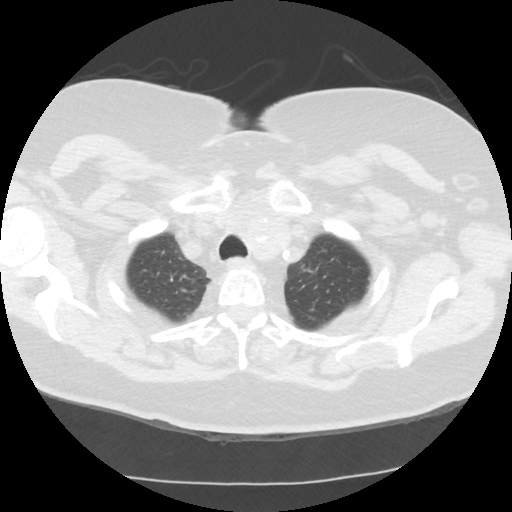

[Series 4: lung windows · axial · 0.66mm/px · z∈[-197,+0]mm · 7 of 107 slices shown, 9 images]
[im 14/107  mediastinal]
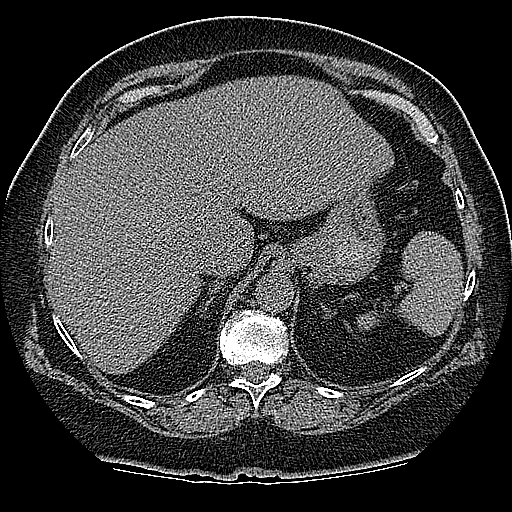
[im 14/107  lung]
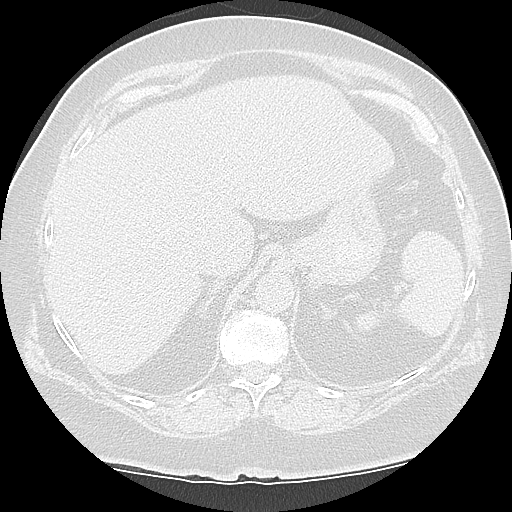
[im 27/107  lung]
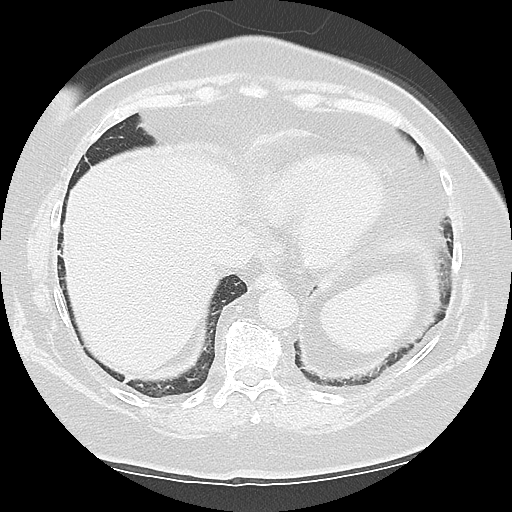
[im 40/107  lung]
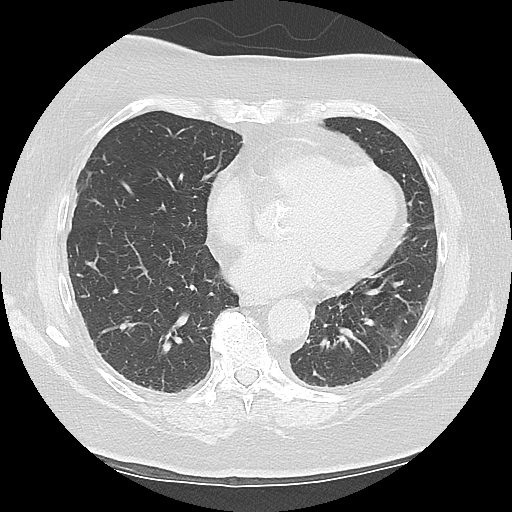
[im 54/107  lung]
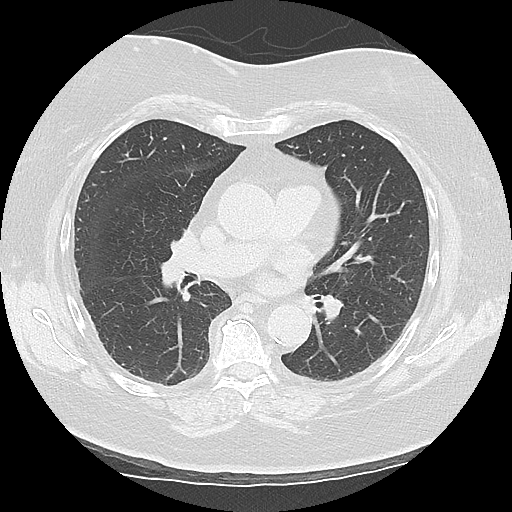
[im 67/107  mediastinal]
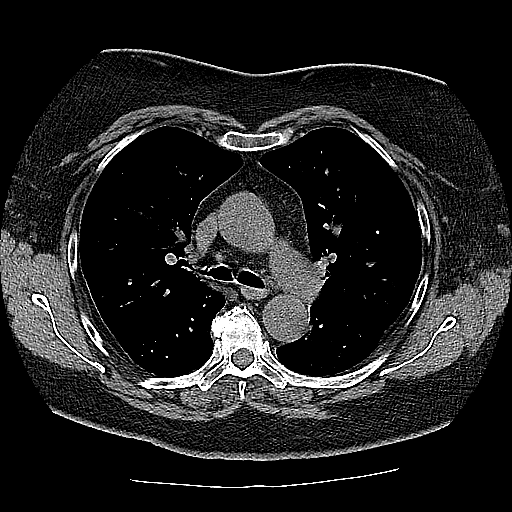
[im 67/107  lung]
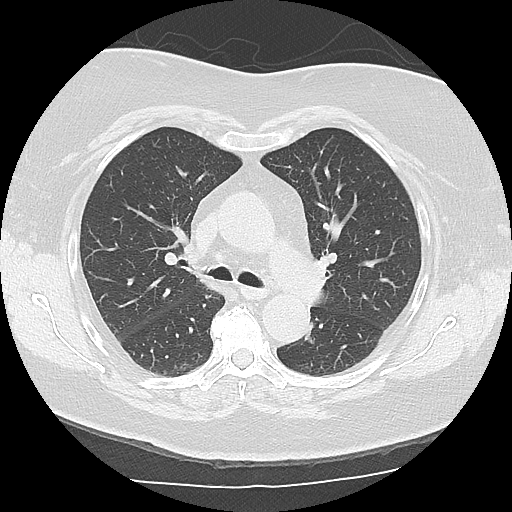
[im 80/107  lung]
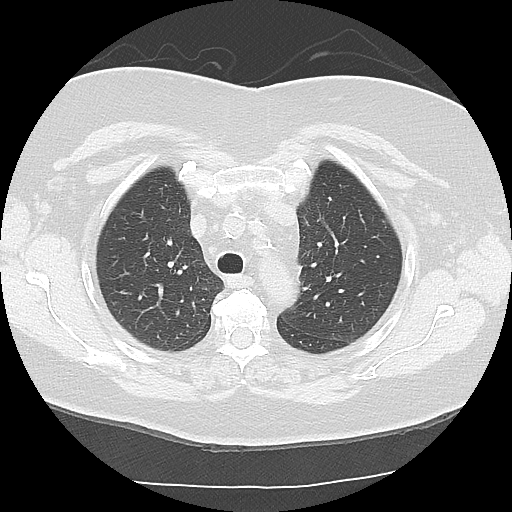
[im 93/107  lung]
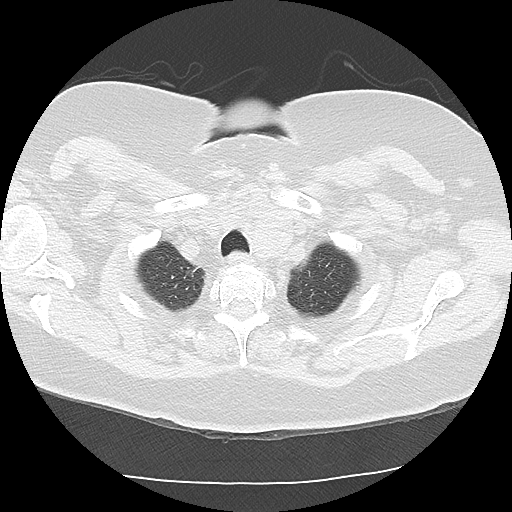

[Series 602: sagittal body · sagittal · 0.66mm/px · 4 of 136 slices shown]
[im 14/136  mediastinal]
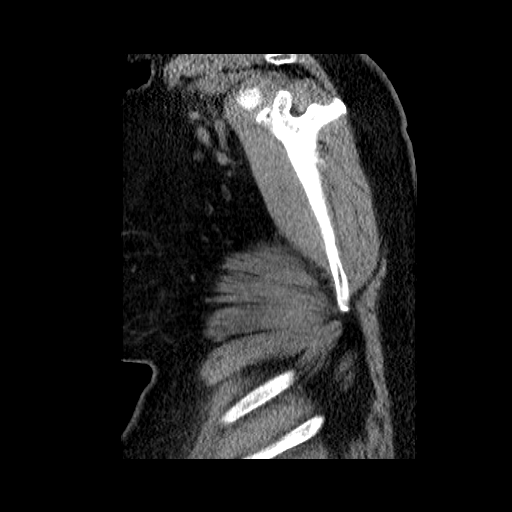
[im 28/136  mediastinal]
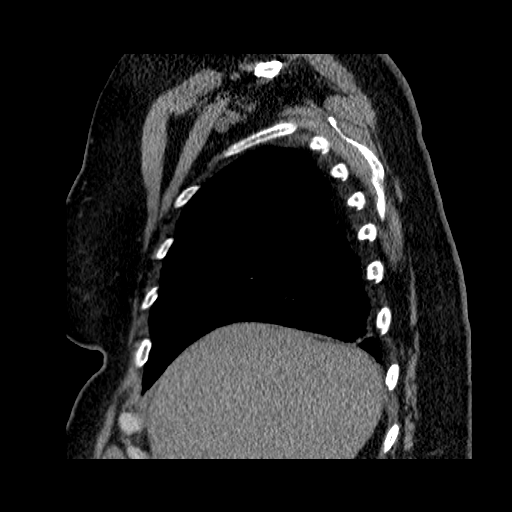
[im 41/136  mediastinal]
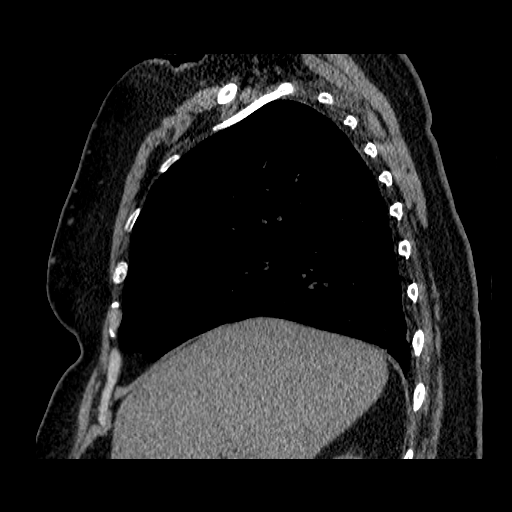
[im 55/136  mediastinal]
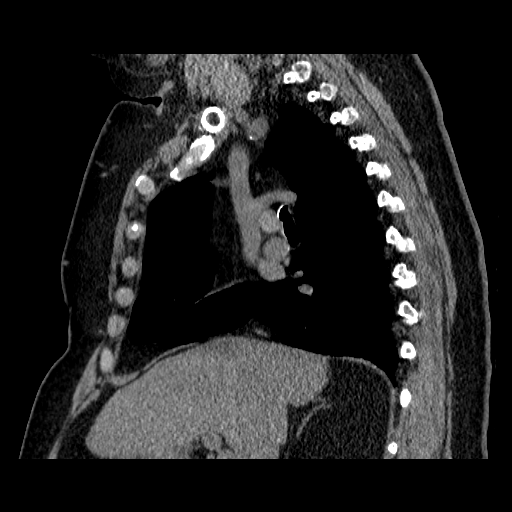

[17 of 30 positions shown; findings below may reference images not displayed]

FINDINGS: Cardiovascular: Somewhat limited due to lack of IV contrast. Diffuse
aortic calcifications are seen. No aneurysmal dilatation is noted.
Coronary calcifications are seen. No cardiac enlargement is noted.

Mediastinum/Nodes: The thoracic inlet demonstrates prominent thyroid
gland stable in appearance from the prior exam. A nodule is noted
inferiorly in the left lobe. No hilar or mediastinal adenopathy is
seen. No axillary adenopathy is noted.

Lungs/Pleura: The lungs are well aerated bilaterally. Minimal
scarring is noted in the bases without focal infiltrate. No sizable
parenchymal nodule is seen.

Upper Abdomen: No acute abnormality.

Musculoskeletal: No chest wall mass or suspicious bone lesions
identified. Degenerative changes of the thoracic spine are noted.
IMPRESSION: Prominent but stable thyroid gland with left-sided nodule.

Mild scarring in the bases bilaterally. No acute abnormality is
seen.

## 2016-12-26 ENCOUNTER — Other Ambulatory Visit: Payer: Medicare Other

## 2017-01-17 ENCOUNTER — Ambulatory Visit (HOSPITAL_BASED_OUTPATIENT_CLINIC_OR_DEPARTMENT_OTHER): Payer: Medicare Other | Admitting: Hematology

## 2017-01-17 ENCOUNTER — Other Ambulatory Visit: Payer: Self-pay | Admitting: Hematology

## 2017-01-17 ENCOUNTER — Other Ambulatory Visit: Payer: Self-pay | Admitting: *Deleted

## 2017-01-17 ENCOUNTER — Other Ambulatory Visit (HOSPITAL_BASED_OUTPATIENT_CLINIC_OR_DEPARTMENT_OTHER): Payer: Medicare Other

## 2017-01-17 ENCOUNTER — Encounter: Payer: Medicare Other | Admitting: Hematology

## 2017-01-17 ENCOUNTER — Encounter: Payer: Self-pay | Admitting: Hematology

## 2017-01-17 VITALS — BP 164/83 | HR 113 | Temp 98.0°F | Ht 63.5 in | Wt 188.0 lb

## 2017-01-17 DIAGNOSIS — D72829 Elevated white blood cell count, unspecified: Secondary | ICD-10-CM

## 2017-01-17 DIAGNOSIS — M8580 Other specified disorders of bone density and structure, unspecified site: Secondary | ICD-10-CM | POA: Insufficient documentation

## 2017-01-17 DIAGNOSIS — D509 Iron deficiency anemia, unspecified: Secondary | ICD-10-CM

## 2017-01-17 DIAGNOSIS — E785 Hyperlipidemia, unspecified: Secondary | ICD-10-CM

## 2017-01-17 DIAGNOSIS — N390 Urinary tract infection, site not specified: Secondary | ICD-10-CM | POA: Diagnosis not present

## 2017-01-17 DIAGNOSIS — D508 Other iron deficiency anemias: Secondary | ICD-10-CM

## 2017-01-17 DIAGNOSIS — F32A Depression, unspecified: Secondary | ICD-10-CM | POA: Insufficient documentation

## 2017-01-17 DIAGNOSIS — R319 Hematuria, unspecified: Secondary | ICD-10-CM

## 2017-01-17 DIAGNOSIS — D649 Anemia, unspecified: Secondary | ICD-10-CM | POA: Insufficient documentation

## 2017-01-17 DIAGNOSIS — D126 Benign neoplasm of colon, unspecified: Secondary | ICD-10-CM | POA: Insufficient documentation

## 2017-01-17 DIAGNOSIS — E041 Nontoxic single thyroid nodule: Secondary | ICD-10-CM | POA: Insufficient documentation

## 2017-01-17 DIAGNOSIS — N200 Calculus of kidney: Secondary | ICD-10-CM | POA: Insufficient documentation

## 2017-01-17 DIAGNOSIS — E1169 Type 2 diabetes mellitus with other specified complication: Secondary | ICD-10-CM | POA: Insufficient documentation

## 2017-01-17 DIAGNOSIS — F329 Major depressive disorder, single episode, unspecified: Secondary | ICD-10-CM | POA: Insufficient documentation

## 2017-01-17 LAB — CBC & DIFF AND RETIC
BASO%: 0.5 % (ref 0.0–2.0)
BASOS ABS: 0.1 10*3/uL (ref 0.0–0.1)
EOS%: 3 % (ref 0.0–7.0)
Eosinophils Absolute: 0.3 10*3/uL (ref 0.0–0.5)
HEMATOCRIT: 36.2 % (ref 34.8–46.6)
HEMOGLOBIN: 11 g/dL — AB (ref 11.6–15.9)
Immature Retic Fract: 24.9 % — ABNORMAL HIGH (ref 1.60–10.00)
LYMPH%: 25.1 % (ref 14.0–49.7)
MCH: 25.7 pg (ref 25.1–34.0)
MCHC: 30.4 g/dL — ABNORMAL LOW (ref 31.5–36.0)
MCV: 84.6 fL (ref 79.5–101.0)
MONO#: 0.9 10*3/uL (ref 0.1–0.9)
MONO%: 8.1 % (ref 0.0–14.0)
NEUT#: 6.9 10*3/uL — ABNORMAL HIGH (ref 1.5–6.5)
NEUT%: 63.3 % (ref 38.4–76.8)
NRBC: 0 % (ref 0–0)
Platelets: 350 10*3/uL (ref 145–400)
RBC: 4.28 10*6/uL (ref 3.70–5.45)
RDW: 17.4 % — AB (ref 11.2–14.5)
Retic %: 2.38 % — ABNORMAL HIGH (ref 0.70–2.10)
Retic Ct Abs: 101.86 10*3/uL — ABNORMAL HIGH (ref 33.70–90.70)
WBC: 10.9 10*3/uL — ABNORMAL HIGH (ref 3.9–10.3)
lymph#: 2.7 10*3/uL (ref 0.9–3.3)

## 2017-01-17 LAB — URINALYSIS, MICROSCOPIC - CHCC
Bilirubin (Urine): NEGATIVE
Glucose: NEGATIVE mg/dL
KETONES: NEGATIVE mg/dL
Nitrite: NEGATIVE
Specific Gravity, Urine: 1.02 (ref 1.003–1.035)
Urobilinogen, UR: 0.2 mg/dL (ref 0.2–1)
pH: 6 (ref 4.6–8.0)

## 2017-01-17 LAB — IRON AND TIBC
%SAT: 6 % — ABNORMAL LOW (ref 21–57)
IRON: 22 ug/dL — AB (ref 41–142)
TIBC: 355 ug/dL (ref 236–444)
UIBC: 333 ug/dL (ref 120–384)

## 2017-01-17 LAB — COMPREHENSIVE METABOLIC PANEL
ALBUMIN: 3.7 g/dL (ref 3.5–5.0)
ALK PHOS: 95 U/L (ref 40–150)
ALT: 27 U/L (ref 0–55)
AST: 24 U/L (ref 5–34)
Anion Gap: 12 mEq/L — ABNORMAL HIGH (ref 3–11)
BUN: 12 mg/dL (ref 7.0–26.0)
CALCIUM: 10.2 mg/dL (ref 8.4–10.4)
CO2: 23 mEq/L (ref 22–29)
Chloride: 108 mEq/L (ref 98–109)
Creatinine: 1.2 mg/dL — ABNORMAL HIGH (ref 0.6–1.1)
EGFR: 43 mL/min/{1.73_m2} — AB (ref >90)
Glucose: 161 mg/dl — ABNORMAL HIGH (ref 70–140)
POTASSIUM: 4.6 meq/L (ref 3.5–5.1)
SODIUM: 143 meq/L (ref 136–145)
Total Bilirubin: 0.23 mg/dL (ref 0.20–1.20)
Total Protein: 7.7 g/dL (ref 6.4–8.3)

## 2017-01-17 LAB — FERRITIN: Ferritin: 17 ng/ml (ref 9–269)

## 2017-01-17 LAB — CHCC SMEAR

## 2017-01-17 LAB — LACTATE DEHYDROGENASE: LDH: 107 U/L — ABNORMAL LOW (ref 125–245)
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
## 2017-01-17 NOTE — Progress Notes (Signed)
err

## 2017-01-18 LAB — URINE CULTURE

## 2017-01-18 LAB — SEDIMENTATION RATE: SED RATE: 47 mm/h — AB (ref 0–40)

## 2017-01-21 DIAGNOSIS — R195 Other fecal abnormalities: Secondary | ICD-10-CM | POA: Diagnosis not present
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
## 2017-01-25 ENCOUNTER — Telehealth: Payer: Self-pay | Admitting: Hematology

## 2017-01-25 NOTE — Telephone Encounter (Signed)
No LOS per 01/17/17 D.O.S.

## 2017-01-30 DIAGNOSIS — K921 Melena: Secondary | ICD-10-CM | POA: Diagnosis not present

## 2017-01-30 DIAGNOSIS — Z86010 Personal history of colonic polyps: Secondary | ICD-10-CM | POA: Diagnosis not present

## 2017-02-14 DIAGNOSIS — K210 Gastro-esophageal reflux disease with esophagitis: Secondary | ICD-10-CM | POA: Diagnosis not present

## 2017-02-14 DIAGNOSIS — R195 Other fecal abnormalities: Secondary | ICD-10-CM | POA: Diagnosis not present

## 2017-02-14 DIAGNOSIS — D509 Iron deficiency anemia, unspecified: Secondary | ICD-10-CM | POA: Diagnosis not present

## 2017-02-14 DIAGNOSIS — K449 Diaphragmatic hernia without obstruction or gangrene: Secondary | ICD-10-CM | POA: Diagnosis not present

## 2017-02-19 DIAGNOSIS — K210 Gastro-esophageal reflux disease with esophagitis: Secondary | ICD-10-CM | POA: Diagnosis not present

## 2017-03-21 DIAGNOSIS — D500 Iron deficiency anemia secondary to blood loss (chronic): Secondary | ICD-10-CM | POA: Diagnosis not present

## 2017-03-21 DIAGNOSIS — K921 Melena: Secondary | ICD-10-CM | POA: Diagnosis not present

## 2017-03-25 DIAGNOSIS — R3121 Asymptomatic microscopic hematuria: Secondary | ICD-10-CM | POA: Diagnosis not present

## 2017-04-02 DIAGNOSIS — K921 Melena: Secondary | ICD-10-CM | POA: Diagnosis not present

## 2017-04-02 DIAGNOSIS — R3129 Other microscopic hematuria: Secondary | ICD-10-CM | POA: Diagnosis not present

## 2017-04-02 DIAGNOSIS — R3121 Asymptomatic microscopic hematuria: Secondary | ICD-10-CM | POA: Diagnosis not present

## 2017-04-10 ENCOUNTER — Other Ambulatory Visit: Payer: Self-pay | Admitting: Urology

## 2017-04-10 DIAGNOSIS — R3121 Asymptomatic microscopic hematuria: Secondary | ICD-10-CM | POA: Diagnosis not present

## 2017-04-10 DIAGNOSIS — N201 Calculus of ureter: Secondary | ICD-10-CM | POA: Diagnosis not present

## 2017-04-10 DIAGNOSIS — N202 Calculus of kidney with calculus of ureter: Secondary | ICD-10-CM | POA: Diagnosis not present

## 2017-04-11 ENCOUNTER — Encounter (HOSPITAL_BASED_OUTPATIENT_CLINIC_OR_DEPARTMENT_OTHER): Payer: Self-pay | Admitting: *Deleted

## 2017-04-12 ENCOUNTER — Encounter (HOSPITAL_BASED_OUTPATIENT_CLINIC_OR_DEPARTMENT_OTHER): Payer: Self-pay | Admitting: *Deleted

## 2017-04-12 NOTE — Progress Notes (Signed)
NPO AFTER MN.  ARRIVE AT 0915.  NEEDS ISTAT, KUB, AND EKG.  WILL TAKE CELEXA AND DO ANORO INHALER AM DOS W/ SIPS OF WATER.

## 2017-04-14 NOTE — H&P (Signed)
HPI: [name] [name] is a 76 year-old female with a left ureteral stone.  She did not see the blood in her urine. She has been told that they had blood in the urine in the past.   She does have a history of smoking. She does not have a history of exposure to chemicals or fumes. She does not have a history of urinary infections. She does not have a burning sensation when she urinates.   Nephrolithiasis: She was seen in 2/02 by Dr. Rosana Hoes and found at that time to have normal-appearing kidneys by renal ultrasound with a 4 mm right upper pole stone seen on KUB. She has passed multiple stones previously. On a CT scan done 2/11, done for gross hematuria, she was found to have a 3 mm left ureteral calculus which eventually passed. In addition the CT scan revealed bilateral nephrolithiasis. There were punctate stone seen on the left-hand side and a single stone seen in the midpole the right kidney which has remained unchanged for quite some time.   Interval history 03/25/17: She had a CT scan done in 10/14 which revealed bilateral renal calculi. A urinalysis in 1/18 had TNTC RBCs and a urine culture was found to be negative.  She reports that she has not experienced any gross hematuria. She doesn't have any symptoms to suggest the passage of a stone and told me that she believes she has a stone "embedded" in her right kidney and does have some soreness in her right flank but says this has been present for many years. She said she smokes intermittently. She denies any change in her voiding pattern.   Interval history 04/10/17: She reports she is not having any flank pain or hematuria. No new voiding complaints are noted.     ALLERGIES: No Allergies    MEDICATIONS: Amlodipine Besylate 5 mg tablet Oral  Aspirin 81 MG TABS Oral  Benazepril HCl - 10 MG Oral Tablet Oral  Ciprofloxacin HCl - 500 MG Oral Tablet Oral  Cyclobenzaprine HCl - 10 MG Oral Tablet Oral  Escitalopram Oxalate 10 mg tablet Oral  Estrace  0.1 MG/GM Vaginal Cream 0 Vaginal  Fish Oil CAPS Oral  Fluticasone Propionate 0.05 % External Cream 1 External  MetFORMIN HCl - 1000 MG Oral Tablet Oral  Niacin Er 1,000 mg tablet, extended release 24 hr Oral  Simvastatin 20 MG Oral Tablet Oral  Vitamin B-Complex With Vit C tablet Oral  Vitamin D 50000 UNIT CAPS Oral     GU PSH: Locm 300-399Mg /Ml Iodine,1Ml - 04/02/2017      PSH Notes: Tubal Ligation, Gallbladder Surgery, Wrist Surgery   NON-GU PSH: Tubal Ligation - 2011    GU PMH: Asymptomatic microscopic hematuria, I have discussed with the patient that the evaluation of asymptomatic microscopic hematuria consists of excluding benign causes including menstruation in women as well as, in both sexes, vigorous exercise, sexual activity, viral illnesses, trauma and infection. If these factors are negative and the urinalysis reveals significant proteinuria, dysmorphic red blood cells or red cell casts and/or an elevated serum creatinine then evaluation by a nephrologist is indicated. If conditions suggestive of primary renal disease are not present and the patient has a low risk of urothelial malignancy consisting of age less than 40 years, no smoking history, no history of chemical exposure, no irritative voiding symptoms, no history of gross hematuria and no history of urologic disorder or disease then upper tract imaging should be undertaken with a CT scan and consideration of cystoscopy versus  urine cytology as well. If, on the other hand the patient is at high risk then a complete evaluation should be undertaken including upper tract imaging with CT scan and cystoscopy and consideration of urine cytology as well. - 03/25/2017 Kidney Stone, Bilateral kidney stones - 02-Apr-2014 Other microscopic hematuria, Microscopic hematuria - 04/02/14 Postmenopausal atrophic vaginitis, Atrophic vaginitis - 2014/04/02 Calculus Ureter, Ureteral Stone - 04-02-2013 Gross hematuria, Gross Hematuria - Apr 02, 2013 Hydronephrosis Unspec,  Hydronephrosis On The Left - 04/02/13 Personal Hx urinary calculi, Nephrolithiasis - 2013/04/02      PMH Notes:  2010-02-02 14:20:26 - Note: Arthritis   NON-GU PMH: Encounter for general adult medical examination without abnormal findings, Encounter for preventive health examination - 04-02-2014 Anxiety, Anxiety (Symptom) - 2013/04/02 Personal history of other diseases of the circulatory system, History of hypertension - 2013/04/02 Personal history of other diseases of the nervous system and sense organs, History of sleep apnea - 04-02-13 Personal history of other endocrine, nutritional and metabolic disease, History of diabetes mellitus - 04/02/13, History of hypercholesterolemia, - April 02, 2013 Personal history of other specified conditions, History of heartburn - 02-Apr-2013    FAMILY HISTORY: Death In The Family Father - Father Death In The Family Mother - Mother Family Health Status Number - Runs In Family   SOCIAL HISTORY: Marital Status: Divorced Current Smoking Status: Patient does not smoke anymore.   Tobacco Use Assessment Completed: Used Tobacco in last 30 days? Does drink.  Drinks 2 caffeinated drinks per day.     Notes: Former smoker, Marital History - Divorced, Tobacco Use, Alcohol Use, Caffeine Use, Occupation:   REVIEW OF SYSTEMS:    GU Review Female:   Patient denies frequent urination, hard to postpone urination, burning /pain with urination, get up at night to urinate, leakage of urine, stream starts and stops, trouble starting your stream, have to strain to urinate, and currently pregnant.  Gastrointestinal (Upper):   Patient denies nausea, vomiting, and indigestion/ heartburn.  Gastrointestinal (Lower):   Patient denies diarrhea and constipation.  Constitutional:   Patient denies fatigue, night sweats, fever, and weight loss.  Skin:   Patient denies skin rash/ lesion and itching.  Eyes:   Patient denies blurred vision and double vision.  Ears/ Nose/ Throat:   Patient denies sore throat and sinus problems.   Hematologic/Lymphatic:   Patient denies swollen glands and easy bruising.  Cardiovascular:   Patient denies leg swelling and chest pains.  Respiratory:   Patient denies cough and shortness of breath.  Endocrine:   Patient denies excessive thirst.  Musculoskeletal:   Patient denies back pain and joint pain.  Neurological:   Patient denies headaches and dizziness.  Psychologic:   Patient denies depression and anxiety.   VITAL SIGNS:    Weight 180 lb / 81.65 kg  Height 64 in / 162.56 cm  BP 137/82 mmHg  Pulse 80 /min  BMI 30.9 kg/m   GU PHYSICAL EXAMINATION:    External Genitalia: No hirsuitism, no rash, no scarring, no cyst, no erythematous lesion, no papular lesion, no blanched lesion, no warty lesion, no labial adhesions, no atrophic introitus. No edema.   Urethral Meatus: Normal size. Normal position. No discharge.  Urethra: No tenderness, no mass, no scarring. No hypermobility. No leakage.   MULTI-SYSTEM PHYSICAL EXAMINATION:  Constitutional: Well-nourished. No physical deformities. Normally developed. Good grooming.  Neck: Neck symmetrical, not swollen. Normal tracheal position.  Respiratory: No labored breathing, no use of accessory muscles.   Cardiovascular: Normal temperature, normal extremity pulses, no swelling, no varicosities.  Lymphatic: No enlargement of neck, axillae, groin.  Skin: No paleness, no jaundice, no cyanosis. No lesion, no ulcer, no rash.  Neurologic / Psychiatric: Oriented to time, oriented to place, oriented to person. No depression, no anxiety, no agitation.  Gastrointestinal: No mass, no tenderness, no rigidity, non obese abdomen.  Eyes: Normal conjunctivae. Normal eyelids.  Ears, Nose, Mouth, and Throat: Left ear no scars, no lesions, no masses. Right ear no scars, no lesions, no masses. Nose no scars, no lesions, no masses. Normal hearing. Normal lips.  Musculoskeletal: Normal gait and station of head and neck.        PAST DATA REVIEWED:  Source Of  History:  Patient  Lab Test Review:   BUN/Creatinine  Records Review:   Previous Patient Records, POC Tool  X-Ray Review: C.T. Hematuria: Reviewed Films. Reviewed Report. Discussed With Patient. EXAM: CT ABDOMEN AND PELVIS WITHOUT AND WITH CONTRAST TECHNIQUE: Multidetector CT imaging of the abdomen and pelvis was performed following the standard protocol before and following the bolus administration of intravenous contrast. CONTRAST: 125 cc Isovue 300. COMPARISON: 10/28/2013 FINDINGS: Lower chest: Atelectasis or scarring noted in lingula. Hepatobiliary: Tiny hypervascular focus in the hepatic dome is unchanged, likely related to vascular malformation. Gallbladder surgically absent. No intrahepatic or extrahepatic biliary dilation. Pancreas: No focal mass lesion. No dilatation of the main duct. No intraparenchymal cyst. No peripancreatic edema. Spleen: No splenomegaly. No focal mass lesion. Adrenals/Urinary Tract: No adrenal nodule or mass. Pre contrast imaging shows multiple stones in the right kidney. The largest is in the upper pole towards the interpolar region region measuring 13 x 9 x 21 mm. No right hydronephrosis. No right ureteral stone. Multiple tiny stones are scattered in the left kidney without hydronephrosis. These range in size from 1-2 mm up to 3-4 mm. 5 x 7 x 8 mm stone is identified in the distal left ureter, just distal to where the ureter crosses the iliac vasculature (see image 58 of series 2). No evidence for bladder stones. After IV contrast administration areas of cortical scarring are noted in the kidneys bilaterally. Small cysts noted interpolar left kidney. No enhancing mass in either kidney. Delayed imaging shows no wall thickening or soft tissue filling defect in either intrarenal collecting system or renal pelvis. No ureteral wall thickening. Delayed imaging through the bladder shows no focal bladder wall abnormality with limited assessment of a small portion of the posterior bladder  wall secondary to adjacent non-opacified urine. Stomach/Bowel: Stomach is nondistended. No gastric wall thickening. No evidence of outlet obstruction. Duodenum is normally positioned as is the ligament of Treitz. No small bowel wall thickening. No small bowel dilatation. The terminal ileum is normal. The appendix is normal. Diverticular changes are noted in the left colon without evidence of diverticulitis. Vascular/Lymphatic: There is abdominal aortic atherosclerosis without aneurysm. Small lymph nodes in the gastrohepatic ligament and hepatoduodenal ligament are stable. Small retroperitoneal lymph nodes are stable. No pelvic sidewall lymphadenopathy. Reproductive: The uterus has normal CT imaging appearance. There is no adnexal mass. Other: No intraperitoneal free fluid. Musculoskeletal: Bone windows reveal no worrisome lytic or sclerotic osseous lesions. IMPRESSION: 1. Bilateral nonobstructing renal stones, as before, with a 5 x 7 x 8 mm stone in the distal left ureter without hydroureteronephrosis. 2. Bilateral cortical scarring with small cysts left kidney. 3. Left colonic diverticulosis without diverticulitis. 4. Abdominal Aortic Atherosclerois    Notes:  Cr on 03/25/17 - 0.9   PROCEDURES:         Flexible Cystoscopy - 52000  Risks, benefits, and some of the potential complications of the procedure were discussed at length with the patient including infection, bleeding, voiding discomfort and others. All questions were answered. Sterile technique and intraurethral analgesia were used.  Meatus:  Normal size. Normal location. Normal condition.  Urethra:  No hypermobility. No leakage.  Ureteral Orifices:  Normal location. Normal size. Normal shape. Effluxed clear urine.  Bladder:  No trabeculation. No tumors. Normal mucosa. No stones.      The lower urinary tract was carefully examined. The procedure was well-tolerated and without complications. Instructions were given to call the  office immediately for bloody urine, difficulty urinating, urinary retention, painful or frequent urination, fever or other illness. The patient stated that she understood these instructions and would comply with them.         Urinalysis w/Scope Dipstick Dipstick Cont'd Micro  Color: Yellow Bilirubin: Neg WBC/hpf: 6 - 10/hpf  Appearance: Cloudy Ketones: Neg RBC/hpf: 20 - 40/hpf  Specific Gravity: 1.025 Blood: 3+ Bacteria: Few (10-25/hpf)  pH: 5.5 Protein: Trace Cystals: NS (Not Seen)  Glucose: Neg Urobilinogen: 0.2 Casts: NS (Not Seen)    Nitrites: Neg Trichomonas: Not Present    Leukocyte Esterase: 1+ Mucous: Not Present      Epithelial Cells: 0 - 5/hpf      Yeast: NS (Not Seen)      Sperm: Not Present    ASSESSMENT/PLAN:      ICD-10 Details  1 GU:   Calculus Ureter - N20.1 Left, We discussed the management of urinary stones. These options include observation, ureteroscopy, shockwave lithotripsy, and PCNL. We discussed which options are relevant to these particular stones. We discussed the natural history of stones as well as the complications of untreated stones and the impact on quality of life without treatment as well as with each of the above listed treatments. We also discussed the efficacy of each treatment in its ability to clear the stone burden. With any of these management options I discussed the signs and symptoms of infection and the need for emergent treatment should these be experienced. For each option we discussed the ability of each procedure to clear the patient of their stone burden. For observation I described the risks which include but are not limited to silent renal damage, life-threatening infection, need for emergent surgery, failure to pass stone, and pain. For ureteroscopy I described the risks which include heart attack, stroke, pulmonary embolus, death, bleeding, infection, damage to contiguous structures, positioning injury, ureteral stricture, ureteral avulsion,  ureteral injury, need for ureteral stent, inability to perform ureteroscopy, need for an interval procedure, inability to clear stone burden, stent discomfort and pain. For shockwave lithotripsy I described the risks which include arrhythmia, kidney contusion, kidney hemorrhage, need for transfusion, long-term risk of diabetes or hypertension, back discomfort, flank ecchymosis, flank abrasion, inability to break up stone, inability to pass stone fragments, Steinstrasse, infection associated with obstructing stones, need for different surgical procedure, need for repeat shockwave lithotripsy, and death.   2   Kidney Stone - N20.0 Bilateral, Stable - She has bilateral renal calculi that are nonobstructing.  3   Asymptomatic microscopic hematuria - R31.21 Stable - It appears for microscopic hematuria is secondary to her left ureteral stone. It is also possible that her renal calculi could cause some blood in the urine even after I have removed her left ureteral calculus.  Notes:   Because of the location of her stone with Hounsfield units of 800 I have recommended ureteroscopic management.

## 2017-04-15 ENCOUNTER — Ambulatory Visit (HOSPITAL_BASED_OUTPATIENT_CLINIC_OR_DEPARTMENT_OTHER): Payer: Medicare Other | Admitting: Anesthesiology

## 2017-04-15 ENCOUNTER — Encounter (HOSPITAL_BASED_OUTPATIENT_CLINIC_OR_DEPARTMENT_OTHER)
Admission: RE | Disposition: A | Discharge status: Home / Self Care | Payer: Self-pay | Source: Ambulatory Visit | Attending: Urology

## 2017-04-15 ENCOUNTER — Ambulatory Visit (HOSPITAL_BASED_OUTPATIENT_CLINIC_OR_DEPARTMENT_OTHER)
Admission: RE | Admit: 2017-04-15 | Discharge: 2017-04-15 | Disposition: A | Discharge status: Home / Self Care | Payer: Medicare Other | Source: Ambulatory Visit | Attending: Urology | Admitting: Urology

## 2017-04-15 ENCOUNTER — Ambulatory Visit (HOSPITAL_COMMUNITY): Payer: Medicare Other

## 2017-04-15 ENCOUNTER — Encounter (HOSPITAL_BASED_OUTPATIENT_CLINIC_OR_DEPARTMENT_OTHER): Payer: Self-pay | Admitting: Anesthesiology

## 2017-04-15 DIAGNOSIS — N201 Calculus of ureter: Secondary | ICD-10-CM

## 2017-04-15 DIAGNOSIS — N202 Calculus of kidney with calculus of ureter: Secondary | ICD-10-CM | POA: Insufficient documentation

## 2017-04-15 DIAGNOSIS — F329 Major depressive disorder, single episode, unspecified: Secondary | ICD-10-CM | POA: Diagnosis not present

## 2017-04-15 DIAGNOSIS — E119 Type 2 diabetes mellitus without complications: Secondary | ICD-10-CM | POA: Insufficient documentation

## 2017-04-15 DIAGNOSIS — G4730 Sleep apnea, unspecified: Secondary | ICD-10-CM | POA: Diagnosis not present

## 2017-04-15 DIAGNOSIS — E785 Hyperlipidemia, unspecified: Secondary | ICD-10-CM | POA: Diagnosis not present

## 2017-04-15 DIAGNOSIS — D649 Anemia, unspecified: Secondary | ICD-10-CM | POA: Diagnosis not present

## 2017-04-15 DIAGNOSIS — F419 Anxiety disorder, unspecified: Secondary | ICD-10-CM | POA: Insufficient documentation

## 2017-04-15 DIAGNOSIS — K219 Gastro-esophageal reflux disease without esophagitis: Secondary | ICD-10-CM | POA: Diagnosis not present

## 2017-04-15 DIAGNOSIS — Z7984 Long term (current) use of oral hypoglycemic drugs: Secondary | ICD-10-CM | POA: Insufficient documentation

## 2017-04-15 DIAGNOSIS — Z79899 Other long term (current) drug therapy: Secondary | ICD-10-CM | POA: Insufficient documentation

## 2017-04-15 DIAGNOSIS — I10 Essential (primary) hypertension: Secondary | ICD-10-CM | POA: Insufficient documentation

## 2017-04-15 DIAGNOSIS — J449 Chronic obstructive pulmonary disease, unspecified: Secondary | ICD-10-CM | POA: Diagnosis not present

## 2017-04-15 DIAGNOSIS — Z87442 Personal history of urinary calculi: Secondary | ICD-10-CM | POA: Diagnosis not present

## 2017-04-15 DIAGNOSIS — Z87891 Personal history of nicotine dependence: Secondary | ICD-10-CM | POA: Insufficient documentation

## 2017-04-15 DIAGNOSIS — Z7982 Long term (current) use of aspirin: Secondary | ICD-10-CM | POA: Insufficient documentation

## 2017-04-15 DIAGNOSIS — E7800 Pure hypercholesterolemia, unspecified: Secondary | ICD-10-CM | POA: Diagnosis not present

## 2017-04-15 HISTORY — DX: Other specified postprocedural states: Z98.890

## 2017-04-15 HISTORY — DX: Nonrheumatic aortic (valve) stenosis: I35.0

## 2017-04-15 HISTORY — DX: Other allergic rhinitis: J30.89

## 2017-04-15 HISTORY — DX: Dependence on other enabling machines and devices: Z99.89

## 2017-04-15 HISTORY — DX: Iron deficiency anemia, unspecified: D50.9

## 2017-04-15 HISTORY — DX: Calculus of ureter: N20.1

## 2017-04-15 HISTORY — DX: Urgency of urination: R39.15

## 2017-04-15 HISTORY — DX: Personal history of urinary calculi: Z87.442

## 2017-04-15 HISTORY — DX: Personal history of adenomatous and serrated colon polyps: Z86.0101

## 2017-04-15 HISTORY — DX: Personal history of colonic polyps: Z86.010

## 2017-04-15 HISTORY — DX: Obstructive sleep apnea (adult) (pediatric): G47.33

## 2017-04-15 HISTORY — DX: Other specified postprocedural states: Z85.9

## 2017-04-15 HISTORY — PX: CYSTOSCOPY WITH RETROGRADE PYELOGRAM, URETEROSCOPY AND STENT PLACEMENT: SHX5789

## 2017-04-15 HISTORY — DX: Hyperlipidemia, unspecified: E78.5

## 2017-04-15 HISTORY — DX: Hematuria, unspecified: R31.9

## 2017-04-15 HISTORY — DX: Chronic obstructive pulmonary disease, unspecified: J44.9

## 2017-04-15 HISTORY — DX: Type 2 diabetes mellitus without complications: E11.9

## 2017-04-15 HISTORY — DX: Other seasonal allergic rhinitis: J30.2

## 2017-04-15 LAB — GLUCOSE, CAPILLARY: Glucose-Capillary: 109 mg/dL — ABNORMAL HIGH (ref 65–99)

## 2017-04-15 LAB — POCT I-STAT, CHEM 8
BUN: 14 mg/dL (ref 6–20)
Calcium, Ion: 1.26 mmol/L (ref 1.15–1.40)
Chloride: 109 mmol/L (ref 101–111)
Creatinine, Ser: 1.3 mg/dL — ABNORMAL HIGH (ref 0.44–1.00)
Glucose, Bld: 115 mg/dL — ABNORMAL HIGH (ref 65–99)
HCT: 35 % — ABNORMAL LOW (ref 36.0–46.0)
Hemoglobin: 11.9 g/dL — ABNORMAL LOW (ref 12.0–15.0)
Potassium: 4.4 mmol/L (ref 3.5–5.1)
Sodium: 143 mmol/L (ref 135–145)
TCO2: 24 mmol/L (ref 0–100)

## 2017-04-15 IMAGING — CR DG ABDOMEN 1V
2 series · 2 of 2 positions shown · non-contrast
Comparison: CT scan dated [DATE]

CLINICAL DATA: Left ureteral stone

EXAM:
ABDOMEN - 1 VIEW

[t abdomen supine (1 of 2)]
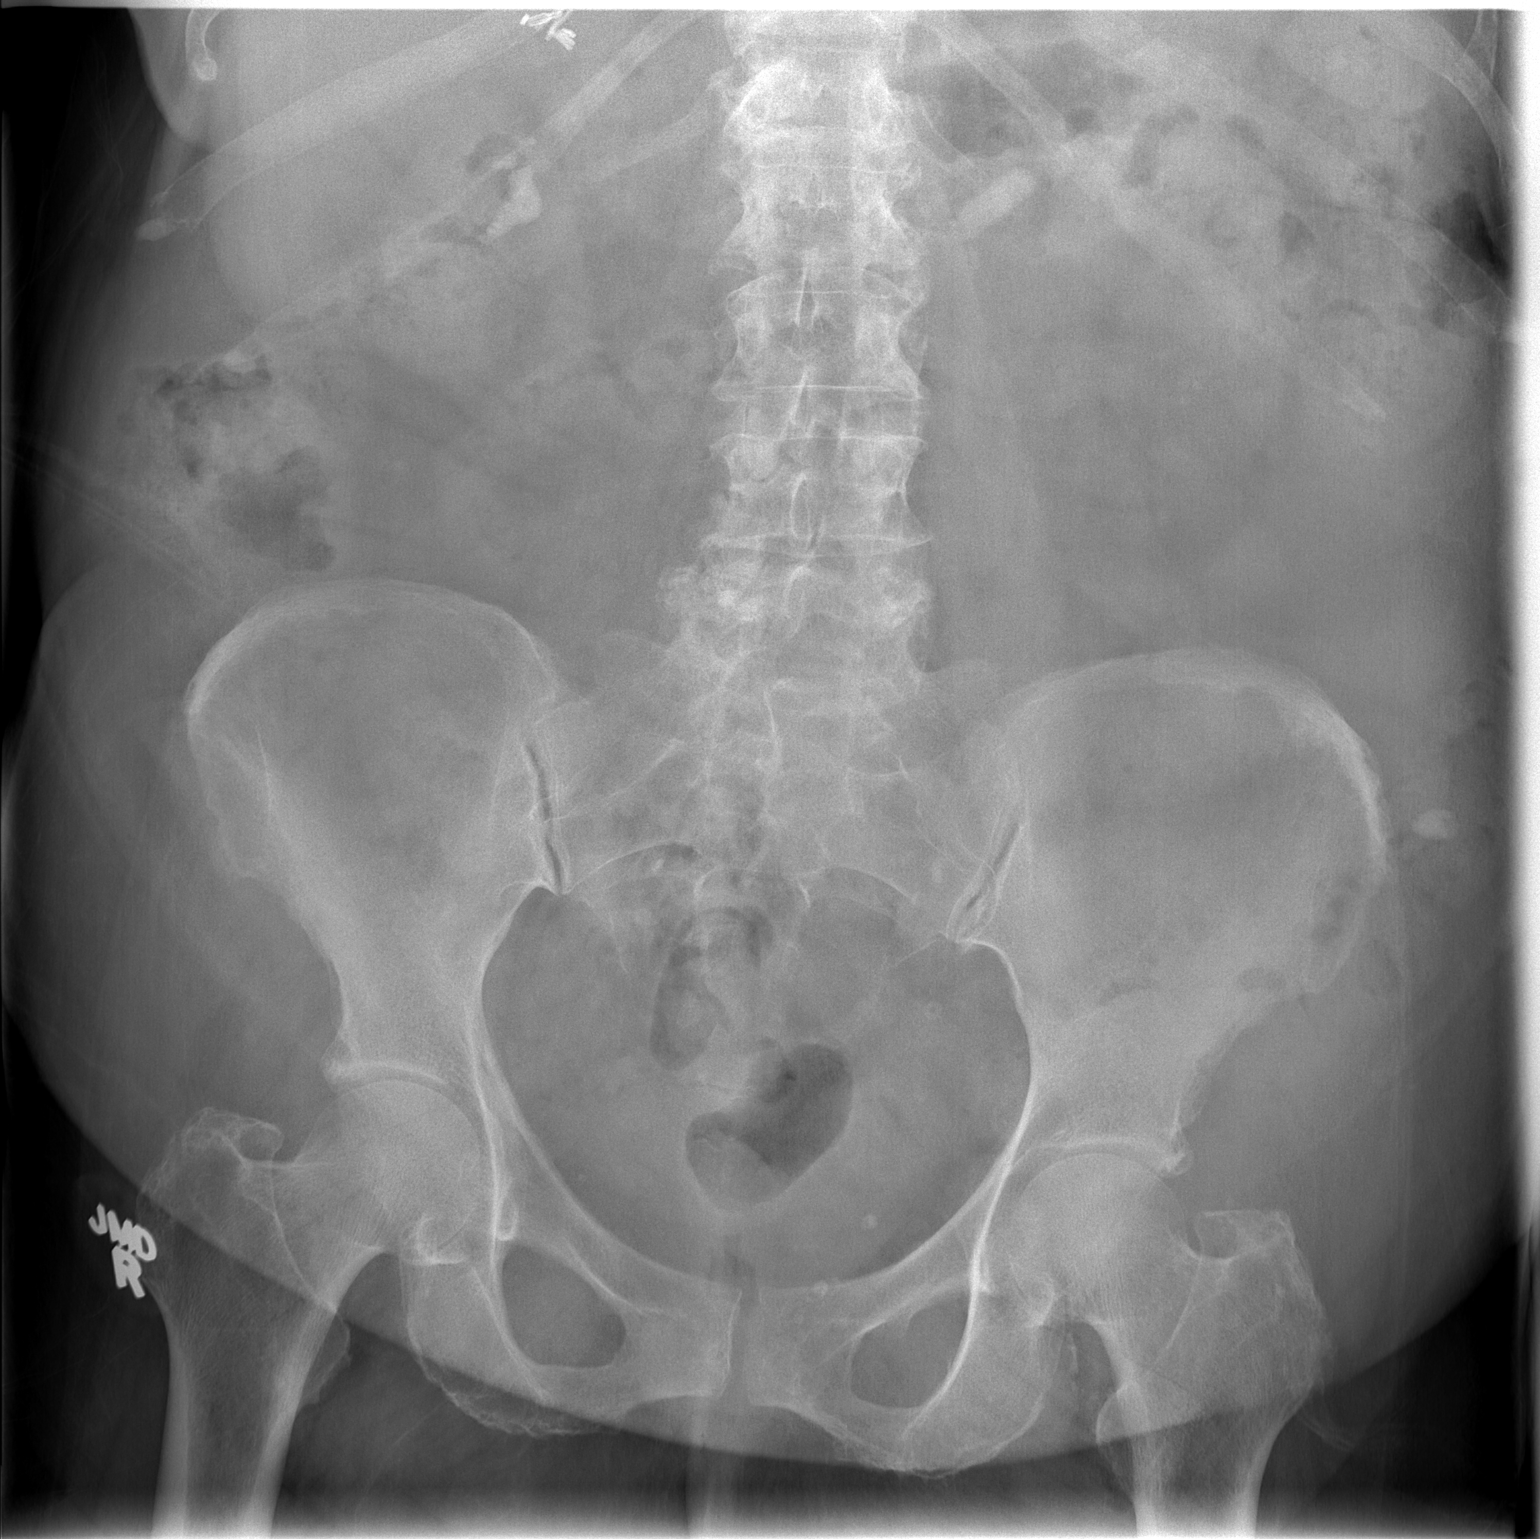

[t abdomen supine (2 of 2)]
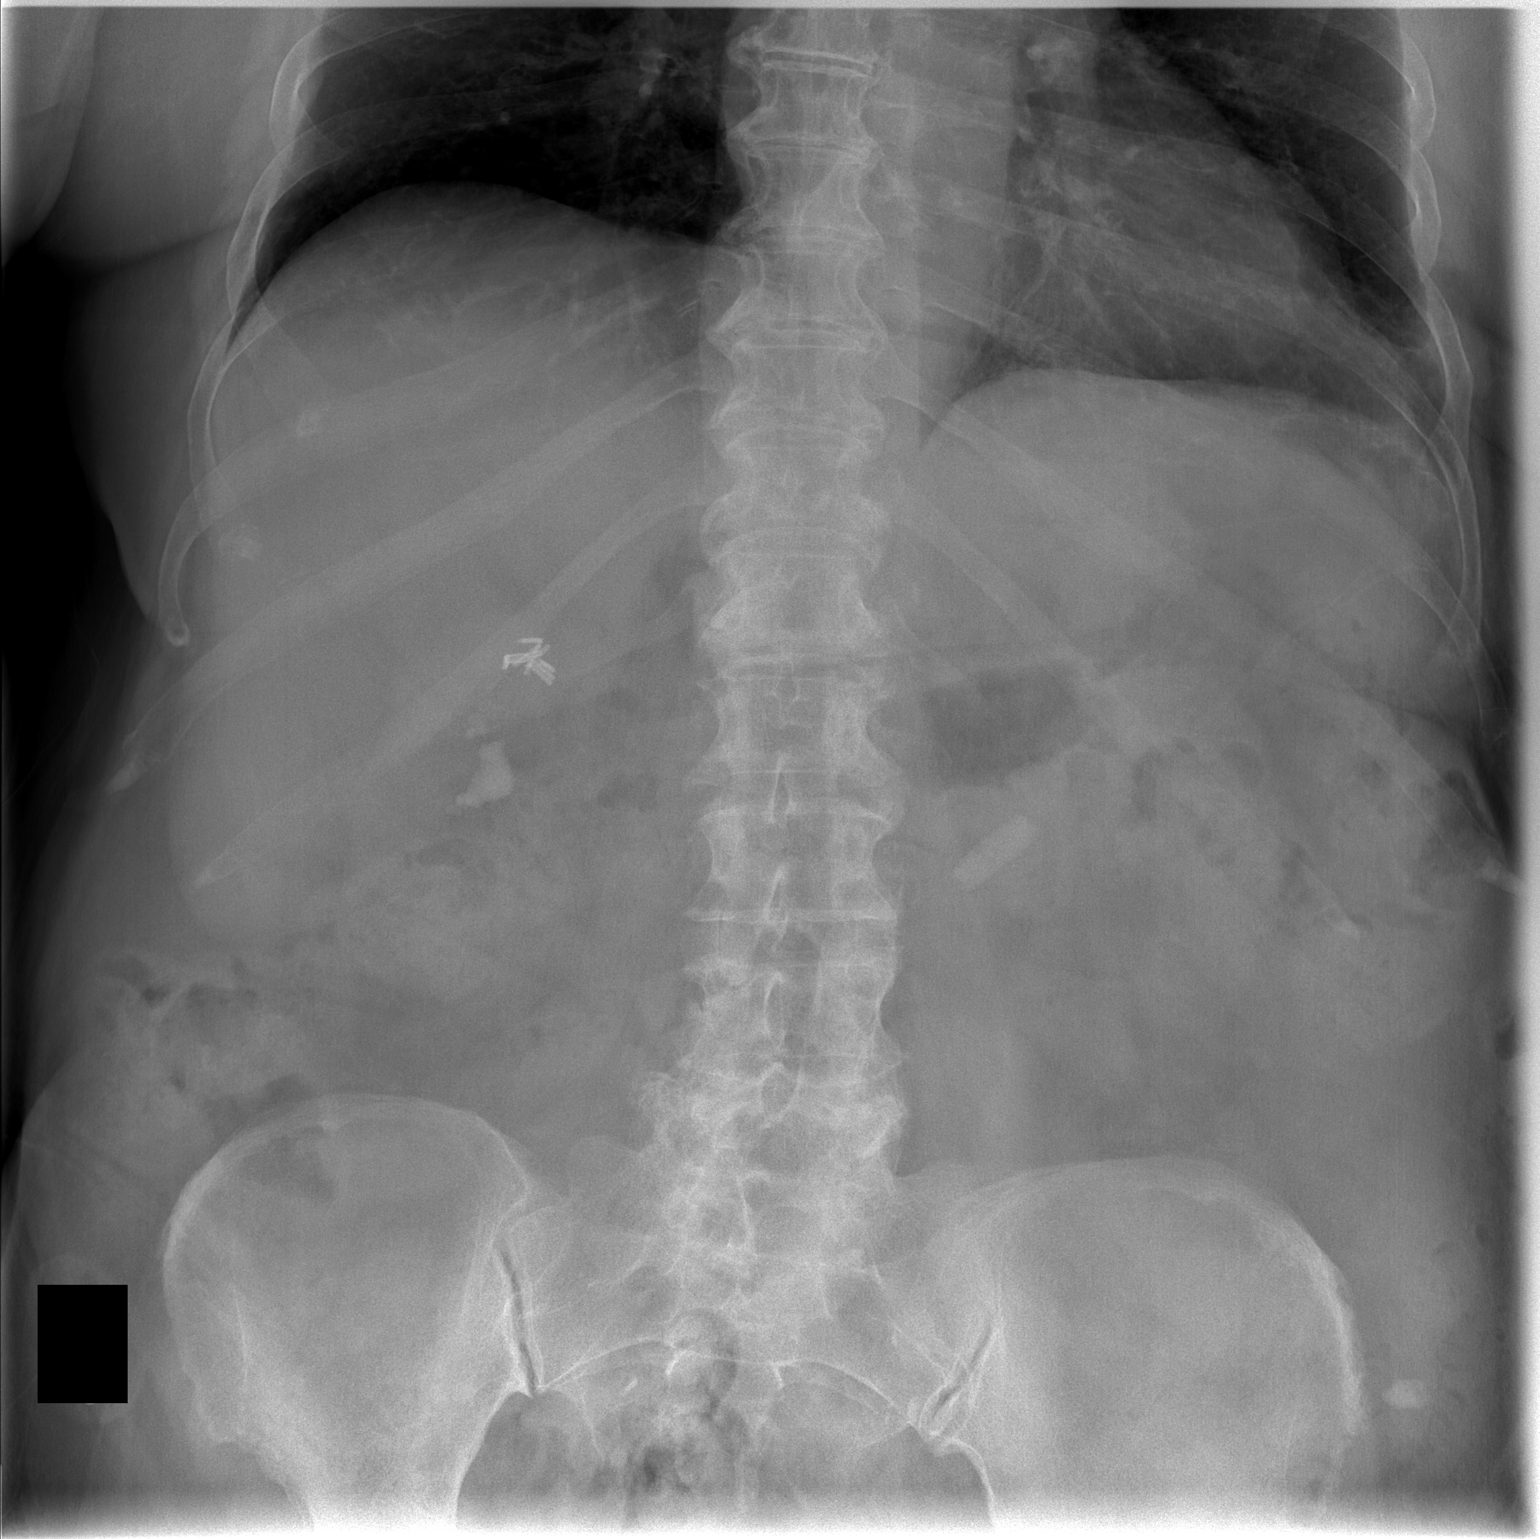

[2 of 2 positions shown; findings below may reference images not displayed]

FINDINGS: The stone in the mid to distal left ureter overlying the left side
of the sacrum appears unchanged in position since the prior CT scan.
Small staghorn calculus in the right kidney is unchanged.

Bowel gas pattern is normal.  No acute bone abnormality.
IMPRESSION: No change in the position of the left ureteral stone. No change in
the right ureteral calculi.

## 2017-04-15 SURGERY — CYSTOURETEROSCOPY, WITH RETROGRADE PYELOGRAM AND STENT INSERTION
Anesthesia: General | Site: Ureter | Laterality: Left

## 2017-04-15 MED ORDER — IOHEXOL 300 MG/ML  SOLN
INTRAMUSCULAR | Status: DC | PRN
  Administered 2017-04-15: 5 mL via URETHRAL

## 2017-04-15 MED ORDER — FENTANYL CITRATE (PF) 100 MCG/2ML IJ SOLN
25.0000 ug | INTRAMUSCULAR | Status: DC | PRN
  Filled 2017-04-15: qty 1

## 2017-04-15 MED ORDER — PROPOFOL 10 MG/ML IV BOLUS
INTRAVENOUS | Status: AC
  Filled 2017-04-15: qty 20

## 2017-04-15 MED ORDER — SODIUM CHLORIDE 0.9 % IR SOLN
Status: DC | PRN
  Administered 2017-04-15: 3000 mL via INTRAVESICAL
  Administered 2017-04-15: 1000 mL via INTRAVESICAL

## 2017-04-15 MED ORDER — LACTATED RINGERS IV SOLN
INTRAVENOUS | Status: DC
  Administered 2017-04-15: 10:00:00 via INTRAVENOUS
  Filled 2017-04-15: qty 1000

## 2017-04-15 MED ORDER — KETOROLAC TROMETHAMINE 30 MG/ML IJ SOLN
INTRAMUSCULAR | Status: AC
  Filled 2017-04-15: qty 1

## 2017-04-15 MED ORDER — FENTANYL CITRATE (PF) 100 MCG/2ML IJ SOLN
INTRAMUSCULAR | Status: DC | PRN
  Administered 2017-04-15: 50 ug via INTRAVENOUS

## 2017-04-15 MED ORDER — CIPROFLOXACIN IN D5W 400 MG/200ML IV SOLN
INTRAVENOUS | Status: AC
  Filled 2017-04-15: qty 200

## 2017-04-15 MED ORDER — PROPOFOL 10 MG/ML IV BOLUS
INTRAVENOUS | Status: DC | PRN
  Administered 2017-04-15: 180 mg via INTRAVENOUS
  Administered 2017-04-15: 20 mg via INTRAVENOUS

## 2017-04-15 MED ORDER — DEXAMETHASONE SODIUM PHOSPHATE 10 MG/ML IJ SOLN
INTRAMUSCULAR | Status: DC | PRN
  Administered 2017-04-15: 5 mg via INTRAVENOUS

## 2017-04-15 MED ORDER — LIDOCAINE 2% (20 MG/ML) 5 ML SYRINGE
INTRAMUSCULAR | Status: DC | PRN
  Administered 2017-04-15: 100 mg via INTRAVENOUS

## 2017-04-15 MED ORDER — CIPROFLOXACIN HCL 500 MG PO TABS: 500.0000 mg | ORAL_TABLET | Freq: Two times a day (BID) | ORAL | 0 refills | Status: DC

## 2017-04-15 MED ORDER — DEXAMETHASONE SODIUM PHOSPHATE 10 MG/ML IJ SOLN
INTRAMUSCULAR | Status: AC
  Filled 2017-04-15: qty 1

## 2017-04-15 MED ORDER — CIPROFLOXACIN IN D5W 400 MG/200ML IV SOLN
400.0000 mg | INTRAVENOUS | Status: AC
  Administered 2017-04-15: 400 mg via INTRAVENOUS
  Filled 2017-04-15: qty 200

## 2017-04-15 MED ORDER — ONDANSETRON HCL 4 MG/2ML IJ SOLN
INTRAMUSCULAR | Status: AC
  Filled 2017-04-15: qty 2

## 2017-04-15 MED ORDER — LIDOCAINE 2% (20 MG/ML) 5 ML SYRINGE
INTRAMUSCULAR | Status: AC
  Filled 2017-04-15: qty 5

## 2017-04-15 MED ORDER — ONDANSETRON HCL 4 MG/2ML IJ SOLN
4.0000 mg | Freq: Once | INTRAMUSCULAR | Status: DC | PRN
  Filled 2017-04-15: qty 2

## 2017-04-15 MED ORDER — FENTANYL CITRATE (PF) 100 MCG/2ML IJ SOLN
INTRAMUSCULAR | Status: AC
  Filled 2017-04-15: qty 2

## 2017-04-15 MED ORDER — KETOROLAC TROMETHAMINE 30 MG/ML IJ SOLN
INTRAMUSCULAR | Status: DC | PRN
  Administered 2017-04-15: 15 mg via INTRAVENOUS

## 2017-04-15 MED ORDER — ONDANSETRON HCL 4 MG/2ML IJ SOLN
INTRAMUSCULAR | Status: DC | PRN
  Administered 2017-04-15: 4 mg via INTRAVENOUS

## 2017-04-15 SURGICAL SUPPLY — 19 items
BAG DRAIN URO-CYSTO SKYTR STRL (DRAIN) ×4 IMPLANT
BAG DRN UROCATH (DRAIN) ×2
BASKET DAKOTA 1.9FR 11X120 (BASKET) ×3 IMPLANT
CLOTH BEACON ORANGE TIMEOUT ST (SAFETY) ×4 IMPLANT
GLOVE BIO SURGEON STRL SZ8 (GLOVE) ×4 IMPLANT
GOWN STRL REUS W/ TWL LRG LVL3 (GOWN DISPOSABLE) ×2 IMPLANT
GOWN STRL REUS W/ TWL XL LVL3 (GOWN DISPOSABLE) ×2 IMPLANT
GOWN STRL REUS W/TWL LRG LVL3 (GOWN DISPOSABLE) ×4
GOWN STRL REUS W/TWL XL LVL3 (GOWN DISPOSABLE) ×4
GUIDEWIRE STR DUAL SENSOR (WIRE) ×4 IMPLANT
IV NS 1000ML (IV SOLUTION) ×4
IV NS 1000ML BAXH (IV SOLUTION) ×1 IMPLANT
IV NS IRRIG 3000ML ARTHROMATIC (IV SOLUTION) ×5 IMPLANT
KIT RM TURNOVER CYSTO AR (KITS) ×4 IMPLANT
MANIFOLD NEPTUNE II (INSTRUMENTS) ×3 IMPLANT
PACK CYSTO (CUSTOM PROCEDURE TRAY) ×4 IMPLANT
SHEATH ACCESS URETERAL 24CM (SHEATH) ×3 IMPLANT
TUBE CONNECTING 12'X1/4 (SUCTIONS) ×1
TUBE CONNECTING 12X1/4 (SUCTIONS) ×2 IMPLANT

## 2017-04-15 NOTE — Discharge Instructions (Signed)

## 2017-04-15 NOTE — Anesthesia Preprocedure Evaluation (Signed)
Anesthesia Evaluation  Patient identified by MRN, date of birth, ID band Patient awake    Reviewed: Allergy & Precautions, NPO status , Patient's Chart, lab work & pertinent test results  Airway Mallampati: II  TM Distance: >3 FB Neck ROM: Full    Dental  (+) Teeth Intact, Dental Advisory Given   Pulmonary sleep apnea and Continuous Positive Airway Pressure Ventilation , COPD,  COPD inhaler, former smoker,    Pulmonary exam normal breath sounds clear to auscultation       Cardiovascular hypertension, Pt. on medications Normal cardiovascular exam+ Valvular Problems/Murmurs AS  Rhythm:Regular Rate:Normal     Neuro/Psych PSYCHIATRIC DISORDERS Depression negative neurological ROS     GI/Hepatic Neg liver ROS, GERD  ,  Endo/Other  diabetes, Type 2, Oral Hypoglycemic AgentsObesity   Renal/GU Nephrolithiasis      Musculoskeletal  (+) Arthritis , Osteoarthritis,    Abdominal   Peds  Hematology  (+) Blood dyscrasia, anemia ,   Anesthesia Other Findings Day of surgery medications reviewed with the patient.  Reproductive/Obstetrics                            Anesthesia Physical Anesthesia Plan  ASA: III  Anesthesia Plan: General   Post-op Pain Management:    Induction: Intravenous  Airway Management Planned: LMA  Additional Equipment:   Intra-op Plan:   Post-operative Plan: Extubation in OR  Informed Consent: I have reviewed the patients History and Physical, chart, labs and discussed the procedure including the risks, benefits and alternatives for the proposed anesthesia with the patient or authorized representative who has indicated his/her understanding and acceptance.   Dental advisory given  Plan Discussed with: CRNA  Anesthesia Plan Comments: (Risks/benefits of general anesthesia discussed with patient including risk of damage to teeth, lips, gum, and tongue, nausea/vomiting,  allergic reactions to medications, and the possibility of heart attack, stroke and death.  All patient questions answered.  Patient wishes to proceed.)        Anesthesia Quick Evaluation

## 2017-04-15 NOTE — Op Note (Signed)
PATIENT:  Lanna T Cowgill  PRE-OPERATIVE DIAGNOSIS:  left Ureteral calculus  POST-OPERATIVE DIAGNOSIS: Same  PROCEDURE:  1. Cystoscopy with left retrograde pyelogram including interpretation 2. Left ureteroscopy and stone extraction 3. Fluoroscopy time less than 1 hour  SURGEON: Claybon Jabs, MD  INDICATION: Ms. Crothers is a 76 year old female who has had multiple stones in the past but she has always been able to pass her stones. She has had a left ureteral stone for some time and despite medical expulsive therapy her stone has failed to progress so she is brought to the operating room for surgical management. She received Cipro 400 mg IV preoperatively.  ANESTHESIA:  General  EBL:  Minimal  DRAINS: None  SPECIMEN:  Stone given to patient  DESCRIPTION OF PROCEDURE: The patient was taken to the major OR and placed on the table. General anesthesia was administered and then the patient was moved to the dorsal lithotomy position. The genitalia was sterilely prepped and draped. An official timeout was performed.  Initially the 87 French cystoscope with 30 lens was passed under direct vision into the bladder. The bladder was then fully inspected. It was noted be free of any tumors, stones or inflammatory lesions. Ureteral orifices were of normal configuration and position. A 6 French open-ended ureteral catheter was then passed through the cystoscope into the ureteral orifice in order to perform a left retrograde pyelogram.  A retrograde pyelogram was performed by injecting full-strength contrast up the left ureter under direct fluoroscopic control. It revealed a filling defect in the mid to distal ureter consistent with the stone seen on the preoperative KUB. The remainder of the ureter was noted to be normal as was the intrarenal collecting system. I then passed a 0.038 inch floppy-tipped guidewire through the open ended catheter and into the area of the renal pelvis and this was left in  place. The inner portion of a ureteral access sheath was then passed over the guidewire to gently dilate the intramural ureter. I then proceeded with ureteroscopy.  A 6 French rigid ureteroscope was then passed under direct into the bladder and into the left orifice and up the ureter. The stone was identified and was noted to be very smooth and therefore I felt extraction would be feasible. I therefore passed a [place] basket through the ureteroscope, engaged the stone and was easily able to advance the stone down the ureter without any significant trauma or holdup within the ureter. The bladder was drained. The patient tolerated the procedure well no intraoperative complications.  PLAN OF CARE: Discharge to home after PACU  PATIENT DISPOSITION:  PACU - hemodynamically stable.

## 2017-04-15 NOTE — Anesthesia Procedure Notes (Signed)
Procedure Name: LMA Insertion Date/Time: 04/15/2017 10:01 AM Performed by: Bethena Roys T Pre-anesthesia Checklist: Patient identified, Emergency Drugs available, Suction available and Patient being monitored Patient Re-evaluated:Patient Re-evaluated prior to inductionOxygen Delivery Method: Circle system utilized Preoxygenation: Pre-oxygenation with 100% oxygen Intubation Type: IV induction Ventilation: Mask ventilation without difficulty LMA: LMA inserted LMA Size: 4.0 Number of attempts: 1 Airway Equipment and Method: Bite block Placement Confirmation: positive ETCO2 Tube secured with: Tape Dental Injury: Teeth and Oropharynx as per pre-operative assessment

## 2017-04-15 NOTE — Transfer of Care (Signed)
Immediate Anesthesia Transfer of Care Note  Patient: [name] [name]  Procedure(s) Performed: Procedure(s): CYSTOSCOPY WITH LEFT  RETROGRADE PYELOGRAM, URETEROSCOPYwith stone basketry (Left)  Patient Location: PACU  Anesthesia Type:General  Level of Consciousness: sedated and responds to stimulation  Airway & Oxygen Therapy: Patient Spontanous Breathing and Patient connected to nasal cannula oxygen  Post-op Assessment: Report given to RN  Post vital signs: Reviewed and stable  Last Vitals:  Vitals:   04/15/17 0900 04/15/17 1031  BP: 136/76 120/78  Pulse: 92 87  Resp: 19 (!) 7  Temp: 37.2 C 36.9 C    Last Pain:  Vitals:   04/15/17 0915  TempSrc:   PainSc: 5       Patients Stated Pain Goal: 9 (51/70/01 [id number])  Complications: No apparent anesthesia complications

## 2017-04-15 NOTE — Anesthesia Postprocedure Evaluation (Signed)
Anesthesia Post Note  Patient: Pluma T Zeiders  Procedure(s) Performed: Procedure(s) (LRB): CYSTOSCOPY WITH LEFT  RETROGRADE PYELOGRAM, URETEROSCOPYwith stone basketry (Left)  Patient location during evaluation: PACU Anesthesia Type: General Level of consciousness: awake and alert Pain management: pain level controlled Vital Signs Assessment: post-procedure vital signs reviewed and stable Respiratory status: spontaneous breathing, nonlabored ventilation, respiratory function stable and patient connected to nasal cannula oxygen Cardiovascular status: blood pressure returned to baseline and stable Postop Assessment: no signs of nausea or vomiting Anesthetic complications: no       Last Vitals:  Vitals:   04/15/17 1130 04/15/17 1215  BP: 114/68 139/78  Pulse: 80 89  Resp: 18 16  Temp:  37.3 C    Last Pain:  Vitals:   04/15/17 0915  TempSrc:   PainSc: [place]

## 2017-04-16 ENCOUNTER — Encounter (HOSPITAL_BASED_OUTPATIENT_CLINIC_OR_DEPARTMENT_OTHER): Payer: Self-pay | Admitting: Urology

## 2017-04-19 DIAGNOSIS — I10 Essential (primary) hypertension: Secondary | ICD-10-CM | POA: Diagnosis not present

## 2017-04-19 DIAGNOSIS — D649 Anemia, unspecified: Secondary | ICD-10-CM | POA: Diagnosis not present

## 2017-04-19 DIAGNOSIS — E1129 Type 2 diabetes mellitus with other diabetic kidney complication: Secondary | ICD-10-CM | POA: Diagnosis not present

## 2017-04-19 DIAGNOSIS — E1149 Type 2 diabetes mellitus with other diabetic neurological complication: Secondary | ICD-10-CM | POA: Diagnosis not present

## 2017-04-19 DIAGNOSIS — Z6831 Body mass index (BMI) 31.0-31.9, adult: Secondary | ICD-10-CM | POA: Diagnosis not present

## 2017-04-19 DIAGNOSIS — E784 Other hyperlipidemia: Secondary | ICD-10-CM | POA: Diagnosis not present

## 2017-04-19 DIAGNOSIS — K317 Polyp of stomach and duodenum: Secondary | ICD-10-CM | POA: Diagnosis not present

## 2017-04-19 DIAGNOSIS — N200 Calculus of kidney: Secondary | ICD-10-CM | POA: Diagnosis not present

## 2017-04-19 DIAGNOSIS — Z1389 Encounter for screening for other disorder: Secondary | ICD-10-CM | POA: Diagnosis not present

## 2017-06-10 DIAGNOSIS — L281 Prurigo nodularis: Secondary | ICD-10-CM | POA: Diagnosis not present

## 2017-06-10 DIAGNOSIS — D485 Neoplasm of uncertain behavior of skin: Secondary | ICD-10-CM | POA: Diagnosis not present

## 2017-06-10 DIAGNOSIS — C44729 Squamous cell carcinoma of skin of left lower limb, including hip: Secondary | ICD-10-CM | POA: Diagnosis not present

## 2017-06-10 DIAGNOSIS — Z85828 Personal history of other malignant neoplasm of skin: Secondary | ICD-10-CM | POA: Diagnosis not present

## 2017-06-10 DIAGNOSIS — C44629 Squamous cell carcinoma of skin of left upper limb, including shoulder: Secondary | ICD-10-CM | POA: Diagnosis not present

## 2017-06-10 DIAGNOSIS — C44529 Squamous cell carcinoma of skin of other part of trunk: Secondary | ICD-10-CM | POA: Diagnosis not present

## 2017-06-10 DIAGNOSIS — L565 Disseminated superficial actinic porokeratosis (DSAP): Secondary | ICD-10-CM | POA: Diagnosis not present

## 2017-06-10 DIAGNOSIS — L218 Other seborrheic dermatitis: Secondary | ICD-10-CM | POA: Diagnosis not present

## 2017-06-10 DIAGNOSIS — L821 Other seborrheic keratosis: Secondary | ICD-10-CM | POA: Diagnosis not present

## 2017-07-04 ENCOUNTER — Other Ambulatory Visit: Payer: Self-pay | Admitting: Surgery

## 2017-07-04 DIAGNOSIS — E042 Nontoxic multinodular goiter: Secondary | ICD-10-CM

## 2017-07-16 ENCOUNTER — Ambulatory Visit
Admission: RE | Admit: 2017-07-16 | Discharge: 2017-07-16 | Disposition: A | Discharge status: Home / Self Care | Payer: Medicare Other | Source: Ambulatory Visit | Attending: Surgery | Admitting: Surgery

## 2017-07-16 DIAGNOSIS — E042 Nontoxic multinodular goiter: Secondary | ICD-10-CM

## 2017-07-19 DIAGNOSIS — D440 Neoplasm of uncertain behavior of thyroid gland: Secondary | ICD-10-CM | POA: Diagnosis not present

## 2017-07-19 DIAGNOSIS — E042 Nontoxic multinodular goiter: Secondary | ICD-10-CM | POA: Diagnosis not present

## 2017-07-23 ENCOUNTER — Other Ambulatory Visit: Payer: Self-pay | Admitting: Surgery

## 2017-07-23 DIAGNOSIS — E042 Nontoxic multinodular goiter: Secondary | ICD-10-CM

## 2017-08-13 ENCOUNTER — Ambulatory Visit
Admission: RE | Admit: 2017-08-13 | Discharge: 2017-08-13 | Disposition: A | Discharge status: Home / Self Care | Payer: Medicare Other | Source: Ambulatory Visit | Attending: Surgery | Admitting: Surgery

## 2017-08-13 ENCOUNTER — Other Ambulatory Visit (HOSPITAL_COMMUNITY)
Admission: RE | Admit: 2017-08-13 | Discharge: 2017-08-13 | Disposition: A | Discharge status: Home / Self Care | Payer: Medicare Other | Source: Ambulatory Visit | Attending: Radiology | Admitting: Radiology

## 2017-08-13 DIAGNOSIS — E042 Nontoxic multinodular goiter: Secondary | ICD-10-CM | POA: Insufficient documentation

## 2017-08-13 DIAGNOSIS — E0789 Other specified disorders of thyroid: Secondary | ICD-10-CM | POA: Diagnosis not present

## 2017-08-30 DIAGNOSIS — N183 Chronic kidney disease, stage 3 (moderate): Secondary | ICD-10-CM | POA: Diagnosis not present

## 2017-08-30 DIAGNOSIS — E1129 Type 2 diabetes mellitus with other diabetic kidney complication: Secondary | ICD-10-CM | POA: Diagnosis not present

## 2017-08-30 DIAGNOSIS — E784 Other hyperlipidemia: Secondary | ICD-10-CM | POA: Diagnosis not present

## 2017-08-30 DIAGNOSIS — Z6830 Body mass index (BMI) 30.0-30.9, adult: Secondary | ICD-10-CM | POA: Diagnosis not present

## 2017-08-30 DIAGNOSIS — Z23 Encounter for immunization: Secondary | ICD-10-CM | POA: Diagnosis not present

## 2017-08-30 DIAGNOSIS — D649 Anemia, unspecified: Secondary | ICD-10-CM | POA: Diagnosis not present

## 2017-08-30 DIAGNOSIS — I10 Essential (primary) hypertension: Secondary | ICD-10-CM | POA: Diagnosis not present

## 2017-08-30 DIAGNOSIS — E041 Nontoxic single thyroid nodule: Secondary | ICD-10-CM | POA: Diagnosis not present

## 2017-09-11 IMAGING — US US THYROID
1 series · 12 of 25 positions shown · non-contrast
Comparison: [DATE]

CLINICAL DATA: Multinodular thyroid

EXAM:
THYROID ULTRASOUND
TECHNIQUE: Ultrasound examination of the thyroid gland and adjacent soft
tissues was performed.

[Series 1: us thyroid · 0.07mm/px · 12 of 41 slices shown]
[im 2/41]
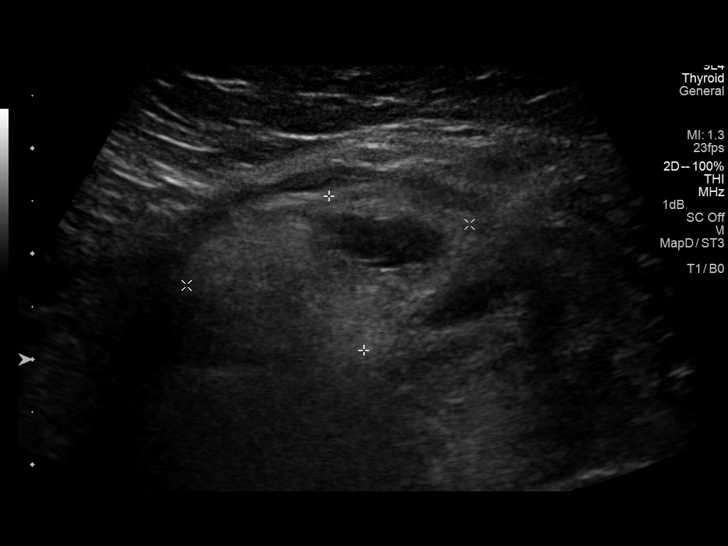
[im 6/41]
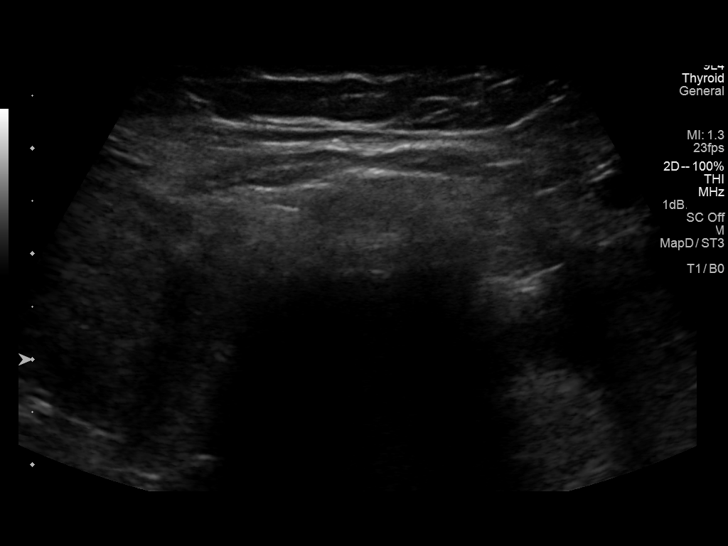
[im 9/41]
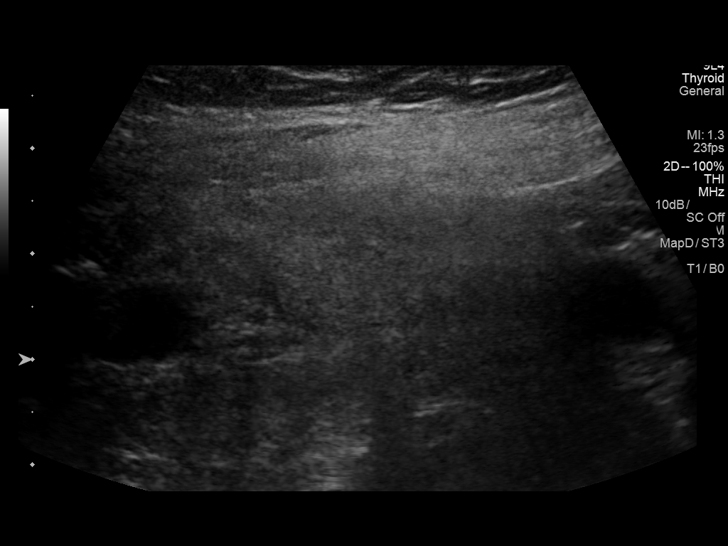
[im 12/41]
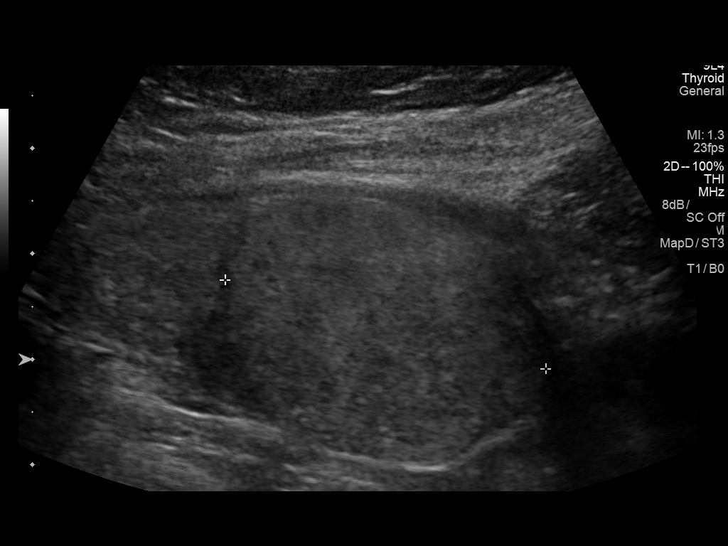
[im 16/41]
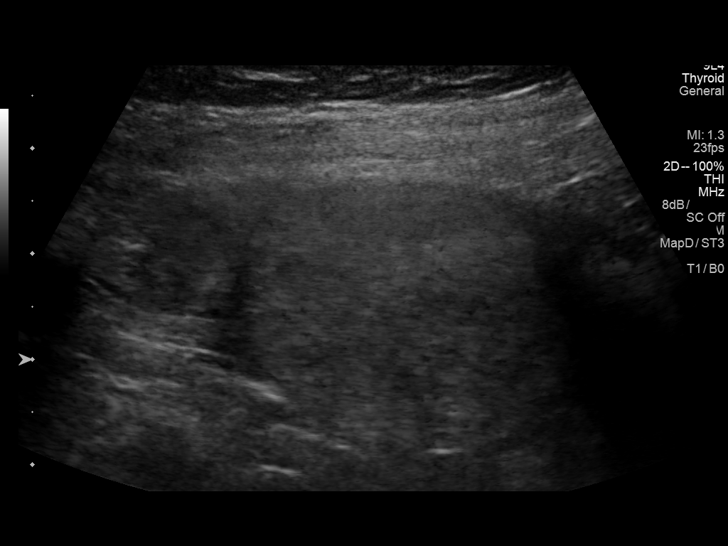
[im 19/41]
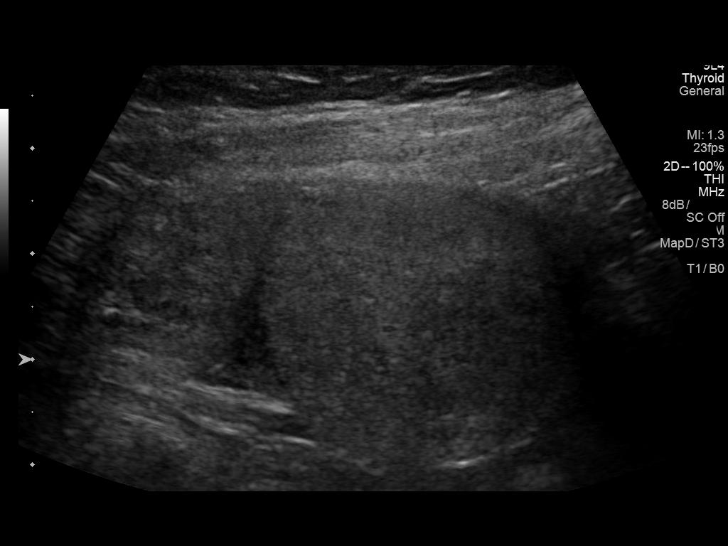
[im 22/41]
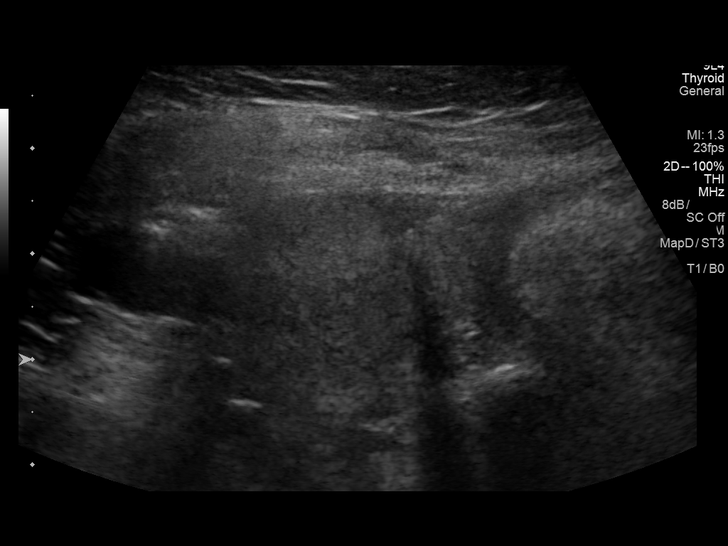
[im 26/41]
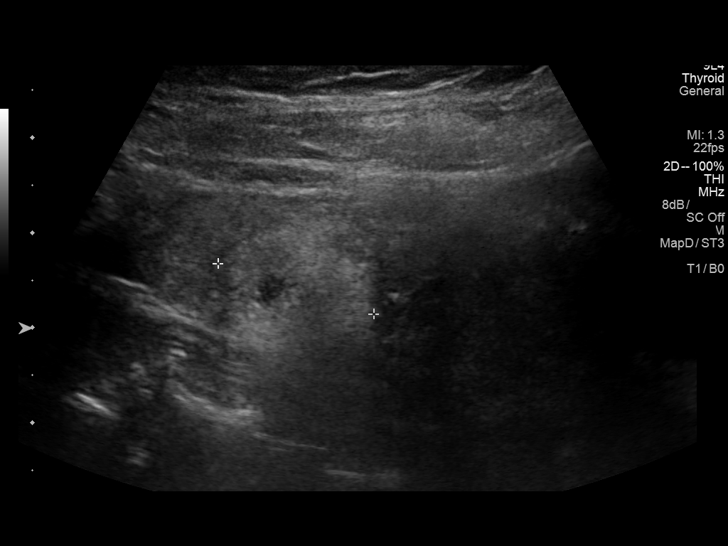
[im 29/41]
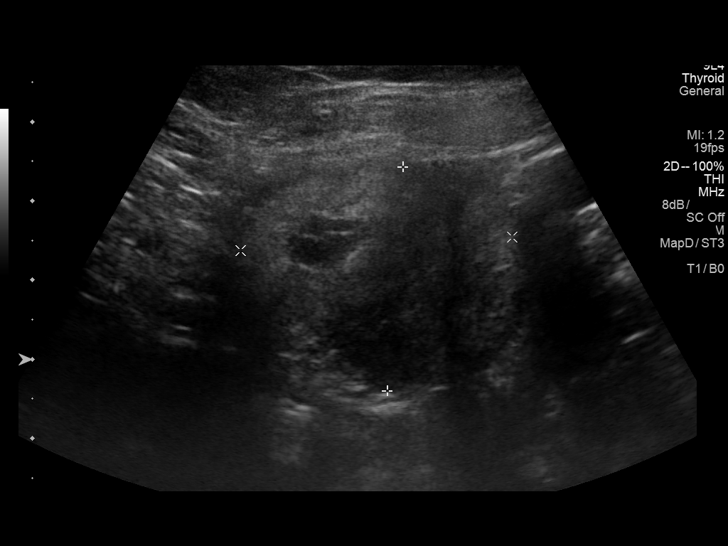
[im 32/41]
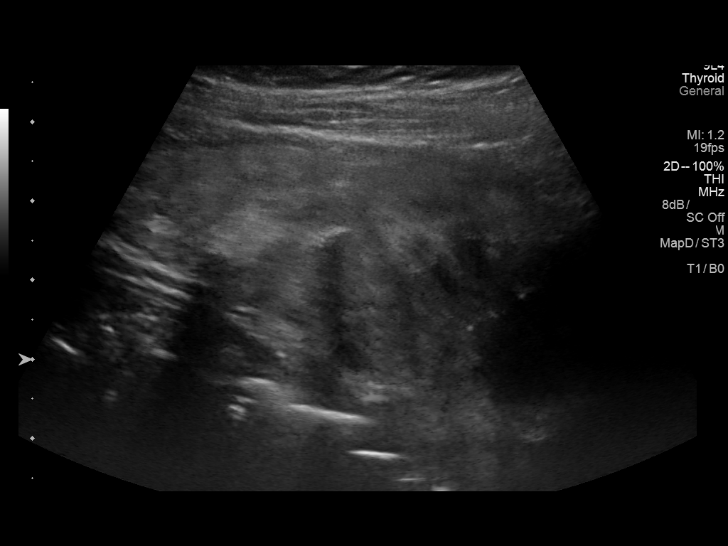
[im 36/41]
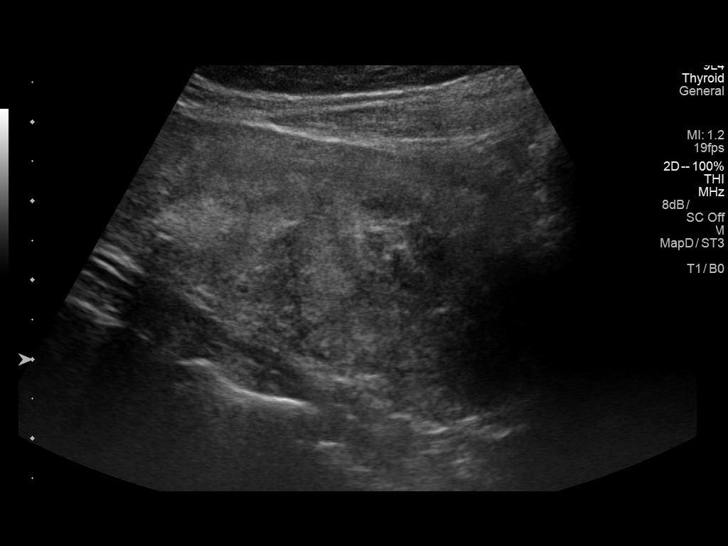
[im 39/41]
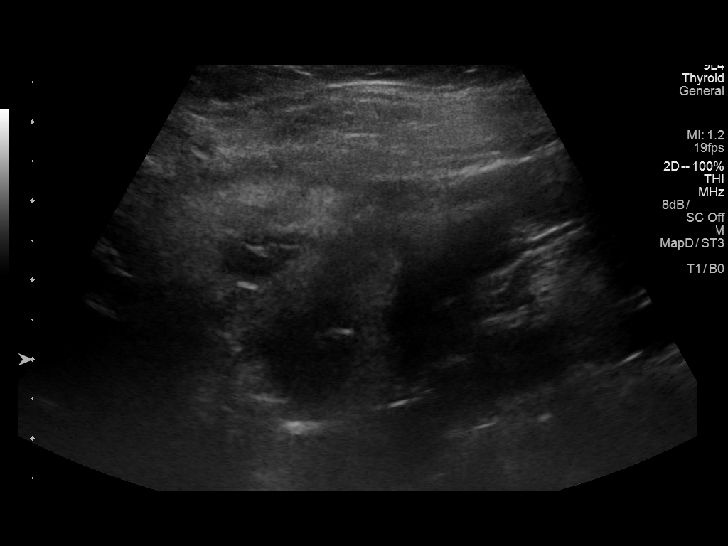

[12 of 25 positions shown; findings below may reference images not displayed]

FINDINGS: Parenchymal Echotexture: Moderately heterogenous

Isthmus: 1.1 cm, unchanged

Right lobe: 5.3 x 2.7 x 3.2 cm, previously 5.6 x 2.6 x 2.9 cm

Left lobe: 6.2 x 2.7 x 3.0 cm, previously 6.6 x 3.1 x 3.1 cm

_________________________________________________________

Estimated total number of nodules >/= 1 cm: 5

Number of spongiform nodules >/=  2 cm not described below (TR1): 0

Number of mixed cystic and solid nodules >/= 1.5 cm not described
below (TR2): 0

_________________________________________________________

Nodule # 1:

Location: Isthmus; Mid

Maximum size: 2.7, previously 2.6 cm; Other 2 dimensions: 1.5 x 2.6,
previously 1.7 x 2.6 Cm

Composition: solid/almost completely solid (2)

Echogenicity: isoechoic (1)

Shape: not taller-than-wide (0)

Margins: ill-defined (0)

Echogenic foci: none (0)

ACR TI-RADS total points: 3.

ACR TI-RADS risk category: TR3 (3 points).

ACR TI-RADS recommendations:

**Given size (>/= 2.5 cm) and appearance, fine needle aspiration of
this mildly suspicious nodule should be considered based on TI-RADS
criteria.

_________________________________________________________

Nodule # 2, labeled 3 on the worksheet:

Prior biopsy: Yes

Location: Right; Inferior

Maximum size: 3.1, previously 3.2 cm; Other 2 dimensions: 2.5 x
cm, previously, 2.3 x 2.4 cm

Composition: solid/almost completely solid (2)

Echogenicity: isoechoic (1)

Shape: taller-than-wide (3)

Margins: smooth (0)

Echogenic foci: none (0)

ACR TI-RADS total points: 6.

ACR TI-RADS risk category:  TR4 (4-6 points).

Significant change in size (>/= 20% in two dimensions and minimal
increase of 2 mm): No

Change in features: No

Change in ACR TI-RADS risk category: No

ACR TI-RADS recommendations:

**Given size (>/= 1.5 cm) and appearance, fine needle aspiration of
this moderately suspicious nodule should be considered based on
TI-RADS criteria. Please note biopsy was performed [DATE] and
[DATE]. Correlate with prior pathology.

_________________________________________________________

Nodule # 3, labeled 4 on the worksheet:

Location: Left; Superior

Maximum size: 1.7, previously 1.7 cm; Other 2 dimensions: 1.5 x 1.4,
previously 1.5 x 1.3 cm

Composition: solid/almost completely solid (2)

Echogenicity: hyperechoic (1)

Shape: taller-than-wide (3)

Margins: lobulated/irregular (2)

Echogenic foci: none (0)

ACR TI-RADS total points: 8.

ACR TI-RADS risk category: TR5 (>/= 7 points).

ACR TI-RADS recommendations:

**Given size (>/= 1.0 cm) and appearance, fine needle aspiration of
this highly suspicious nodule should be considered based on TI-RADS
criteria.

_________________________________________________________

Nodule # 4, labeled 5 on the worksheet:

Location: Left; Inferior

Maximum size: 3.6, previously 3.4 cm; Other 2 dimensions: 2.8 x 3.4,
previously 2.8 x 2.7 cm

Composition: mixed cystic and solid (1)

Echogenicity: isoechoic (1)

Shape: not taller-than-wide (0)

Margins: ill-defined (0)

Echogenic foci: none (0)

ACR TI-RADS total points: 2.

ACR TI-RADS risk category: TR2 (2 points).

ACR TI-RADS recommendations:

This nodule does NOT meet TI-RADS criteria for biopsy or dedicated
follow-up.

_________________________________________________________

Dominant 4 nodules are described above. Additional right upper pole
1.3 cm thyroid nodule is grossly stable in appearance and not fully
characterized by TI rads criteria.
IMPRESSION: 2.7 cm isthmus TR 3 nodule meets criteria for biopsy as above.

3.1 cm right inferior TR 4 nodule was previously biopsied in [[facility]].
Correlate with prior pathology.

1.7 cm left superior TR 5 nodule meets criteria for biopsy as above.

3.6 cm left inferior TR 2 nodule does not meet criteria for biopsy
or additional follow-up.

The above is in keeping with the ACR TI-RADS recommendations - [HOSPITAL] [KF];[DATE].

## 2017-10-09 IMAGING — US US THYROID BIOPSY
1 series · 13 of 25 positions shown · non-contrast
Comparison: Thyroid ultrasound dated [DATE]

MEDICATIONS:
None

COMPLICATIONS:
None immediate.

INDICATION: Patient with history of multinodular thyroid and prior benign
biopsies of right mid thyroid nodule in [4Q]. Latest thyroid
ultrasound on [DATE] reveals multiple nodules again with 2.7 cm
isthmus and 1.7 cm left upper pole nodules which meet criteria for
biopsy. She presents again today for needle aspirate biopsies of the
above mentioned nodules.

EXAM:
ULTRASOUND GUIDED FINE NEEDLE ASPIRATION BIOPSIES OF ISTHMUS AND
LEFT UPPER POLE THYROID NODULES
TECHNIQUE: Informed written consent was obtained from the patient after a
discussion of the risks, benefits and alternatives to treatment.
Questions regarding the procedure were encouraged and answered. A
timeout was performed prior to the initiation of the procedure.

[Series 1: us thyroid biopsy · 0.07mm/px · 31 acquisitions, 13 frames shown]
[im 1/31]
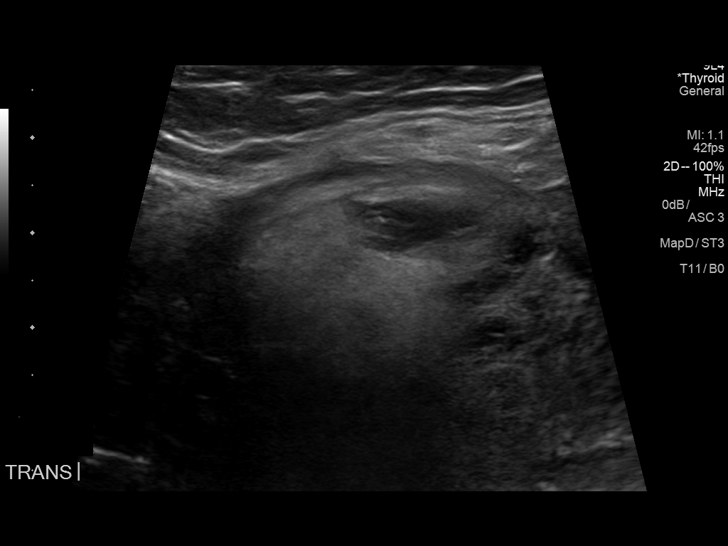
[im 3/31]
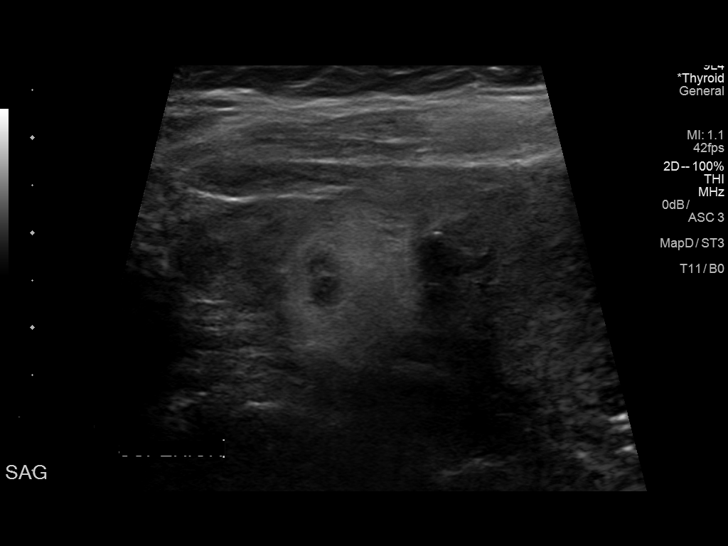
[im 6/31]
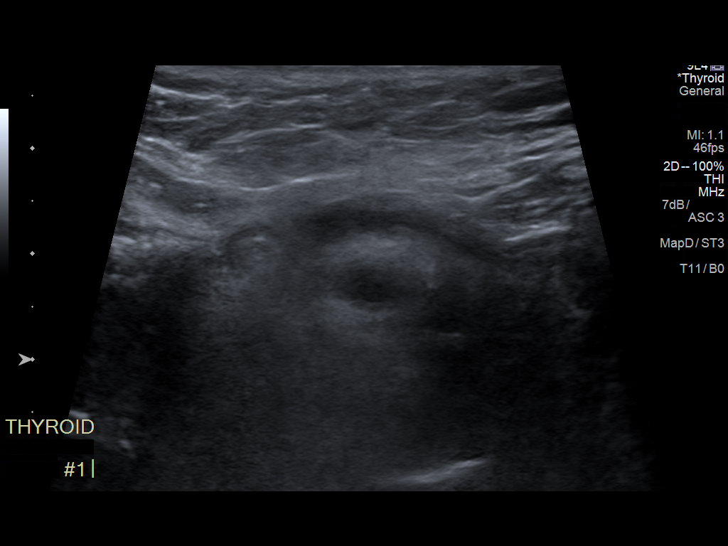
[im 8/31]
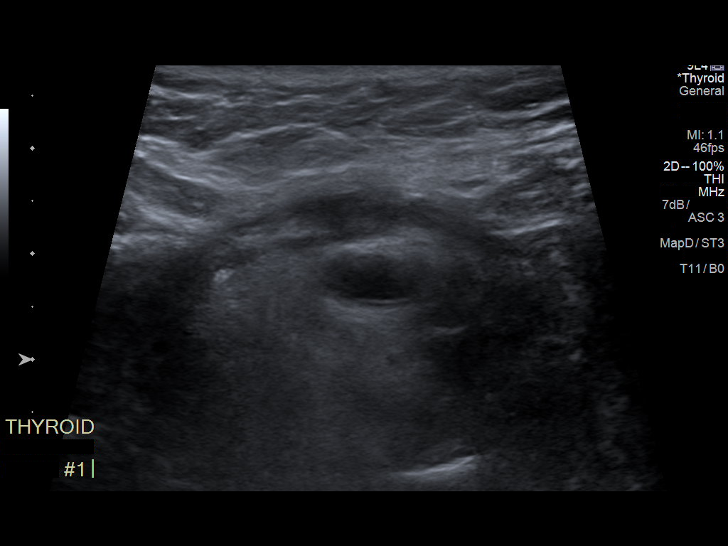
[im 11/31]
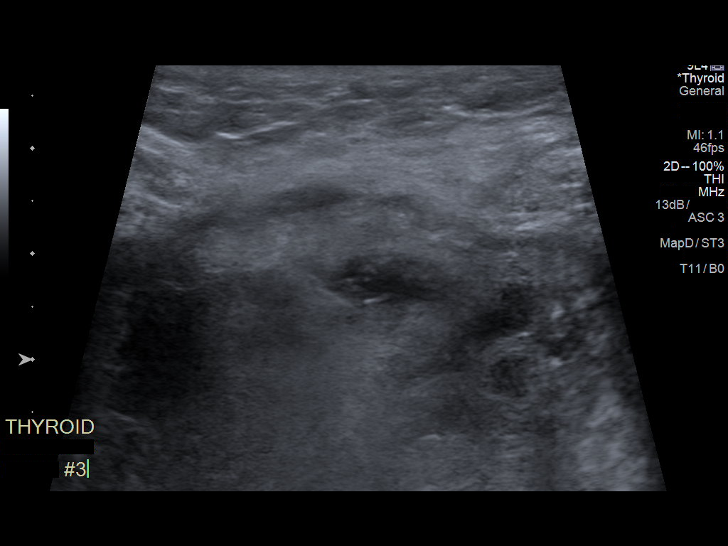
[im 13/31]
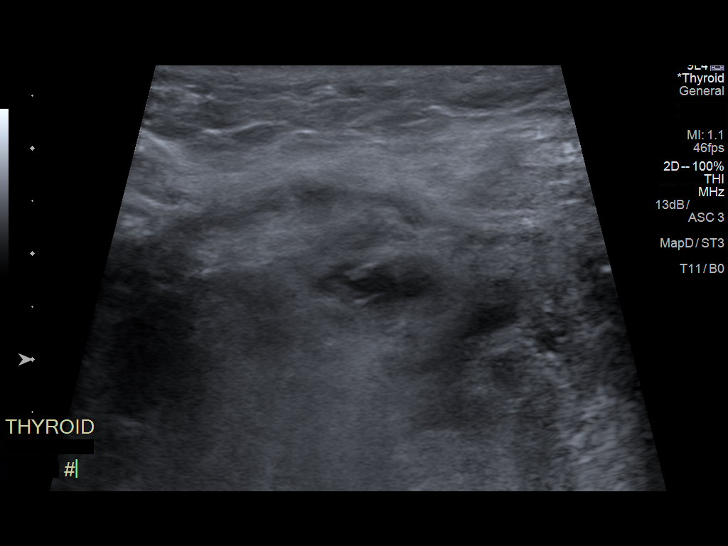
[im 16/31]
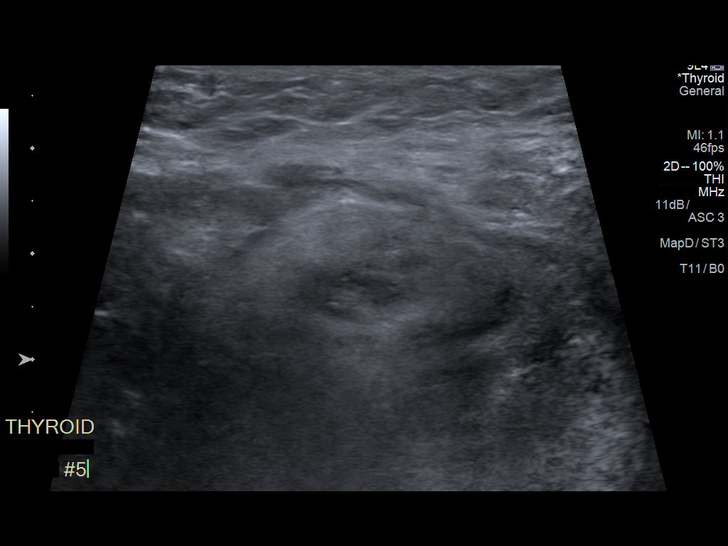
[im 18/31]
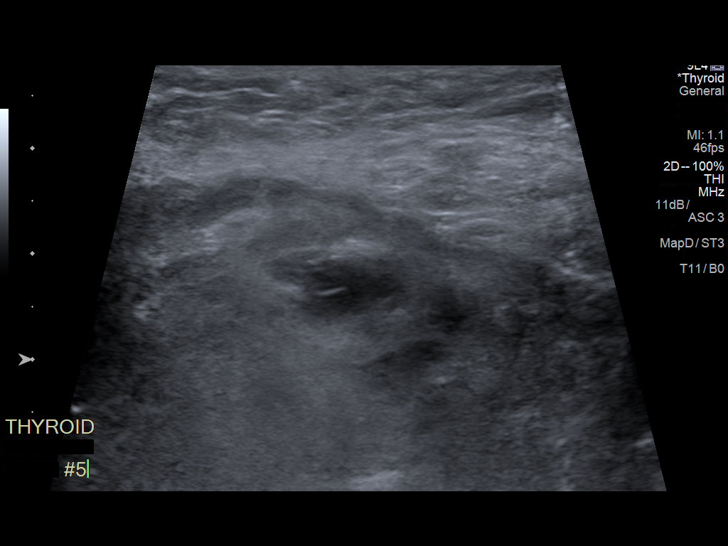
[im 21/31]
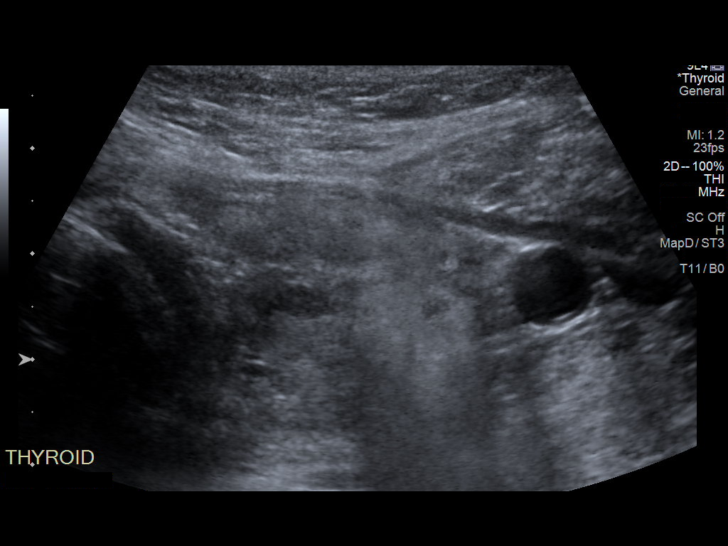
[im 23/31]
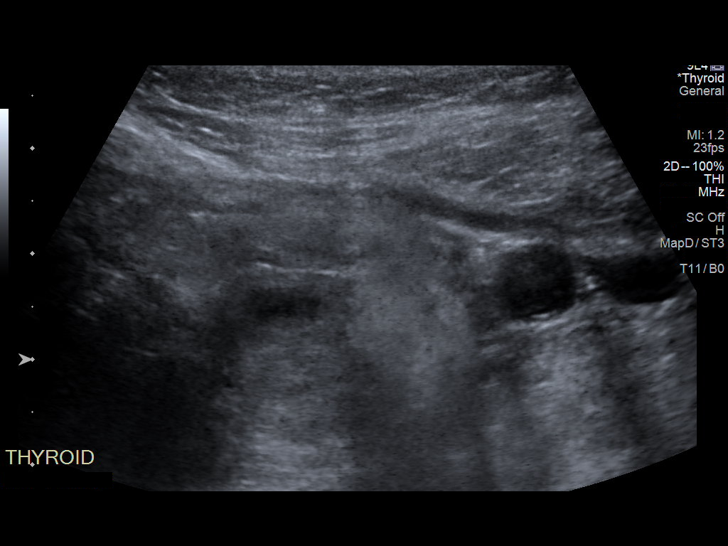
[im 26/31]
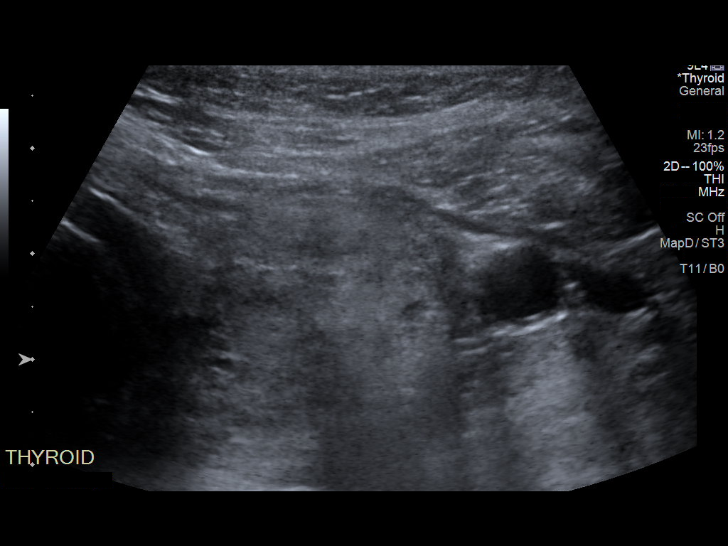
[im 28/31]
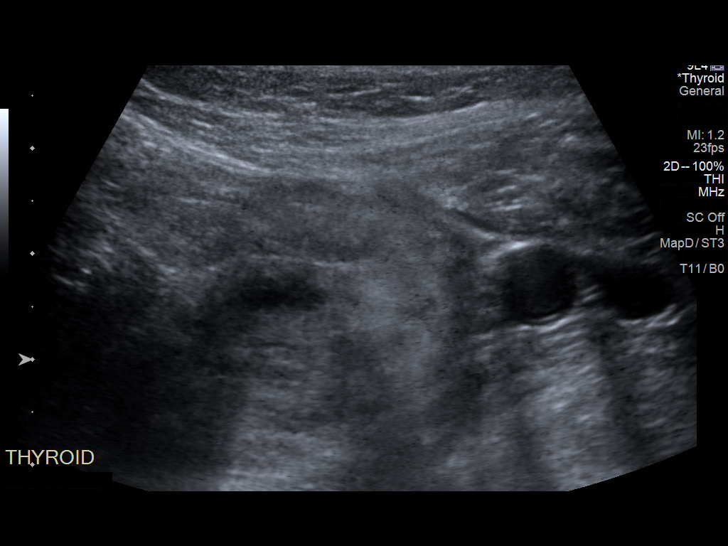
[im 31/31]
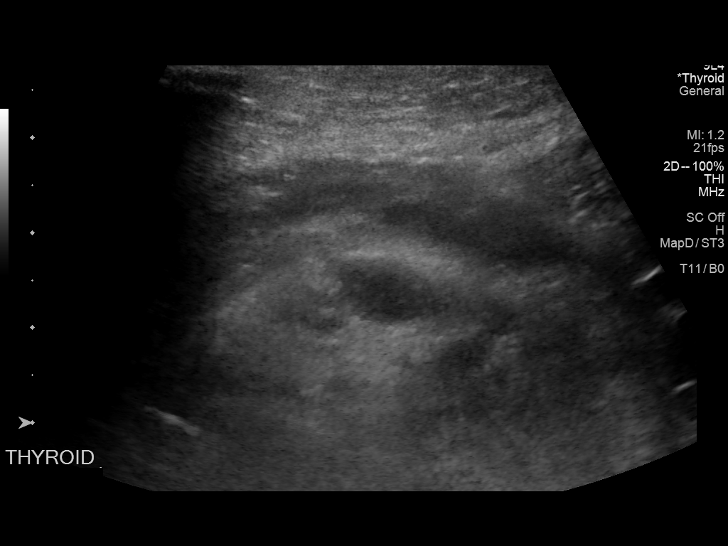

[13 of 25 positions shown; findings below may reference images not displayed]

Pre-procedural ultrasound scanning demonstrated unchanged size and
appearance of the indeterminate nodules within the isthmus and left
upper pole thyroid regions

The procedure was planned. The neck was prepped in the usual sterile
fashion, and a sterile drape was applied covering the operative
field. A timeout was performed prior to the initiation of the
procedure. Local anesthesia was provided with 1% lidocaine.

Under direct ultrasound guidance, 5 FNA biopsies were performed of
the isthmus thyroid nodule with 25 gauge needles. Multiple
ultrasound images were saved for procedural documentation purposes.
The samples were prepared and submitted to pathology as well as for
Afirma.

Under direct ultrasound guidance, 5 FNA biopsies were performed of
the left upper pole thyroid nodule with 25 gauge needles. Multiple
ultrasound images were saved for procedural documentation purposes.
The samples were prepared and submitted to pathology as well as for
Afirma.

Limited post procedural scanning was negative for hematoma or
additional complication. Dressings were placed. The patient
tolerated the above procedures procedure well without immediate
postprocedural complication.
FINDINGS: Nodule reference number based on prior diagnostic ultrasound: 1

Maximum size:  2.7 cm

Location: Isthmus; Mid

ACR TI-RADS risk category: TR3 (3 points)

Reason for biopsy: meets ACR TI-RADS criteria

_________________________________________________________

Nodule reference number based on prior diagnostic ultrasound: 3

Maximum size:  1.7 cm

Location: Left; Superior

ACR TI-RADS risk category: TR5 (>/= 7 points)

Reason for biopsy: meets ACR TI-RADS criteria

Ultrasound imaging confirms appropriate placement of the needles
within the thyroid nodule.
IMPRESSION: 1. Technically successful ultrasound guided fine needle aspiration
biopsy of mid isthmus thyroid nodule
2. Technically successful ultrasound guided fine needle aspiration
biopsy of left upper pole thyroid nodule . Final pathology pending.

## 2017-10-17 DIAGNOSIS — L308 Other specified dermatitis: Secondary | ICD-10-CM | POA: Diagnosis not present

## 2017-10-17 DIAGNOSIS — C44722 Squamous cell carcinoma of skin of right lower limb, including hip: Secondary | ICD-10-CM | POA: Diagnosis not present

## 2017-10-17 DIAGNOSIS — L820 Inflamed seborrheic keratosis: Secondary | ICD-10-CM | POA: Diagnosis not present

## 2017-12-02 DIAGNOSIS — Z85828 Personal history of other malignant neoplasm of skin: Secondary | ICD-10-CM | POA: Diagnosis not present

## 2017-12-02 DIAGNOSIS — L02425 Furuncle of right lower limb: Secondary | ICD-10-CM | POA: Diagnosis not present

## 2017-12-02 DIAGNOSIS — Z08 Encounter for follow-up examination after completed treatment for malignant neoplasm: Secondary | ICD-10-CM | POA: Diagnosis not present

## 2017-12-02 DIAGNOSIS — Z0189 Encounter for other specified special examinations: Secondary | ICD-10-CM | POA: Diagnosis not present

## 2017-12-02 DIAGNOSIS — B9689 Other specified bacterial agents as the cause of diseases classified elsewhere: Secondary | ICD-10-CM | POA: Diagnosis not present

## 2017-12-16 DIAGNOSIS — Z6829 Body mass index (BMI) 29.0-29.9, adult: Secondary | ICD-10-CM | POA: Diagnosis not present

## 2017-12-16 DIAGNOSIS — E1129 Type 2 diabetes mellitus with other diabetic kidney complication: Secondary | ICD-10-CM | POA: Diagnosis not present

## 2017-12-16 DIAGNOSIS — E1149 Type 2 diabetes mellitus with other diabetic neurological complication: Secondary | ICD-10-CM | POA: Diagnosis not present

## 2017-12-16 DIAGNOSIS — E041 Nontoxic single thyroid nodule: Secondary | ICD-10-CM | POA: Diagnosis not present

## 2017-12-16 DIAGNOSIS — R82998 Other abnormal findings in urine: Secondary | ICD-10-CM | POA: Diagnosis not present

## 2017-12-16 DIAGNOSIS — Z1389 Encounter for screening for other disorder: Secondary | ICD-10-CM | POA: Diagnosis not present

## 2017-12-16 DIAGNOSIS — E7849 Other hyperlipidemia: Secondary | ICD-10-CM | POA: Diagnosis not present

## 2017-12-16 DIAGNOSIS — G4733 Obstructive sleep apnea (adult) (pediatric): Secondary | ICD-10-CM | POA: Diagnosis not present

## 2017-12-16 DIAGNOSIS — J449 Chronic obstructive pulmonary disease, unspecified: Secondary | ICD-10-CM | POA: Diagnosis not present

## 2017-12-16 DIAGNOSIS — I10 Essential (primary) hypertension: Secondary | ICD-10-CM | POA: Diagnosis not present

## 2017-12-16 DIAGNOSIS — D508 Other iron deficiency anemias: Secondary | ICD-10-CM | POA: Diagnosis not present

## 2017-12-16 DIAGNOSIS — Z0000 Encounter for general adult medical examination without abnormal findings: Secondary | ICD-10-CM | POA: Diagnosis not present

## 2017-12-16 DIAGNOSIS — N183 Chronic kidney disease, stage 3 (moderate): Secondary | ICD-10-CM | POA: Diagnosis not present

## 2017-12-17 DIAGNOSIS — D509 Iron deficiency anemia, unspecified: Secondary | ICD-10-CM | POA: Diagnosis not present

## 2017-12-17 DIAGNOSIS — D508 Other iron deficiency anemias: Secondary | ICD-10-CM | POA: Diagnosis not present

## 2017-12-18 ENCOUNTER — Other Ambulatory Visit: Payer: Self-pay | Admitting: Internal Medicine

## 2017-12-18 DIAGNOSIS — F17209 Nicotine dependence, unspecified, with unspecified nicotine-induced disorders: Secondary | ICD-10-CM

## 2017-12-27 ENCOUNTER — Ambulatory Visit
Admission: RE | Admit: 2017-12-27 | Discharge: 2017-12-27 | Disposition: A | Discharge status: Home / Self Care | Payer: Medicare Other | Source: Ambulatory Visit | Attending: Internal Medicine | Admitting: Internal Medicine

## 2017-12-27 DIAGNOSIS — Z87891 Personal history of nicotine dependence: Secondary | ICD-10-CM | POA: Diagnosis not present

## 2017-12-27 DIAGNOSIS — F17209 Nicotine dependence, unspecified, with unspecified nicotine-induced disorders: Secondary | ICD-10-CM

## 2017-12-27 IMAGING — CT CT CHEST LUNG CANCER SCREENING LOW DOSE W/O CM
1 of 5 series · 15 of 40 positions shown, 19 images · non-contrast
Comparison: [DATE] diagnostic CT.  No prior screening CT.

CLINICAL DATA: Ex-smoker, quitting 1 month ago. Forty pack-year
history. History skin cancer.

EXAM:
CT CHEST WITHOUT CONTRAST LOW-DOSE FOR LUNG CANCER SCREENING
TECHNIQUE: Multidetector CT imaging of the chest was performed following the
standard protocol without IV contrast.

[Series 3: lung windows · axial · 0.84mm/px · z∈[-306,-40]mm · 15 of 237 slices shown, 19 images]
[im 12/237  mediastinal]
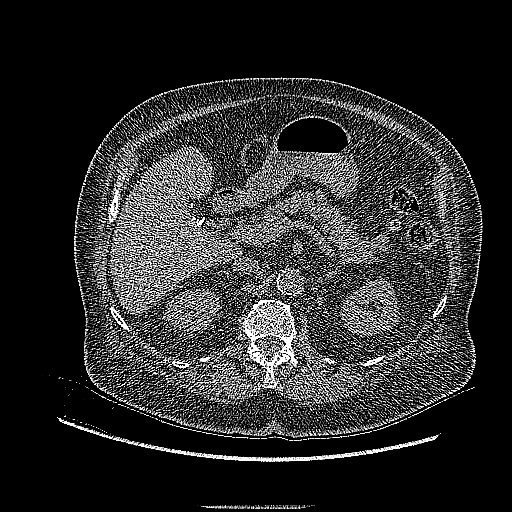
[im 12/237  lung]
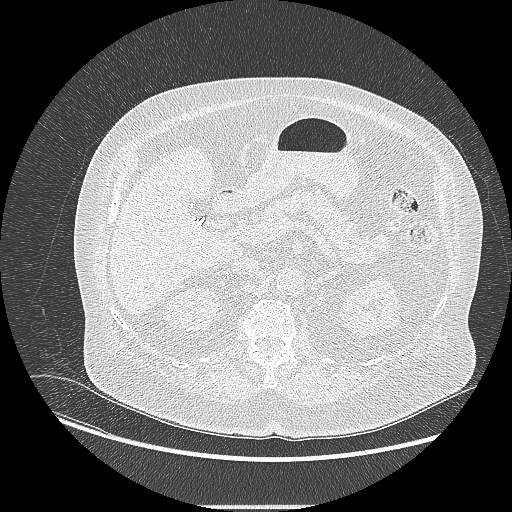
[im 34/237  lung]
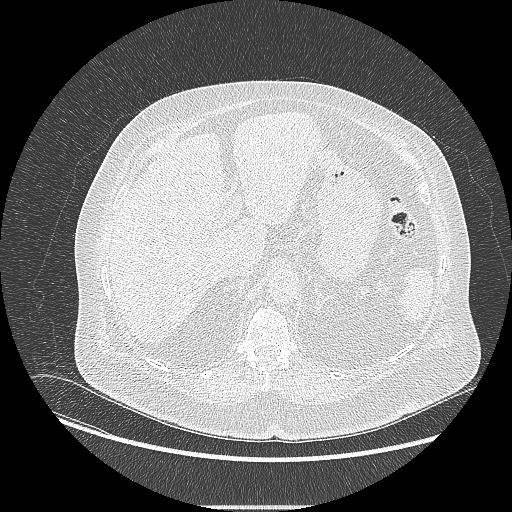
[im 45/237  lung]
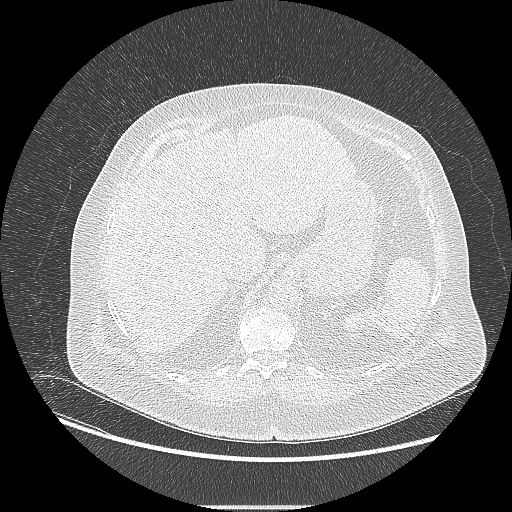
[im 57/237  lung]
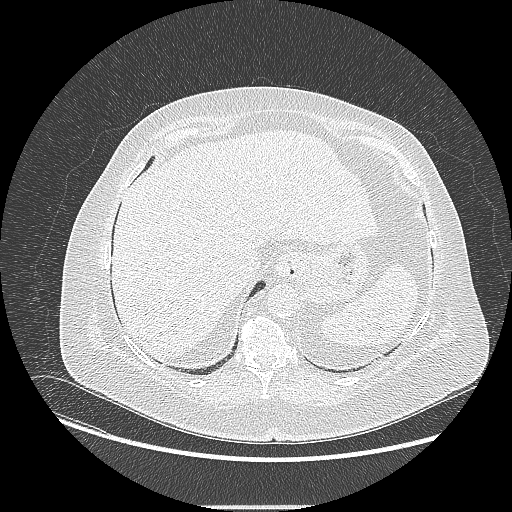
[im 79/237  mediastinal]
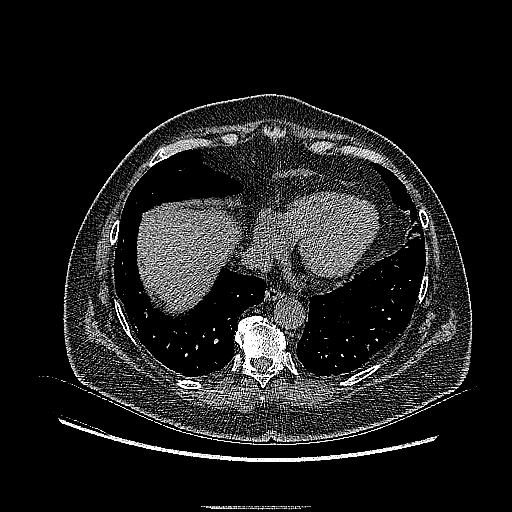
[im 79/237  lung]
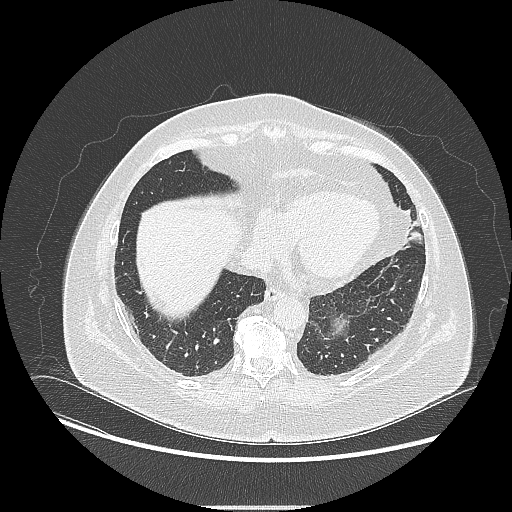
[im 90/237  lung]
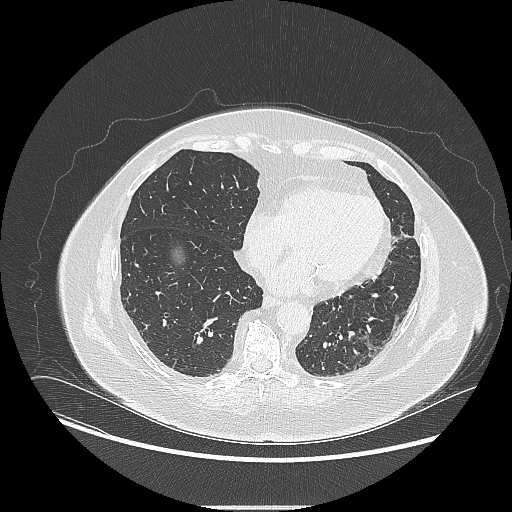
[im 102/237  lung]
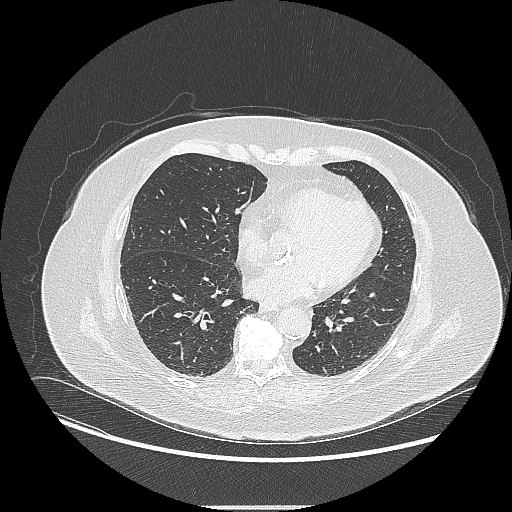
[im 124/237  lung]
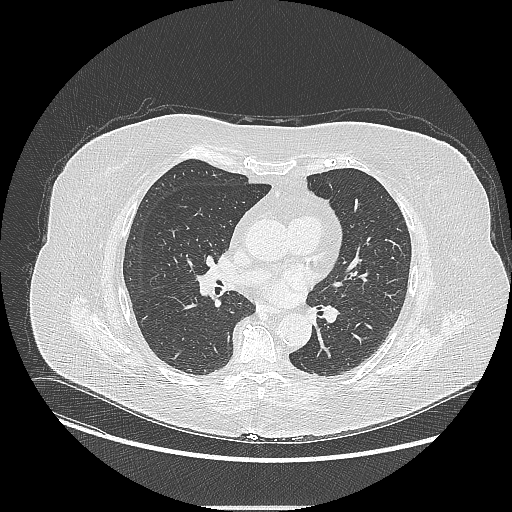
[im 135/237  mediastinal]
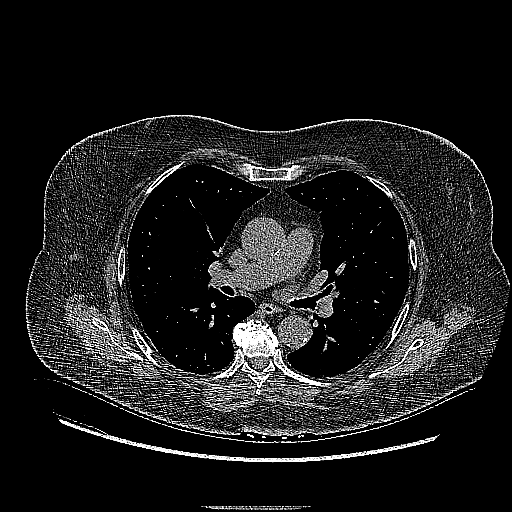
[im 135/237  lung]
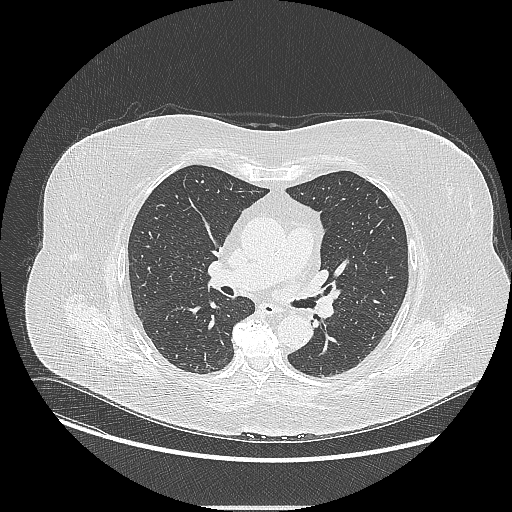
[im 147/237  lung]
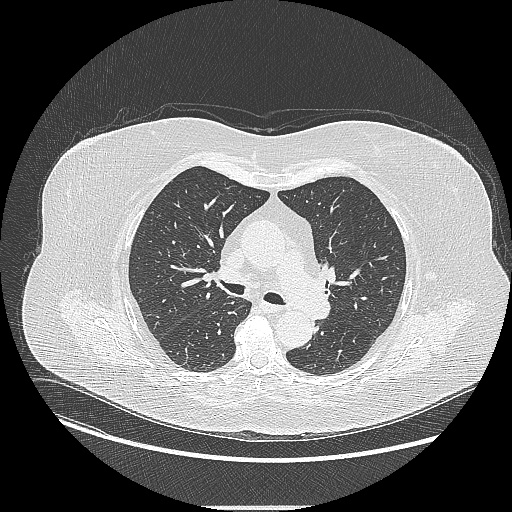
[im 169/237  lung]
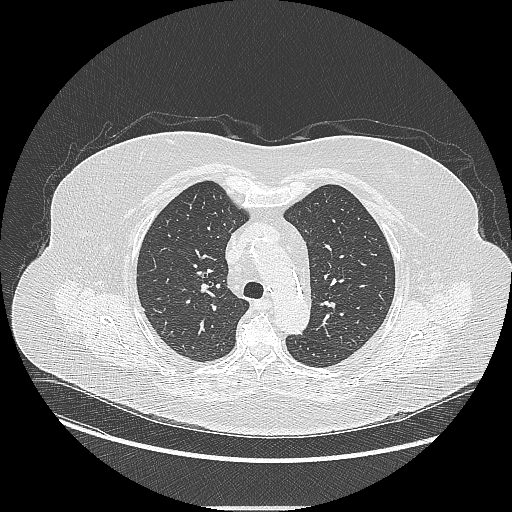
[im 180/237  lung]
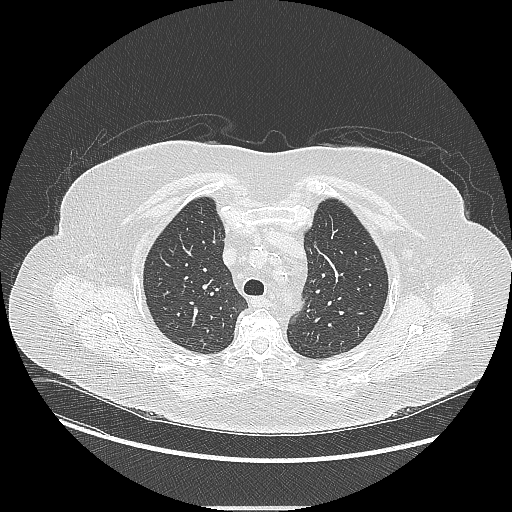
[im 192/237  mediastinal]
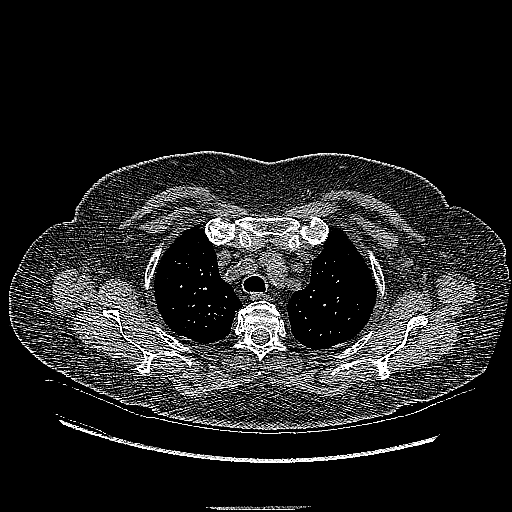
[im 192/237  lung]
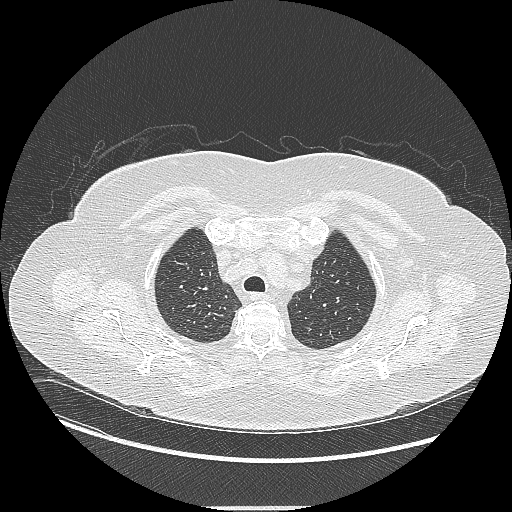
[im 214/237  lung]
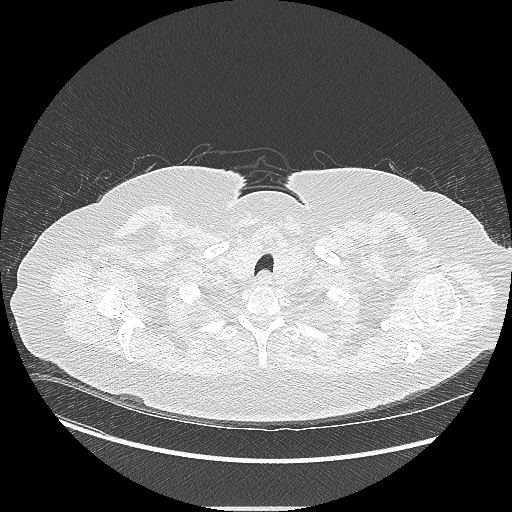
[im 225/237  lung]
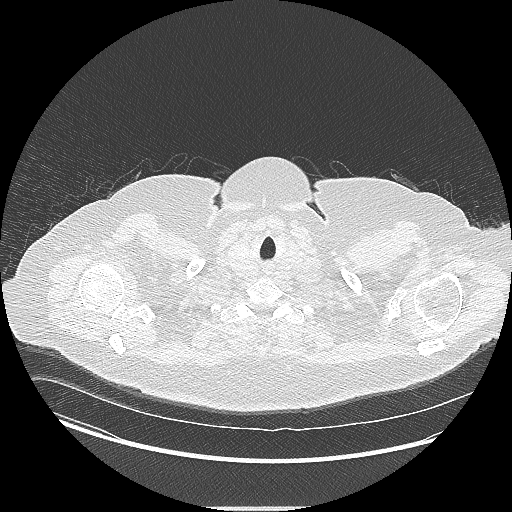

[15 of 40 positions shown; findings below may reference images not displayed]

FINDINGS: Cardiovascular: Advanced aortic and branch vessel atherosclerosis.
Tortuous thoracic aorta. Normal heart size, without pericardial
effusion. Multivessel coronary artery atherosclerosis.

Mediastinum/Nodes: Thyroid enlargement, greater on the left. This is
grossly similar to on the prior. No mediastinal or definite hilar
adenopathy, given limitations of unenhanced CT.

Lungs/Pleura: No pleural fluid. Mild centrilobular emphysema. Left
upper lobe non solid pulmonary nodule measures volume derived
equivalent diameter 4.6 mm. Scattered solid left-sided pulmonary
nodules of up to volume derived equivalent diameter 3.3 mm.

Upper Abdomen: Cholecystectomy. Normal imaged portions of the liver,
spleen, stomach, pancreas, adrenal glands. Bilateral renal cortical
thinning. Upper pole right renal collecting system stones.

Musculoskeletal: Moderate thoracic spondylosis.
IMPRESSION: 1. Lung-RADS 2, benign appearance or behavior. Continue annual
screening with low-dose chest CT without contrast in 12 months.
2. Coronary artery atherosclerosis. Aortic Atherosclerosis
([08]-[08]).
3.  Emphysema ([08]-[08]).

## 2018-01-06 ENCOUNTER — Other Ambulatory Visit: Payer: Self-pay | Admitting: Gastroenterology

## 2018-01-06 DIAGNOSIS — D132 Benign neoplasm of duodenum: Secondary | ICD-10-CM | POA: Diagnosis not present

## 2018-01-06 DIAGNOSIS — D500 Iron deficiency anemia secondary to blood loss (chronic): Secondary | ICD-10-CM | POA: Diagnosis not present

## 2018-01-15 DIAGNOSIS — H61031 Chondritis of right external ear: Secondary | ICD-10-CM | POA: Diagnosis not present

## 2018-01-15 DIAGNOSIS — L281 Prurigo nodularis: Secondary | ICD-10-CM | POA: Diagnosis not present

## 2018-01-15 DIAGNOSIS — D485 Neoplasm of uncertain behavior of skin: Secondary | ICD-10-CM | POA: Diagnosis not present

## 2018-01-15 DIAGNOSIS — D2371 Other benign neoplasm of skin of right lower limb, including hip: Secondary | ICD-10-CM | POA: Diagnosis not present

## 2018-01-15 DIAGNOSIS — L298 Other pruritus: Secondary | ICD-10-CM | POA: Diagnosis not present

## 2018-01-16 ENCOUNTER — Encounter (HOSPITAL_COMMUNITY): Payer: Self-pay

## 2018-01-16 ENCOUNTER — Other Ambulatory Visit: Payer: Self-pay

## 2018-01-27 ENCOUNTER — Other Ambulatory Visit: Payer: Self-pay | Admitting: Gastroenterology

## 2018-01-28 ENCOUNTER — Encounter (HOSPITAL_COMMUNITY)
Admission: RE | Disposition: A | Discharge status: Home / Self Care | Payer: Self-pay | Source: Ambulatory Visit | Attending: Gastroenterology

## 2018-01-28 ENCOUNTER — Encounter (HOSPITAL_COMMUNITY): Payer: Self-pay | Admitting: *Deleted

## 2018-01-28 ENCOUNTER — Other Ambulatory Visit: Payer: Self-pay

## 2018-01-28 ENCOUNTER — Ambulatory Visit (HOSPITAL_COMMUNITY)
Admission: RE | Admit: 2018-01-28 | Discharge: 2018-01-28 | Disposition: A | Discharge status: Home / Self Care | Payer: Medicare Other | Source: Ambulatory Visit | Attending: Gastroenterology | Admitting: Gastroenterology

## 2018-01-28 ENCOUNTER — Ambulatory Visit (HOSPITAL_COMMUNITY): Payer: Medicare Other | Admitting: Anesthesiology

## 2018-01-28 DIAGNOSIS — J449 Chronic obstructive pulmonary disease, unspecified: Secondary | ICD-10-CM | POA: Insufficient documentation

## 2018-01-28 DIAGNOSIS — K449 Diaphragmatic hernia without obstruction or gangrene: Secondary | ICD-10-CM | POA: Diagnosis not present

## 2018-01-28 DIAGNOSIS — K219 Gastro-esophageal reflux disease without esophagitis: Secondary | ICD-10-CM | POA: Diagnosis not present

## 2018-01-28 DIAGNOSIS — Z87891 Personal history of nicotine dependence: Secondary | ICD-10-CM | POA: Insufficient documentation

## 2018-01-28 DIAGNOSIS — D132 Benign neoplasm of duodenum: Secondary | ICD-10-CM | POA: Diagnosis not present

## 2018-01-28 DIAGNOSIS — K317 Polyp of stomach and duodenum: Secondary | ICD-10-CM | POA: Diagnosis not present

## 2018-01-28 DIAGNOSIS — I10 Essential (primary) hypertension: Secondary | ICD-10-CM | POA: Insufficient documentation

## 2018-01-28 DIAGNOSIS — G4730 Sleep apnea, unspecified: Secondary | ICD-10-CM | POA: Insufficient documentation

## 2018-01-28 HISTORY — PX: ESOPHAGOGASTRODUODENOSCOPY (EGD) WITH PROPOFOL: SHX5813

## 2018-01-28 HISTORY — PX: HOT HEMOSTASIS: SHX5433

## 2018-01-28 LAB — GLUCOSE, CAPILLARY: Glucose-Capillary: 87 mg/dL (ref 65–99)

## 2018-01-28 SURGERY — ESOPHAGOGASTRODUODENOSCOPY (EGD) WITH PROPOFOL
Anesthesia: Monitor Anesthesia Care

## 2018-01-28 MED ORDER — ESMOLOL HCL 100 MG/10ML IV SOLN
INTRAVENOUS | Status: DC | PRN
  Administered 2018-01-28: 10 mg via INTRAVENOUS

## 2018-01-28 MED ORDER — SODIUM CHLORIDE 0.9 % IV SOLN: INTRAVENOUS | Status: DC

## 2018-01-28 MED ORDER — LACTATED RINGERS IV SOLN
INTRAVENOUS | Status: DC
  Administered 2018-01-28: 12:00:00 via INTRAVENOUS

## 2018-01-28 MED ORDER — PROPOFOL 10 MG/ML IV BOLUS
INTRAVENOUS | Status: AC
  Filled 2018-01-28: qty 40

## 2018-01-28 MED ORDER — PROPOFOL 10 MG/ML IV BOLUS
INTRAVENOUS | Status: AC
  Filled 2018-01-28: qty 20

## 2018-01-28 MED ORDER — PROPOFOL 500 MG/50ML IV EMUL
INTRAVENOUS | Status: DC | PRN
  Administered 2018-01-28: 125 ug/kg/min via INTRAVENOUS

## 2018-01-28 SURGICAL SUPPLY — 15 items

## 2018-01-28 NOTE — Anesthesia Postprocedure Evaluation (Signed)
Anesthesia Post Note  Patient: [name] [name]  Procedure(s) Performed: ESOPHAGOGASTRODUODENOSCOPY (EGD) WITH PROPOFOL (N/A ) HOT HEMOSTASIS (ARGON PLASMA COAGULATION/BICAP) (N/A )     Patient location during evaluation: Endoscopy Anesthesia Type: MAC Level of consciousness: awake and alert Pain management: pain level controlled Vital Signs Assessment: post-procedure vital signs reviewed and stable Respiratory status: spontaneous breathing, nonlabored ventilation, respiratory function stable and patient connected to nasal cannula oxygen Cardiovascular status: stable and blood pressure returned to baseline Postop Assessment: no apparent nausea or vomiting Anesthetic complications: no    Last Vitals:  Vitals:   01/28/18 1345 01/28/18 1346  BP:  122/60  Pulse:  79  Resp:  19  Temp:    SpO2: 97% 97%    Last Pain:  Vitals:   01/28/18 1330  TempSrc: Oral                 Barnet Glasgow

## 2018-01-28 NOTE — Anesthesia Preprocedure Evaluation (Signed)
Anesthesia Evaluation  Patient identified by MRN, date of birth, ID band Patient awake    Reviewed: Allergy & Precautions, NPO status , Patient's Chart, lab work & pertinent test results  Airway Mallampati: II  TM Distance: >3 FB Neck ROM: Full    Dental no notable dental hx.    Pulmonary neg pulmonary ROS, sleep apnea , COPD, former smoker,    Pulmonary exam normal breath sounds clear to auscultation       Cardiovascular hypertension, negative cardio ROS Normal cardiovascular exam Rhythm:Regular Rate:Normal     Neuro/Psych negative neurological ROS  negative psych ROS   GI/Hepatic negative GI ROS, Neg liver ROS,   Endo/Other  negative endocrine ROSdiabetes  Renal/GU negative Renal ROS  negative genitourinary   Musculoskeletal negative musculoskeletal ROS (+)   Abdominal   Peds negative pediatric ROS (+)  Hematology negative hematology ROS (+)   Anesthesia Other Findings   Reproductive/Obstetrics negative OB ROS                             Anesthesia Physical Anesthesia Plan  ASA: III  Anesthesia Plan: MAC   Post-op Pain Management:    Induction: Intravenous  PONV Risk Score and Plan: Treatment may vary due to age or medical condition  Airway Management Planned: Mask, Natural Airway and Nasal Cannula  Additional Equipment:   Intra-op Plan:   Post-operative Plan:   Informed Consent: I have reviewed the patients History and Physical, chart, labs and discussed the procedure including the risks, benefits and alternatives for the proposed anesthesia with the patient or authorized representative who has indicated his/her understanding and acceptance.   Dental advisory given  Plan Discussed with: CRNA  Anesthesia Plan Comments:         Anesthesia Quick Evaluation

## 2018-01-28 NOTE — Discharge Instructions (Signed)
YOU HAD AN ENDOSCOPIC PROCEDURE TODAY: Refer to the procedure report and other information in the discharge instructions given to you for any specific questions about what was found during the examination. If this information does not answer your questions, please call [facility] office at [phone] to clarify.   YOU SHOULD EXPECT: Some feelings of bloating in the abdomen. Passage of more gas than usual. Walking can help get rid of the air that was put into your GI tract during the procedure and reduce the bloating. If you had a lower endoscopy (such as a colonoscopy or flexible sigmoidoscopy) you may notice spotting of blood in your stool or on the toilet paper. Some abdominal soreness may be present for a day or two, also.  DIET: Your first meal following the procedure should be a light meal and then it is ok to progress to your normal diet. A half-sandwich or bowl of soup is an example of a good first meal. Heavy or fried foods are harder to digest and may make you feel nauseous or bloated. Drink plenty of fluids but you should avoid alcoholic beverages for 24 hours. If you had a esophageal dilation, please see attached instructions for diet.   ACTIVITY: Your care partner should take you home directly after the procedure. You should plan to take it easy, moving slowly for the rest of the day. You can resume normal activity the day after the procedure however YOU SHOULD NOT DRIVE, use power tools, machinery or perform tasks that involve climbing or major physical exertion for 24 hours (because of the sedation medicines used during the test).   SYMPTOMS TO REPORT IMMEDIATELY: A gastroenterologist can be reached at any hour. Please call [phone]  for any of the following symptoms:  Following upper endoscopy (EGD, EUS, ERCP, esophageal dilation) Vomiting of blood or coffee ground material  New, significant abdominal pain  New, significant chest pain or pain under the shoulder blades  Painful or  persistently difficult swallowing  New shortness of breath  Black, tarry-looking or red, bloody stools  FOLLOW UP:  If any biopsies were taken you will be contacted by phone or by letter within the next 1-3 weeks. Call [phone]  if you have not heard about the biopsies in 3 weeks.  Please also call with any specific questions about appointments or follow up tests. Clear liquids only until 6 PM and then soft solids tonight and advance diet tomorrow and call if question or problem otherwise in one week about biopsy results and no aspirin or nonsteroidals for 2 weeks but may use Tylenol over-the-counter only

## 2018-01-28 NOTE — Progress Notes (Signed)
[name] [name] 12:58 PM  Subjective: Patient doing well without any GI complaints and no new problems since seen recently in the office  Objective: Vital signs stable afebrile no acute distress exam please see preassessment evaluation  Assessment: Duodenal polyp  Plan: Okay to proceed with repeat endoscopy and with anesthesia assistance  [facility]  Pager [phone] After 5PM or if no answer call [phone]

## 2018-01-28 NOTE — Transfer of Care (Signed)
Immediate Anesthesia Transfer of Care Note  Patient: Jaylin T Carberry  Procedure(s) Performed: ESOPHAGOGASTRODUODENOSCOPY (EGD) WITH PROPOFOL (N/A ) HOT HEMOSTASIS (ARGON PLASMA COAGULATION/BICAP) (N/A )  Patient Location: PACU  Anesthesia Type:MAC  Level of Consciousness: sedated, drowsy, patient cooperative and responds to stimulation  Airway & Oxygen Therapy: Patient Spontanous Breathing and Patient connected to nasal cannula oxygen  Post-op Assessment: Report given to RN and Post -op Vital signs reviewed and stable  Post vital signs: Reviewed and stable  Last Vitals:  Vitals:   01/28/18 1150  BP: (!) 157/72  Pulse: 79  Resp: (!) 21  Temp: 36.6 C  SpO2: 98%    Last Pain:  Vitals:   01/28/18 1150  TempSrc: Oral         Complications: No apparent anesthesia complications

## 2018-01-28 NOTE — Op Note (Signed)
[facility] Patient Name: [name] [name] Procedure Date: 01/28/2018 MRN: [id number] Attending MD: Clarene Essex , MD Date of Birth: 23-Apr-1941 CSN: [id number] Age: 77 Admit Type: Outpatient Procedure:                Upper GI endoscopy Indications:              Polyp in the duodenum Providers:                Clarene Essex, MD, Angus Seller, William Dalton,                            Technician, Laurena Spies, Technician, Cleda Daub, RN Referring MD:              Medicines:                Propofol total dose 093 mg IV Complications:            No immediate complications. Estimated Blood Loss:     Estimated blood loss: none. Procedure:                Pre-Anesthesia Assessment:                           - Prior to the procedure, a History and Physical                            was performed, and patient medications and                            allergies were reviewed. The patient's tolerance of                            previous anesthesia was also reviewed. The risks                            and benefits of the procedure and the sedation                            options and risks were discussed with the patient.                            All questions were answered, and informed consent                            was obtained. Prior Anticoagulants: The patient has                            taken no previous anticoagulant or antiplatelet                            agents. ASA Grade Assessment: II - A patient with  mild systemic disease. After reviewing the risks                            and benefits, the patient was deemed in                            satisfactory condition to undergo the procedure.                           After obtaining informed consent, the endoscope was                            passed under direct vision. Throughout the                            procedure, the patient's blood  pressure, pulse, and                            oxygen saturations were monitored continuously. The                            [id number] ([id number]) scope was introduced through the                            mouth, and advanced to the third part of duodenum.                            The upper GI endoscopy was accomplished without                            difficulty. The patient tolerated the procedure                            well. Scope In: Scope Out: Findings:      The larynx was normal.      A small hiatal hernia was present.      The entire examined stomach was normal.      A single medium-sized semi-sessile polyp with no bleeding was found in       the second portion of the duodenum. The polyp was removed with a       piecemeal technique using a hot snare x 2 on setting 2/15. ? completely       Resection and retrieval was complete.      The duodenal bulb, first portion of the duodenum and third portion of       the duodenum were normal.      The exam was otherwise without abnormality. Impression:               - Normal larynx.                           - Small hiatal hernia.                           - Normal stomach.                           -  A single duodenal polyp. ?completely Resected and                            retrieved.                           - Normal duodenal bulb, first portion of the                            duodenum and third portion of the duodenum.                           - The examination was otherwise normal. Moderate Sedation:      N/A- Per Anesthesia Care Recommendation:           - Patient has a contact number available for                            emergencies. The signs and symptoms of potential                            delayed complications were discussed with the                            patient. Return to normal activities tomorrow.                            Written discharge instructions were provided to the                             patient.                           - Clear liquid diet for 4 hours.                           - Continue present medications.                           - Await pathology results. Repeat endoscopy one to                            2 years depending on pathology and clinical course                           - Return to GI clinic PRN.                           - Telephone GI clinic for pathology results in 1                            week.                           - Telephone GI clinic if symptomatic PRN. Procedure Code(s):        --- Professional ---  43251, Esophagogastroduodenoscopy, flexible,                            transoral; with removal of tumor(s), polyp(s), or                            other lesion(s) by snare technique Diagnosis Code(s):        --- Professional ---                           K44.9, Diaphragmatic hernia without obstruction or                            gangrene                           K31.7, Polyp of stomach and duodenum CPT copyright 2016 [employer]. All rights reserved. The codes documented in this report are preliminary and upon coder review may  be revised to meet current compliance requirements. Clarene Essex, MD 01/28/2018 1:32:46 PM This report has been signed electronically. Number of Addenda: 0

## 2018-01-29 ENCOUNTER — Encounter (HOSPITAL_COMMUNITY): Payer: Self-pay | Admitting: Gastroenterology

## 2018-02-26 DIAGNOSIS — L281 Prurigo nodularis: Secondary | ICD-10-CM | POA: Diagnosis not present

## 2018-05-20 DIAGNOSIS — E119 Type 2 diabetes mellitus without complications: Secondary | ICD-10-CM | POA: Diagnosis not present

## 2018-06-12 ENCOUNTER — Telehealth: Payer: Self-pay | Admitting: Internal Medicine

## 2018-06-12 DIAGNOSIS — G4733 Obstructive sleep apnea (adult) (pediatric): Secondary | ICD-10-CM

## 2018-06-12 NOTE — Telephone Encounter (Signed)
Pt is requesting order for new cpap machine and supplies to be sent to [facility]. Pt last seen on 09/27/16. Pt has been scheduled for 09/15/18, as this was first available with [facility]. Pt is requesting that order for supplies be sent to [facility], prior to apt  [facility] please advise. Thanks

## 2018-06-12 NOTE — Telephone Encounter (Signed)
Compliant and continues to benefit from CPAP without break in therapy.  Please order replacement for old CPAP machine, change to auto 5-15, mask of choice, humidifier, supplies, AirView  Dx OSA

## 2018-06-12 NOTE — Telephone Encounter (Signed)
Spoke with pt and advised her that I would send the order for supplies to [facility]. Nothing further is needed.

## 2018-06-17 DIAGNOSIS — E7849 Other hyperlipidemia: Secondary | ICD-10-CM | POA: Diagnosis not present

## 2018-06-17 DIAGNOSIS — E1149 Type 2 diabetes mellitus with other diabetic neurological complication: Secondary | ICD-10-CM | POA: Diagnosis not present

## 2018-06-17 DIAGNOSIS — I10 Essential (primary) hypertension: Secondary | ICD-10-CM | POA: Diagnosis not present

## 2018-06-17 DIAGNOSIS — J449 Chronic obstructive pulmonary disease, unspecified: Secondary | ICD-10-CM | POA: Diagnosis not present

## 2018-06-17 DIAGNOSIS — E1129 Type 2 diabetes mellitus with other diabetic kidney complication: Secondary | ICD-10-CM | POA: Diagnosis not present

## 2018-06-17 DIAGNOSIS — Z6830 Body mass index (BMI) 30.0-30.9, adult: Secondary | ICD-10-CM | POA: Diagnosis not present

## 2018-06-17 DIAGNOSIS — D508 Other iron deficiency anemias: Secondary | ICD-10-CM | POA: Diagnosis not present

## 2018-06-17 DIAGNOSIS — R079 Chest pain, unspecified: Secondary | ICD-10-CM | POA: Diagnosis not present

## 2018-06-17 DIAGNOSIS — I2510 Atherosclerotic heart disease of native coronary artery without angina pectoris: Secondary | ICD-10-CM | POA: Diagnosis not present

## 2018-06-17 DIAGNOSIS — N183 Chronic kidney disease, stage 3 (moderate): Secondary | ICD-10-CM | POA: Diagnosis not present

## 2018-06-30 ENCOUNTER — Encounter: Payer: Self-pay | Admitting: Cardiology

## 2018-06-30 ENCOUNTER — Ambulatory Visit (INDEPENDENT_AMBULATORY_CARE_PROVIDER_SITE_OTHER): Payer: Medicare Other | Admitting: Cardiology

## 2018-06-30 VITALS — BP 148/83 | HR 77 | Ht 63.0 in | Wt 177.2 lb

## 2018-06-30 DIAGNOSIS — R079 Chest pain, unspecified: Secondary | ICD-10-CM | POA: Diagnosis not present

## 2018-06-30 DIAGNOSIS — E785 Hyperlipidemia, unspecified: Secondary | ICD-10-CM

## 2018-06-30 DIAGNOSIS — E1169 Type 2 diabetes mellitus with other specified complication: Secondary | ICD-10-CM

## 2018-06-30 DIAGNOSIS — G4733 Obstructive sleep apnea (adult) (pediatric): Secondary | ICD-10-CM | POA: Diagnosis not present

## 2018-06-30 DIAGNOSIS — I10 Essential (primary) hypertension: Secondary | ICD-10-CM

## 2018-06-30 DIAGNOSIS — I350 Nonrheumatic aortic (valve) stenosis: Secondary | ICD-10-CM | POA: Diagnosis not present

## 2018-06-30 DIAGNOSIS — I2510 Atherosclerotic heart disease of native coronary artery without angina pectoris: Secondary | ICD-10-CM | POA: Insufficient documentation

## 2018-06-30 DIAGNOSIS — R072 Precordial pain: Secondary | ICD-10-CM | POA: Insufficient documentation

## 2018-06-30 HISTORY — PX: NM MYOVIEW LTD: HXRAD82

## 2018-06-30 HISTORY — PX: TRANSTHORACIC ECHOCARDIOGRAM: SHX275

## 2018-06-30 NOTE — Progress Notes (Signed)
PCP: Marton Redwood, MD  Pulmonologist: Dr. Annamaria Boots  Clinic Note: Chief Complaint  Patient presents with  . New Patient (Initial Visit)    Chest tightness and shortness of breath with exertion    HPI: [name] [name]" is a 77 y.o. female - former bank manager/VP who is being seen today for the evaluation of chest pain/tightness occasionally walking up stairs, ha)s "plaque build up"  at the request of Marton Redwood, MD. CRFs: OSA, HTN, HLD,  -- She has OSA - recently changed to CPAP, and ? Bicuspid Aortic Valve with Coronary calcification noted on CT chest.  [name] [name] was last seen by Dr. Brigitte Pulse on June 17, 2018 --> she noted having mild chest discomfort when climbing stairs along with dyspnea.  Improved with rest.  Episodes are not prolonged, and always relieved with rest.  Recent Hospitalizations:   none  Studies Personally Reviewed - (if available, images/films reviewed: From Epic Chart or Care Everywhere)   Transthoracic Echo February 2017: EF 55-60%.  No regional wall motion normality.  Possible bicuspid aortic valve with thickening of leaflets.  Mild stenosis (mean gradient 15 mmHg).  CT Chest - Advanced aortic and branch vessel atherosclerosis. Tortuous thoracic aorta. Normal heart size, without pericardial effusion. Multivessel coronary artery atherosclerosis  Interval History: "[name] [name]" presents here today for evaluation of a bit of episodes of chest discomfort that she notes mostly with going up steps associated with dyspnea and improved with rest.  She is always had some baseline dyspnea from her COPD and deconditioning.  Basically since she retired, she is stopped "doing anything ".  She really has been relatively sedentary, having put on weight etc.  She describes the chest discomfort as maybe 1-2/10, and slow but they are with exertion.  Episode does not last very long and usually stops shortly at rest.  There is been no resting discomfort, but the intensity of  symptom and duration seems to have increased over the last month or so. She denies heart failure symptoms of PND, orthopnea or edema.  She is occasionally dizzy but this is worse when she lies down in bed as opposed to sitting up.  She denies any significant palpitations irregular heartbeats.  She does note some morning cough but no real dyspnea.  Her mouth is really dry from CPAP.  No weakness , syncope/near syncope, or TIA/amaurosis fugax symptoms. No melena, hematochezia, hematuria, or epstaxis. No claudication.  ROS: A comprehensive was performed. Review of Systems  Constitutional: Positive for malaise/fatigue (all the time; both motivating & with activity). Negative for chills and fever.  HENT: Positive for congestion (drainiage). Negative for nosebleeds.   Respiratory: Positive for cough (morning cough) and shortness of breath (exertional ). Negative for sputum production and wheezing.   Cardiovascular: Positive for orthopnea and PND (maybe - but hard to tell with OSA).  Gastrointestinal: Negative for abdominal pain, blood in stool and melena.  Genitourinary: Negative for hematuria.  Musculoskeletal: Negative for falls (almost fall - (has a fear of falling) ).  Skin: Positive for itching (les - sees a dermatologist).  Neurological: Negative for weakness.  Endo/Heme/Allergies: Positive for environmental allergies. Does not bruise/bleed easily.  Psychiatric/Behavioral: Negative for memory loss. The patient is not nervous/anxious and does not have insomnia.   All other systems reviewed and are negative.   PAD Screen 06/30/2018  Previous PAD dx? No  Previous surgical procedure? No  Pain with walking? No  Feet/toe relief with dangling? No  Painful, non-healing ulcers? No  Extremities discolored? No    I have reviewed and (if needed) personally updated the patient's problem list, medications, allergies, past medical and surgical history, social and family history.   Past Medical  History:  Diagnosis Date  . Aortic valve stenosis, mild    per echo 02-13-2016  possible bicuspid w/ moderate thickened and calicified ,  valve area 1.34cm^2    pt denies  . COPD (chronic obstructive pulmonary disease) ([place])   . GERD (gastroesophageal reflux disease)    no problems since started on CPAP  . Hematuria   . History of adenomatous polyp of colon    tubular adnenoma's and hyperplastic polyp's 05-19-2002;  08-15-2010;  2015  . History of kidney stones    multiple since 1970's  . History of squamous cell carcinoma excision    05-14-2012  right arm;  04-18-2015  left ankle  . HTN (hypertension)   . Hyperlipidemia   . Iron deficiency anemia   . Left ureteral stone   . OSA on CPAP    per study 10-13-2006 mild to moderate osa w/ hypersomnia  . Seasonal and perennial allergic rhinitis   . Type 2 diabetes mellitus (HCC)    type 2  . Urgency of urination     Past Surgical History:  Procedure Laterality Date  . CARPAL TUNNEL RELEASE Bilateral 2003  . COLONOSCOPY  last one 2015  . CYSTOSCOPY WITH RETROGRADE PYELOGRAM, URETEROSCOPY AND STENT PLACEMENT Left 04/15/2017   Procedure: CYSTOSCOPY WITH LEFT  RETROGRADE PYELOGRAM, URETEROSCOPYwith stone basketry;  Surgeon: Kathie Rhodes, MD;  Location: [facility];  Service: Urology;  Laterality: Left;  . D & C HYSTEROSCOPY W/ RESECTION FIBROID AND POLYP  07/30/2000  . DOBUTAMINE STRESS ECHO  01-30-2008   [facility]   no ischemia/  normal wall motion/  post stress ef 79%  . ESOPHAGOGASTRODUODENOSCOPY (EGD) WITH PROPOFOL N/A 01/28/2018   Procedure: ESOPHAGOGASTRODUODENOSCOPY (EGD) WITH PROPOFOL;  Surgeon: Clarene Essex, MD;  Location: [facility];  Service: Endoscopy;  Laterality: N/A;  . [place]   trauma   . HOT HEMOSTASIS N/A 01/28/2018   Procedure: HOT HEMOSTASIS (ARGON PLASMA COAGULATION/BICAP);  Surgeon: Clarene Essex, MD;  Location: [facility];  Service: Endoscopy;  Laterality: N/A;  . LAPAROSCOPIC  CHOLECYSTECTOMY  08/20/2001   w/ ERCP spincterotomy w/ balloon  . TRANSTHORACIC ECHOCARDIOGRAM  02/13/2016   grade 1 diastolic dysfunction,  ef 55-60%/  AV possible bicuspid , moderate thickened, moderate calcification w/ mild stenosis (valve area 1.34cm^2), no regurg.  . TUBAL LIGATION Bilateral yrs ago    Current Meds  Medication Sig  . albuterol (VENTOLIN HFA) 108 (90 Base) MCG/ACT inhaler Inhale 2 puffs into the lungs every 6 (six) hours as needed for wheezing or shortness of breath.  Marland Kitchen amLODipine (NORVASC) 5 MG tablet Take 5 mg by mouth daily.  Marland Kitchen escitalopram (LEXAPRO) 10 MG tablet Take 10 mg by mouth every morning.   [name] [name] (VASCEPA) 1 g CAPS Take 2 capsules by mouth 2 (two) times daily.  Marland Kitchen levocetirizine (XYZAL) 5 MG tablet Take 5 mg by mouth every evening.  Marland Kitchen losartan (COZAAR) 50 MG tablet Take 50 mg by mouth daily.  . Melatonin 5 MG TABS Take 1 tablet by mouth at bedtime as needed (sleep).  . metFORMIN (GLUCOPHAGE) 1000 MG tablet Take 1,000 mg by mouth 2 (two) times daily with a meal.    . simvastatin (ZOCOR) 20 MG tablet Take 20 mg by mouth daily.  Marland Kitchen triamcinolone ointment (KENALOG)  0.1 % Apply 1 application topically 2 (two) times daily.  Marland Kitchen Umeclidinium-Vilanterol (ANORO ELLIPTA) 62.5-25 MCG/INH AEPB Inhale 1 puff into the lungs daily as needed (Asthma).   . Vitamin D, Ergocalciferol, (DRISDOL) 50000 units CAPS capsule Take 50,000 Units by mouth every 7 (seven) days. friday's    No Known Allergies  Social History   Tobacco Use  . Smoking status: Former Smoker    Packs/day: 0.25    Years: 50.00    Pack years: 12.50    Types: Cigarettes    Last attempt to quit: 12/01/2015    Years since quitting: 2.6  . Smokeless tobacco: Never Used  Substance Use Topics  . Alcohol use: Yes    Alcohol/week: 0.0 oz    Comment: rare  . 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   Caffeine: 1 drink/day exercise routine to because of back pain.    family history includes Alzheimer's disease in her maternal grandmother and mother; Brain cancer in her paternal grandmother; Colon cancer in her paternal aunt, paternal grandfather, paternal uncle, and paternal uncle; Heart attack in her maternal uncle and paternal grandfather; Heart disease in her maternal grandmother and paternal grandmother; Hyperlipidemia in her brother; Hypertension in her brother; Lung cancer in her maternal uncle.  Wt Readings from Last 3 Encounters:  06/30/18 177 lb 3.2 oz (80.4 kg)  01/28/18 178 lb (80.7 kg)  04/15/17 184 lb (83.5 kg)    PHYSICAL EXAM BP (!) 148/83   Pulse 77   Ht 5\' 3"  (1.6 m)   Wt 177 lb 3.2 oz (80.4 kg)   BMI 31.39 kg/m  Physical Exam  Constitutional: She is oriented to person, place, and time. She appears well-developed and well-nourished. No distress.  Healthy-appearing.  Well-groomed.  HENT:  Head: Normocephalic and atraumatic.  Eyes: Pupils are equal, round, and reactive to light. Conjunctivae and EOM are normal.  Neck: Normal range of motion. Neck supple. No hepatojugular reflux and no JVD present. Carotid bruit is not present. No thyromegaly present.  Cardiovascular: Normal rate, regular rhythm and normal pulses.  No extrasystoles are present. PMI is not displaced.  Murmur heard.  Harsh crescendo-decrescendo midsystolic murmur is present with a grade of 2/6 at the upper right sternal border radiating to the neck. Pulmonary/Chest: Effort normal. No respiratory distress. She has no wheezes. She has no rales. She exhibits no tenderness.  Distant breath sounds with increased AP diameter.  Abdominal: Soft. Bowel sounds are normal. She exhibits no distension. There is no tenderness. There is no rebound.  No HSM    Musculoskeletal: Normal range of motion. She exhibits edema (trivial).  Neurological: She is alert and oriented to person, place, and time. No cranial nerve deficit.  Skin: Skin is warm and dry. No rash noted. No erythema.  Psychiatric: She has a normal mood and affect. Her behavior is normal. Judgment and thought content normal.  Vitals reviewed.   Adult ECG Report  Rate: 77 ;  Rhythm: normal sinus rhythm and Normal axis, intervals and durations;   Narrative Interpretation: Normal EKG   Other studies Reviewed: Additional studies/ records that were reviewed today include:  Recent Labs: June 17, 2018  Na+ 145, K+ 4.9,  Cl- 109, HCO3- 21, BUN 18, Cr 2.0, Glu 107, Ca2+ 10.2; AST 16, ALT 14, AlkP 66  CBC: W 11.4, H/H 11.0/35.1, Plt 343  TC 173, TG 315, HDL 38, LDL 56   ASSESSMENT / PLAN: Problem List Items Addressed This Visit    Obstructive sleep apnea (Chronic)    Reportedly has good compliance with  her CPAP.  Certainly this is a risk factor for coronary disease and hypertension. She is being on CPAP.  Defer management to pulmonologist.      Mild aortic stenosis by prior echocardiogram (Chronic)    Reportedly mild aortic stenosis by echo in 2017.  Murmur does some little louder now than one would expect for mild stenosis, however need to reassess. Plan: 2D echo.      Relevant Medications   losartan (COZAAR) 50 MG tablet   Other Relevant Orders   EKG 12-Lead (Completed)   MYOCARDIAL PERFUSION IMAGING   ECHOCARDIOGRAM COMPLETE   Hyperlipidemia associated with type 2 diabetes mellitus (HCC) (Chronic)    LDL and HDL look great simvastatin and supper, however she continues to have hypertriglyceridemia.  The combination of high triglycerides, hypertension and diabetes = Metabolic Syndrome -- > increase his risk for CAD.  Pending cardiac evaluation, may need more aggressive risk factor reduction with being elevated.  Is currently on high-dose  Vascepa.      Relevant  Medications   losartan (COZAAR) 50 MG tablet   Essential hypertension (Chronic)    Blood pressure is still relatively high on amlodipine and losartan.  Depending on what we see on stress test, may want to consider adding carvedilol      Relevant Medications   losartan (COZAAR) 50 MG tablet   Coronary artery calcification seen on computed tomography (Chronic)    Coronary artery calcification seen on CT scan and the patient now with symptoms concerning for ischemic CAD.  Plan: Myoview stress test.  Would not want extensive calcification to cloud image sensitivity and specificity. Since she is actively having symptoms, will hopefully see an ischemic finding, but low threshold to investigate further if symptoms persist      Relevant Medications   losartan (COZAAR) 50 MG tablet   Other Relevant Orders   EKG 12-Lead (Completed)   MYOCARDIAL PERFUSION IMAGING   ECHOCARDIOGRAM COMPLETE   Chest pain with moderate risk for cardiac etiology - Primary    Concerning exertional chest discomfort associate with dyspnea.  Will continue finding.  With coronary artery calcification seen on CT scan and family history of heart disease, will evaluate with Myoview stress test.  The extent of calcification may make it hard to actually determine if there is truly a lesion.  We will continue with amlodipine  for anginal benefit.  Can consider beta-blocker depending on catheter results. On statin --> recommend starting aspirin      Relevant Orders   EKG 12-Lead (Completed)   MYOCARDIAL PERFUSION IMAGING   ECHOCARDIOGRAM COMPLETE      I spent a total of 40 minutes with the patient and chart review. >  50% of the time was spent in direct patient consultation.   Current medicines are reviewed at length with the patient today.  (+/- concerns) n/a The following changes have been made:  n/a  Patient Instructions  No medication changes   Schedule at [facility] has  requested that you have an echocardiogram. Echocardiography is a painless test that uses sound waves to create images of your heart.  It provides your doctor with information about the size and shape of your heart and how well your heart's chambers and valves are working. This procedure takes approximately one hour. There are no restrictions for this procedure. And Schedule at [facility] has requested that you have an exercise stress myoview. For further information please visit [email or web address]. Please follow instruction sheet, as given.   .Your physician recommends that you schedule a follow-up appointment in 1 to 2 months with Dr Ellyn Hack.     Studies Ordered:   Orders Placed This Encounter  Procedures  . MYOCARDIAL PERFUSION IMAGING  . EKG 12-Lead  . ECHOCARDIOGRAM COMPLETE      Glenetta Hew, M.D., M.S. Interventional Cardiologist   Pager # [phone] Phone # [phone] [address] [place], [place] [address]   Thank you for choosing [facility] at [facility]!!

## 2018-06-30 NOTE — Patient Instructions (Signed)
No medication changes   Schedule at [facility] has requested that you have an echocardiogram. Echocardiography is a painless test that uses sound waves to create images of your heart. It provides your doctor with information about the size and shape of your heart and how well your heart's chambers and valves are working. This procedure takes approximately one hour. There are no restrictions for this procedure. And Schedule at [facility] has requested that you have an exercise stress myoview. For further information please visit [email or web address]. Please follow instruction sheet, as given.   .Your physician recommends that you schedule a follow-up appointment in 1 to 2 months with Dr Ellyn Hack.

## 2018-07-08 ENCOUNTER — Encounter: Payer: Self-pay | Admitting: Cardiology

## 2018-07-08 NOTE — Assessment & Plan Note (Signed)
Concerning exertional chest discomfort associate with dyspnea.  Will continue finding.  With coronary artery calcification seen on CT scan and family history of heart disease, will evaluate with Myoview stress test.  The extent of calcification may make it hard to actually determine if there is truly a lesion.  We will continue with amlodipine  for anginal benefit.  Can consider beta-blocker depending on catheter results. On statin --> recommend starting aspirin

## 2018-07-08 NOTE — Assessment & Plan Note (Signed)
LDL and HDL look great simvastatin and supper, however she continues to have hypertriglyceridemia.  The combination of high triglycerides, hypertension and diabetes = Metabolic Syndrome -- > increase his risk for CAD.  Pending cardiac evaluation, may need more aggressive risk factor reduction with being elevated.  Is currently on high-dose  Vascepa.

## 2018-07-08 NOTE — Assessment & Plan Note (Signed)
Coronary artery calcification seen on CT scan and the patient now with symptoms concerning for ischemic CAD.  Plan: Myoview stress test.  Would not want extensive calcification to cloud image sensitivity and specificity. Since she is actively having symptoms, will hopefully see an ischemic finding, but low threshold to investigate further if symptoms persist

## 2018-07-08 NOTE — Assessment & Plan Note (Signed)
Reportedly has good compliance with  her CPAP.  Certainly this is a risk factor for coronary disease and hypertension. She is being on CPAP.  Defer management to pulmonologist.

## 2018-07-08 NOTE — Assessment & Plan Note (Addendum)
Reportedly mild aortic stenosis by echo in 2017.  Murmur does some little louder now than one would expect for mild stenosis, however need to reassess. Plan: 2D echo.

## 2018-07-08 NOTE — Assessment & Plan Note (Signed)
Blood pressure is still relatively high on amlodipine and losartan.  Depending on what we see on stress test, may want to consider adding carvedilol

## 2018-07-09 ENCOUNTER — Telehealth (HOSPITAL_COMMUNITY): Payer: Self-pay | Admitting: *Deleted

## 2018-07-09 NOTE — Telephone Encounter (Signed)
Patient given detailed instructions per Myocardial Perfusion Study Information Sheet for the test on 07/11/18 at 10:15. Patient notified to arrive 15 minutes early and that it is imperative to arrive on time for appointment to keep from having the test rescheduled.  If you need to cancel or reschedule your appointment, please call the office within 24 hours of your appointment. . Patient verbalized understanding.[name] [name]

## 2018-07-11 ENCOUNTER — Ambulatory Visit (HOSPITAL_COMMUNITY): Payer: Medicare Other | Attending: Cardiovascular Disease

## 2018-07-11 ENCOUNTER — Other Ambulatory Visit: Payer: Self-pay

## 2018-07-11 ENCOUNTER — Ambulatory Visit (HOSPITAL_BASED_OUTPATIENT_CLINIC_OR_DEPARTMENT_OTHER): Payer: Medicare Other

## 2018-07-11 DIAGNOSIS — I350 Nonrheumatic aortic (valve) stenosis: Secondary | ICD-10-CM

## 2018-07-11 DIAGNOSIS — E785 Hyperlipidemia, unspecified: Secondary | ICD-10-CM | POA: Diagnosis not present

## 2018-07-11 DIAGNOSIS — J449 Chronic obstructive pulmonary disease, unspecified: Secondary | ICD-10-CM | POA: Diagnosis not present

## 2018-07-11 DIAGNOSIS — I2510 Atherosclerotic heart disease of native coronary artery without angina pectoris: Secondary | ICD-10-CM | POA: Diagnosis not present

## 2018-07-11 DIAGNOSIS — I119 Hypertensive heart disease without heart failure: Secondary | ICD-10-CM | POA: Insufficient documentation

## 2018-07-11 DIAGNOSIS — R0600 Dyspnea, unspecified: Secondary | ICD-10-CM | POA: Insufficient documentation

## 2018-07-11 DIAGNOSIS — E119 Type 2 diabetes mellitus without complications: Secondary | ICD-10-CM | POA: Diagnosis not present

## 2018-07-11 DIAGNOSIS — R42 Dizziness and giddiness: Secondary | ICD-10-CM | POA: Diagnosis not present

## 2018-07-11 DIAGNOSIS — R079 Chest pain, unspecified: Secondary | ICD-10-CM

## 2018-07-11 LAB — MYOCARDIAL PERFUSION IMAGING
CHL CUP NUCLEAR SDS: 6
LHR: 0.26
LV dias vol: 59 mL (ref 46–106)
LVSYSVOL: 20 mL
Peak HR: 109 {beats}/min
Rest HR: 68 {beats}/min
SRS: 4
SSS: 10
TID: 1.1

## 2018-07-11 MED ORDER — TECHNETIUM TC 99M TETROFOSMIN IV KIT
10.3000 | PACK | Freq: Once | INTRAVENOUS | Status: AC | PRN
  Administered 2018-07-11: 10.3 via INTRAVENOUS
  Filled 2018-07-11: qty 11

## 2018-07-11 MED ORDER — TECHNETIUM TC 99M TETROFOSMIN IV KIT
30.5000 | PACK | Freq: Once | INTRAVENOUS | Status: AC | PRN
  Administered 2018-07-11: 30.5 via INTRAVENOUS
  Filled 2018-07-11: qty 31

## 2018-07-11 MED ORDER — REGADENOSON 0.4 MG/5ML IV SOLN
0.4000 mg | Freq: Once | INTRAVENOUS | Status: AC
  Administered 2018-07-11: 0.4 mg via INTRAVENOUS

## 2018-07-11 NOTE — Progress Notes (Signed)
Stress test results: Low risk, negative stress test  ECG is normal &  Images look good.   No sign of large Vessel ischemia. Normal function.  Leonie Man, MD

## 2018-07-21 DIAGNOSIS — D508 Other iron deficiency anemias: Secondary | ICD-10-CM | POA: Diagnosis not present

## 2018-07-24 ENCOUNTER — Other Ambulatory Visit: Payer: Self-pay | Admitting: Internal Medicine

## 2018-07-24 DIAGNOSIS — N183 Chronic kidney disease, stage 3 unspecified: Secondary | ICD-10-CM

## 2018-07-30 ENCOUNTER — Ambulatory Visit
Admission: RE | Admit: 2018-07-30 | Discharge: 2018-07-30 | Disposition: A | Discharge status: Home / Self Care | Payer: Medicare Other | Source: Ambulatory Visit | Attending: Internal Medicine | Admitting: Internal Medicine

## 2018-07-30 DIAGNOSIS — N183 Chronic kidney disease, stage 3 unspecified: Secondary | ICD-10-CM

## 2018-08-04 DIAGNOSIS — N184 Chronic kidney disease, stage 4 (severe): Secondary | ICD-10-CM | POA: Diagnosis not present

## 2018-08-04 DIAGNOSIS — R319 Hematuria, unspecified: Secondary | ICD-10-CM | POA: Diagnosis not present

## 2018-08-04 DIAGNOSIS — N179 Acute kidney failure, unspecified: Secondary | ICD-10-CM | POA: Diagnosis not present

## 2018-08-04 DIAGNOSIS — D509 Iron deficiency anemia, unspecified: Secondary | ICD-10-CM | POA: Diagnosis not present

## 2018-08-04 DIAGNOSIS — R809 Proteinuria, unspecified: Secondary | ICD-10-CM | POA: Diagnosis not present

## 2018-08-04 DIAGNOSIS — Z87442 Personal history of urinary calculi: Secondary | ICD-10-CM | POA: Diagnosis not present

## 2018-08-04 DIAGNOSIS — I129 Hypertensive chronic kidney disease with stage 1 through stage 4 chronic kidney disease, or unspecified chronic kidney disease: Secondary | ICD-10-CM | POA: Diagnosis not present

## 2018-08-04 DIAGNOSIS — E1129 Type 2 diabetes mellitus with other diabetic kidney complication: Secondary | ICD-10-CM | POA: Diagnosis not present

## 2018-08-12 ENCOUNTER — Other Ambulatory Visit (HOSPITAL_COMMUNITY): Payer: Self-pay | Admitting: Internal Medicine

## 2018-08-12 DIAGNOSIS — Z6831 Body mass index (BMI) 31.0-31.9, adult: Secondary | ICD-10-CM | POA: Diagnosis not present

## 2018-08-12 DIAGNOSIS — I10 Essential (primary) hypertension: Secondary | ICD-10-CM | POA: Diagnosis not present

## 2018-08-12 DIAGNOSIS — N179 Acute kidney failure, unspecified: Secondary | ICD-10-CM

## 2018-08-12 DIAGNOSIS — D509 Iron deficiency anemia, unspecified: Secondary | ICD-10-CM | POA: Diagnosis not present

## 2018-08-12 DIAGNOSIS — R809 Proteinuria, unspecified: Secondary | ICD-10-CM

## 2018-08-12 DIAGNOSIS — N184 Chronic kidney disease, stage 4 (severe): Secondary | ICD-10-CM | POA: Diagnosis not present

## 2018-08-12 DIAGNOSIS — E1129 Type 2 diabetes mellitus with other diabetic kidney complication: Secondary | ICD-10-CM | POA: Diagnosis not present

## 2018-08-12 DIAGNOSIS — I129 Hypertensive chronic kidney disease with stage 1 through stage 4 chronic kidney disease, or unspecified chronic kidney disease: Secondary | ICD-10-CM

## 2018-08-18 ENCOUNTER — Encounter (HOSPITAL_COMMUNITY): Payer: Self-pay

## 2018-08-18 ENCOUNTER — Encounter (HOSPITAL_COMMUNITY)
Admission: RE | Admit: 2018-08-18 | Discharge: 2018-08-18 | Disposition: A | Discharge status: Home / Self Care | Payer: Medicare Other | Source: Ambulatory Visit | Attending: Internal Medicine | Admitting: Internal Medicine

## 2018-08-18 DIAGNOSIS — N179 Acute kidney failure, unspecified: Secondary | ICD-10-CM

## 2018-08-18 DIAGNOSIS — I129 Hypertensive chronic kidney disease with stage 1 through stage 4 chronic kidney disease, or unspecified chronic kidney disease: Secondary | ICD-10-CM | POA: Diagnosis not present

## 2018-08-18 DIAGNOSIS — R809 Proteinuria, unspecified: Secondary | ICD-10-CM

## 2018-08-18 IMAGING — NM NM RENAL IMAGING FLOW W/ PHARM
4 series · 14 of 14 positions shown · non-contrast
Comparison: Ultrasound [DATE]

CLINICAL DATA: Acute kidney injury.  Protein urea.  Hypertension.

EXAM:
NUCLEAR MEDICINE RENAL SCAN WITH DIURETIC ADMINISTRATION
TECHNIQUE: Radionuclide angiographic and sequential renal images were obtained
after intravenous injection of radiopharmaceutical. Imaging was
continued during slow intravenous injection of Lasix approximately
15 minutes after the start of the examination.
RADIOPHARMACEUTICALS:  5.0 mCi [LP] MAG3 IV

[Series 1: renal scan · 4.14mm/px · 6 of 90 frames shown (1 of 2)]
[frame 8/90]
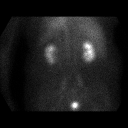
[frame 23/90]
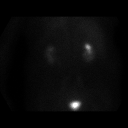
[frame 38/90]
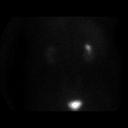
[frame 53/90]
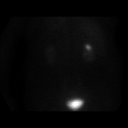
[frame 68/90]
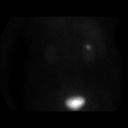
[frame 83/90]
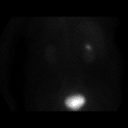

[Series 1: renal scan · 4.14mm/px · 6 of 30 frames shown (2 of 2)]
[frame 3/30]
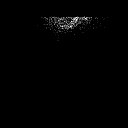
[frame 8/30  full-range]
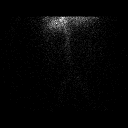
[frame 13/30  full-range]
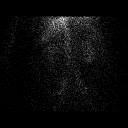
[frame 18/30  full-range]
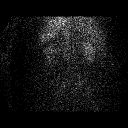
[frame 23/30  full-range]
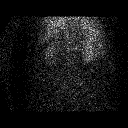
[frame 28/30  full-range]
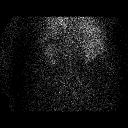

[Series 2: pre void · non-contrast · 2.07mm/px · 1 of 1 slices shown]
[im 1/1]
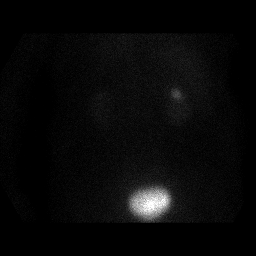

[Series 3: post void · 2.07mm/px · 1 of 1 slices shown]
[im 1/1]
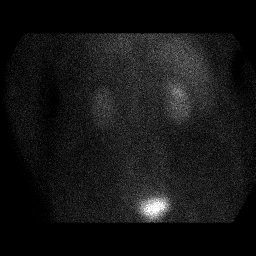

[14 of 14 positions shown; findings below may reference images not displayed]

FINDINGS: Flow: There is delayed perfusion and cortical uptake by the left
kidney..

Left renogram: Delayed and diminished cortical uptake by the left
kidney noted. There is significantly diminished excretion of the
radiopharmaceutical.

Right renogram: Relative normal cortical uptake by the right kidney.
Mildly delayed excretion of the radiopharmaceutical. Radiotracer
accumulation within the upper pole collecting system of the right
kidney is noted with diminished clearance.

Differential:

Left kidney = 43 %

Right kidney = 57 %

T1/2 post Lasix :

Left kidney = never achieved min

Right kidney = 15.4 min
IMPRESSION: 1. Significantly diminished left kidney function with delayed and
diminished perfusion and cortical uptake. Significantly diminished
excretion of the radiopharmaceutical also noted. Insufficient
excretion of contrast material to evaluate clearance.
2. Relative normal perfusion and cortical uptake by the right
kidney. There is delayed clearance of the radiopharmaceutical from
the upper pole collecting system.
3. Split renal function is equal to 57% from the right kidney and
43% from the left kidney.

## 2018-08-18 MED ORDER — FUROSEMIDE 10 MG/ML IJ SOLN
INTRAMUSCULAR | Status: AC
  Administered 2018-08-18: 40 mg via INTRAVENOUS
  Filled 2018-08-18: qty 4

## 2018-08-18 MED ORDER — TECHNETIUM TC 99M MERTIATIDE
5.0000 | Freq: Once | INTRAVENOUS | Status: AC | PRN
  Administered 2018-08-18: 5 via INTRAVENOUS

## 2018-08-18 MED ORDER — FUROSEMIDE 10 MG/ML IJ SOLN
40.0000 mg | Freq: Once | INTRAMUSCULAR | Status: AC
  Administered 2018-08-18: 40 mg via INTRAVENOUS

## 2018-08-28 DIAGNOSIS — N179 Acute kidney failure, unspecified: Secondary | ICD-10-CM | POA: Diagnosis not present

## 2018-09-02 DIAGNOSIS — N184 Chronic kidney disease, stage 4 (severe): Secondary | ICD-10-CM | POA: Diagnosis not present

## 2018-09-02 DIAGNOSIS — E1122 Type 2 diabetes mellitus with diabetic chronic kidney disease: Secondary | ICD-10-CM | POA: Diagnosis not present

## 2018-09-02 DIAGNOSIS — I129 Hypertensive chronic kidney disease with stage 1 through stage 4 chronic kidney disease, or unspecified chronic kidney disease: Secondary | ICD-10-CM | POA: Diagnosis not present

## 2018-09-02 DIAGNOSIS — R809 Proteinuria, unspecified: Secondary | ICD-10-CM | POA: Diagnosis not present

## 2018-09-02 DIAGNOSIS — Z87442 Personal history of urinary calculi: Secondary | ICD-10-CM | POA: Diagnosis not present

## 2018-09-08 ENCOUNTER — Ambulatory Visit (INDEPENDENT_AMBULATORY_CARE_PROVIDER_SITE_OTHER): Payer: Medicare Other | Admitting: Cardiology

## 2018-09-08 ENCOUNTER — Encounter: Payer: Self-pay | Admitting: Cardiology

## 2018-09-08 VITALS — BP 130/72 | HR 72 | Ht 63.0 in | Wt 183.2 lb

## 2018-09-08 DIAGNOSIS — I350 Nonrheumatic aortic (valve) stenosis: Secondary | ICD-10-CM

## 2018-09-08 DIAGNOSIS — R0609 Other forms of dyspnea: Secondary | ICD-10-CM | POA: Diagnosis not present

## 2018-09-08 DIAGNOSIS — R0600 Dyspnea, unspecified: Secondary | ICD-10-CM

## 2018-09-08 DIAGNOSIS — I2510 Atherosclerotic heart disease of native coronary artery without angina pectoris: Secondary | ICD-10-CM | POA: Diagnosis not present

## 2018-09-08 DIAGNOSIS — R079 Chest pain, unspecified: Secondary | ICD-10-CM | POA: Diagnosis not present

## 2018-09-08 NOTE — Patient Instructions (Signed)
NO MEDICATIONS CHANGES     Your physician wants you to follow-up in [place].  You will receive a reminder letter in the mail two months in advance. If you don't receive a letter, please call our office to schedule the follow-up appointment.    If you need a refill on your cardiac medications before your next appointment, please call your pharmacy.

## 2018-09-08 NOTE — Progress Notes (Signed)
PCP: Marton Redwood, MD  Pulmonologist: Dr. Annamaria Boots  Clinic Note: Chief Complaint  Patient presents with  . Follow-up    Overall feeling better.  . Chest Pain    Myoview results  . Shortness of Breath    Echo results    HPI: [name] [name]" is a 77 y.o. female - former bank manager/VP who is being seen today for a FOLLOW-UP visit to discuss test restults. CRFs: OSA, HTN, HLD,  -- She has OSA - recently changed to CPAP, and ? Bicuspid Aortic Valve with Coronary calcification noted on CT chest.  She was initially seen on June 30, 2018 at the request of Dr. Marton Redwood for evaluation.evaluation of chest pain/tightness occasionally walking up stairs, ha)s "plaque build up".   Clinic visit with Dr. Brigitte Pulse on June 17, 2018 --> she noted having mild chest discomfort when climbing stairs along with dyspnea.  Improved with rest.  Episodes are not prolonged, and always relieved with rest.  Recent Hospitalizations:   none  Studies Personally Reviewed - (if available, images/films reviewed: From Epic Chart or Care Everywhere)    Myoview 07/11/18:  EF > 65%. No Ischemia or Infarction. Normal Study  TTE 07/11/18: EF 55-65%. Gr 1 DD. Mild AS (? Functionally bicuspid AoV with moderate leaflet thickening) - Mean Gradient 15 mmHg, Peak 32 mmHg  Interval History: "[name] [name]" presents for follow-up evaluation indicating that she really feels better overall.  She has had much less of the chest discomfort.  She has minimal episodes now.  She still has some exertional dyspnea but given that is improved. She pretty much is deconditioned and has not done anything since retirement and therefore has exertional dyspnea but does not feel lazy or fatigued.  Denies any PND, orthopnea or edema. No rapid irregular heartbeats or palpitations.  No syncope/near syncope or TIA/amaurosis fugax.  She is been dealing a lot with nephrolithiasis of late. No PND, orthopnea or edema.  No melena, hematochezia, hematuria,  or epstaxis. No claudication.  ROS: A comprehensive was performed. Review of Systems  Constitutional: Positive for malaise/fatigue (all the time; both motivating & with activity -since retirement). Negative for chills and fever.  HENT: Positive for congestion (drainiage). Negative for nosebleeds.   Respiratory: Positive for cough (morning cough) and shortness of breath (exertional -but seems to be improving). Negative for sputum production and wheezing.   Cardiovascular: Positive for orthopnea and PND (maybe - but hard to tell with OSA).  Gastrointestinal: Negative for abdominal pain, blood in stool and melena.  Genitourinary: Negative for hematuria.  Musculoskeletal: Negative for falls (almost fall - (has a fear of falling) ).  Skin: Negative for itching (les - sees a dermatologist).  Neurological: Negative for weakness.  Endo/Heme/Allergies: Positive for environmental allergies. Does not bruise/bleed easily.  Psychiatric/Behavioral: Negative for memory loss. The patient is not nervous/anxious and does not have insomnia.   All other systems reviewed and are negative.  I have reviewed and (if needed) personally updated the patient's problem list, medications, allergies, past medical and surgical history, social and family history.   Past Medical History:  Diagnosis Date  . Aortic valve stenosis, mild    per echo 02-13-2016  possible bicuspid w/ moderate thickened and calicified ,  valve area 1.34cm^2    pt denies  . COPD (chronic obstructive pulmonary disease) ([place])   . GERD (gastroesophageal reflux disease)    no problems since started on CPAP  . Hematuria   . History of adenomatous polyp of colon  tubular adnenoma's and hyperplastic polyp's 05-19-2002;  08-15-2010;  2015  . History of kidney stones    multiple since 1970's  . History of squamous cell carcinoma excision    05-14-2012  right arm;  04-18-2015  left ankle  . HTN (hypertension)   . Hyperlipidemia   . Iron  deficiency anemia   . Left ureteral stone   . OSA on CPAP    per study 10-13-2006 mild to moderate osa w/ hypersomnia  . Seasonal and perennial allergic rhinitis   . Type 2 diabetes mellitus (HCC)    type 2  . Urgency of urination     Past Surgical History:  Procedure Laterality Date  . CARPAL TUNNEL RELEASE Bilateral 2003  . COLONOSCOPY  last one 2015  . CT CHEST WITH CONTRAST   ([place] [facility])     Advanced aortic and branch vessel atherosclerosis. Tortuous thoracic aorta. Normal heart size, without pericardial effusion. Multivessel coronary artery atherosclerosis  . CYSTOSCOPY WITH RETROGRADE PYELOGRAM, URETEROSCOPY AND STENT PLACEMENT Left 04/15/2017   Procedure: CYSTOSCOPY WITH LEFT  RETROGRADE PYELOGRAM, URETEROSCOPYwith stone basketry;  Surgeon: Kathie Rhodes, MD;  Location: [facility];  Service: Urology;  Laterality: Left;  . D & C HYSTEROSCOPY W/ RESECTION FIBROID AND POLYP  07/30/2000  . DOBUTAMINE STRESS ECHO  01-30-2008   [facility]   no ischemia/  normal wall motion/  post stress ef 79%  . ESOPHAGOGASTRODUODENOSCOPY (EGD) WITH PROPOFOL N/A 01/28/2018   Procedure: ESOPHAGOGASTRODUODENOSCOPY (EGD) WITH PROPOFOL;  Surgeon: Clarene Essex, MD;  Location: [facility];  Service: Endoscopy;  Laterality: N/A;  . [place]   trauma   . HOT HEMOSTASIS N/A 01/28/2018   Procedure: HOT HEMOSTASIS (ARGON PLASMA COAGULATION/BICAP);  Surgeon: Clarene Essex, MD;  Location: [facility];  Service: Endoscopy;  Laterality: N/A;  . LAPAROSCOPIC CHOLECYSTECTOMY  08/20/2001   w/ ERCP spincterotomy w/ balloon  . TRANSTHORACIC ECHOCARDIOGRAM  02/13/2016   LVEF 55-60%. Gr 1 DD. ? possible bicuspid AoV w/ moderate thickened / calcified AoV w/ mild Stenosis (Mean Gradietn 15 mmHg, Valve area 1.34cm^2), no regurg.    . TRANSTHORACIC ECHOCARDIOGRAM  06/2018   EF 55-65%. Gr 1 DD. Mild AS (? Functionally bicuspid AoV with moderate leaflet thickening) - Mean Gradient 15 mmHg, Peak 32 mmHg    . TUBAL LIGATION Bilateral yrs ago    Current Meds  Medication Sig  . albuterol (VENTOLIN HFA) 108 (90 Base) MCG/ACT inhaler Inhale 2 puffs into the lungs every 6 (six) hours as needed for wheezing or shortness of breath.  Marland Kitchen amLODipine (NORVASC) 10 MG tablet Take 10 mg by mouth daily.   Marland Kitchen escitalopram (LEXAPRO) 10 MG tablet Take 10 mg by mouth every morning.   Vanessa Kick Ethyl (VASCEPA) 1 g CAPS Take 2 capsules by mouth 2 (two) times daily.  Marland Kitchen levocetirizine (XYZAL) 5 MG tablet Take 5 mg by mouth every evening.  . linagliptin (TRADJENTA) 5 MG TABS tablet Take 5 mg by mouth daily.  . Melatonin 5 MG TABS Take 1 tablet by mouth at bedtime as needed (sleep).  . simvastatin (ZOCOR) 20 MG tablet Take 20 mg by mouth daily.  Marland Kitchen triamcinolone ointment (KENALOG) 0.1 % Apply 1 application topically 2 (two) times daily.  . Vitamin D, Ergocalciferol, (DRISDOL) 50000 units CAPS capsule Take 50,000 Units by mouth every 7 (seven) days. friday's    No Known Allergies  Social History   Tobacco Use  . Smoking status: Former Smoker    Packs/day: 0.25  Years: 50.00    Pack years: 12.50    Types: Cigarettes    Last attempt to quit: 12/01/2015    Years since quitting: 2.7  . Smokeless tobacco: Never Used  Substance Use Topics  . Alcohol use: Yes    Alcohol/week: 0.0 standard drinks    Comment: rare  . 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   Caffeine: 1 drink/day exercise routine to because of back pain.    family history includes Alzheimer's disease in her maternal grandmother and mother; Brain cancer in her paternal grandmother; Colon cancer in her paternal aunt, paternal grandfather, paternal uncle, and paternal uncle; Heart attack in her maternal  uncle and paternal grandfather; Heart disease in her maternal grandmother and paternal grandmother; Hyperlipidemia in her brother; Hypertension in her brother; Lung cancer in her maternal uncle.  Wt Readings from Last 3 Encounters:  09/08/18 183 lb 3.2 oz (83.1 kg)  07/11/18 177 lb (80.3 kg)  06/30/18 177 lb 3.2 oz (80.4 kg)    PHYSICAL EXAM BP 130/72   Pulse 72   Ht 5\' 3"  (1.6 m)   Wt 183 lb 3.2 oz (83.1 kg)   SpO2 96%   BMI 32.45 kg/m  Physical Exam  Constitutional: She is oriented to person, place, and time. She appears well-developed and well-nourished. No distress.  Healthy-appearing.  Well-groomed  HENT:  Head: Normocephalic and atraumatic.  Neck: Normal range of motion. Neck supple. No hepatojugular reflux and no JVD present. Carotid bruit is not present.  Cardiovascular: Normal rate, regular rhythm and normal pulses.  No extrasystoles are present. PMI is not displaced.  Murmur heard.  Harsh crescendo-decrescendo midsystolic murmur is present with a grade of 2/6 at the upper right sternal border radiating to the neck. Pulmonary/Chest: Effort normal. No respiratory distress. She has no wheezes. She has no rales. She exhibits no tenderness.  Distant breath sounds with increased AP diameter.  Abdominal: Soft. Bowel sounds are normal. She exhibits no distension. There is no tenderness. There is no rebound.  No HSM  Musculoskeletal: Normal range of motion. She exhibits edema (trivial).  Neurological: She is alert and oriented to person, place, and time. No cranial nerve deficit.  Psychiatric: She has a normal mood and affect. Her behavior is normal. Judgment and thought content normal.  Vitals reviewed.   Adult ECG Report n/a  Other studies Reviewed: Additional studies/ records that were reviewed today include:  Recent Labs: June 17, 2018  Na+ 145, K+ 4.9, Cl- 109, HCO3- 21, BUN 18, Cr 2.0, Glu 107, Ca2+ 10.2; AST 16, ALT 14, AlkP 66  CBC: W 11.4, H/H 11.0/35.1, Plt  343  TC 173, TG 315, HDL 38, LDL 56   ASSESSMENT / PLAN: Problem List Items Addressed This Visit    Chest pain with moderate risk for cardiac etiology    Nonischemic Myoview Aortic valve is not yet stenosed enough to be systematic.      Coronary artery calcification seen on computed tomography (Chronic)    She does have evidence of coronary artery calcification but nonischemic Myoview which would argue against significant disease.  Symptoms seem to be improving which would go against this being CAD. She is on amlodipine  and statin.  Suggested aspirin.      Dyspnea (Chronic)    Evaluated with Myoview that was negative for ischemia with normal EF.  Echo also showed normal EF with only mild aortic stenosis.  Nothing to explain dyspnea. Suspect it could be a component of diastolic dysfunction.  Would potentially consider ACE inhibitor or ARB for diabetic.      Mild aortic stenosis by prior echocardiogram - Primary (Chronic)    Mild aortic stenosis stable from 27-20 19.  We will recheck echo in 2 years.         I spent a total of 25 minutes with the patient and chart review. >  50% of the time was spent in direct patient consultation.   Current medicines are reviewed at length with the patient today.  (+/- concerns) n/a The following changes have been made:  n/a  Patient Instructions  NO MEDICATIONS CHANGES     Your physician wants you to follow-up in [place].  You will receive a reminder letter in the mail two months in advance. If you don't receive a letter, please call our office to schedule the follow-up appointment.    If you need a refill on your cardiac medications before your next appointment, please call your pharmacy.       Studies Ordered:   No orders of the defined types were placed in this encounter.     Glenetta Hew, [name] [name]., M.S. Interventional Cardiologist   Pager # [phone] Phone # [phone] [address]. [place], [place] [address]   Thank you for choosing [facility] at [facility]!!

## 2018-09-10 ENCOUNTER — Encounter: Payer: Self-pay | Admitting: Cardiology

## 2018-09-10 NOTE — Assessment & Plan Note (Signed)
Evaluated with Myoview that was negative for ischemia with normal EF.  Echo also showed normal EF with only mild aortic stenosis.  Nothing to explain dyspnea. Suspect it could be a component of diastolic dysfunction.  Would potentially consider ACE inhibitor or ARB for diabetic.

## 2018-09-10 NOTE — Assessment & Plan Note (Signed)
Nonischemic Myoview Aortic valve is not yet stenosed enough to be systematic.

## 2018-09-10 NOTE — Assessment & Plan Note (Signed)
Mild aortic stenosis stable from 27-20 19.  We will recheck echo in 2 years.

## 2018-09-10 NOTE — Assessment & Plan Note (Signed)
She does have evidence of coronary artery calcification but nonischemic Myoview which would argue against significant disease.  Symptoms seem to be improving which would go against this being CAD. She is on amlodipine and statin.  Suggested aspirin.

## 2018-09-12 ENCOUNTER — Other Ambulatory Visit: Payer: Self-pay | Admitting: Surgery

## 2018-09-12 DIAGNOSIS — E042 Nontoxic multinodular goiter: Secondary | ICD-10-CM

## 2018-09-13 ENCOUNTER — Encounter: Payer: Self-pay | Admitting: Internal Medicine

## 2018-09-15 ENCOUNTER — Encounter: Payer: Self-pay | Admitting: Internal Medicine

## 2018-09-15 ENCOUNTER — Ambulatory Visit (INDEPENDENT_AMBULATORY_CARE_PROVIDER_SITE_OTHER): Payer: Medicare Other | Admitting: Internal Medicine

## 2018-09-15 DIAGNOSIS — I2510 Atherosclerotic heart disease of native coronary artery without angina pectoris: Secondary | ICD-10-CM | POA: Diagnosis not present

## 2018-09-15 DIAGNOSIS — J449 Chronic obstructive pulmonary disease, unspecified: Secondary | ICD-10-CM | POA: Diagnosis not present

## 2018-09-15 DIAGNOSIS — G4733 Obstructive sleep apnea (adult) (pediatric): Secondary | ICD-10-CM | POA: Diagnosis not present

## 2018-09-15 NOTE — Progress Notes (Signed)
Patient ID: [name] [name], female    DOB: 02-28-1941, 77 y.o.   MRN: [id number]  HPI F former smoker- Followed for allergic rhinitis, hypersomnia w/ OSA, COPD, complicated by DM, HBP, GERD NPSG 10/10/06 RDI/AHI 15.6/hr PFT 12/09/2015-moderate obstruction, diffusion severely reduced.  FVC 1.6/59%, FEV1 1.18/58%, ratio 0.74, TLC 81%, DLCO 42%  ----------------------------------------------------------------------------  09/27/2016-77 year old female former smoker followed for allergic rhinitis, OSA, complicated by DM, HBP, GERD CPAP 8/Advanced FOLLOWS FOR:DME: [facility]. Pt states she wears CPAP every night and DL attached.  Download-93% 4 hour compliance with excellent control, AHI 1.1/hour She sleeps very well with CPAP and feels well rested but still takes a 15 minute nap most afternoons. Pressure is comfortable. Had flu shot.  09/15/2018-77 year old female former smoker followed for OSA, COPD, allergic rhinitis, complicated by DM 2, HBP, GERD HBP, CPAP 8/Advanced -----OSA; DME: [facility]. Pt wears CPAP nightly and DL attached. No new supplies needed.  Anoro Ellipta, albuterol HFA, Download compliance 97%, AHI 1.0/hour.  We discussed mask comfort and long-term use issues.  She has no complaints or changes to request.  Has had flu shot  Review of Systems- see HPI    + = positive Constitutional:   No-   weight loss, night sweats, fevers, chills, fatigue, lassitude. HEENT:   No-   headaches, difficulty swallowing, tooth/dental problems, sore throat,                  No-   sneezing, itching, ear ache, no-nasal congestion, post nasal drip,  CV:  No-   chest pain, orthopnea, PND, swelling in lower extremities, anasarca, dizziness, palpitations GI:  No-   heartburn, indigestion, abdominal pain, nausea, vomiting,  Resp:   No-  excess mucus,             No-   productive cough,  No non-productive cough,  No-  coughing up of blood.              No-   change in color of mucus.  No- wheezing.   Skin: No-    rash or lesions. GU:  MS:  No-   joint pain or swelling.  Psych:  No- change in mood or affect. No depression or anxiety.  No memory loss.   Objective:   Physical Exam General- Alert, Oriented, Affect-appropriate, Distress- none acute, +overweight Skin- rash-none, lesions- none, excoriation- none Lymphadenopathy- none Head- atraumatic            Eyes- Gross vision intact, PERRLA, conjunctivae clear secretions            Ears- Hearing, canals-normal            Nose- Clear, no-Septal dev, mucus, polyps, erosion, perforation             Throat- Mallampati III-IV , mucosa-not unusually dry , drainage- none,  tonsils- atrophic Neck- flexible , trachea midline, no stridor , thyroid nl, carotid no bruit Chest - symmetrical excursion , unlabored           Heart/CV- RRR , no murmur , no gallop  , no rub, nl s1 s2                           - JVD- none , edema- none, stasis changes- none, varices- none           Lung- + clear, unlabored, no-wheezing,cough + light dullness-none, rub- none           Chest wall-  Abd-  Br/ Gen/ Rectal- Not done, not indicated Extrem- cyanosis- none, clubbing, none, atrophy- none, strength- nl Neuro- grossly intact to observation

## 2018-09-15 NOTE — Patient Instructions (Signed)
We can continue CPAP 8, mask of choice, humidifier, supplies, [facility]  Please call if we can help

## 2018-09-16 DIAGNOSIS — J449 Chronic obstructive pulmonary disease, unspecified: Secondary | ICD-10-CM | POA: Insufficient documentation

## 2018-09-16 NOTE — Assessment & Plan Note (Signed)
Moderate COPD based on PFT 2016.  Only an incidental light cough noted on exam at this visit.  No acute process.  Has already had flu shot. Plan-continue Anoro and albuterol HFA

## 2018-09-16 NOTE — Assessment & Plan Note (Signed)
She feels comfortable with her CPAP and feels she sleeps better with it.  No particular concerns or changes to request.  Download confirms. Plan-continue CPAP 8

## 2018-09-22 ENCOUNTER — Ambulatory Visit
Admission: RE | Admit: 2018-09-22 | Discharge: 2018-09-22 | Disposition: A | Discharge status: Home / Self Care | Payer: Medicare Other | Source: Ambulatory Visit | Attending: Surgery | Admitting: Surgery

## 2018-09-22 DIAGNOSIS — E042 Nontoxic multinodular goiter: Secondary | ICD-10-CM

## 2018-09-22 DIAGNOSIS — E010 Iodine-deficiency related diffuse (endemic) goiter: Secondary | ICD-10-CM | POA: Diagnosis not present

## 2018-09-24 DIAGNOSIS — E042 Nontoxic multinodular goiter: Secondary | ICD-10-CM | POA: Diagnosis not present

## 2018-09-25 IMAGING — US US RENAL
1 series · 14 of 25 positions shown · non-contrast
Comparison: CT abdomen and pelvis [DATE]

CLINICAL DATA: Stage III chronic kidney disease

EXAM:
RENAL / URINARY TRACT ULTRASOUND COMPLETE

[Series 1: us renal · 0.26mm/px · 14 of 44 slices shown]
[im 1/44]
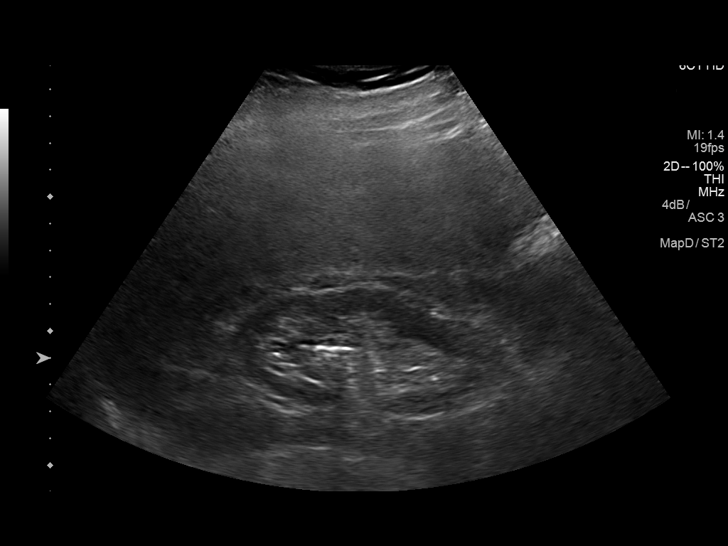
[im 4/44]
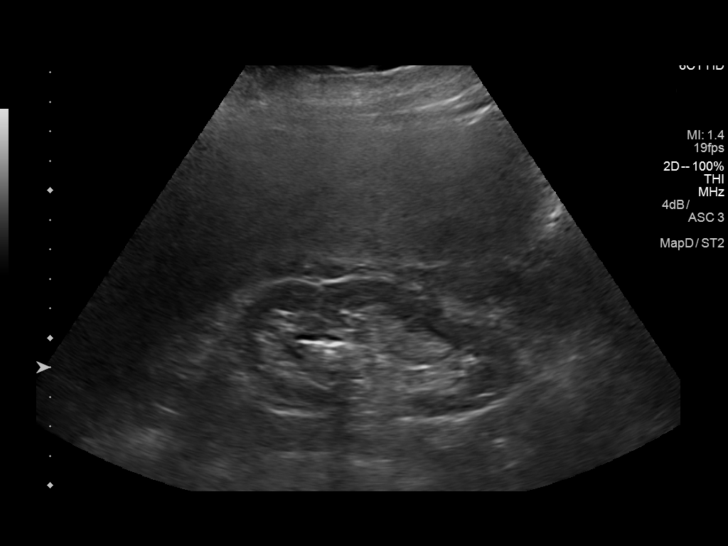
[im 8/44]
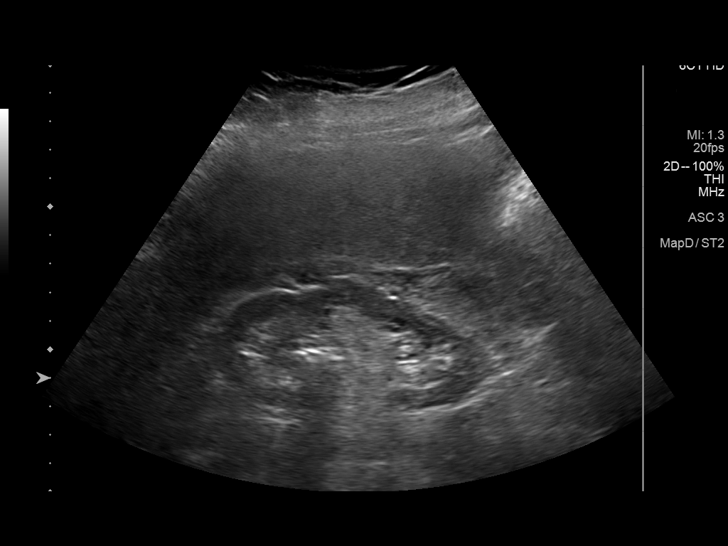
[im 11/44]
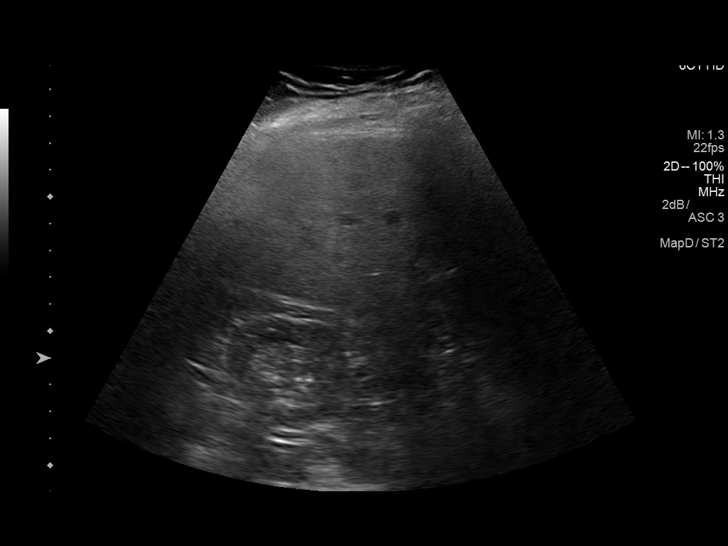
[im 15/44]
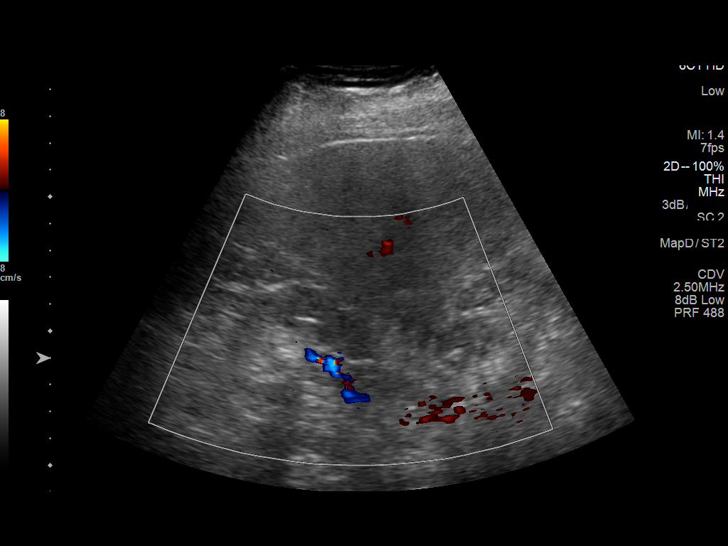
[im 17/44]
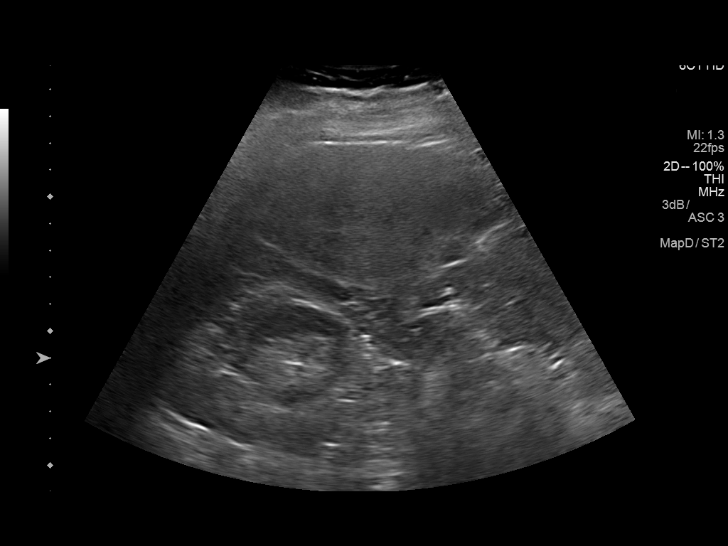
[im 20/44]
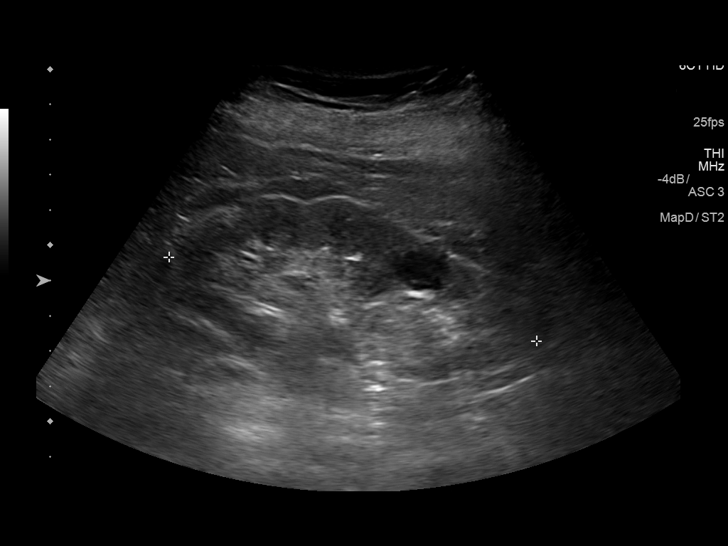
[im 24/44]
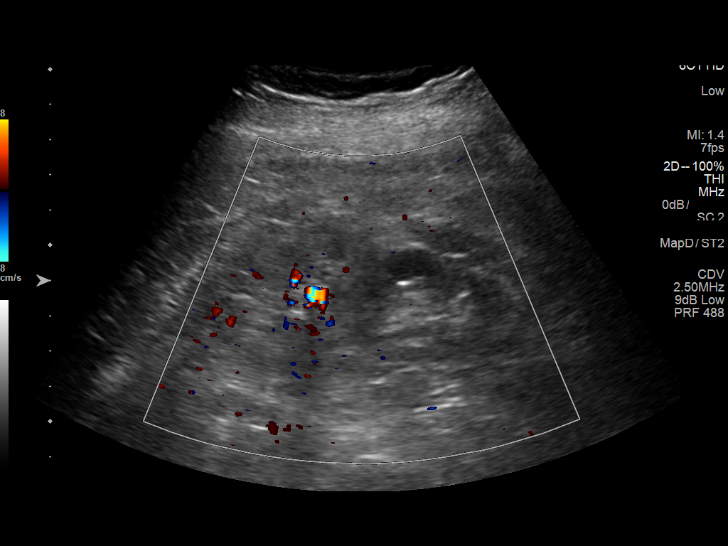
[im 27/44]
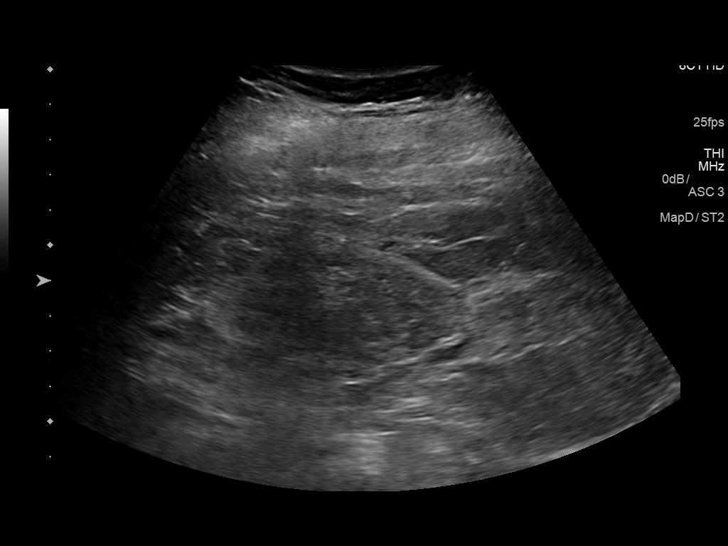
[im 29/44]
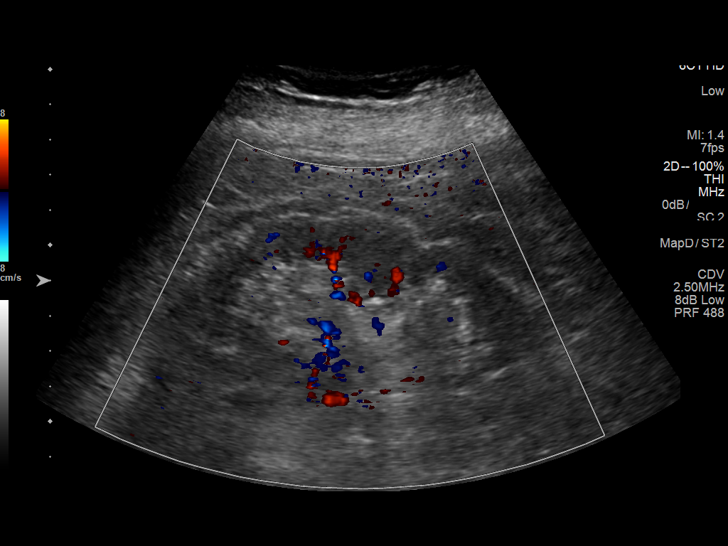
[im 33/44]
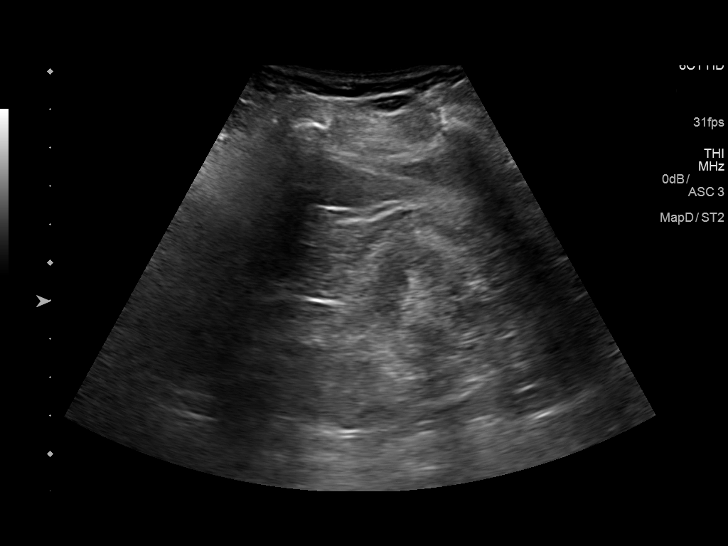
[im 36/44]
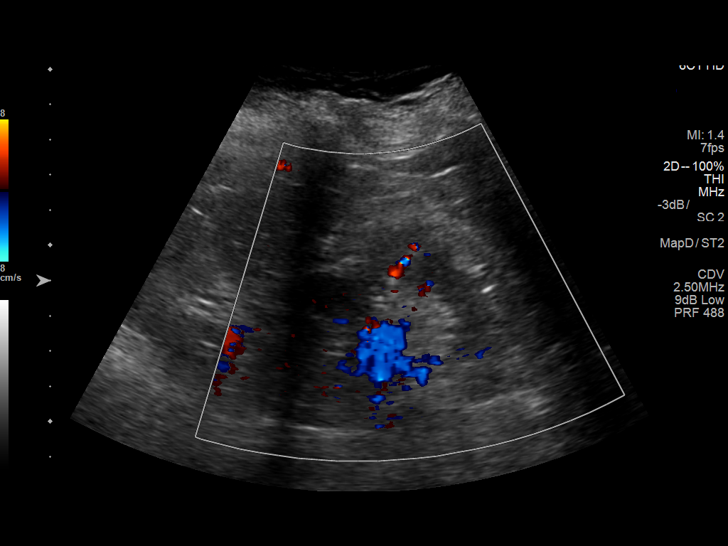
[im 40/44]
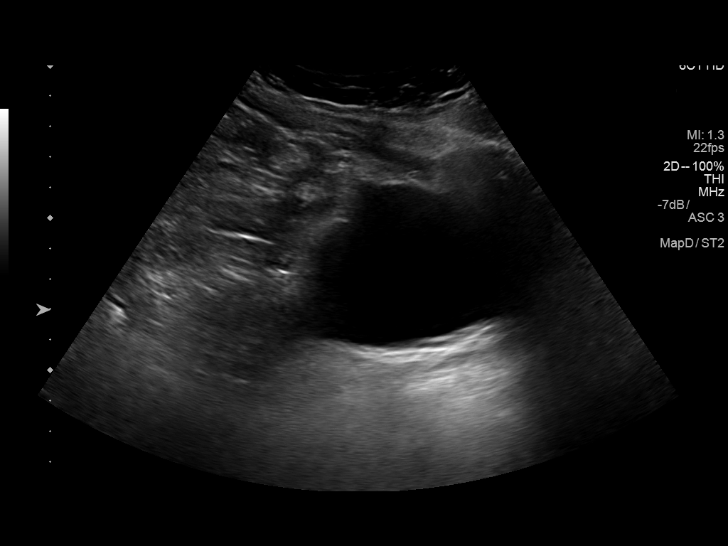
[im 44/44]
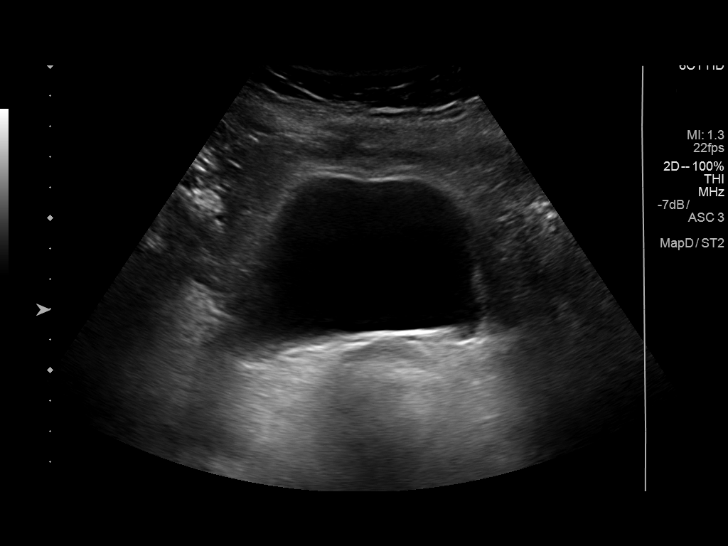

[14 of 25 positions shown; findings below may reference images not displayed]

FINDINGS: Right Kidney:

Length: 10.0 cm. Cortical thinning. Normal cortical echogenicity.
Shadowing echogenic foci up to 16 mm diameter consistent with
calculi, corresponding to calculi seen on prior CT. No
hydronephrosis or obvious renal mass.

Left Kidney:

Length: 10.7 cm. Cortical thinning. Normal cortical echogenicity.
Mildly complicated cyst at inferior pole 16 x 13 x 15 mm increased
from the 12 x 11 x 9 mm on prior CT. Additional tiny cystic lesion
containing a few internal echoes at mid kidney 13 x 10 x 15 mm,
previously 8 x 8 x 9 mm.

Bladder:

Appears normal for degree of bladder distention. LEFT ureteral jet
was visualized but RIGHT ureteral jet was not seen during the period
of imaging.
IMPRESSION: Renal cortical atrophy bilaterally.

Small mildly complicated LEFT renal cysts slightly increased in
sizes since [DATE].

## 2018-10-04 DIAGNOSIS — Z23 Encounter for immunization: Secondary | ICD-10-CM | POA: Diagnosis not present

## 2018-11-18 IMAGING — US US THYROID
1 series · 12 of 25 positions shown · non-contrast
Comparison: [DATE] and previous

CLINICAL DATA: Nodules. Previous FNA biopsy mid isthmic and left
upper nodules [DATE]. Previous FNA biopsy mid/inferior right
nodule [DATE] and [DATE]

EXAM:
THYROID ULTRASOUND
TECHNIQUE: Ultrasound examination of the thyroid gland and adjacent soft
tissues was performed.

[Series 1: us thyroid · 0.06mm/px · 12 of 70 slices shown]
[im 3/70]
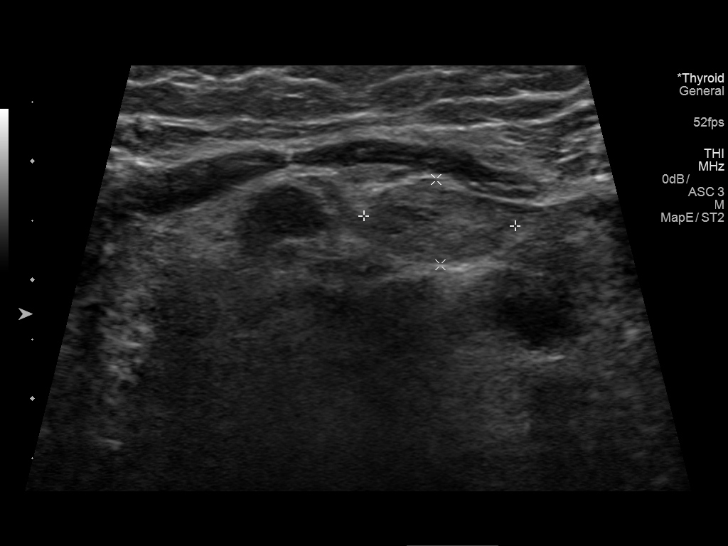
[im 9/70]
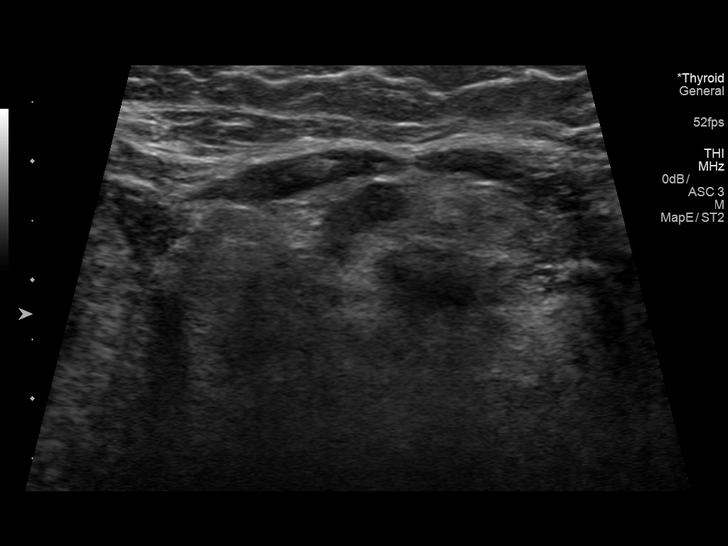
[im 15/70]
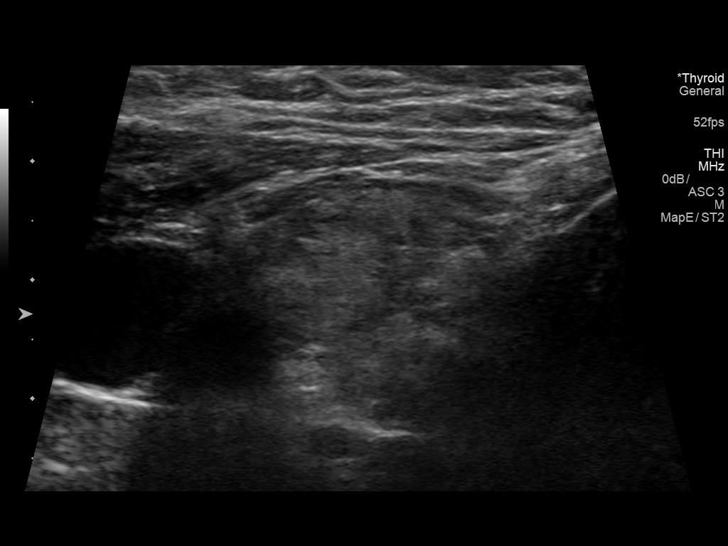
[im 21/70]
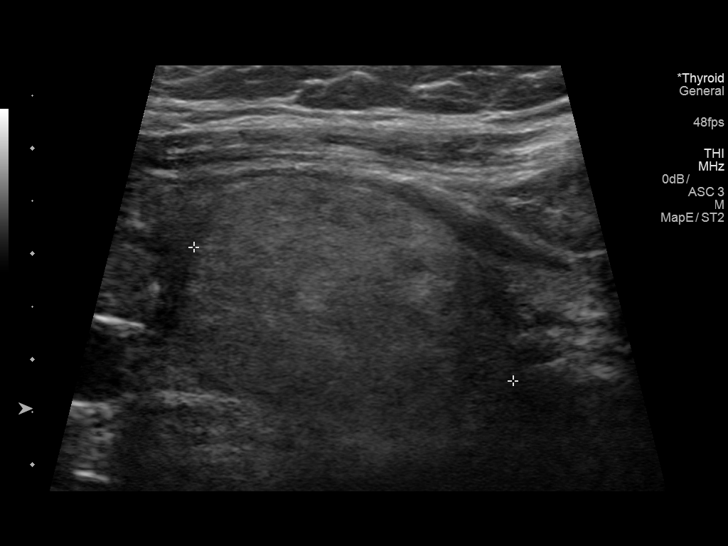
[im 26/70]
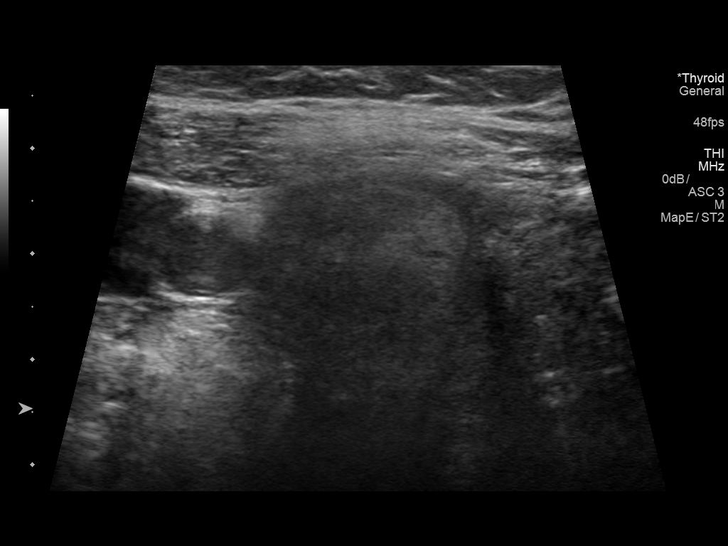
[im 32/70]
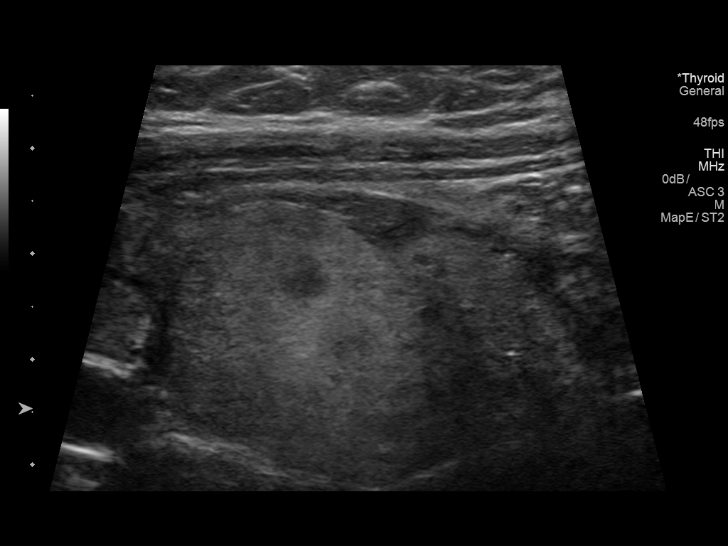
[im 38/70]
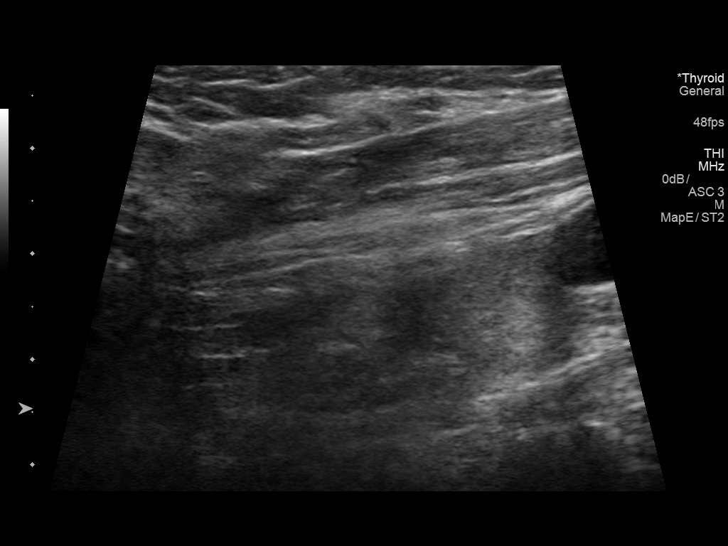
[im 44/70]
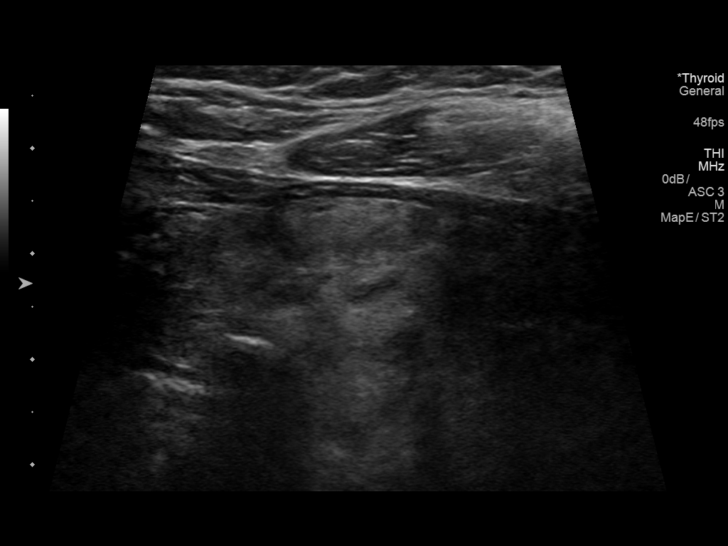
[im 49/70]
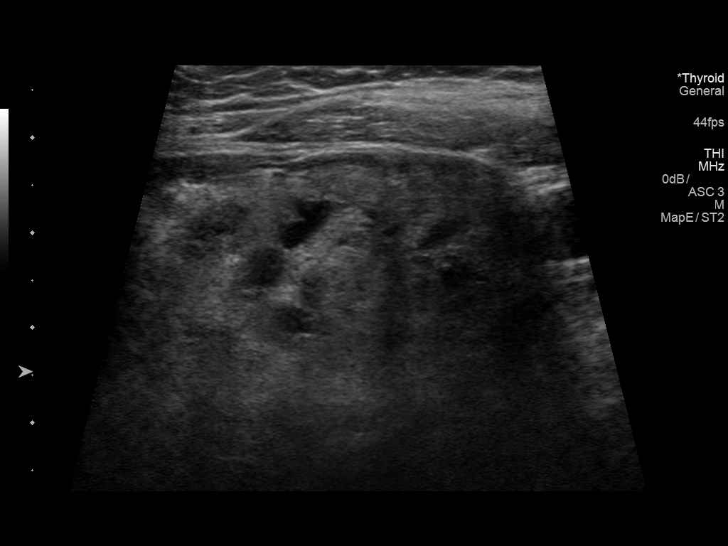
[im 55/70]
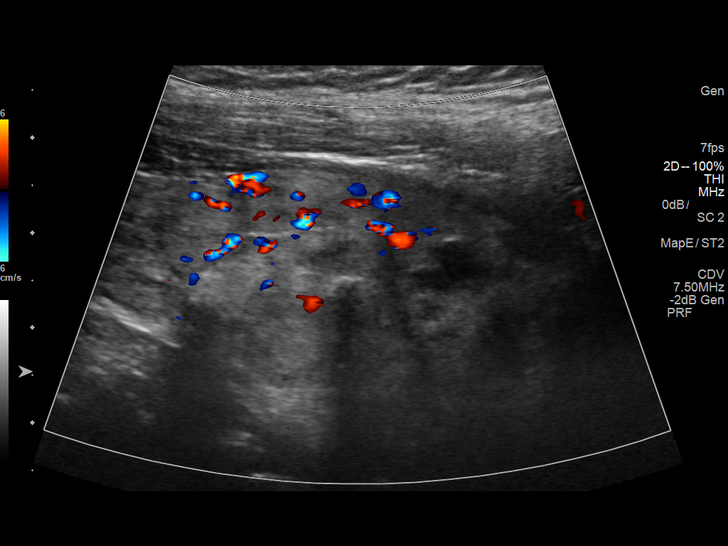
[im 61/70]
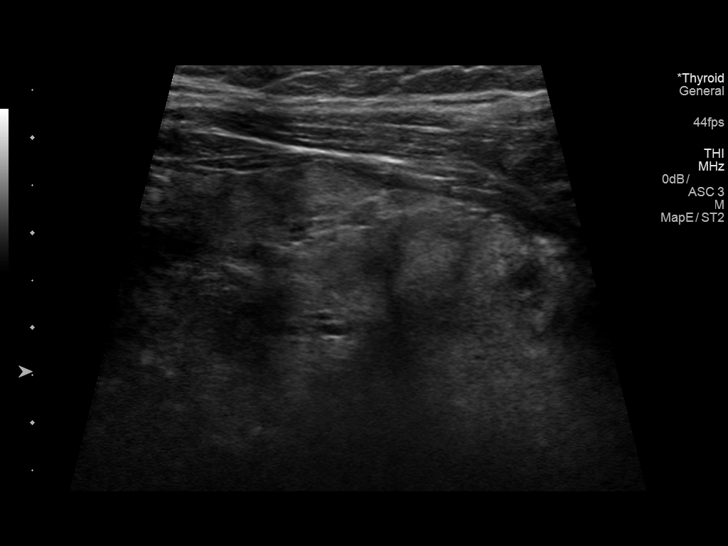
[im 67/70]
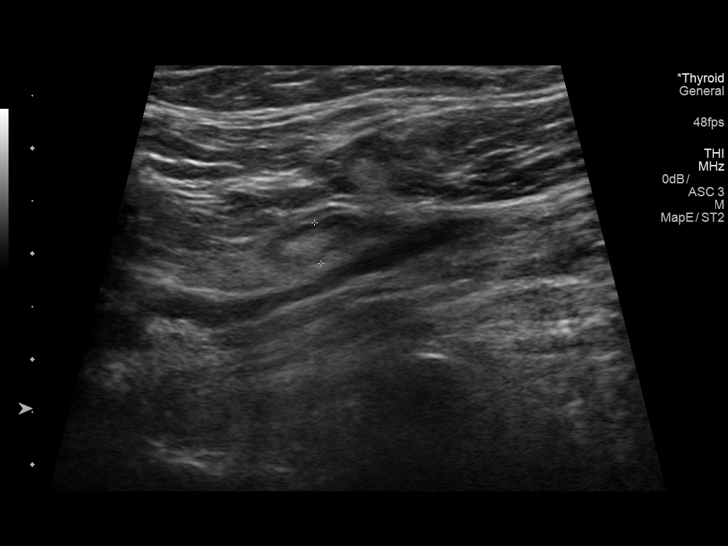

[12 of 25 positions shown; findings below may reference images not displayed]

FINDINGS: Parenchymal Echotexture: Markedly heterogenous

Isthmus: 1 cm thickness, previously

Right lobe: 5.2 x 2.8 x 3 cm, previously 5.3 x 2.7 x

Left lobe: 5.3 x 2.9 x 2.6, previously 6.2 x 2.7 x 3

_________________________________________________________

Estimated total number of nodules >/= 1 cm: 5

Number of spongiform nodules >/=  2 cm not described below (TR1): 0

Number of mixed cystic and solid nodules >/= 1.5 cm not described
below (TR2): 0

_________________________________________________________

Nodule # 1: 1.1 x 0.7 x 1.3 cm isthmic nodule, previously 2.6 x
x 1.5; this was previously biopsied

Nodule # 2:

Prior biopsy: No

Location: Right; Superior

Maximum size: 1.5 cm; Other 2 dimensions: 1.2 x 1.4 cm, previously,
1.3 x 1.4 x 1.2 cm

Composition: solid/almost completely solid (2)

Echogenicity: isoechoic (1)

Shape: not taller-than-wide (0)

Margins: ill-defined (0)

Echogenic foci: none (0)

ACR TI-RADS total points: 3.

ACR TI-RADS risk category:  TR3 (3 points).

Significant change in size (>/= 20% in two dimensions and minimal
increase of 2 mm): No

Change in features: No

Change in ACR TI-RADS risk category: No

ACR TI-RADS recommendations:

*Given size (>/= 1.5 - 2.4 cm) and appearance, a follow-up
ultrasound in 1 year should be considered based on TI-RADS criteria.

_________________________________________________________

Nodule # 3: 3.3 x 2.6 x 2.3 cm mid right, previously 3.2 x 2.3 x
2.5; this was previously biopsied

Nodule # 4: Described previously on the left, not discretely
identified on the current examination

Nodule # 5:

Prior biopsy: No

Location: Left; Inferior

Maximum size: 3.7 cm; Other 2 dimensions: 2.5 x 3.2 cm, previously,
3.6 x 3.4 x 2.8 cm

Composition: mixed cystic and solid (1)

Echogenicity: isoechoic (1)

Shape: not taller-than-wide (0)

Margins: ill-defined (0)

Echogenic foci: none (0)

ACR TI-RADS total points: 2.

ACR TI-RADS risk category:  TR2 (2 points).

Significant change in size (>/= 20% in two dimensions and minimal
increase of 2 mm): No

Change in features: No

Change in ACR TI-RADS risk category: No

ACR TI-RADS recommendations:

This nodule does NOT meet TI-RADS criteria for biopsy or dedicated
follow-up.

_________________________________________________________

Nodule # 6:

Location: Isthmus; right of midline

Maximum size: 0.9 cm; Other 2 dimensions: 0.4 x 0.9 cm

Composition: solid/almost completely solid (2)

Echogenicity: hypoechoic (2)

Shape: not taller-than-wide (0)

Margins: ill-defined (0)

Echogenic foci: none (0)

ACR TI-RADS total points: 4.

ACR TI-RADS risk category: TR4 (4-6 points).

ACR TI-RADS recommendations:

Given size (<0.9 cm) and appearance, this nodule does NOT meet
TI-RADS criteria for biopsy or dedicated follow-up.

_________________________________________________________

Nodule # 7:

Location: Left; Mid

Maximum size: 1.7 cm; Other 2 dimensions: 1.4 x 1.5 cm

Composition: solid/almost completely solid (2)

Echogenicity: isoechoic (1)

Shape: not taller-than-wide (0)

Margins: ill-defined (0)

Echogenic foci: none (0)

ACR TI-RADS total points: 3.

ACR TI-RADS risk category: TR3 (3 points).

ACR TI-RADS recommendations:

*Given size (>/= 1.5 - 2.4 cm) and appearance, a follow-up
ultrasound in 1 year should be considered based on TI-RADS criteria.

_________________________________________________________
IMPRESSION: 1. Stable thyromegaly with bilateral nodules. None currently meets
criteria for biopsy.
2. Recommend annual/biennial ultrasound follow-up as above, until
stability x5 years confirmed.

The above is in keeping with the ACR TI-RADS recommendations - [HOSPITAL] [7L];[DATE].

## 2018-11-25 DIAGNOSIS — Z6832 Body mass index (BMI) 32.0-32.9, adult: Secondary | ICD-10-CM | POA: Diagnosis not present

## 2018-11-25 DIAGNOSIS — N184 Chronic kidney disease, stage 4 (severe): Secondary | ICD-10-CM | POA: Diagnosis not present

## 2018-11-25 DIAGNOSIS — I10 Essential (primary) hypertension: Secondary | ICD-10-CM | POA: Diagnosis not present

## 2018-11-25 DIAGNOSIS — E1129 Type 2 diabetes mellitus with other diabetic kidney complication: Secondary | ICD-10-CM | POA: Diagnosis not present

## 2018-11-25 DIAGNOSIS — E1149 Type 2 diabetes mellitus with other diabetic neurological complication: Secondary | ICD-10-CM | POA: Diagnosis not present

## 2019-01-02 DIAGNOSIS — E039 Hypothyroidism, unspecified: Secondary | ICD-10-CM | POA: Diagnosis not present

## 2019-01-02 DIAGNOSIS — E559 Vitamin D deficiency, unspecified: Secondary | ICD-10-CM | POA: Diagnosis not present

## 2019-01-02 DIAGNOSIS — N183 Chronic kidney disease, stage 3 (moderate): Secondary | ICD-10-CM | POA: Diagnosis not present

## 2019-01-02 DIAGNOSIS — I10 Essential (primary) hypertension: Secondary | ICD-10-CM | POA: Diagnosis not present

## 2019-01-02 DIAGNOSIS — M810 Age-related osteoporosis without current pathological fracture: Secondary | ICD-10-CM | POA: Diagnosis not present

## 2019-01-02 DIAGNOSIS — E041 Nontoxic single thyroid nodule: Secondary | ICD-10-CM | POA: Diagnosis not present

## 2019-01-02 DIAGNOSIS — E7849 Other hyperlipidemia: Secondary | ICD-10-CM | POA: Diagnosis not present

## 2019-01-02 DIAGNOSIS — E1129 Type 2 diabetes mellitus with other diabetic kidney complication: Secondary | ICD-10-CM | POA: Diagnosis not present

## 2019-01-02 DIAGNOSIS — R82998 Other abnormal findings in urine: Secondary | ICD-10-CM | POA: Diagnosis not present

## 2019-01-02 DIAGNOSIS — E785 Hyperlipidemia, unspecified: Secondary | ICD-10-CM | POA: Diagnosis not present

## 2019-01-09 DIAGNOSIS — I2510 Atherosclerotic heart disease of native coronary artery without angina pectoris: Secondary | ICD-10-CM | POA: Diagnosis not present

## 2019-01-09 DIAGNOSIS — E1149 Type 2 diabetes mellitus with other diabetic neurological complication: Secondary | ICD-10-CM | POA: Diagnosis not present

## 2019-01-09 DIAGNOSIS — Z6832 Body mass index (BMI) 32.0-32.9, adult: Secondary | ICD-10-CM | POA: Diagnosis not present

## 2019-01-09 DIAGNOSIS — Z0000 Encounter for general adult medical examination without abnormal findings: Secondary | ICD-10-CM | POA: Diagnosis not present

## 2019-01-09 DIAGNOSIS — D72829 Elevated white blood cell count, unspecified: Secondary | ICD-10-CM | POA: Diagnosis not present

## 2019-01-09 DIAGNOSIS — G3184 Mild cognitive impairment, so stated: Secondary | ICD-10-CM | POA: Diagnosis not present

## 2019-01-09 DIAGNOSIS — N184 Chronic kidney disease, stage 4 (severe): Secondary | ICD-10-CM | POA: Diagnosis not present

## 2019-01-09 DIAGNOSIS — I10 Essential (primary) hypertension: Secondary | ICD-10-CM | POA: Diagnosis not present

## 2019-01-09 DIAGNOSIS — Z1389 Encounter for screening for other disorder: Secondary | ICD-10-CM | POA: Diagnosis not present

## 2019-01-09 DIAGNOSIS — E1129 Type 2 diabetes mellitus with other diabetic kidney complication: Secondary | ICD-10-CM | POA: Diagnosis not present

## 2019-01-09 DIAGNOSIS — J449 Chronic obstructive pulmonary disease, unspecified: Secondary | ICD-10-CM | POA: Diagnosis not present

## 2019-01-09 DIAGNOSIS — E7849 Other hyperlipidemia: Secondary | ICD-10-CM | POA: Diagnosis not present

## 2019-01-12 ENCOUNTER — Other Ambulatory Visit: Payer: Self-pay | Admitting: Internal Medicine

## 2019-01-12 DIAGNOSIS — F17201 Nicotine dependence, unspecified, in remission: Secondary | ICD-10-CM

## 2019-01-13 DIAGNOSIS — Z1212 Encounter for screening for malignant neoplasm of rectum: Secondary | ICD-10-CM | POA: Diagnosis not present

## 2019-01-27 ENCOUNTER — Telehealth: Payer: Self-pay | Admitting: Internal Medicine

## 2019-01-27 NOTE — Telephone Encounter (Signed)
rec'd letter from patient regarding cpap supplies not covered by insurance June 2019 Pt is concerned by she is charged $345.00 for cpap supplies, and why insurance didn't pay Pt was told she needed to be seen by [facility] yearly, that's why the bill was sent in June 2019 Patient was seen by [facility] Sept. 2019, and the claim was resubmitted by insurance The insurance paid for the cpap supplies in Sept 2019, no balance owed by patient for supplies  [facility] spoke with Sonia Baller at [phone] She confirmed pt has zero balance with them at this time, all supplies have been covered by insurance [employer], this bill the patient is speaking of from June 2019 was resent and paid in full by insurance Patient verbalized understanding, had no further concerns Nothing further needed.

## 2019-02-10 ENCOUNTER — Inpatient Hospital Stay: Admission: RE | Admit: 2019-02-10 | Payer: Medicare Other | Source: Ambulatory Visit

## 2019-02-10 DIAGNOSIS — I10 Essential (primary) hypertension: Secondary | ICD-10-CM | POA: Diagnosis not present

## 2019-02-10 DIAGNOSIS — E1129 Type 2 diabetes mellitus with other diabetic kidney complication: Secondary | ICD-10-CM | POA: Diagnosis not present

## 2019-02-10 DIAGNOSIS — N184 Chronic kidney disease, stage 4 (severe): Secondary | ICD-10-CM | POA: Diagnosis not present

## 2019-03-11 DIAGNOSIS — B078 Other viral warts: Secondary | ICD-10-CM | POA: Diagnosis not present

## 2019-03-11 DIAGNOSIS — L820 Inflamed seborrheic keratosis: Secondary | ICD-10-CM | POA: Diagnosis not present

## 2019-03-11 DIAGNOSIS — L859 Epidermal thickening, unspecified: Secondary | ICD-10-CM | POA: Diagnosis not present

## 2019-03-11 DIAGNOSIS — L280 Lichen simplex chronicus: Secondary | ICD-10-CM | POA: Diagnosis not present

## 2019-03-11 DIAGNOSIS — H61001 Unspecified perichondritis of right external ear: Secondary | ICD-10-CM | POA: Diagnosis not present

## 2019-06-12 DIAGNOSIS — B0052 Herpesviral keratitis: Secondary | ICD-10-CM | POA: Diagnosis not present

## 2019-06-17 ENCOUNTER — Telehealth: Payer: Self-pay | Admitting: Internal Medicine

## 2019-06-17 DIAGNOSIS — G4733 Obstructive sleep apnea (adult) (pediatric): Secondary | ICD-10-CM

## 2019-06-17 NOTE — Telephone Encounter (Signed)
Left message for patient to call back  

## 2019-06-18 NOTE — Telephone Encounter (Signed)
Spoke with pt. She is needing her pressure settings of her CPAP sent to [facility] in [place], [place]. This has been sent to them. Nothing further was needed.

## 2019-06-19 DIAGNOSIS — B0052 Herpesviral keratitis: Secondary | ICD-10-CM | POA: Diagnosis not present

## 2019-06-23 DIAGNOSIS — Z794 Long term (current) use of insulin: Secondary | ICD-10-CM | POA: Diagnosis not present

## 2019-06-23 DIAGNOSIS — I10 Essential (primary) hypertension: Secondary | ICD-10-CM | POA: Diagnosis not present

## 2019-06-23 DIAGNOSIS — E1129 Type 2 diabetes mellitus with other diabetic kidney complication: Secondary | ICD-10-CM | POA: Diagnosis not present

## 2019-06-23 DIAGNOSIS — N184 Chronic kidney disease, stage 4 (severe): Secondary | ICD-10-CM | POA: Diagnosis not present

## 2019-07-10 ENCOUNTER — Other Ambulatory Visit: Payer: Self-pay | Admitting: Gastroenterology

## 2019-07-24 DIAGNOSIS — B0052 Herpesviral keratitis: Secondary | ICD-10-CM | POA: Diagnosis not present

## 2019-07-27 ENCOUNTER — Telehealth: Payer: Self-pay | Admitting: Internal Medicine

## 2019-07-27 DIAGNOSIS — G4733 Obstructive sleep apnea (adult) (pediatric): Secondary | ICD-10-CM

## 2019-07-27 NOTE — Telephone Encounter (Signed)
Called & spoke w/ pt regarding her letter sent to [facility] on 07/10/2019. Pt states her insurance company, Medicare, would not cover her CPAP through [employer], despite pt having her CPAP established with [facility] for years now. She states she switched to [facility] for awhile, however, found their office location was too far for her. She also noted [facility] sent her supplies that weren't covered.   Pt is now requesting an order for CPAP supplies be sent to [facility]. She has her yearly ROV with [facility] 09/16/2019 at 11:00 AM. I verified this with her and let her know we would get this taken care of for her. Pt expressed understanding with no additional questions.   Order for CPAP supplies have been placed to [facility]. Nothing further needed at this time.

## 2019-07-29 DIAGNOSIS — R809 Proteinuria, unspecified: Secondary | ICD-10-CM | POA: Diagnosis not present

## 2019-07-29 DIAGNOSIS — N184 Chronic kidney disease, stage 4 (severe): Secondary | ICD-10-CM | POA: Diagnosis not present

## 2019-07-29 DIAGNOSIS — Z87442 Personal history of urinary calculi: Secondary | ICD-10-CM | POA: Diagnosis not present

## 2019-07-29 DIAGNOSIS — D509 Iron deficiency anemia, unspecified: Secondary | ICD-10-CM | POA: Diagnosis not present

## 2019-07-29 DIAGNOSIS — E1122 Type 2 diabetes mellitus with diabetic chronic kidney disease: Secondary | ICD-10-CM | POA: Diagnosis not present

## 2019-07-29 DIAGNOSIS — I129 Hypertensive chronic kidney disease with stage 1 through stage 4 chronic kidney disease, or unspecified chronic kidney disease: Secondary | ICD-10-CM | POA: Diagnosis not present

## 2019-07-29 DIAGNOSIS — E1129 Type 2 diabetes mellitus with other diabetic kidney complication: Secondary | ICD-10-CM | POA: Diagnosis not present

## 2019-08-11 DIAGNOSIS — N184 Chronic kidney disease, stage 4 (severe): Secondary | ICD-10-CM | POA: Diagnosis not present

## 2019-08-11 DIAGNOSIS — E1129 Type 2 diabetes mellitus with other diabetic kidney complication: Secondary | ICD-10-CM | POA: Diagnosis not present

## 2019-08-11 DIAGNOSIS — Z794 Long term (current) use of insulin: Secondary | ICD-10-CM | POA: Diagnosis not present

## 2019-08-11 DIAGNOSIS — I129 Hypertensive chronic kidney disease with stage 1 through stage 4 chronic kidney disease, or unspecified chronic kidney disease: Secondary | ICD-10-CM | POA: Diagnosis not present

## 2019-08-15 ENCOUNTER — Other Ambulatory Visit (HOSPITAL_COMMUNITY)
Admission: RE | Admit: 2019-08-15 | Discharge: 2019-08-15 | Disposition: A | Discharge status: Home / Self Care | Payer: Medicare Other | Source: Ambulatory Visit | Attending: Gastroenterology | Admitting: Gastroenterology

## 2019-08-15 DIAGNOSIS — Z01812 Encounter for preprocedural laboratory examination: Secondary | ICD-10-CM | POA: Diagnosis not present

## 2019-08-15 DIAGNOSIS — Z20828 Contact with and (suspected) exposure to other viral communicable diseases: Secondary | ICD-10-CM | POA: Insufficient documentation

## 2019-08-15 DIAGNOSIS — K317 Polyp of stomach and duodenum: Secondary | ICD-10-CM | POA: Insufficient documentation

## 2019-08-15 LAB — SARS CORONAVIRUS 2 (TAT 6-24 HRS): SARS Coronavirus 2: NEGATIVE

## 2019-08-18 ENCOUNTER — Encounter (HOSPITAL_COMMUNITY): Payer: Self-pay | Admitting: *Deleted

## 2019-08-18 ENCOUNTER — Other Ambulatory Visit: Payer: Self-pay

## 2019-08-18 NOTE — Progress Notes (Signed)
Spoke with patient.  Patient has quarantined and no symptoms since Covid 19 testing.  Aware to NPO after MN.  Answered patient's questions.

## 2019-08-19 ENCOUNTER — Encounter (HOSPITAL_COMMUNITY): Payer: Self-pay | Admitting: Anesthesiology

## 2019-08-19 ENCOUNTER — Other Ambulatory Visit: Payer: Self-pay

## 2019-08-19 ENCOUNTER — Ambulatory Visit (HOSPITAL_COMMUNITY): Payer: Medicare Other | Admitting: Anesthesiology

## 2019-08-19 ENCOUNTER — Encounter (HOSPITAL_COMMUNITY)
Admission: RE | Disposition: A | Discharge status: Home / Self Care | Payer: Self-pay | Source: Home / Self Care | Attending: Gastroenterology

## 2019-08-19 ENCOUNTER — Ambulatory Visit (HOSPITAL_COMMUNITY)
Admission: RE | Admit: 2019-08-19 | Discharge: 2019-08-19 | Disposition: A | Discharge status: Home / Self Care | Payer: Medicare Other | Attending: Gastroenterology | Admitting: Gastroenterology

## 2019-08-19 DIAGNOSIS — E669 Obesity, unspecified: Secondary | ICD-10-CM | POA: Diagnosis not present

## 2019-08-19 DIAGNOSIS — Z9049 Acquired absence of other specified parts of digestive tract: Secondary | ICD-10-CM | POA: Insufficient documentation

## 2019-08-19 DIAGNOSIS — I10 Essential (primary) hypertension: Secondary | ICD-10-CM | POA: Insufficient documentation

## 2019-08-19 DIAGNOSIS — J449 Chronic obstructive pulmonary disease, unspecified: Secondary | ICD-10-CM | POA: Diagnosis not present

## 2019-08-19 DIAGNOSIS — Z79899 Other long term (current) drug therapy: Secondary | ICD-10-CM | POA: Insufficient documentation

## 2019-08-19 DIAGNOSIS — F329 Major depressive disorder, single episode, unspecified: Secondary | ICD-10-CM | POA: Diagnosis not present

## 2019-08-19 DIAGNOSIS — Z7984 Long term (current) use of oral hypoglycemic drugs: Secondary | ICD-10-CM | POA: Diagnosis not present

## 2019-08-19 DIAGNOSIS — Z7982 Long term (current) use of aspirin: Secondary | ICD-10-CM | POA: Diagnosis not present

## 2019-08-19 DIAGNOSIS — Z800 Family history of malignant neoplasm of digestive organs: Secondary | ICD-10-CM | POA: Diagnosis not present

## 2019-08-19 DIAGNOSIS — K2950 Unspecified chronic gastritis without bleeding: Secondary | ICD-10-CM | POA: Insufficient documentation

## 2019-08-19 DIAGNOSIS — K449 Diaphragmatic hernia without obstruction or gangrene: Secondary | ICD-10-CM | POA: Insufficient documentation

## 2019-08-19 DIAGNOSIS — Z885 Allergy status to narcotic agent status: Secondary | ICD-10-CM | POA: Insufficient documentation

## 2019-08-19 DIAGNOSIS — G4739 Other sleep apnea: Secondary | ICD-10-CM | POA: Insufficient documentation

## 2019-08-19 DIAGNOSIS — E118 Type 2 diabetes mellitus with unspecified complications: Secondary | ICD-10-CM | POA: Insufficient documentation

## 2019-08-19 DIAGNOSIS — Z6833 Body mass index (BMI) 33.0-33.9, adult: Secondary | ICD-10-CM | POA: Insufficient documentation

## 2019-08-19 DIAGNOSIS — Z820 Family history of epilepsy and other diseases of the nervous system: Secondary | ICD-10-CM | POA: Diagnosis not present

## 2019-08-19 DIAGNOSIS — K317 Polyp of stomach and duodenum: Secondary | ICD-10-CM | POA: Diagnosis not present

## 2019-08-19 DIAGNOSIS — E7800 Pure hypercholesterolemia, unspecified: Secondary | ICD-10-CM | POA: Diagnosis not present

## 2019-08-19 DIAGNOSIS — Z87891 Personal history of nicotine dependence: Secondary | ICD-10-CM | POA: Insufficient documentation

## 2019-08-19 DIAGNOSIS — D132 Benign neoplasm of duodenum: Secondary | ICD-10-CM | POA: Diagnosis not present

## 2019-08-19 DIAGNOSIS — K219 Gastro-esophageal reflux disease without esophagitis: Secondary | ICD-10-CM | POA: Diagnosis not present

## 2019-08-19 DIAGNOSIS — Z87442 Personal history of urinary calculi: Secondary | ICD-10-CM | POA: Insufficient documentation

## 2019-08-19 HISTORY — PX: POLYPECTOMY: SHX5525

## 2019-08-19 HISTORY — PX: HEMOSTASIS CLIP PLACEMENT: SHX6857

## 2019-08-19 HISTORY — PX: ESOPHAGOGASTRODUODENOSCOPY (EGD) WITH PROPOFOL: SHX5813

## 2019-08-19 LAB — GLUCOSE, CAPILLARY: Glucose-Capillary: 121 mg/dL — ABNORMAL HIGH (ref 70–99)

## 2019-08-19 SURGERY — ESOPHAGOGASTRODUODENOSCOPY (EGD) WITH PROPOFOL
Anesthesia: Monitor Anesthesia Care

## 2019-08-19 MED ORDER — PROPOFOL 500 MG/50ML IV EMUL
INTRAVENOUS | Status: DC | PRN
  Administered 2019-08-19: 120 ug/kg/min via INTRAVENOUS

## 2019-08-19 MED ORDER — SODIUM CHLORIDE 0.9 % IV SOLN: INTRAVENOUS | Status: DC

## 2019-08-19 MED ORDER — PROPOFOL 10 MG/ML IV BOLUS
INTRAVENOUS | Status: DC | PRN
  Administered 2019-08-19 (×3): 20 mg via INTRAVENOUS

## 2019-08-19 MED ORDER — PROPOFOL 10 MG/ML IV BOLUS
INTRAVENOUS | Status: AC
  Filled 2019-08-19: qty 40

## 2019-08-19 MED ORDER — LACTATED RINGERS IV SOLN
INTRAVENOUS | Status: DC
  Administered 2019-08-19: 10:00:00 via INTRAVENOUS

## 2019-08-19 MED ORDER — LIDOCAINE 2% (20 MG/ML) 5 ML SYRINGE
INTRAMUSCULAR | Status: DC | PRN
  Administered 2019-08-19: 100 mg via INTRAVENOUS

## 2019-08-19 SURGICAL SUPPLY — 15 items

## 2019-08-19 NOTE — Anesthesia Procedure Notes (Signed)
Date/Time: 08/19/2019 11:50 AM Performed by: Sharlette Dense, CRNA Oxygen Delivery Method: Nasal cannula

## 2019-08-19 NOTE — Progress Notes (Signed)
[name] [name] 11:52 AM  Subjective: Patient is asymptomatic from a GI standpoint and no new medical problems since we have seen her in the past and we again discussed her duodenal polyp  Objective: Vital signs stable afebrile exam please see preassessment evaluation  Assessment: Duodenal polyp  Plan: Okay to proceed with endoscopy with anesthesia assistance today to reevaluate her polyp  [facility]  office [phone] After 5PM or if no answer call [phone]

## 2019-08-19 NOTE — Discharge Instructions (Signed)
YOU HAD AN ENDOSCOPIC PROCEDURE TODAY: Refer to the procedure report and other information in the discharge instructions given to you for any specific questions about what was found during the examination. If this information does not answer your questions, please call [facility] office at [phone] to clarify.   YOU SHOULD EXPECT: Some feelings of bloating in the abdomen. Passage of more gas than usual. Walking can help get rid of the air that was put into your GI tract during the procedure and reduce the bloating. If you had a lower endoscopy (such as a colonoscopy or flexible sigmoidoscopy) you may notice spotting of blood in your stool or on the toilet paper. Some abdominal soreness may be present for a day or two, also.  DIET: Your first meal following the procedure should be a light meal and then it is ok to progress to your normal diet. A half-sandwich or bowl of soup is an example of a good first meal. Heavy or fried foods are harder to digest and may make you feel nauseous or bloated. Drink plenty of fluids but you should avoid alcoholic beverages for 24 hours. If you had a esophageal dilation, please see attached instructions for diet.   ACTIVITY: Your care partner should take you home directly after the procedure. You should plan to take it easy, moving slowly for the rest of the day. You can resume normal activity the day after the procedure however YOU SHOULD NOT DRIVE, use power tools, machinery or perform tasks that involve climbing or major physical exertion for 24 hours (because of the sedation medicines used during the test).   SYMPTOMS TO REPORT IMMEDIATELY: A gastroenterologist can be reached at any hour. Please call [phone]  for any of the following symptoms:   Following upper endoscopy (EGD, EUS, ERCP, esophageal dilation) Vomiting of blood or coffee ground material  New, significant abdominal pain  New, significant chest pain or pain under the shoulder blades  Painful or  persistently difficult swallowing  New shortness of breath  Black, tarry-looking or red, bloody stools  FOLLOW UP:  If any biopsies were taken you will be contacted by phone or by letter within the next 1-3 weeks. Call [phone]  if you have not heard about the biopsies in 3 weeks.  Please also call with any specific questions about appointments or follow up tests. Call if question or problem otherwise call for biopsy report in 1 week and hold aspirin for 2 days and have soft solids first meal today

## 2019-08-19 NOTE — Transfer of Care (Signed)
Immediate Anesthesia Transfer of Care Note  Patient: [name] [name]  Procedure(s) Performed: ESOPHAGOGASTRODUODENOSCOPY (EGD) WITH PROPOFOL (N/A ) POLYPECTOMY HEMOSTASIS CLIP PLACEMENT  Patient Location: Endoscopy Unit  Anesthesia Type:MAC  Level of Consciousness: awake  Airway & Oxygen Therapy: Patient Spontanous Breathing and Patient connected to nasal cannula oxygen  Post-op Assessment: Report given to RN and Post -op Vital signs reviewed and stable  Post vital signs: Reviewed and stable  Last Vitals:  Vitals Value Taken Time  BP    Temp    Pulse    Resp    SpO2      Last Pain:  Vitals:   08/19/19 1007  TempSrc: Oral  PainSc: 0-No pain         Complications: No apparent anesthesia complications

## 2019-08-19 NOTE — Anesthesia Postprocedure Evaluation (Signed)
Anesthesia Post Note  Patient: [name] [name]  Procedure(s) Performed: ESOPHAGOGASTRODUODENOSCOPY (EGD) WITH PROPOFOL (N/A ) POLYPECTOMY HEMOSTASIS CLIP PLACEMENT     Patient location during evaluation: PACU Anesthesia Type: MAC Level of consciousness: awake and alert Pain management: pain level controlled Vital Signs Assessment: post-procedure vital signs reviewed and stable Respiratory status: spontaneous breathing, nonlabored ventilation and respiratory function stable Cardiovascular status: stable and blood pressure returned to baseline Anesthetic complications: no    Last Vitals:  Vitals:   08/19/19 1240 08/19/19 1245  BP: 128/85   Pulse: 71 75  Resp: 19 (!) 21  Temp:    SpO2: 94% 97%    Last Pain:  Vitals:   08/19/19 1220  TempSrc: Oral  PainSc: 0-No pain                 Audry Pili

## 2019-08-19 NOTE — Anesthesia Preprocedure Evaluation (Addendum)
Anesthesia Evaluation  Patient identified by MRN, date of birth, ID band Patient awake    Reviewed: Allergy & Precautions, NPO status , Patient's Chart, lab work & pertinent test results  History of Anesthesia Complications Negative for: history of anesthetic complications  Airway Mallampati: II  TM Distance: >3 FB Neck ROM: Full    Dental  (+) Dental Advisory Given, Chipped,    Pulmonary sleep apnea and Continuous Positive Airway Pressure Ventilation , COPD, former smoker,    Pulmonary exam normal        Cardiovascular hypertension, Pt. on medications (-) angina+ CAD  Normal cardiovascular exam   '19 Stress Test - The LVEF is hyperdynamic (>65%). Nuclear stress EF: 66%. The study is normal. This is a low risk study. There was no ST segment deviation noted during stress. No T wave inversion was noted during stress.   '19 TTE - Moderate concentric LVH. EF 55% to 60%. Grade 1 diastolic dysfunction. A bicuspid AV morphology cannot be excluded; moderately thickened, moderately calcified leaflets. Mild AS. Trivial TR and PR.     Neuro/Psych PSYCHIATRIC DISORDERS Depression negative neurological ROS     GI/Hepatic Neg liver ROS, GERD  ,  Endo/Other  diabetes, Type 2, Insulin Dependent Obesity   Renal/GU  Kidney stones      Musculoskeletal negative musculoskeletal ROS (+)   Abdominal   Peds  Hematology negative hematology ROS (+)   Anesthesia Other Findings   Reproductive/Obstetrics                            Anesthesia Physical Anesthesia Plan  ASA: III  Anesthesia Plan: MAC   Post-op Pain Management:    Induction: Intravenous  PONV Risk Score and Plan: 2 and Propofol infusion and Treatment may vary due to age or medical condition  Airway Management Planned: Nasal Cannula and Natural Airway  Additional Equipment: None  Intra-op Plan:   Post-operative Plan:    Informed Consent: I have reviewed the patients History and Physical, chart, labs and discussed the procedure including the risks, benefits and alternatives for the proposed anesthesia with the patient or authorized representative who has indicated his/her understanding and acceptance.       Plan Discussed with: CRNA and Anesthesiologist  Anesthesia Plan Comments:        Anesthesia Quick Evaluation

## 2019-08-19 NOTE — Op Note (Signed)
[facility] Patient Name: [name] [name] Procedure Date: 08/19/2019 MRN: [id number] Attending MD: Clarene Essex , MD Date of Birth: 07/16/1941 CSN: [id number] Age: 78 Admit Type: Outpatient Procedure:                Upper GI endoscopy Indications:              For therapy of polyps in the duodenum Providers:                Clarene Essex, MD, Cleda Daub, RN, Lina Sar,                            Technician, Elspeth Cho Tech., Technician, Danley Danker, CRNA Referring MD:              Medicines:                Propofol total dose 210 mg IV, 100 mg IV lidocaine Complications:            No immediate complication. Estimated blood loss:                            Minimal. From polypectomy as above Estimated Blood Loss:     Estimated blood loss was minimal. Procedure:                Pre-Anesthesia Assessment:                           - Prior to the procedure, a History and Physical                            was performed, and patient medications and                            allergies were reviewed. The patient's tolerance of                            previous anesthesia was also reviewed. The risks                            and benefits of the procedure and the sedation                            options and risks were discussed with the patient.                            All questions were answered, and informed consent                            was obtained. Prior Anticoagulants: The patient has                            taken no previous anticoagulant or antiplatelet  agents except for aspirin. ASA Grade Assessment: II                            - A patient with mild systemic disease. After                            reviewing the risks and benefits, the patient was                            deemed in satisfactory condition to undergo the                            procedure.  After obtaining informed consent, the endoscope was                            passed under direct vision. Throughout the                            procedure, the patient's blood pressure, pulse, and                            oxygen saturations were monitored continuously. The                            GIF-H190 ([id number]) Olympus gastroscope was                            introduced through the mouth, and advanced to the                            third part of duodenum. The upper GI endoscopy was                            accomplished without difficulty. The patient                            tolerated the procedure well. Scope In: Scope Out: Findings:      The larynx was normal.      A small hiatal hernia was present.      Localized mild inflammation characterized by erythema was found in the       gastric antrum.      The duodenal bulb, first portion of the duodenum and third portion of       the duodenum were normal.      Two medium-sized and small semi-sessile polyps were found in the second       portion of the duodenum. The polyp was removed with a piecemeal       technique using a hot snare with setting of 1515. Resection and       retrieval were complete. To treat minimal oozing at the end of the       procedure after the polypectomy, one hemostatic clip was successfully       placed (MR conditional). There was no bleeding at the end of the       procedure.  The exam was otherwise without abnormality. Impression:               - Normal larynx.                           - Small hiatal hernia.                           - Chronic gastritis.                           - Normal duodenal bulb, first portion of the                            duodenum and third portion of the duodenum.                           - Two duodenal polyps. Resected and retrieved. Clip                            (MR conditional) was placed.                           - The examination was otherwise  normal. Moderate Sedation:      Not Applicable - Patient had care per Anesthesia. Recommendation:           - Patient has a contact number available for                            emergencies. The signs and symptoms of potential                            delayed complications were discussed with the                            patient. Return to normal activities tomorrow.                            Written discharge instructions were provided to the                            patient.                           - Soft diet today.                           - No aspirin, ibuprofen, naproxen, or other                            non-steroidal anti-inflammatory drugs for 2 days                            after polyp removal.                           - Await pathology  results.                           - Return to GI clinic PRN.                           - Telephone GI clinic for pathology results in 1                            week.                           - Telephone GI clinic if symptomatic PRN. Repeat                            endoscopy pending pathology Procedure Code(s):        --- Professional ---                           [phone], Esophagogastroduodenoscopy, flexible,                            transoral; with removal of tumor(s), polyp(s), or                            other lesion(s) by snare technique Diagnosis Code(s):        --- Professional ---                           K44.9, Diaphragmatic hernia without obstruction or                            gangrene                           K29.50, Unspecified chronic gastritis without                            bleeding                           K31.7, Polyp of stomach and duodenum CPT copyright 2019 American [employer] Association. All rights reserved. The codes documented in this report are preliminary and upon coder review may  be revised to meet current compliance requirements. Clarene Essex, MD 08/19/2019 12:30:11 PM This report has  been signed electronically. Number of Addenda: 0

## 2019-08-20 ENCOUNTER — Encounter (HOSPITAL_COMMUNITY): Payer: Self-pay | Admitting: Gastroenterology

## 2019-09-01 ENCOUNTER — Other Ambulatory Visit: Payer: Self-pay | Admitting: Surgery

## 2019-09-01 DIAGNOSIS — E042 Nontoxic multinodular goiter: Secondary | ICD-10-CM

## 2019-09-01 DIAGNOSIS — D440 Neoplasm of uncertain behavior of thyroid gland: Secondary | ICD-10-CM

## 2019-09-08 ENCOUNTER — Ambulatory Visit
Admission: RE | Admit: 2019-09-08 | Discharge: 2019-09-08 | Disposition: A | Discharge status: Home / Self Care | Payer: Medicare Other | Source: Ambulatory Visit | Attending: Surgery | Admitting: Surgery

## 2019-09-08 DIAGNOSIS — D440 Neoplasm of uncertain behavior of thyroid gland: Secondary | ICD-10-CM

## 2019-09-08 DIAGNOSIS — E042 Nontoxic multinodular goiter: Secondary | ICD-10-CM

## 2019-09-08 DIAGNOSIS — E041 Nontoxic single thyroid nodule: Secondary | ICD-10-CM | POA: Diagnosis not present

## 2019-09-08 IMAGING — US US THYROID
1 series · 12 of 25 positions shown · non-contrast
Comparison: [DATE]

CLINICAL DATA: Thyroid nodules. Ultrasound-guided biopsy of the
isthmus nodule and the left superior thyroid nodule on [DATE].
Previous ultrasound-guided biopsy of the mid / inferior right nodule
on [DATE].

EXAM:
THYROID ULTRASOUND
TECHNIQUE: Ultrasound examination of the thyroid gland and adjacent soft
tissues was performed.

[Series 1: us thyroid · 0.06mm/px · 12 of 73 slices shown]
[im 4/73]
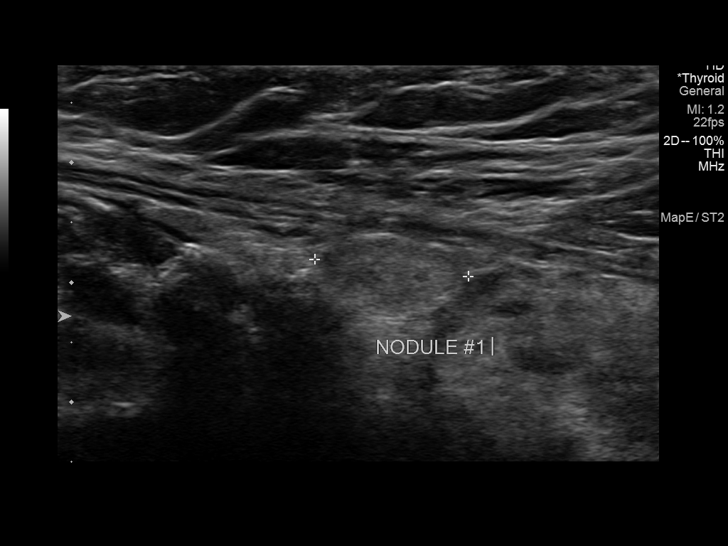
[im 10/73]
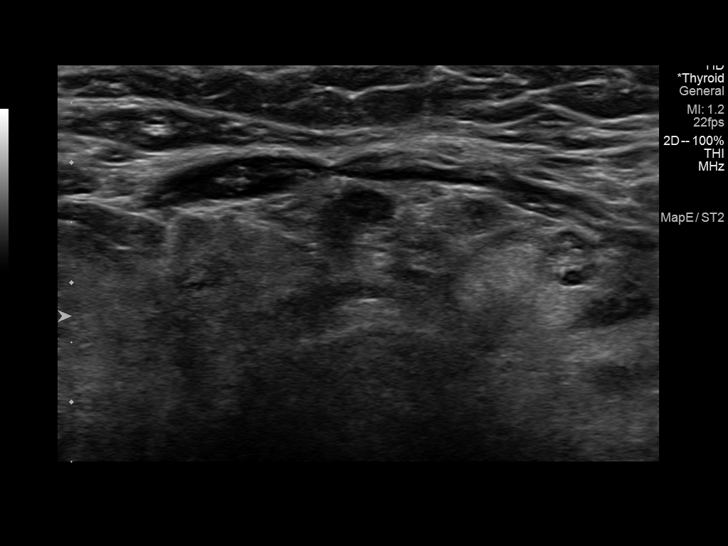
[im 16/73]
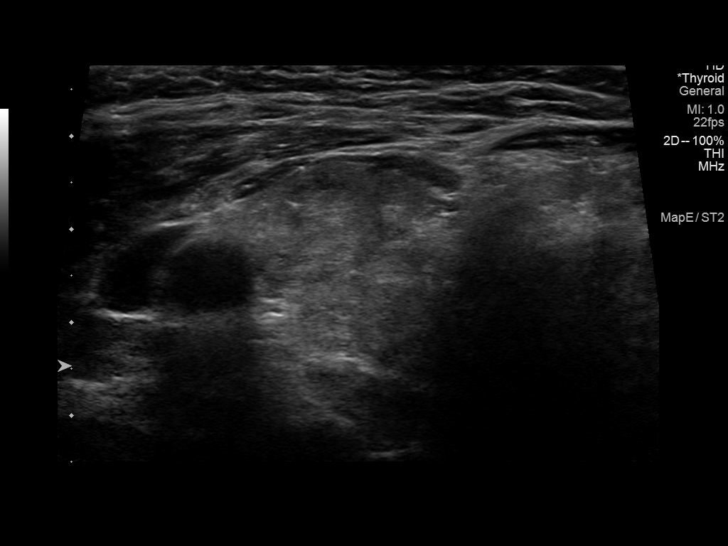
[im 22/73]
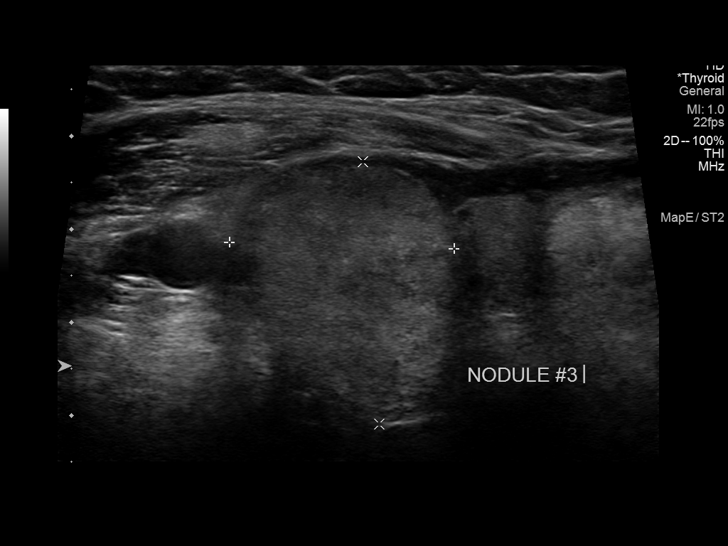
[im 28/73]
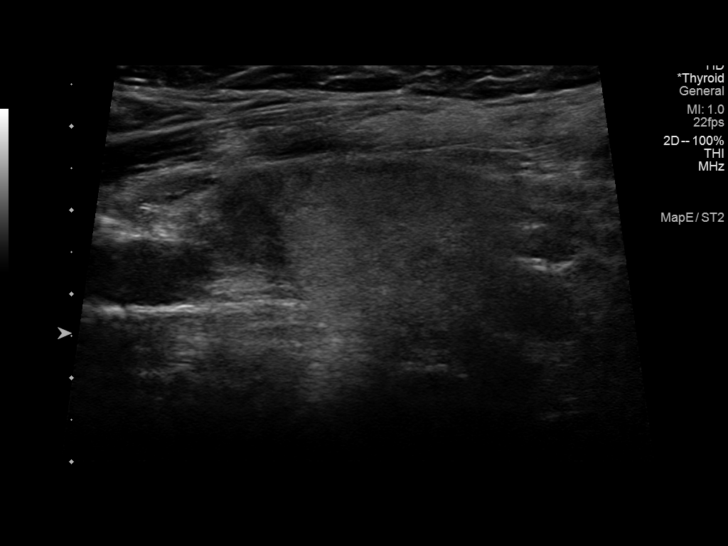
[im 34/73]
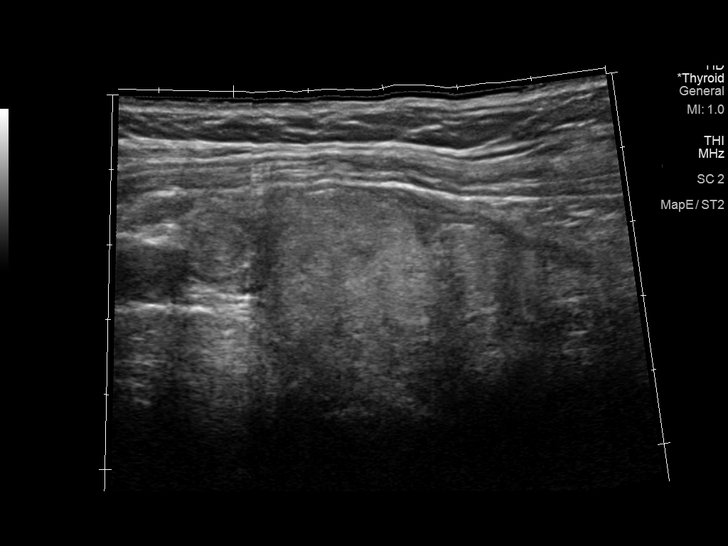
[im 40/73]
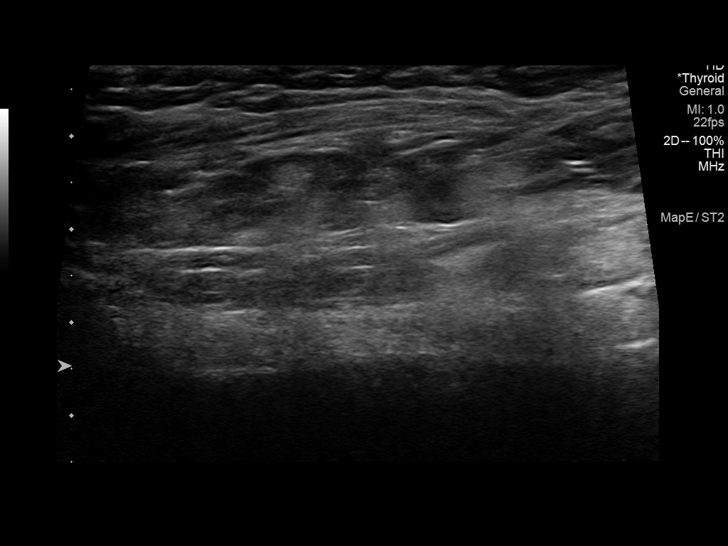
[im 46/73]
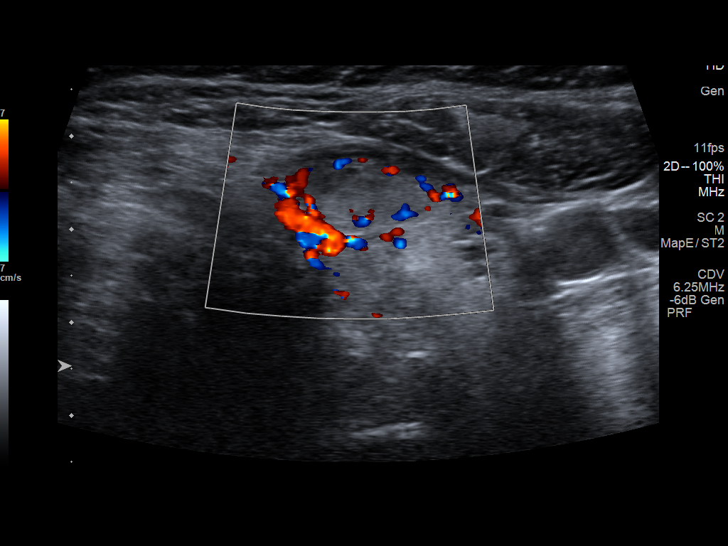
[im 52/73]
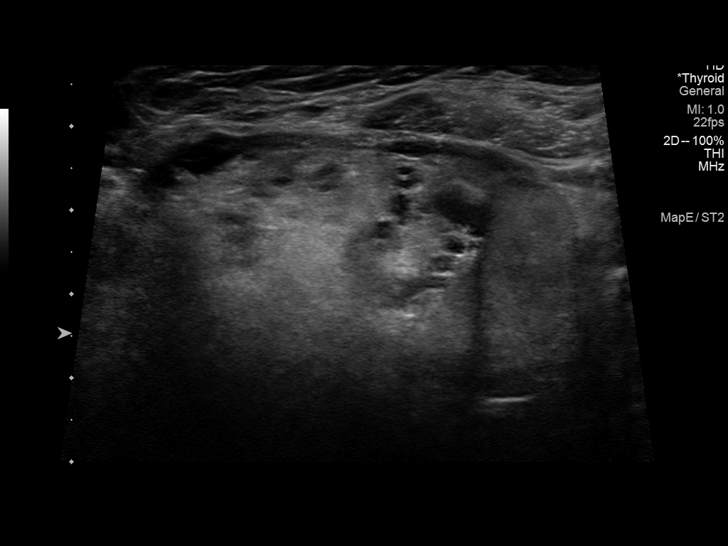
[im 58/73]
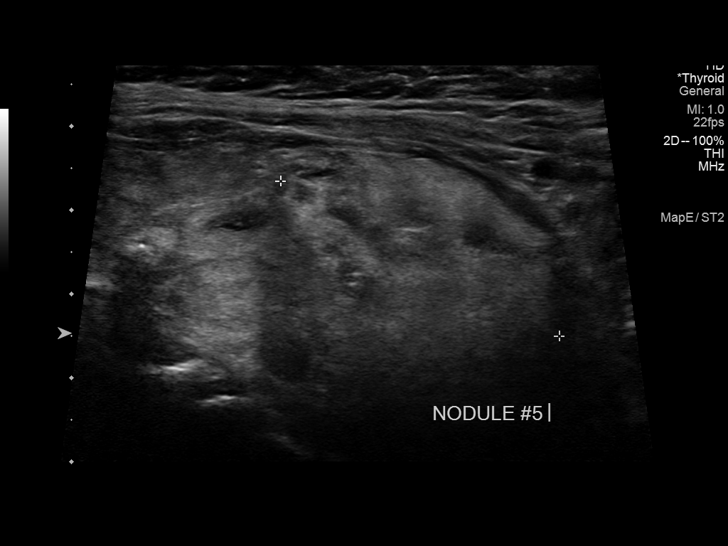
[im 64/73]
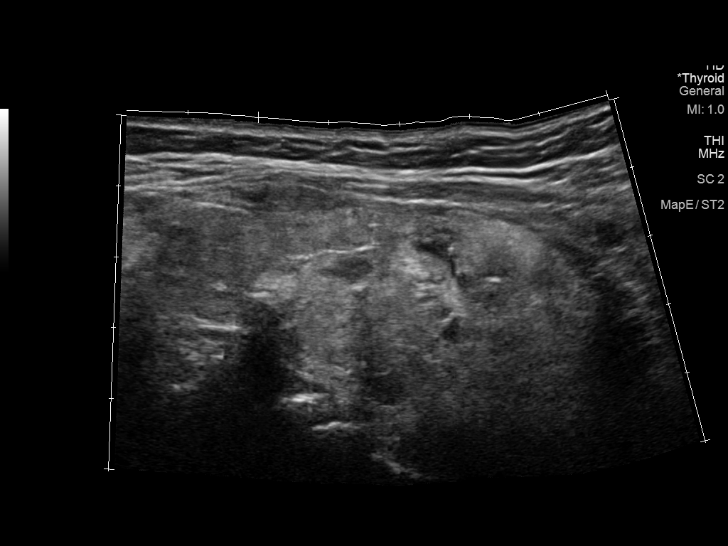
[im 70/73]
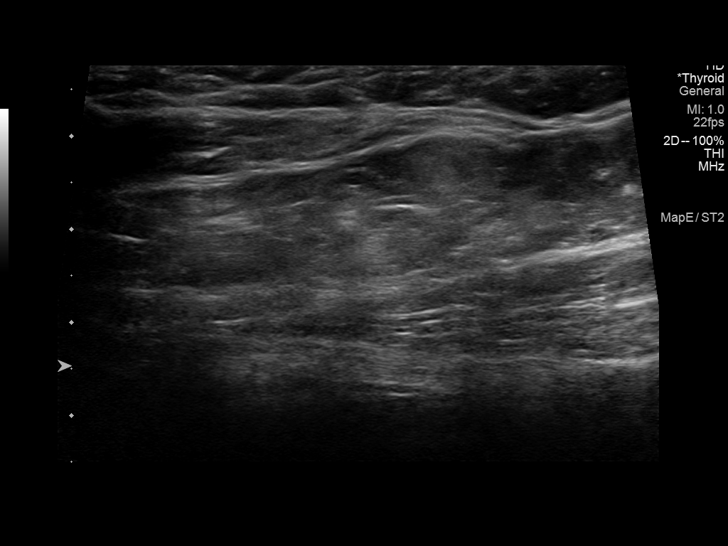

[12 of 25 positions shown; findings below may reference images not displayed]

FINDINGS: Parenchymal Echotexture: Markedly heterogeneous

Isthmus: 0.5 cm, previously 1.1 cm

Right lobe: 6.7 x 2.9 x 4.0 cm, previously 5.2 x 2.8 x 3.0 cm

Left lobe: 7.1 x 3.1 x 3.0 cm, previously 5.3 x 2.9 x 2.6 cm

_________________________________________________________

Estimated total number of nodules >/= 1 cm: 5

Number of spongiform nodules >/=  2 cm not described below (TR1): 0

Number of mixed cystic and solid nodules >/= 1.5 cm not described
below (TR2): 0

_________________________________________________________

Nodule #1 is heterogeneous nodule along the left side of the isthmus
and appears to represent the previously biopsied nodule. This area
measures 1.3 x 0.7 x 1.1 cm and previously measured 1.1 x 0.7 x
cm.

Irregular hypoechoic nodule in the mid isthmus measures up to the
0.7 cm and stable from the prior examination.

Nodule # 2:

Prior biopsy: No

Location: Right; Superior

Maximum size: 1.6 cm; Other 2 dimensions: 1.5 x 1.4 cm, previously,
1.5 x 1.2 x 1.4 cm

Composition: solid/almost completely solid (2)

Echogenicity: isoechoic (1)

Shape: not taller-than-wide (0)

Margins: ill-defined (0)

Echogenic foci: none (0)

ACR TI-RADS total points: 3.

ACR TI-RADS risk category:  TR3 (3 points).

Significant change in size (>/= 20% in two dimensions and minimal
increase of 2 mm): No

Change in features: No

Change in ACR TI-RADS risk category: No

ACR TI-RADS recommendations:

*Given size (>/= 1.5 - 2.4 cm) and appearance, a follow-up
ultrasound in 1 year should be considered based on TI-RADS criteria.

_________________________________________________________

Isoechoic nodule in the mid right thyroid lobe appears to represent
the previously biopsied nodule. This is a solid isoechoic nodule
that measures 3.3 x 2.8 x 2.4 cm and previously measured 3.3 x 2.6 x
2.3 cm. This nodule appears stable.

Nodule # 5:

Prior biopsy: No

Location: Left; Inferior

Maximum size: 3.8 cm; Other 2 dimensions: 2.3 x 4.0 cm, previously,
3.7 x 2.5 x 3.2 cm cm

Composition: cannot determine (2)

Echogenicity: isoechoic (1)

Shape: not taller-than-wide (0)

Margins: ill-defined (0)

Echogenic foci: macrocalcifications (1)

ACR TI-RADS total points: 4.

ACR TI-RADS risk category:  TR4 (4-6 points).

Significant change in size (>/= 20% in two dimensions and minimal
increase of 2 mm): No

Change in features: Yes; composition is indeterminate. There are
cystic components to this nodule but majority of the nodule is
probably solid.

Change in ACR TI-RADS risk category: Yes

ACR TI-RADS recommendations:

**Given size (>/= 1.5 cm) and appearance, fine needle aspiration of
this moderately suspicious nodule should be considered based on
TI-RADS criteria.

_________________________________________________________

Nodule # 7:

Prior biopsy: No

Location: Left; Mid

Maximum size: 1.8 cm; Other 2 dimensions: 1.6 x 1.8 cm, previously,
1.7 x 0.8 x 1.6 cm

Composition: solid/almost completely solid (2)

Echogenicity: isoechoic (1)

Shape: not taller-than-wide (0)

Margins: ill-defined (0)

Echogenic foci: none (0)

ACR TI-RADS total points: 3.

ACR TI-RADS risk category:  TR3 (3 points).

Significant change in size (>/= 20% in two dimensions and minimal
increase of 2 mm): No

Change in features: No

Change in ACR TI-RADS risk category: No

ACR TI-RADS recommendations:

*Given size (>/= 1.5 - 2.4 cm) and appearance, a follow-up
ultrasound in 1 year should be considered based on TI-RADS criteria.

_________________________________________________________
IMPRESSION: 1. Bilateral thyroid nodules.
2. Nodule #5 in the inferior left thyroid lobe has minimally changed
but nodule appears to be more solid than cystic. This is compatible
with a TR 4 nodule and meets criteria for ultrasound-guided biopsy.
3. Previously biopsied nodules are stable.
4. Nodule #2 in the superior right thyroid lobe and nodule #7 in the
mid left thyroid lobe meet criteria for 1 year follow-up.

The above is in keeping with the ACR TI-RADS recommendations - [HOSPITAL] [DK];[DATE].

## 2019-09-12 DIAGNOSIS — Z23 Encounter for immunization: Secondary | ICD-10-CM | POA: Diagnosis not present

## 2019-09-16 ENCOUNTER — Encounter: Payer: Self-pay | Admitting: Internal Medicine

## 2019-09-16 ENCOUNTER — Ambulatory Visit (INDEPENDENT_AMBULATORY_CARE_PROVIDER_SITE_OTHER): Payer: Medicare Other | Admitting: Internal Medicine

## 2019-09-16 ENCOUNTER — Other Ambulatory Visit: Payer: Self-pay

## 2019-09-16 VITALS — BP 140/98 | HR 74 | Temp 97.1°F | Ht 63.0 in | Wt 183.6 lb

## 2019-09-16 DIAGNOSIS — R918 Other nonspecific abnormal finding of lung field: Secondary | ICD-10-CM | POA: Diagnosis not present

## 2019-09-16 DIAGNOSIS — G4733 Obstructive sleep apnea (adult) (pediatric): Secondary | ICD-10-CM

## 2019-09-16 DIAGNOSIS — J449 Chronic obstructive pulmonary disease, unspecified: Secondary | ICD-10-CM | POA: Diagnosis not present

## 2019-09-16 NOTE — Patient Instructions (Signed)
Order- schedule CT chest, no contrast    Dx lung nodule, COPD mixed type  Order- schedule PFT    Dx COPD mixed type  Order- Patient would like to change DME to re-establish with [facility] in [place] for CPAP support. Continue current machine CPAP 8, mask of choice, humidifier, supplies. [employer]/ card  Please call if we can help

## 2019-09-16 NOTE — Progress Notes (Signed)
Patient ID: [name] [name], female    DOB: 16-Aug-1941, 78 y.o.   MRN: [id number]  HPI F former smoker- Followed for allergic rhinitis, hypersomnia w/ OSA, COPD, complicated by DM, HBP, GERD NPSG 10/10/06 RDI/AHI 15.6/hr PFT 12/09/2015-moderate obstruction, diffusion severely reduced.  FVC 1.6/59%, FEV1 1.18/58%, ratio 0.74, TLC 81%, DLCO 42%  ----------------------------------------------------------------------------  09/15/2018-78 year old female former smoker followed for OSA, COPD, allergic rhinitis, complicated by DM 2, HBP, GERD HBP, CPAP 8/Advanced -----OSA; DME: [facility]. Pt wears CPAP nightly and DL attached. No new supplies needed.  Anoro Ellipta, albuterol HFA, Download compliance 97%, AHI 1.0/hour.  We discussed mask comfort and long-term use issues.  She has no complaints or changes to request.  Has had flu shot  09/16/2019- 78 year old female former smoker followed for OSA, COPD, allergic rhinitis, complicated by DM 2, HBP, GERD HBP, CPAP 8/Advanced -----OSA on CPAP 8, DME: none currently - pt states she is currently using spouse's old machine from the [place] d/t insurance coverage issues [facility] Endoscopy with anesthesia 8/19.  Covid neg. Some confusion about which DME she can work with for CPAP support Easier DOE esp through Covid mask, without cough or wheeze.. Not using either inhaler. Has had flu vax.  CT chest 2018 showed small nodules, largest 4.6 mm. She was told she has aged out of low dose screening program.  Review of Systems- see HPI    + = positive Constitutional:   No-   weight loss, night sweats, fevers, chills, fatigue, lassitude. HEENT:   No-   headaches, difficulty swallowing, tooth/dental problems, sore throat,                  No-   sneezing, itching, ear ache, no-nasal congestion, post nasal drip,  CV:  No-   chest pain, orthopnea, PND, swelling in lower extremities, anasarca, dizziness, palpitations GI:  No-   heartburn, indigestion, abdominal pain, nausea,  vomiting,  Resp:   No-  excess mucus,             No-   productive cough,  No non-productive cough,  No-  coughing up of blood.              No-   change in color of mucus.  No- wheezing.   Skin: No-   rash or lesions. GU:  MS:  No-   joint pain or swelling.  Psych:  No- change in mood or affect. No depression or anxiety.  No memory loss.   Objective:   Physical Exam General- Alert, Oriented, Affect-appropriate, Distress- none acute, +overweight Skin- rash-none, lesions- none, excoriation- none Lymphadenopathy- none Head- atraumatic            Eyes- Gross vision intact, PERRLA, conjunctivae clear secretions            Ears- Hearing, canals-normal            Nose- Clear, no-Septal dev, mucus, polyps, erosion, perforation             Throat- Mallampati III-IV , mucosa-not unusually dry , drainage- none,  tonsils- atrophic Neck- flexible , trachea midline, no stridor , thyroid nl, carotid no bruit Chest - symmetrical excursion , unlabored           Heart/CV- RRR , no murmur , no gallop  , no rub, nl s1 s2                           - JVD- none ,  edema- none, stasis changes- none, varices- none           Lung- + clear, unlabored, no-wheezing,cough- none, dullness-none, rub- none           Chest wall-  Abd-  Br/ Gen/ Rectal- Not done, not indicated Extrem- cyanosis- none, clubbing, none, atrophy- none, strength- nl Neuro- grossly intact to observation

## 2019-09-20 DIAGNOSIS — R918 Other nonspecific abnormal finding of lung field: Secondary | ICD-10-CM | POA: Insufficient documentation

## 2019-09-20 NOTE — Assessment & Plan Note (Signed)
Aged out of low dose cancer screening CT program. Plan- CT chest, no contrast

## 2019-09-20 NOTE — Assessment & Plan Note (Signed)
She benefits from CPAP and using machine every night. Wants to re-establish with [employer] for CPAP support.

## 2019-09-20 NOTE — Assessment & Plan Note (Signed)
Aware of dyspnea on exertion, not acute, without cough or wheeze. Wasn't finding inhalers made any difference. Likely mostly emphysema. Plan- watch for changes. Consider another trial of ? Trelegy in the future.

## 2019-09-24 ENCOUNTER — Telehealth: Payer: Self-pay | Admitting: Internal Medicine

## 2019-09-24 NOTE — Telephone Encounter (Signed)
Call returned to patient, she states she is returning [facility] call for covid scheduling for PFT 10/2. Made aware I would get message to [facility].   [facility] please give patient a call back. Thanks.

## 2019-09-25 ENCOUNTER — Other Ambulatory Visit: Payer: Self-pay | Admitting: Internal Medicine

## 2019-09-25 NOTE — Telephone Encounter (Signed)
Called and spoke to patient. Scheduled covid testing. Nothing further needed at this time.

## 2019-09-29 ENCOUNTER — Other Ambulatory Visit (HOSPITAL_COMMUNITY)
Admission: RE | Admit: 2019-09-29 | Discharge: 2019-09-29 | Disposition: A | Discharge status: Home / Self Care | Payer: Medicare Other | Source: Ambulatory Visit | Attending: Internal Medicine | Admitting: Internal Medicine

## 2019-09-29 DIAGNOSIS — Z01812 Encounter for preprocedural laboratory examination: Secondary | ICD-10-CM | POA: Diagnosis not present

## 2019-09-29 DIAGNOSIS — Z20828 Contact with and (suspected) exposure to other viral communicable diseases: Secondary | ICD-10-CM | POA: Diagnosis not present

## 2019-09-29 LAB — SARS CORONAVIRUS 2 (TAT 6-24 HRS): SARS Coronavirus 2: NEGATIVE

## 2019-10-02 ENCOUNTER — Other Ambulatory Visit: Payer: Self-pay | Admitting: *Deleted

## 2019-10-02 ENCOUNTER — Other Ambulatory Visit: Payer: Self-pay

## 2019-10-02 ENCOUNTER — Ambulatory Visit (INDEPENDENT_AMBULATORY_CARE_PROVIDER_SITE_OTHER): Payer: Medicare Other | Admitting: Internal Medicine

## 2019-10-02 DIAGNOSIS — J449 Chronic obstructive pulmonary disease, unspecified: Secondary | ICD-10-CM | POA: Diagnosis not present

## 2019-10-02 LAB — PULMONARY FUNCTION TEST
DL/VA % pred: 100 %
DL/VA: 4.13 ml/min/mmHg/L
DLCO unc % pred: 77 %
DLCO unc: 14.13 ml/min/mmHg
FEF 25-75 Post: 1.57 L/sec
FEF 25-75 Pre: 1.38 L/sec
FEF2575-%Change-Post: 13 %
FEF2575-%Pred-Post: 107 %
FEF2575-%Pred-Pre: 95 %
FEV1-%Change-Post: 5 %
FEV1-%Pred-Post: 81 %
FEV1-%Pred-Pre: 77 %
FEV1-Post: 1.57 L
FEV1-Pre: 1.49 L
FEV1FVC-%Change-Post: 6 %
FEV1FVC-%Pred-Pre: 105 %
FEV6-%Change-Post: -1 %
FEV6-%Pred-Post: 77 %
FEV6-%Pred-Pre: 78 %
FEV6-Post: 1.88 L
FEV6-Pre: 1.9 L
FEV6FVC-%Pred-Post: 105 %
FEV6FVC-%Pred-Pre: 105 %
FVC-%Change-Post: -1 %
FVC-%Pred-Post: 73 %
FVC-%Pred-Pre: 73 %
FVC-Post: 1.88 L
FVC-Pre: 1.9 L
Post FEV1/FVC ratio: 83 %
Post FEV6/FVC ratio: 100 %
Pre FEV1/FVC ratio: 79 %
Pre FEV6/FVC Ratio: 100 %
RV % pred: 82 %
RV: 1.91 L
TLC % pred: 79 %
TLC: 3.88 L

## 2019-10-02 NOTE — Progress Notes (Signed)
Full PFT performed today. °

## 2019-10-02 NOTE — Progress Notes (Signed)
pf

## 2019-10-05 ENCOUNTER — Ambulatory Visit
Admission: RE | Admit: 2019-10-05 | Discharge: 2019-10-05 | Disposition: A | Discharge status: Home / Self Care | Payer: Medicare Other | Source: Ambulatory Visit | Attending: Internal Medicine | Admitting: Internal Medicine

## 2019-10-05 DIAGNOSIS — J449 Chronic obstructive pulmonary disease, unspecified: Secondary | ICD-10-CM

## 2019-10-05 DIAGNOSIS — R918 Other nonspecific abnormal finding of lung field: Secondary | ICD-10-CM

## 2019-10-05 DIAGNOSIS — J439 Emphysema, unspecified: Secondary | ICD-10-CM | POA: Diagnosis not present

## 2019-10-05 IMAGING — CT CT CHEST W/O CM
2 of 4 series · 13 of 36 positions shown, 16 images · non-contrast
Comparison: Chest CT [DATE]

CLINICAL DATA: History of pulmonary nodule.

EXAM:
CT CHEST WITHOUT CONTRAST
TECHNIQUE: Multidetector CT imaging of the chest was performed following the
standard protocol without IV contrast.

[Series 2: chest 2.00 br40 s3 · axial · 0.68mm/px · z∈[+1569,+1811]mm · 10 of 143 slices shown, 13 images (1 of 2)]
[im 11/143  mediastinal]
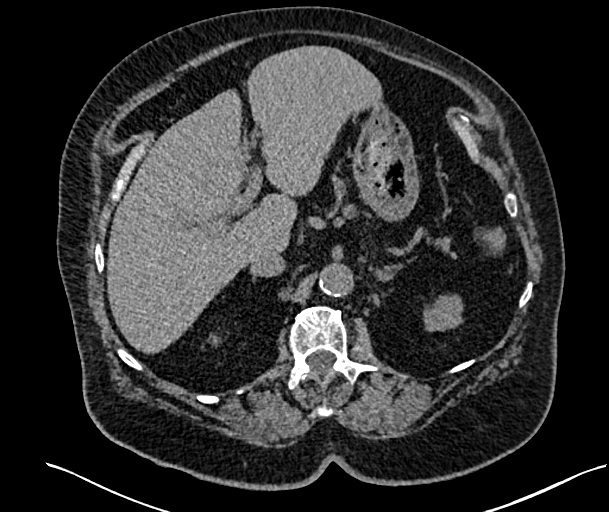
[im 11/143  lung]
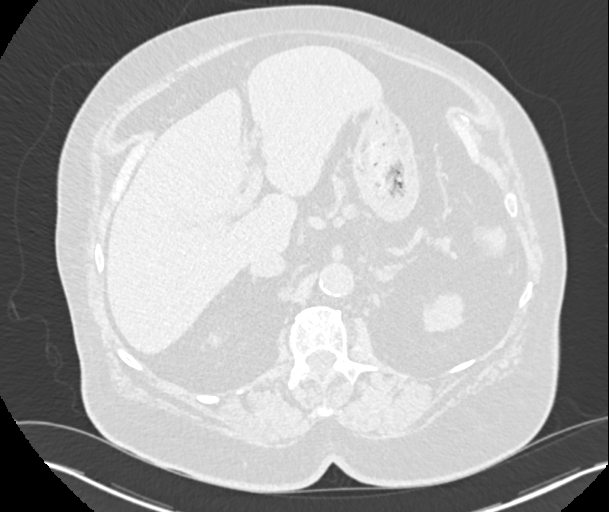
[im 22/143  lung]
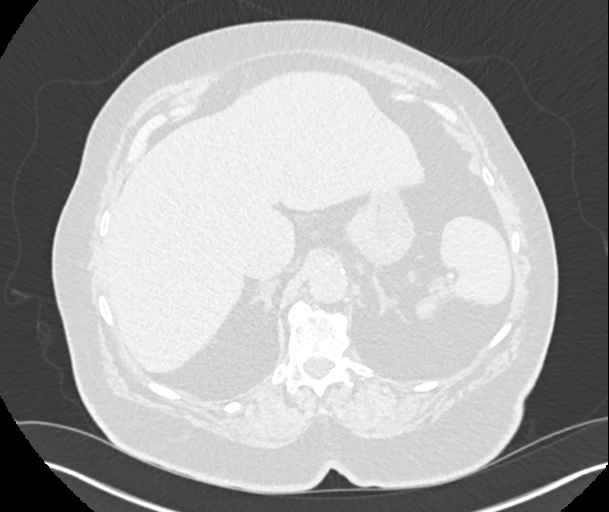
[im 44/143  lung]
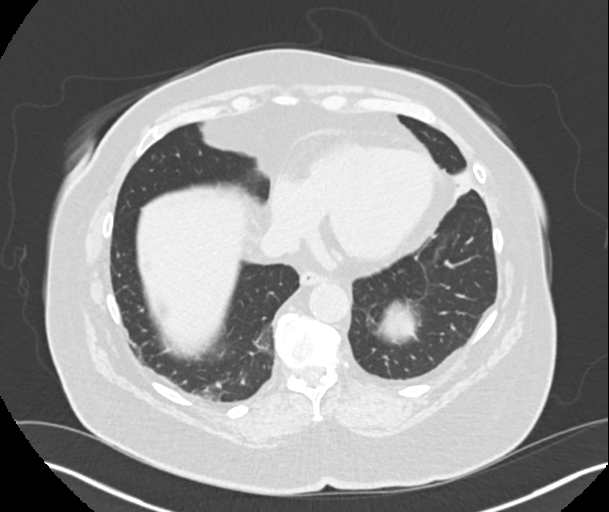
[im 55/143  lung]
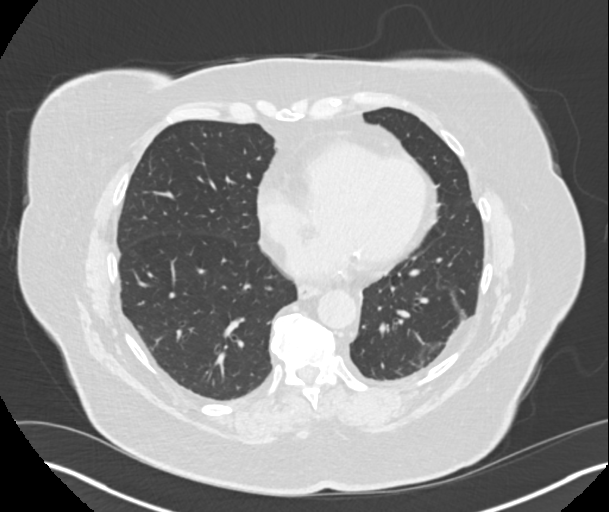
[im 66/143  mediastinal]
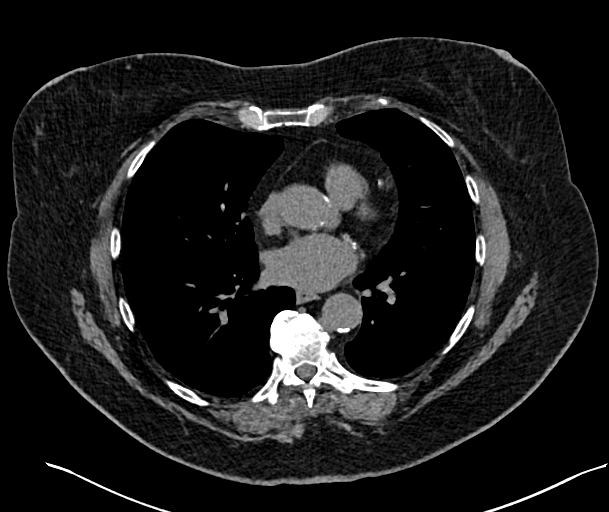
[im 66/143  lung]
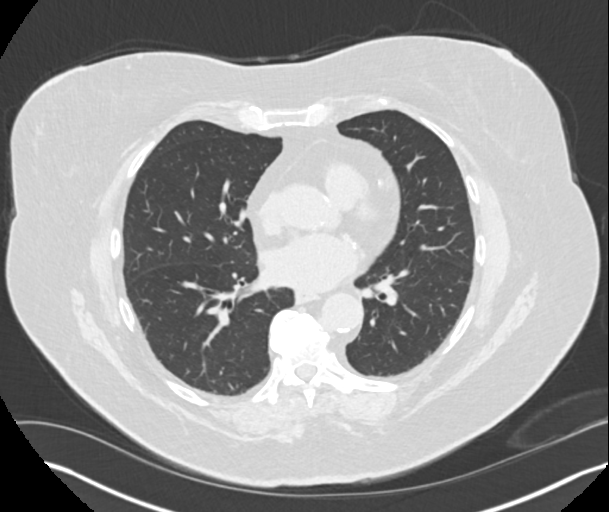
[im 77/143  lung]
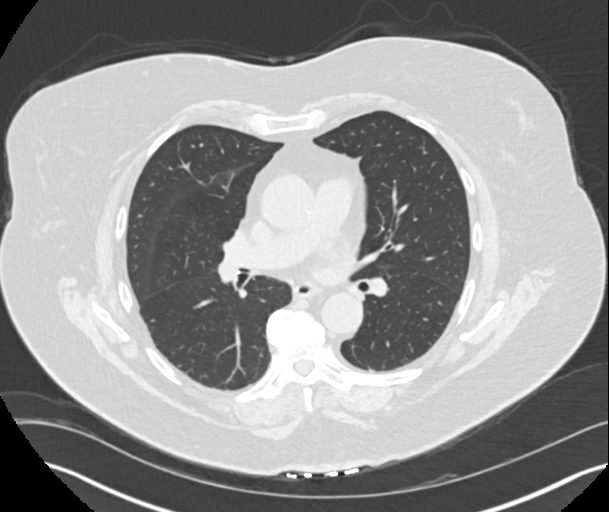
[im 88/143  lung]
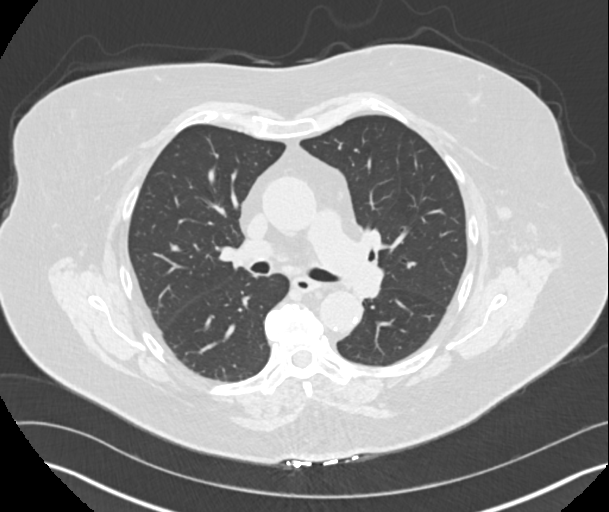
[im 110/143  lung]
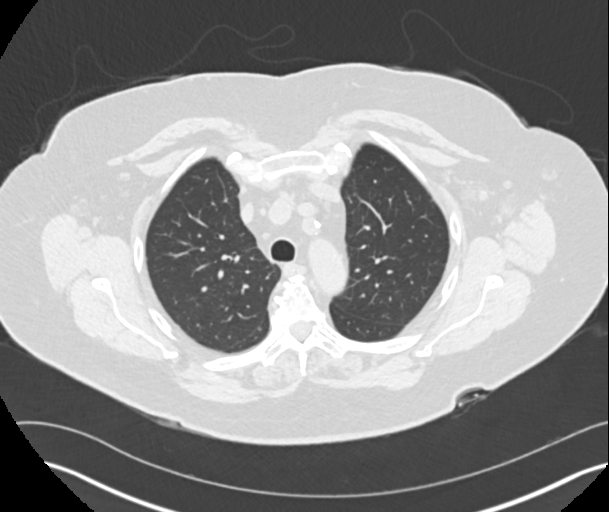
[im 121/143  mediastinal]
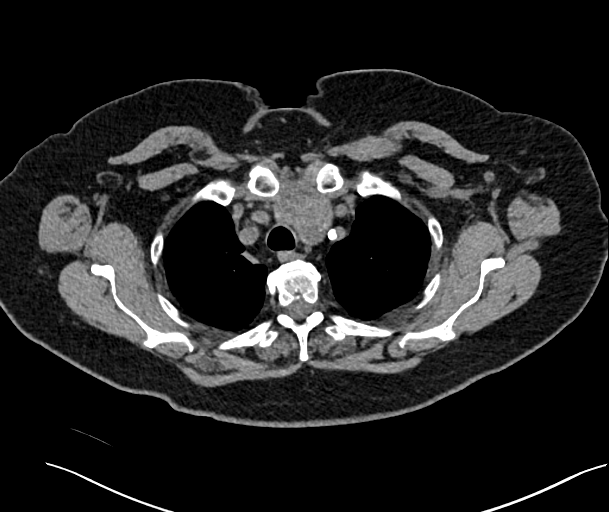
[im 121/143  lung]
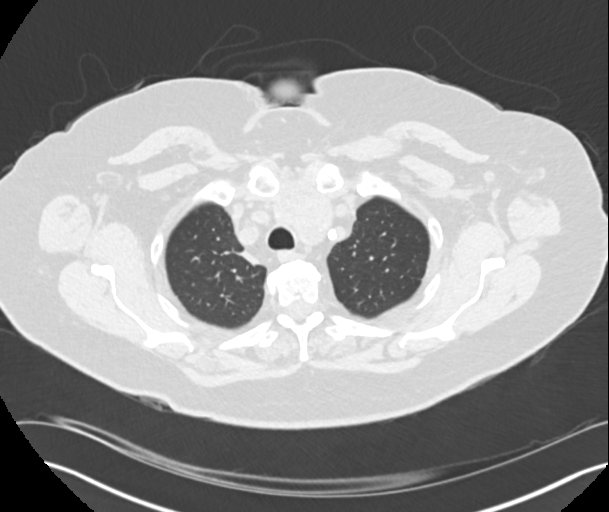
[im 132/143  lung]
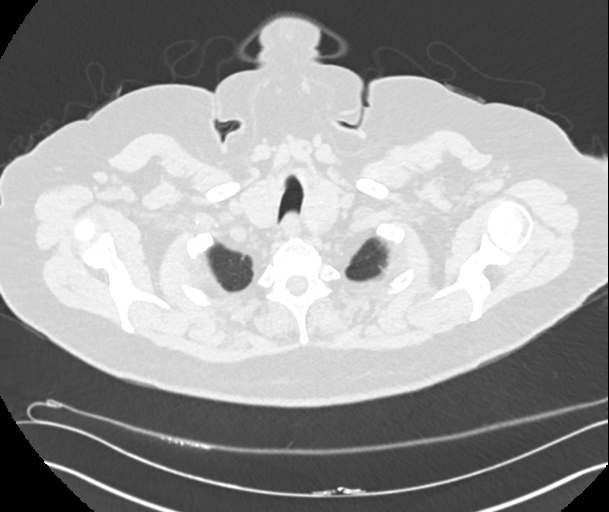

[Series 4: chest 2.00 br40 s3 · coronal · 0.56mm/px · 3 of 174 slices shown (2 of 2)]
[im 35/174  lung]
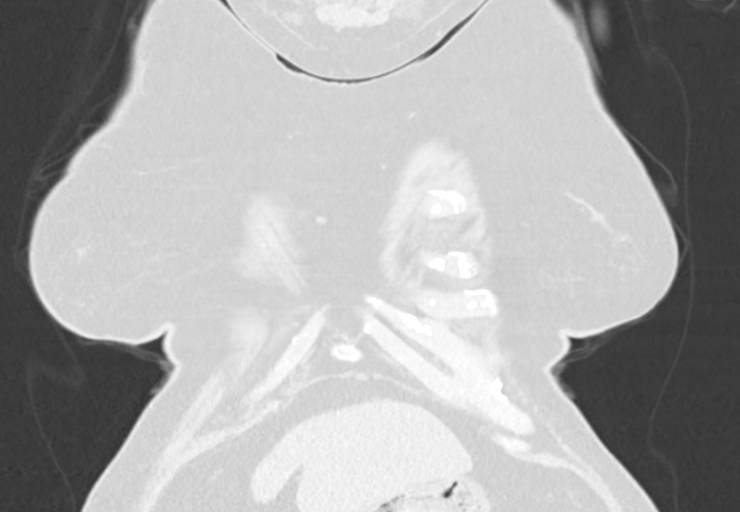
[im 70/174  lung]
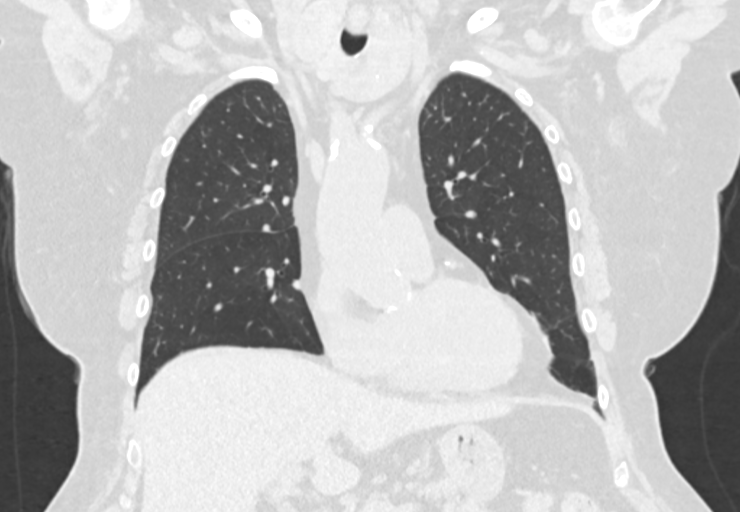
[im 104/174  lung]
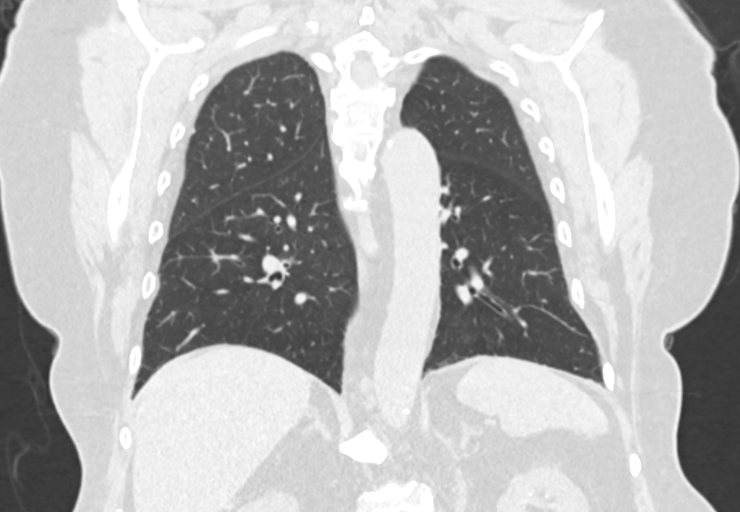

[13 of 36 positions shown; findings below may reference images not displayed]

FINDINGS: Cardiovascular: Similar-appearing enlargement of the left thyroid
lobe. Thoracic aortic and coronary arterial vascular calcifications.
Normal heart size. Trace fluid superior pericardial recess.

Mediastinum/Nodes: No enlarged axillary, mediastinal or hilar
lymphadenopathy. Normal appearance of the esophagus.

Lungs/Pleura: Central airways are patent. Minimal
atelectasis/scarring within the lower lobes bilaterally. Stable 4 mm
left upper lobe nodule (image 32; series a). Stable 3 mm left upper
lobe nodule (image 61; series 8). No pleural effusion or
pneumothorax.

Upper Abdomen: No acute process. Right upper pole renal
nephrolithiasis.

Musculoskeletal: Thoracic spine degenerative changes. No aggressive
or acute appearing osseous lesions.
IMPRESSION: Stable left lung pulmonary nodules most compatible with benign
process given stability over time.

Aortic Atherosclerosis ([MZ]-[MZ]) and Emphysema ([MZ]-[MZ]).

## 2019-10-13 DIAGNOSIS — N184 Chronic kidney disease, stage 4 (severe): Secondary | ICD-10-CM | POA: Diagnosis not present

## 2019-10-13 DIAGNOSIS — Z794 Long term (current) use of insulin: Secondary | ICD-10-CM | POA: Diagnosis not present

## 2019-10-13 DIAGNOSIS — M545 Low back pain: Secondary | ICD-10-CM | POA: Diagnosis not present

## 2019-10-13 DIAGNOSIS — H6691 Otitis media, unspecified, right ear: Secondary | ICD-10-CM | POA: Diagnosis not present

## 2019-10-13 DIAGNOSIS — H9201 Otalgia, right ear: Secondary | ICD-10-CM | POA: Diagnosis not present

## 2019-10-13 DIAGNOSIS — I129 Hypertensive chronic kidney disease with stage 1 through stage 4 chronic kidney disease, or unspecified chronic kidney disease: Secondary | ICD-10-CM | POA: Diagnosis not present

## 2019-10-13 DIAGNOSIS — M5416 Radiculopathy, lumbar region: Secondary | ICD-10-CM | POA: Diagnosis not present

## 2019-10-13 DIAGNOSIS — E1129 Type 2 diabetes mellitus with other diabetic kidney complication: Secondary | ICD-10-CM | POA: Diagnosis not present

## 2019-11-10 DIAGNOSIS — E1129 Type 2 diabetes mellitus with other diabetic kidney complication: Secondary | ICD-10-CM | POA: Diagnosis not present

## 2019-11-10 DIAGNOSIS — I129 Hypertensive chronic kidney disease with stage 1 through stage 4 chronic kidney disease, or unspecified chronic kidney disease: Secondary | ICD-10-CM | POA: Diagnosis not present

## 2019-11-10 DIAGNOSIS — Z794 Long term (current) use of insulin: Secondary | ICD-10-CM | POA: Diagnosis not present

## 2019-11-10 DIAGNOSIS — N184 Chronic kidney disease, stage 4 (severe): Secondary | ICD-10-CM | POA: Diagnosis not present

## 2019-11-19 DIAGNOSIS — H2511 Age-related nuclear cataract, right eye: Secondary | ICD-10-CM | POA: Diagnosis not present

## 2019-11-19 DIAGNOSIS — H2512 Age-related nuclear cataract, left eye: Secondary | ICD-10-CM | POA: Diagnosis not present

## 2019-11-19 DIAGNOSIS — E119 Type 2 diabetes mellitus without complications: Secondary | ICD-10-CM | POA: Diagnosis not present

## 2019-11-19 DIAGNOSIS — Z01818 Encounter for other preprocedural examination: Secondary | ICD-10-CM | POA: Diagnosis not present

## 2019-12-10 DIAGNOSIS — H2511 Age-related nuclear cataract, right eye: Secondary | ICD-10-CM | POA: Diagnosis not present

## 2020-01-11 DIAGNOSIS — R82998 Other abnormal findings in urine: Secondary | ICD-10-CM | POA: Diagnosis not present

## 2020-01-13 DIAGNOSIS — I2510 Atherosclerotic heart disease of native coronary artery without angina pectoris: Secondary | ICD-10-CM | POA: Diagnosis not present

## 2020-01-13 DIAGNOSIS — E669 Obesity, unspecified: Secondary | ICD-10-CM | POA: Diagnosis not present

## 2020-01-13 DIAGNOSIS — D72829 Elevated white blood cell count, unspecified: Secondary | ICD-10-CM | POA: Diagnosis not present

## 2020-01-13 DIAGNOSIS — I129 Hypertensive chronic kidney disease with stage 1 through stage 4 chronic kidney disease, or unspecified chronic kidney disease: Secondary | ICD-10-CM | POA: Diagnosis not present

## 2020-01-13 DIAGNOSIS — M8580 Other specified disorders of bone density and structure, unspecified site: Secondary | ICD-10-CM | POA: Diagnosis not present

## 2020-01-13 DIAGNOSIS — J449 Chronic obstructive pulmonary disease, unspecified: Secondary | ICD-10-CM | POA: Diagnosis not present

## 2020-01-13 DIAGNOSIS — N184 Chronic kidney disease, stage 4 (severe): Secondary | ICD-10-CM | POA: Diagnosis not present

## 2020-01-13 DIAGNOSIS — Z0000 Encounter for general adult medical examination without abnormal findings: Secondary | ICD-10-CM | POA: Diagnosis not present

## 2020-01-13 DIAGNOSIS — E559 Vitamin D deficiency, unspecified: Secondary | ICD-10-CM | POA: Diagnosis not present

## 2020-01-13 DIAGNOSIS — Z1331 Encounter for screening for depression: Secondary | ICD-10-CM | POA: Diagnosis not present

## 2020-01-13 DIAGNOSIS — Z1339 Encounter for screening examination for other mental health and behavioral disorders: Secondary | ICD-10-CM | POA: Diagnosis not present

## 2020-01-13 DIAGNOSIS — M545 Low back pain: Secondary | ICD-10-CM | POA: Diagnosis not present

## 2020-01-13 DIAGNOSIS — E785 Hyperlipidemia, unspecified: Secondary | ICD-10-CM | POA: Diagnosis not present

## 2020-01-15 ENCOUNTER — Other Ambulatory Visit (HOSPITAL_COMMUNITY): Payer: Self-pay | Admitting: Internal Medicine

## 2020-01-15 DIAGNOSIS — Z1231 Encounter for screening mammogram for malignant neoplasm of breast: Secondary | ICD-10-CM

## 2020-01-19 DIAGNOSIS — Z20822 Contact with and (suspected) exposure to covid-19: Secondary | ICD-10-CM | POA: Diagnosis not present

## 2020-01-25 ENCOUNTER — Other Ambulatory Visit: Payer: Self-pay

## 2020-01-25 ENCOUNTER — Ambulatory Visit (HOSPITAL_COMMUNITY)
Admission: RE | Admit: 2020-01-25 | Discharge: 2020-01-25 | Disposition: A | Discharge status: Home / Self Care | Payer: Medicare Other | Source: Ambulatory Visit | Attending: Internal Medicine | Admitting: Internal Medicine

## 2020-01-25 DIAGNOSIS — Z1231 Encounter for screening mammogram for malignant neoplasm of breast: Secondary | ICD-10-CM | POA: Diagnosis not present

## 2020-01-25 IMAGING — MG DIGITAL SCREENING BILAT W/ TOMO W/ CAD
6 of 12 series · 6 of 36 positions shown · non-contrast
Comparison: Previous exam(s).

CLINICAL DATA: Screening.

EXAM:
DIGITAL SCREENING BILATERAL MAMMOGRAM WITH TOMO AND CAD

[L CC synth-2D]
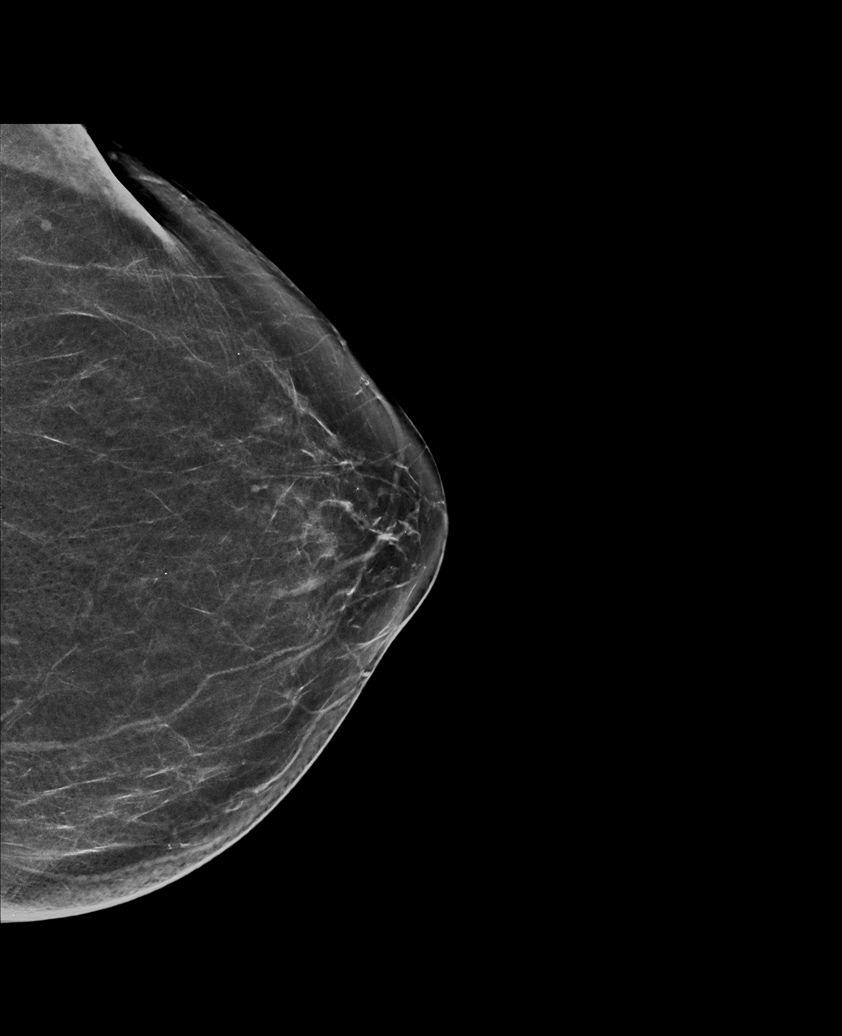

[L MLO synth-2D (1 of 2)]
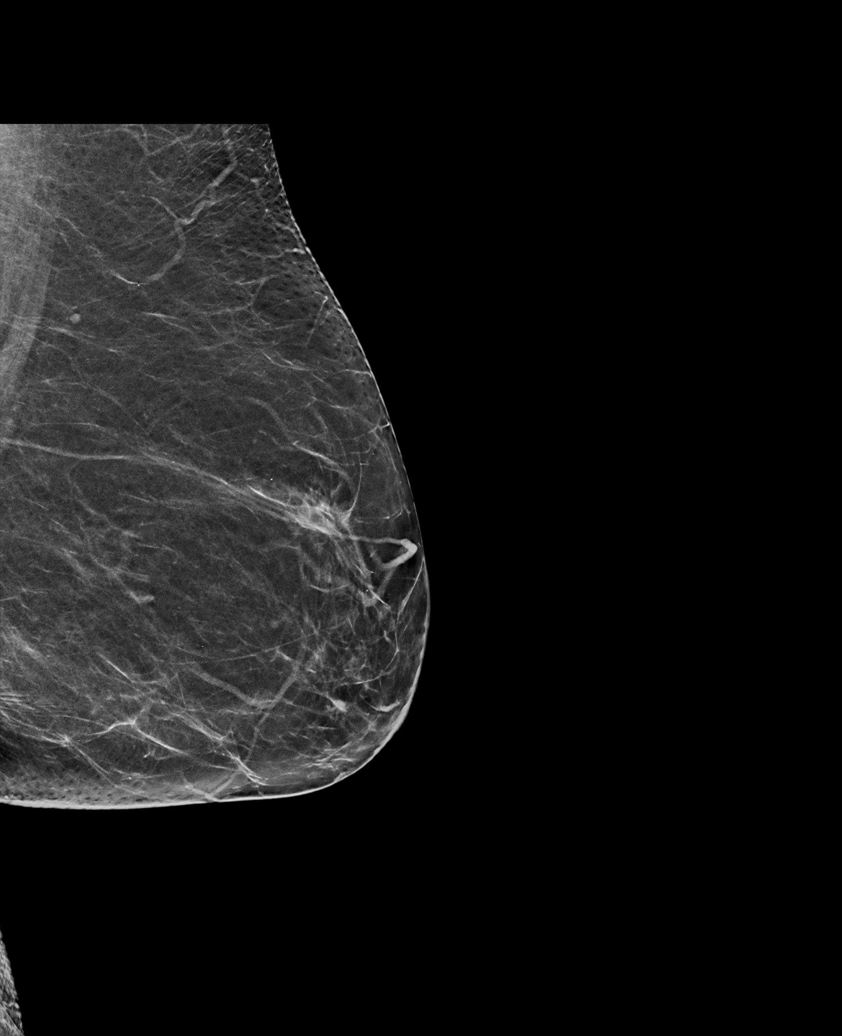

[R MLO synth-2D]
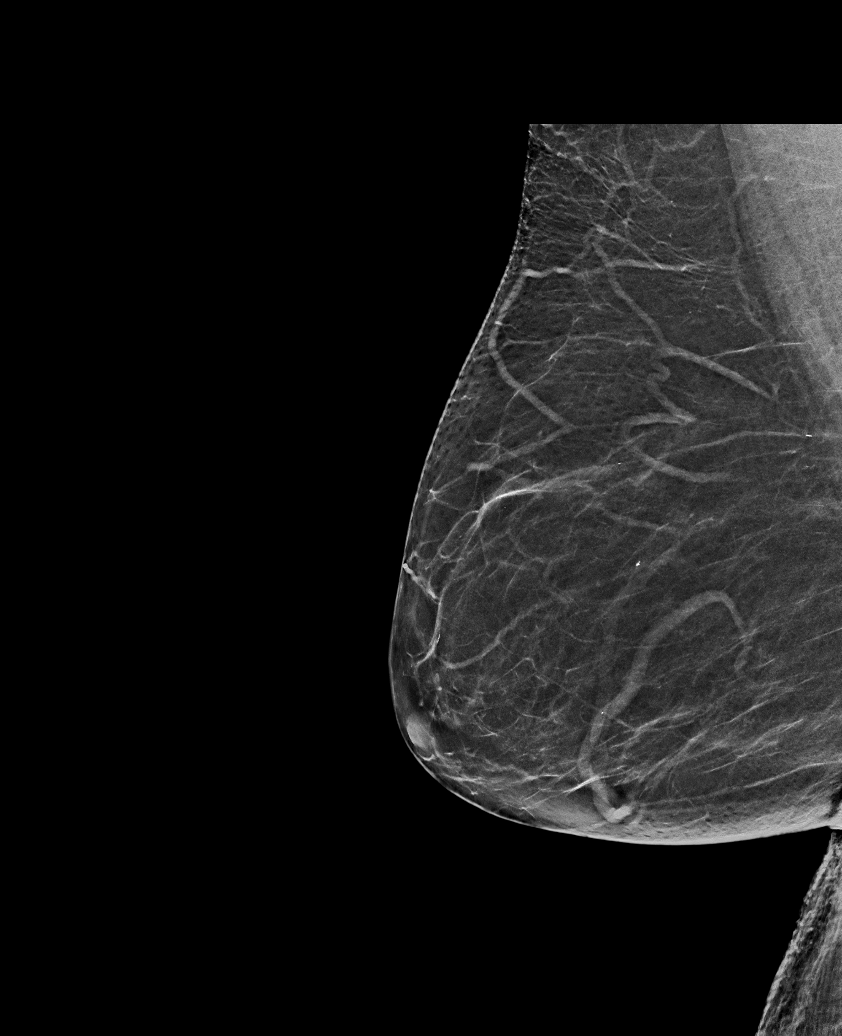

[R CC synth-2D]
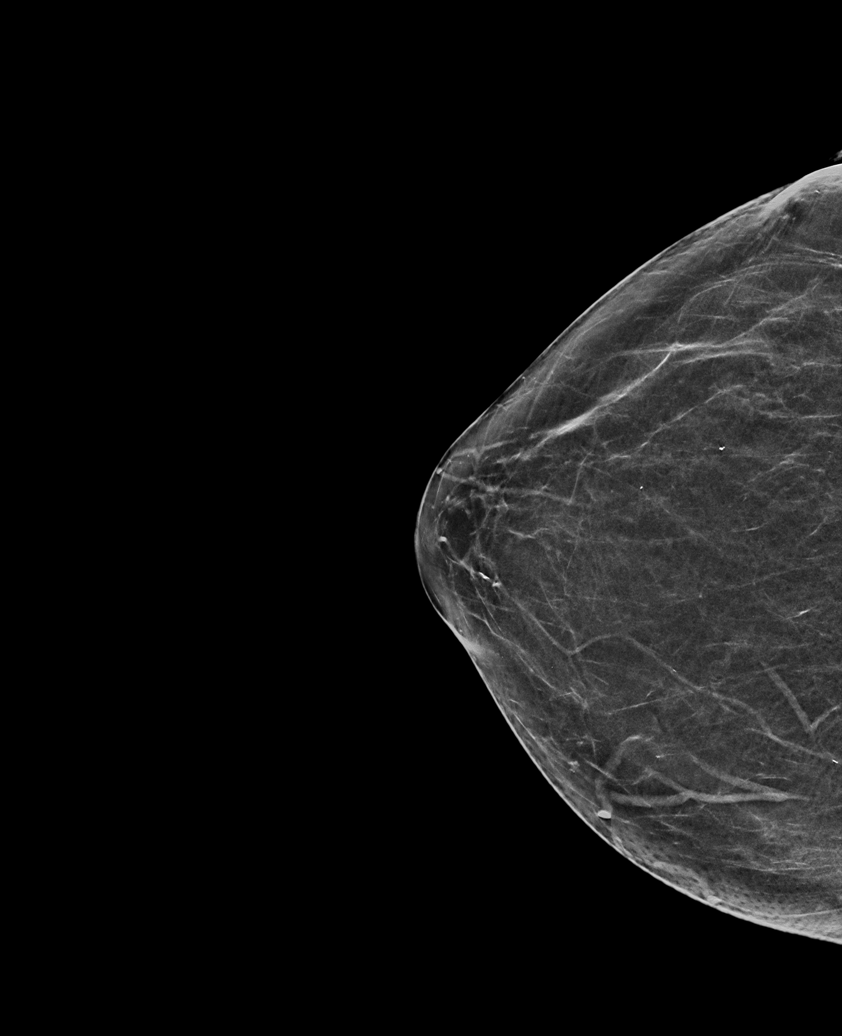

[L MLO synth-2D (2 of 2)]
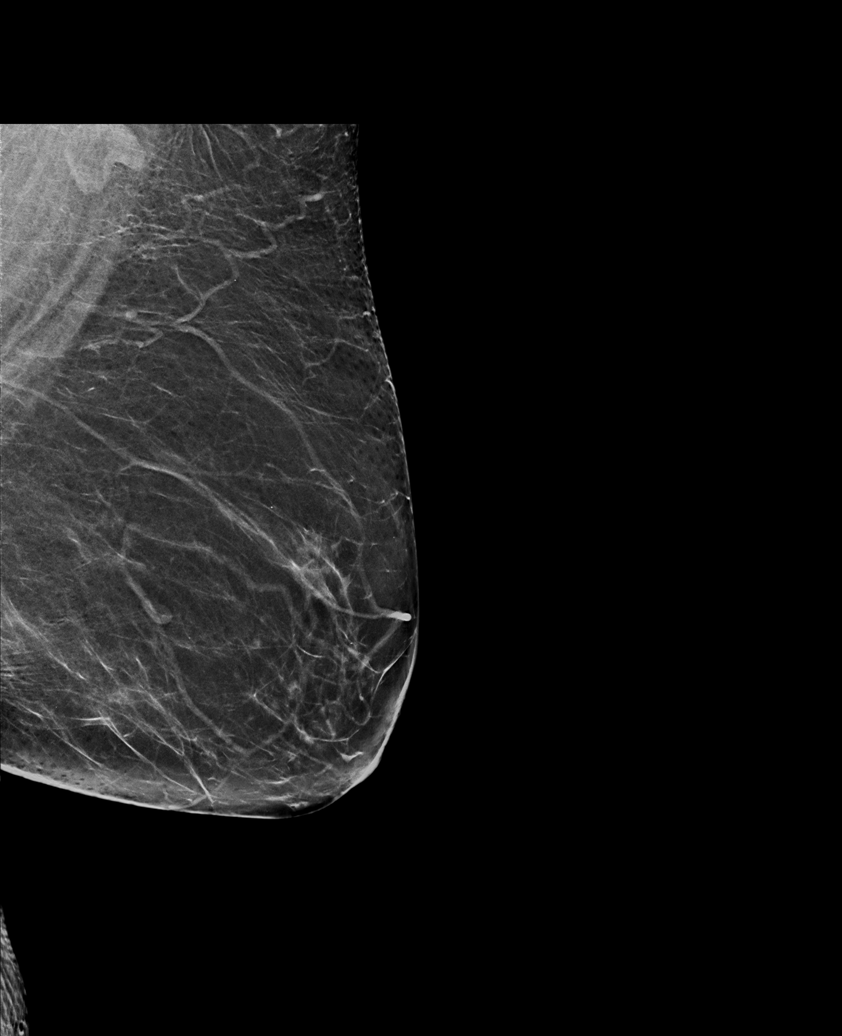

[L XCCL synth-2D]
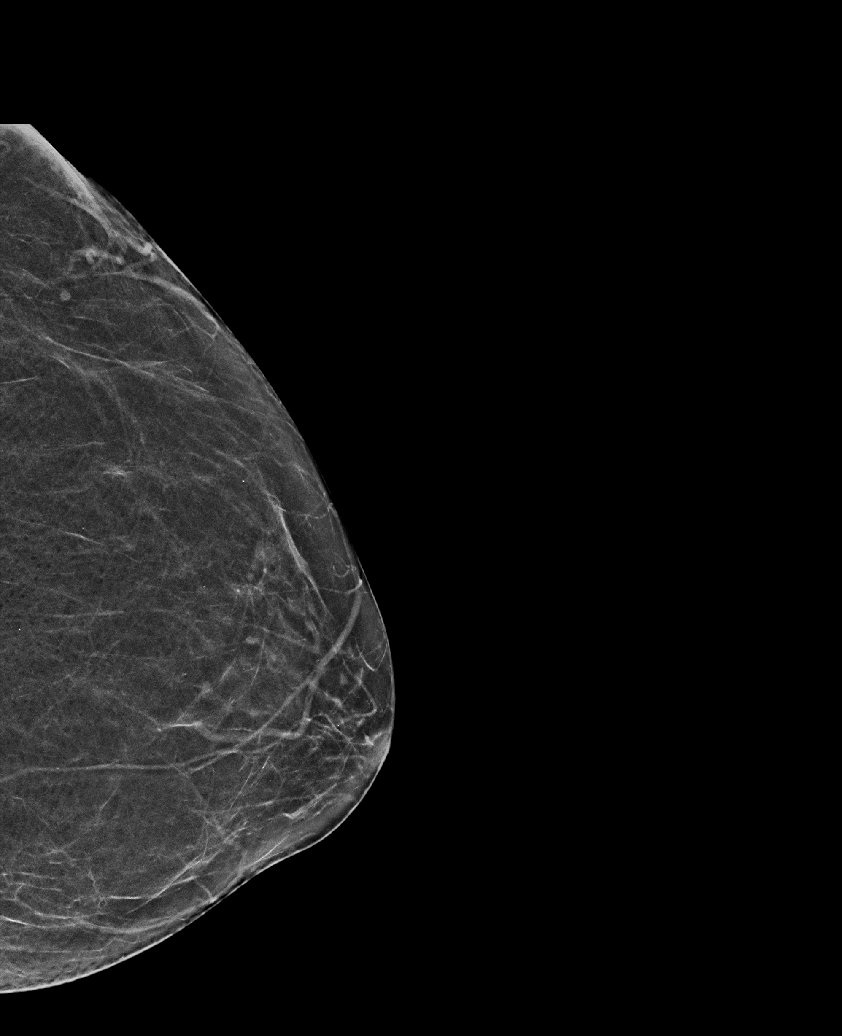

[6 of 36 positions shown; findings below may reference images not displayed]

ACR Breast Density Category b: There are scattered areas of
fibroglandular density.
FINDINGS: There are no findings suspicious for malignancy. Images were
processed with CAD.
IMPRESSION: No mammographic evidence of malignancy. A result letter of this
screening mammogram will be mailed directly to the patient.

RECOMMENDATION:
Screening mammogram in one year. (Code:[TQ])

BI-RADS CATEGORY  1: Negative.

## 2020-02-25 DIAGNOSIS — I129 Hypertensive chronic kidney disease with stage 1 through stage 4 chronic kidney disease, or unspecified chronic kidney disease: Secondary | ICD-10-CM | POA: Diagnosis not present

## 2020-02-25 DIAGNOSIS — N184 Chronic kidney disease, stage 4 (severe): Secondary | ICD-10-CM | POA: Diagnosis not present

## 2020-02-25 DIAGNOSIS — E1129 Type 2 diabetes mellitus with other diabetic kidney complication: Secondary | ICD-10-CM | POA: Diagnosis not present

## 2020-02-25 DIAGNOSIS — Z794 Long term (current) use of insulin: Secondary | ICD-10-CM | POA: Diagnosis not present

## 2020-02-26 DIAGNOSIS — N184 Chronic kidney disease, stage 4 (severe): Secondary | ICD-10-CM | POA: Diagnosis not present

## 2020-02-26 DIAGNOSIS — R809 Proteinuria, unspecified: Secondary | ICD-10-CM | POA: Diagnosis not present

## 2020-02-26 DIAGNOSIS — Z87442 Personal history of urinary calculi: Secondary | ICD-10-CM | POA: Diagnosis not present

## 2020-02-26 DIAGNOSIS — D509 Iron deficiency anemia, unspecified: Secondary | ICD-10-CM | POA: Diagnosis not present

## 2020-02-26 DIAGNOSIS — I129 Hypertensive chronic kidney disease with stage 1 through stage 4 chronic kidney disease, or unspecified chronic kidney disease: Secondary | ICD-10-CM | POA: Diagnosis not present

## 2020-02-26 DIAGNOSIS — E1122 Type 2 diabetes mellitus with diabetic chronic kidney disease: Secondary | ICD-10-CM | POA: Diagnosis not present

## 2020-03-15 ENCOUNTER — Encounter: Payer: Self-pay | Admitting: Internal Medicine

## 2020-03-16 ENCOUNTER — Ambulatory Visit (INDEPENDENT_AMBULATORY_CARE_PROVIDER_SITE_OTHER): Payer: Medicare Other | Admitting: Internal Medicine

## 2020-03-16 ENCOUNTER — Encounter: Payer: Self-pay | Admitting: Internal Medicine

## 2020-03-16 ENCOUNTER — Other Ambulatory Visit: Payer: Self-pay

## 2020-03-16 DIAGNOSIS — J449 Chronic obstructive pulmonary disease, unspecified: Secondary | ICD-10-CM | POA: Diagnosis not present

## 2020-03-16 DIAGNOSIS — R918 Other nonspecific abnormal finding of lung field: Secondary | ICD-10-CM | POA: Diagnosis not present

## 2020-03-16 DIAGNOSIS — G4733 Obstructive sleep apnea (adult) (pediatric): Secondary | ICD-10-CM | POA: Diagnosis not present

## 2020-03-16 NOTE — Patient Instructions (Signed)
We can continue CPAP 8, mask of choice, humidifier, supplies, Airview/ card  We will refill your inhalers as needed- just let us know.  Please call if we can help

## 2020-03-16 NOTE — Assessment & Plan Note (Signed)
Aware of DOE, but not worse despite little exercise since Covid restrictions. No cough or wheeze. Plan- she will call for inhaler refills if needed, esp w pollen season here.

## 2020-03-16 NOTE — Assessment & Plan Note (Signed)
Benefits from CPAP. Using machine that [place] provided friend Derenda Fennel, reset for her. Download is her data.  Plan- continue CPAP 8

## 2020-03-16 NOTE — Progress Notes (Signed)
Patient ID: [name] [name], female    DOB: Oct 17, 1941, 79 y.o.   MRN: [id number]  HPI F former smoker- Followed for allergic rhinitis, hypersomnia w/ OSA, COPD, complicated by DM, HBP, GERD NPSG 10/10/06 RDI/AHI 15.6/hr PFT 12/09/2015-moderate obstruction, diffusion severely reduced.  FVC 1.6/59%, FEV1 1.18/58%, ratio 0.74, TLC 81%, DLCO 42%  ----------------------------------------------------------------------------   09/16/2019- 79 year old female former smoker followed for OSA, COPD, allergic rhinitis, complicated by DM 2, HBP, GERD HBP, CPAP 8/[facility] -----OSA on CPAP 8, DME: none currently - pt states she is currently using spouse's old machine from the [place] d/t insurance coverage issues Tolerated Endoscopy with anesthesia 8/19.  Covid neg. Some confusion about which DME she can work with for CPAP support Easier DOE esp through Covid mask, without cough or wheeze.. Not using either inhaler. Has had flu vax.  CT chest 2018 showed small nodules, largest 4.6 mm. She was told she has aged out of low dose screening program.  03/16/20- 79 year old female former smoker followed for OSA, COPD, Lung Nodules, allergic rhinitis, complicated by DM 2, HBP, GERD, HBP, Aortic Atherosclerosis,  -----f/u OSA/COPD.  CPAP 8/ [employer]   (Name on SD card is Derenda Fennel- she had his machine reset for her) Download- compliance 93%, AHI 0.3/ hr Uses CPAP every day. Sleeping well. Occ nap. Frustrated but coping with Covid precautions. Had Moderna Covax x 2 Body weight today 184 lbs Anoro, albuterol hfa, Melatonin Notes stable DOE, getting little exercise. No cough or wheeze. Hasn't been using inhaler Lung nodules stable, c/w benign. CT chest 10/05/2019- IMPRESSION: Stable left lung pulmonary nodules most compatible with benign process given stability over time. Aortic Atherosclerosis (ICD10-I70.0) and Emphysema (ICD10-J43.9).  Review of Systems- see HPI    + = positive Constitutional:   No-   weight  loss, night sweats, fevers, chills, fatigue, lassitude. HEENT:   No-   headaches, difficulty swallowing, tooth/dental problems, sore throat,                  No-   sneezing, itching, ear ache, no-nasal congestion, post nasal drip,  CV:  No-   chest pain, orthopnea, PND, swelling in lower extremities, anasarca, dizziness, palpitations GI:  No-   heartburn, indigestion, abdominal pain, nausea, vomiting,  Resp:   No-  excess mucus,             No-   productive cough,  No non-productive cough,  No-  coughing up of blood.              No-   change in color of mucus.  No- wheezing.   Skin: No-   rash or lesions. GU:  MS:  No-   joint pain or swelling.  Psych:  No- change in mood or affect. No depression or anxiety.  No memory loss.   Objective:   Physical Exam General- Alert, Oriented, Affect-appropriate, Distress- none acute, +overweight Skin- rash-none, lesions- none, excoriation- none Lymphadenopathy- none Head- atraumatic            Eyes- Gross vision intact, PERRLA, conjunctivae clear secretions            Ears- Hearing, canals-normal            Nose- Clear, no-Septal dev, mucus, polyps, erosion, perforation             Throat- Mallampati III-IV , mucosa-not unusually dry , drainage- none,  tonsils- atrophic Neck- flexible , trachea midline, no stridor , thyroid nl, carotid no bruit Chest - symmetrical  excursion , unlabored           Heart/CV- RRR , no murmur , no gallop  , no rub, nl s1 s2                           - JVD- none , edema- none, stasis changes- none, varices- none           Lung- + clear, unlabored, no-wheezing,cough- none, dullness-none, rub- none           Chest wall-  Abd-  Br/ Gen/ Rectal- Not done, not indicated Extrem- cyanosis- none, clubbing, none, atrophy- none, strength- nl Neuro- grossly intact to observation

## 2020-03-16 NOTE — Assessment & Plan Note (Signed)
Benign based on CT stability.  Plan- occasional CXR as needed

## 2020-03-22 ENCOUNTER — Other Ambulatory Visit: Payer: Self-pay | Admitting: Gastroenterology

## 2020-03-25 ENCOUNTER — Other Ambulatory Visit (HOSPITAL_COMMUNITY)
Admission: RE | Admit: 2020-03-25 | Discharge: 2020-03-25 | Disposition: A | Discharge status: Home / Self Care | Payer: Medicare Other | Source: Ambulatory Visit | Attending: Gastroenterology | Admitting: Gastroenterology

## 2020-03-25 DIAGNOSIS — Z01812 Encounter for preprocedural laboratory examination: Secondary | ICD-10-CM | POA: Diagnosis not present

## 2020-03-25 DIAGNOSIS — Z20822 Contact with and (suspected) exposure to covid-19: Secondary | ICD-10-CM | POA: Insufficient documentation

## 2020-03-25 LAB — SARS CORONAVIRUS 2 (TAT 6-24 HRS): SARS Coronavirus 2: NEGATIVE

## 2020-03-29 ENCOUNTER — Ambulatory Visit (HOSPITAL_COMMUNITY)
Admission: RE | Admit: 2020-03-29 | Discharge: 2020-03-29 | Disposition: A | Discharge status: Home / Self Care | Payer: Medicare Other | Attending: Gastroenterology | Admitting: Gastroenterology

## 2020-03-29 ENCOUNTER — Encounter (HOSPITAL_COMMUNITY)
Admission: RE | Disposition: A | Discharge status: Home / Self Care | Payer: Self-pay | Source: Home / Self Care | Attending: Gastroenterology

## 2020-03-29 ENCOUNTER — Ambulatory Visit (HOSPITAL_COMMUNITY): Payer: Medicare Other | Admitting: Registered Nurse

## 2020-03-29 ENCOUNTER — Encounter (HOSPITAL_COMMUNITY): Payer: Self-pay | Admitting: Gastroenterology

## 2020-03-29 ENCOUNTER — Other Ambulatory Visit: Payer: Self-pay

## 2020-03-29 DIAGNOSIS — G4730 Sleep apnea, unspecified: Secondary | ICD-10-CM | POA: Diagnosis not present

## 2020-03-29 DIAGNOSIS — F329 Major depressive disorder, single episode, unspecified: Secondary | ICD-10-CM | POA: Insufficient documentation

## 2020-03-29 DIAGNOSIS — Z6832 Body mass index (BMI) 32.0-32.9, adult: Secondary | ICD-10-CM | POA: Diagnosis not present

## 2020-03-29 DIAGNOSIS — K2980 Duodenitis without bleeding: Secondary | ICD-10-CM | POA: Insufficient documentation

## 2020-03-29 DIAGNOSIS — E669 Obesity, unspecified: Secondary | ICD-10-CM | POA: Diagnosis not present

## 2020-03-29 DIAGNOSIS — J449 Chronic obstructive pulmonary disease, unspecified: Secondary | ICD-10-CM | POA: Insufficient documentation

## 2020-03-29 DIAGNOSIS — K317 Polyp of stomach and duodenum: Secondary | ICD-10-CM | POA: Insufficient documentation

## 2020-03-29 DIAGNOSIS — K219 Gastro-esophageal reflux disease without esophagitis: Secondary | ICD-10-CM | POA: Insufficient documentation

## 2020-03-29 DIAGNOSIS — K449 Diaphragmatic hernia without obstruction or gangrene: Secondary | ICD-10-CM | POA: Diagnosis not present

## 2020-03-29 DIAGNOSIS — I10 Essential (primary) hypertension: Secondary | ICD-10-CM | POA: Insufficient documentation

## 2020-03-29 DIAGNOSIS — K3189 Other diseases of stomach and duodenum: Secondary | ICD-10-CM | POA: Diagnosis not present

## 2020-03-29 DIAGNOSIS — I2510 Atherosclerotic heart disease of native coronary artery without angina pectoris: Secondary | ICD-10-CM | POA: Diagnosis not present

## 2020-03-29 DIAGNOSIS — E118 Type 2 diabetes mellitus with unspecified complications: Secondary | ICD-10-CM | POA: Insufficient documentation

## 2020-03-29 DIAGNOSIS — Z87891 Personal history of nicotine dependence: Secondary | ICD-10-CM | POA: Diagnosis not present

## 2020-03-29 HISTORY — PX: ESOPHAGOGASTRODUODENOSCOPY (EGD) WITH PROPOFOL: SHX5813

## 2020-03-29 HISTORY — PX: BIOPSY: SHX5522

## 2020-03-29 LAB — GLUCOSE, CAPILLARY: Glucose-Capillary: 154 mg/dL — ABNORMAL HIGH (ref 70–99)

## 2020-03-29 SURGERY — ESOPHAGOGASTRODUODENOSCOPY (EGD) WITH PROPOFOL
Anesthesia: Monitor Anesthesia Care

## 2020-03-29 MED ORDER — SODIUM CHLORIDE 0.9 % IV SOLN: INTRAVENOUS | Status: DC

## 2020-03-29 MED ORDER — PROPOFOL 1000 MG/100ML IV EMUL
INTRAVENOUS | Status: AC
  Filled 2020-03-29: qty 100

## 2020-03-29 MED ORDER — PROPOFOL 10 MG/ML IV BOLUS
INTRAVENOUS | Status: DC | PRN
  Administered 2020-03-29: 30 mg via INTRAVENOUS
  Administered 2020-03-29 (×5): 20 mg via INTRAVENOUS
  Administered 2020-03-29: 30 mg via INTRAVENOUS

## 2020-03-29 MED ORDER — LACTATED RINGERS IV SOLN
INTRAVENOUS | Status: DC
  Administered 2020-03-29: 1000 mL via INTRAVENOUS

## 2020-03-29 MED ORDER — LIDOCAINE 2% (20 MG/ML) 5 ML SYRINGE
INTRAMUSCULAR | Status: DC | PRN
  Administered 2020-03-29: 60 mg via INTRAVENOUS

## 2020-03-29 SURGICAL SUPPLY — 15 items

## 2020-03-29 NOTE — Anesthesia Postprocedure Evaluation (Signed)
Anesthesia Post Note  Patient: [name] [name]  Procedure(s) Performed: ESOPHAGOGASTRODUODENOSCOPY (EGD) WITH PROPOFOL (N/A ) HOT HEMOSTASIS (ARGON PLASMA COAGULATION/BICAP) (N/A ) BIOPSY     Patient location during evaluation: PACU Anesthesia Type: MAC Level of consciousness: awake and alert Pain management: pain level controlled Vital Signs Assessment: post-procedure vital signs reviewed and stable Respiratory status: spontaneous breathing, nonlabored ventilation, respiratory function stable and patient connected to nasal cannula oxygen Cardiovascular status: stable and blood pressure returned to baseline Postop Assessment: no apparent nausea or vomiting Anesthetic complications: no    Last Vitals:  Vitals:   03/29/20 0846 03/29/20 0900  BP: (!) 127/54 123/60  Pulse: 81 73  Resp: 18 19  Temp: (!) 36.4 C   SpO2: 100% 95%    Last Pain:  Vitals:   03/29/20 0846  TempSrc: Temporal  PainSc: 0-No pain                 Effie Berkshire

## 2020-03-29 NOTE — Discharge Instructions (Signed)
YOU HAD AN ENDOSCOPIC PROCEDURE TODAY: Refer to the procedure report and other information in the discharge instructions given to you for any specific questions about what was found during the examination. If this information does not answer your questions, please call [facility] office at [phone] to clarify.   YOU SHOULD EXPECT: Some feelings of bloating in the abdomen. Passage of more gas than usual. Walking can help get rid of the air that was put into your GI tract during the procedure and reduce the bloating. If you had a lower endoscopy (such as a colonoscopy or flexible sigmoidoscopy) you may notice spotting of blood in your stool or on the toilet paper. Some abdominal soreness may be present for a day or two, also.  DIET: Your first meal following the procedure should be a light meal and then it is ok to progress to your normal diet. A half-sandwich or bowl of soup is an example of a good first meal. Heavy or fried foods are harder to digest and may make you feel nauseous or bloated. Drink plenty of fluids but you should avoid alcoholic beverages for 24 hours. If you had a esophageal dilation, please see attached instructions for diet.    ACTIVITY: Your care partner should take you home directly after the procedure. You should plan to take it easy, moving slowly for the rest of the day. You can resume normal activity the day after the procedure however YOU SHOULD NOT DRIVE, use power tools, machinery or perform tasks that involve climbing or major physical exertion for 24 hours (because of the sedation medicines used during the test).   SYMPTOMS TO REPORT IMMEDIATELY: A gastroenterologist can be reached at any hour. Please call [phone]  for any of the following symptoms:  . Following lower endoscopy (colonoscopy, flexible sigmoidoscopy) Excessive amounts of blood in the stool  Significant tenderness, worsening of abdominal pains  Swelling of the abdomen that is new, acute  Fever of 100  or higher  . Following upper endoscopy (EGD, EUS, ERCP, esophageal dilation) Vomiting of blood or coffee ground material  New, significant abdominal pain  New, significant chest pain or pain under the shoulder blades  Painful or persistently difficult swallowing  New shortness of breath  Black, tarry-looking or red, bloody stools  FOLLOW UP:  If any biopsies were taken you will be contacted by phone or by letter within the next 1-3 weeks. Call [phone]  if you have not heard about the biopsies in 3 weeks.  Please also call with any specific questions about appointments or follow up tests. Call if questions or problems otherwise call for biopsy report in 1 week and will decide when to repeat endoscopy and consider 1 more screening colonoscopy for your history of polyps and family history of colon cancer and follow-up as needed

## 2020-03-29 NOTE — Anesthesia Procedure Notes (Signed)
Date/Time: 03/29/2020 8:23 AM Performed by: Talbot Grumbling, CRNA Oxygen Delivery Method: Simple face mask

## 2020-03-29 NOTE — Progress Notes (Signed)
Terrika T Rappaport 8:19 AM  Subjective: Patient is asymptomatic from a GI standpoint and we reviewed her previous endoscopy and has no other complaints  Objective: Vital signs stable afebrile exam please see preassessment evaluation  Assessment: Duodenal polyp  Plan: Okay to proceed with EGD with anesthesia assistance with further work-up and plans pending those findings  [facility]  office [phone] After 5PM or if no answer call [phone]

## 2020-03-29 NOTE — Transfer of Care (Signed)
Immediate Anesthesia Transfer of Care Note  Patient: [name] [name]  Procedure(s) Performed: ESOPHAGOGASTRODUODENOSCOPY (EGD) WITH PROPOFOL (N/A ) HOT HEMOSTASIS (ARGON PLASMA COAGULATION/BICAP) (N/A ) BIOPSY  Patient Location: PACU  Anesthesia Type:MAC  Level of Consciousness: sedated  Airway & Oxygen Therapy: Patient Spontanous Breathing and Patient connected to face mask oxygen  Post-op Assessment: Report given to RN and Post -op Vital signs reviewed and stable  Post vital signs: Reviewed and stable  Last Vitals:  Vitals Value Taken Time  BP    Temp    Pulse    Resp    SpO2      Last Pain:  Vitals:   03/29/20 0729  TempSrc: Oral  PainSc: 5          Complications: No apparent anesthesia complications

## 2020-03-29 NOTE — Op Note (Signed)
[facility] Patient Name: [name] [name] Procedure Date: 03/29/2020 MRN: [id number] Attending MD: Clarene Essex , MD Date of Birth: 1941-01-04 CSN: [id number] Age: 79 Admit Type: Outpatient Procedure:                Upper GI endoscopy Indications:              Follow-up of polyps in the duodenum Providers:                Clarene Essex, MD, Benetta Spar RN, RN, Elspeth Cho Tech., Technician, Marla Roe, CRNA Referring MD:              Medicines:                Propofol total dose 160 mg IV, 60 mg IV lidocaine Complications:            No immediate complications. Estimated Blood Loss:     Estimated blood loss: none. Procedure:                Pre-Anesthesia Assessment:                           - Prior to the procedure, a History and Physical                            was performed, and patient medications and                            allergies were reviewed. The patient's tolerance of                            previous anesthesia was also reviewed. The risks                            and benefits of the procedure and the sedation                            options and risks were discussed with the patient.                            All questions were answered, and informed consent                            was obtained. Prior Anticoagulants: The patient has                            taken no previous anticoagulant or antiplatelet                            agents except for aspirin. ASA Grade Assessment: II                            - A patient with mild systemic disease. After  reviewing the risks and benefits, the patient was                            deemed in satisfactory condition to undergo the                            procedure.                           After obtaining informed consent, the endoscope was                            passed under direct vision. Throughout the      procedure, the patient's blood pressure, pulse, and                            oxygen saturations were monitored continuously. The                            GIF H-190 ([id number]) Olympus was introduced through                            the mouth, and advanced to the third part of                            duodenum. The upper GI endoscopy was accomplished                            without difficulty. The patient tolerated the                            procedure well. Scope In: Scope Out: Findings:      A small hiatal hernia was present.      A medium amount of food (residue) was found in the gastric fundus and in       the gastric body. Some of it was lavaged and suctioned      The duodenal bulb and third portion of the duodenum were normal.      A single small semi-sessile polyp with no bleeding was found in the       second portion of the duodenum. The previous place clip from previous       polypectomy bleeding was seen with some polypoid tissue around it and       biopsies were taken with a cold forceps for histology.      The exam was otherwise without abnormality. Impression:               - Small hiatal hernia.                           - A medium amount of food (residue) in the stomach.                           - Normal duodenal bulb and third portion of the  duodenum.                           - A single small residual duodenal polyp with                            previously placed clip present. Biopsied.                           - The examination was otherwise normal. Moderate Sedation:      Not Applicable - Patient had care per Anesthesia. Recommendation:           - Patient has a contact number available for                            emergencies. The signs and symptoms of potential                            delayed complications were discussed with the                            patient. Return to normal activities tomorrow.                             Written discharge instructions were provided to the                            patient.                           - Soft diet today.                           - Continue present medications.                           - Await pathology results. To determine timing of                            repeat endoscopy                           - Return to GI clinic PRN.                           - Telephone GI clinic for pathology results in 1                            week.                           - Telephone GI clinic if symptomatic PRN.                           - Perform a colonoscopy PRN. For her history of  polyps and family history of colon cancer if she                            would like 1 more Procedure Code(s):        --- Professional ---                           [phone], Esophagogastroduodenoscopy, flexible,                            transoral; with biopsy, single or multiple Diagnosis Code(s):        --- Professional ---                           K44.9, Diaphragmatic hernia without obstruction or                            gangrene                           K31.7, Polyp of stomach and duodenum CPT copyright 2019 [employer]. All rights reserved. The codes documented in this report are preliminary and upon coder review may  be revised to meet current compliance requirements. Clarene Essex, MD 03/29/2020 8:51:12 AM This report has been signed electronically. Number of Addenda: 0

## 2020-03-29 NOTE — Anesthesia Preprocedure Evaluation (Addendum)
Anesthesia Evaluation  Patient identified by MRN, date of birth, ID band Patient awake    Reviewed: Allergy & Precautions, NPO status , Patient's Chart, lab work & pertinent test results  Airway Mallampati: II  TM Distance: >3 FB Neck ROM: Full    Dental  (+) Teeth Intact, Dental Advisory Given   Pulmonary sleep apnea and Continuous Positive Airway Pressure Ventilation , COPD, former smoker,    Pulmonary exam normal        Cardiovascular hypertension, + CAD   Rhythm:Regular Rate:Normal     Neuro/Psych PSYCHIATRIC DISORDERS Depression    GI/Hepatic Neg liver ROS, GERD  ,  Endo/Other  diabetes, Type 2  Renal/GU      Musculoskeletal negative musculoskeletal ROS (+)   Abdominal (+) + obese,   Peds  Hematology negative hematology ROS (+)   Anesthesia Other Findings   Reproductive/Obstetrics                            Anesthesia Physical Anesthesia Plan  ASA: III  Anesthesia Plan: MAC   Post-op Pain Management:    Induction: Intravenous  PONV Risk Score and Plan: 0 and Propofol infusion  Airway Management Planned: Natural Airway and Nasal Cannula  Additional Equipment: None  Intra-op Plan:   Post-operative Plan:   Informed Consent: I have reviewed the patients History and Physical, chart, labs and discussed the procedure including the risks, benefits and alternatives for the proposed anesthesia with the patient or authorized representative who has indicated his/her understanding and acceptance.       Plan Discussed with: CRNA  Anesthesia Plan Comments:        Anesthesia Quick Evaluation

## 2020-03-30 ENCOUNTER — Encounter: Payer: Self-pay | Admitting: *Deleted

## 2020-03-30 LAB — SURGICAL PATHOLOGY

## 2020-04-27 DIAGNOSIS — R079 Chest pain, unspecified: Secondary | ICD-10-CM | POA: Diagnosis not present

## 2020-04-27 DIAGNOSIS — E1149 Type 2 diabetes mellitus with other diabetic neurological complication: Secondary | ICD-10-CM | POA: Diagnosis not present

## 2020-04-27 DIAGNOSIS — Z794 Long term (current) use of insulin: Secondary | ICD-10-CM | POA: Diagnosis not present

## 2020-04-27 DIAGNOSIS — I2510 Atherosclerotic heart disease of native coronary artery without angina pectoris: Secondary | ICD-10-CM | POA: Diagnosis not present

## 2020-04-27 DIAGNOSIS — E1129 Type 2 diabetes mellitus with other diabetic kidney complication: Secondary | ICD-10-CM | POA: Diagnosis not present

## 2020-04-27 DIAGNOSIS — E669 Obesity, unspecified: Secondary | ICD-10-CM | POA: Diagnosis not present

## 2020-04-27 DIAGNOSIS — R0609 Other forms of dyspnea: Secondary | ICD-10-CM | POA: Diagnosis not present

## 2020-04-27 DIAGNOSIS — J449 Chronic obstructive pulmonary disease, unspecified: Secondary | ICD-10-CM | POA: Diagnosis not present

## 2020-05-23 ENCOUNTER — Other Ambulatory Visit: Payer: Self-pay

## 2020-05-23 ENCOUNTER — Ambulatory Visit (INDEPENDENT_AMBULATORY_CARE_PROVIDER_SITE_OTHER): Payer: Medicare Other | Admitting: Cardiology

## 2020-05-23 ENCOUNTER — Encounter: Payer: Self-pay | Admitting: Cardiology

## 2020-05-23 VITALS — BP 148/68 | HR 65 | Ht 62.5 in | Wt 186.6 lb

## 2020-05-23 DIAGNOSIS — I10 Essential (primary) hypertension: Secondary | ICD-10-CM

## 2020-05-23 DIAGNOSIS — R0609 Other forms of dyspnea: Secondary | ICD-10-CM

## 2020-05-23 DIAGNOSIS — R079 Chest pain, unspecified: Secondary | ICD-10-CM | POA: Diagnosis not present

## 2020-05-23 DIAGNOSIS — I350 Nonrheumatic aortic (valve) stenosis: Secondary | ICD-10-CM

## 2020-05-23 DIAGNOSIS — E785 Hyperlipidemia, unspecified: Secondary | ICD-10-CM

## 2020-05-23 DIAGNOSIS — E1169 Type 2 diabetes mellitus with other specified complication: Secondary | ICD-10-CM | POA: Diagnosis not present

## 2020-05-23 DIAGNOSIS — R931 Abnormal findings on diagnostic imaging of heart and coronary circulation: Secondary | ICD-10-CM | POA: Diagnosis not present

## 2020-05-23 DIAGNOSIS — I2510 Atherosclerotic heart disease of native coronary artery without angina pectoris: Secondary | ICD-10-CM

## 2020-05-23 DIAGNOSIS — R072 Precordial pain: Secondary | ICD-10-CM

## 2020-05-23 DIAGNOSIS — R0600 Dyspnea, unspecified: Secondary | ICD-10-CM

## 2020-05-23 MED ORDER — METOPROLOL TARTRATE 50 MG PO TABS: 50.0000 mg | ORAL_TABLET | Freq: Once | ORAL | 0 refills | Status: DC

## 2020-05-23 NOTE — Progress Notes (Signed)
Primary Care Provider: Marton Redwood, MD, [facility], [place] Cardiologist: No primary care provider on file. Electrophysiologist: None  Clinic Note: Chief Complaint  Patient presents with  . Shortness of Breath    Worse with exertion  . Aortic Stenosis    Previously noted on echo    HPI:    [name] [name] "[name] [name]" is a 79 y.o. female with a PMH notable for OSA (on CPAP), COPD, questionable bicuspid aortic valve below who is being seen today for the evaluation of coronary artery calcification noted on CT scan at the request of Marton Redwood, MD.  [name] [name] was last seen on September 08, 2018 to follow-up results of her Myoview and echo.  Nonischemic Myoview.  Echo showed mild aortic stenosis with possible functional bicuspid aortic valve.  Mean gradient was only 15 mmHg.  Only GR 1 DD noted. ->  Plan was a follow-up echo in 2 years.  During her last visit with Dr. Annamaria Boots in March, he noted the following: She has baseline exertional dyspnea which seems to be more prominent with the Covid mask.  But no coughing or wheezing.  Not using inhaler.  Uses CPAP machine every day.  Sleeps well with occasional naps.  Started to get little more exercise now, no changes to baseline DOE.  Recent Hospitalizations:   EGD 03/29/2020-noted small hiatal hernia.  Medium amount of food residue in the gastric fundus.  Normal duodenal bulb.  Single small sessile polyp without bleeding in the second portion of duodenum.  Biopsied.  Clips are present.  Reviewed  CV studies:    The following studies were reviewed today: (if available, images/films reviewed: From Epic Chart or Care Everywhere) . None since July 2019-reviewed below):   Interval History:   [name] [name] being seen today for follow-up presumably because of coronary calcification but also because of aortic stenosis follow-up.  See has noted that she has been a lot less active than usual because of the COVID-19  restrictions.  She has been scared to go out and do anything because of her COPD.  She also notes that she has pretty unstable gait, and therefore is scared to do a lot of walking because of fear of falling. Despite this, she has noted that her exertional dyspnea seems to be a lot worse with just routine walking around.  She occasionally notes pressure in her chest associated with dyspnea that does not always occur with exertion, but if she does note significant dyspnea, she will sometimes note the pressure.  She does not have chest pressure at rest, her baseline dyspnea is no real change from before.   She sleeps on a couple pillows more because of comfort and GERD than because of orthopnea.  No real PND.  She has stable mild edema.  She denies any claudication but does note that she has a lot of pain in the balls of her feet most notably in the right foot.  CV Review of Symptoms (Summary) Cardiovascular ROS: positive for - chest pain, dyspnea on exertion, edema and Decreased exercise tolerance negative for - irregular heartbeat, loss of consciousness, orthopnea, palpitations, paroxysmal nocturnal dyspnea, rapid heart rate, shortness of breath or Near syncope, TIA/amaurosis fugax and claudication  The patient does not have symptoms concerning for COVID-19 infection (fever, chills, cough, or new shortness of breath).  Symptoms are pretty much stable at rest The patient is practicing social distancing & Masking.  Is frustrated with precautions, but coping.  She  has had both of her Moderna COVID-19 vaccine injections following her Covid infection.  REVIEWED OF SYSTEMS   ROS Constitutional: Positive for malaise/fatigue (all the time; both motivating & with activity -since retirement). Negative for chills and fever.  HENT: Positive for congestion (drainiage). Negative for nosebleeds.   Respiratory: Positive for cough (morning cough) and shortness of breath (exertional -but seems to be improving).  Negative for sputum production and wheezing.   Cardiovascular: Positive for orthopnea and PND (maybe - but hard to tell with OSA).  Gastrointestinal: Negative for abdominal pain, blood in stool and melena.  Genitourinary: Negative for hematuria.  Musculoskeletal: Negative for falls (almost fall - (has a fear of falling) ).  Skin: Negative for itching (les - sees a dermatologist).  Neurological: Negative for weakness.  Endo/Heme/Allergies: Positive for environmental allergies.  I have reviewed and (if needed) personally updated the patient's problem list, medications, allergies, past medical and surgical history, social and family history.   PAST MEDICAL HISTORY   Past Medical History:  Diagnosis Date  . Aortic valve stenosis, mild    per echo 02-13-2016  possible bicuspid w/ moderate thickened and calicified ,  valve area 1.34cm^2    pt denies  . COPD (chronic obstructive pulmonary disease) ([place])   . GERD (gastroesophageal reflux disease)    no problems since started on CPAP  . Hematuria   . History of adenomatous polyp of colon    tubular adnenoma's and hyperplastic polyp's 05-19-2002;  08-15-2010;  2015  . History of kidney stones    multiple since 1970's  . History of squamous cell carcinoma excision    05-14-2012  right arm;  04-18-2015  left ankle  . HTN (hypertension)   . Hyperlipidemia   . Iron deficiency anemia   . Left ureteral stone   . OSA on CPAP    per study 10-13-2006 mild to moderate osa w/ hypersomnia  . Seasonal and perennial allergic rhinitis   . Type 2 diabetes mellitus (HCC)    type 2  . Urgency of urination    Followed by Dr. Annamaria Boots for COPD, advanced OSA on CPAP-8 (using spouses old machine from the [place] due to insurance issues).  PAST SURGICAL HISTORY   Past Surgical History:  Procedure Laterality Date  . BIOPSY  03/29/2020   Procedure: BIOPSY;  Surgeon: Clarene Essex, MD;  Location: [facility];  Service: Endoscopy;;  . CARPAL TUNNEL RELEASE Bilateral  2003  . COLONOSCOPY  last one 2015  . CT CHEST WITH CONTRAST   ([place] HX)     Advanced aortic and branch vessel atherosclerosis. Tortuous thoracic aorta. Normal heart size, without pericardial effusion. Multivessel coronary artery atherosclerosis  . CYSTOSCOPY WITH RETROGRADE PYELOGRAM, URETEROSCOPY AND STENT PLACEMENT Left 04/15/2017   Procedure: CYSTOSCOPY WITH LEFT  RETROGRADE PYELOGRAM, URETEROSCOPYwith stone basketry;  Surgeon: Kathie Rhodes, MD;  Location: [place];  Service: Urology;  Laterality: Left;  . D & C HYSTEROSCOPY W/ RESECTION FIBROID AND POLYP  07/30/2000  . DOBUTAMINE STRESS ECHO  01-30-2008   [facility]   no ischemia/  normal wall motion/  post stress ef 79%  . ESOPHAGOGASTRODUODENOSCOPY (EGD) WITH PROPOFOL N/A 01/28/2018   Procedure: ESOPHAGOGASTRODUODENOSCOPY (EGD) WITH PROPOFOL;  Surgeon: Clarene Essex, MD;  [facility]: [facility];  Service: Endoscopy;  Laterality: N/A;  . ESOPHAGOGASTRODUODENOSCOPY (EGD) WITH PROPOFOL N/A 08/19/2019   Procedure: ESOPHAGOGASTRODUODENOSCOPY (EGD) WITH PROPOFOL;  Surgeon: Clarene Essex, MD;  [facility];  Service: Endoscopy;  Laterality: N/A;  . ESOPHAGOGASTRODUODENOSCOPY (EGD) WITH PROPOFOL  N/A 03/29/2020   Procedure: ESOPHAGOGASTRODUODENOSCOPY (EGD) WITH PROPOFOL;  Surgeon: Clarene Essex, MD;  [facility];  Service: Endoscopy;  Laterality: N/A;  . [place]   trauma   . HEMOSTASIS CLIP PLACEMENT  08/19/2019   Procedure: HEMOSTASIS CLIP PLACEMENT;  Surgeon: Clarene Essex, MD;  [facility]: [facility];  Service: Endoscopy;;  . HOT HEMOSTASIS N/A 01/28/2018   Procedure: HOT HEMOSTASIS (ARGON PLASMA COAGULATION/BICAP);  Surgeon: Clarene Essex, MD;  Location: [facility];  Service: Endoscopy;  Laterality: N/A;  . LAPAROSCOPIC CHOLECYSTECTOMY  08/20/2001   w/ ERCP spincterotomy w/ balloon  . NM MYOVIEW LTD  06/2018   EF > 65%. No Ischemia or Infarction. Normal Study  . POLYPECTOMY  08/19/2019   Procedure:  POLYPECTOMY;  Surgeon: Clarene Essex, MD;  Location: [facility];  Service: Endoscopy;;  . TRANSTHORACIC ECHOCARDIOGRAM  02/13/2016   LVEF 55-60%. Gr 1 DD. ? possible bicuspid AoV w/ moderate thickened / calcified AoV w/ mild Stenosis (Mean Gradietn 15 mmHg, Valve area 1.34cm^2), no regurg.    . TRANSTHORACIC ECHOCARDIOGRAM  06/2018   EF 55-65%. Gr 1 DD. Mild AS (? Functionally bicuspid AoV with moderate leaflet thickening) - Mean Gradient 15 mmHg, Peak 32 mmHg  . TUBAL LIGATION Bilateral yrs ago   Studies from 2019 reviewed.  MEDICATIONS/ALLERGIES   Current Meds  Medication Sig  . albuterol (VENTOLIN HFA) 108 (90 Base) MCG/ACT inhaler Inhale 2 puffs into the lungs every 6 (six) hours as needed for wheezing or shortness of breath.  Marland Kitchen amLODipine (NORVASC) 10 MG tablet Take 10 mg by mouth daily.   Marland Kitchen aspirin EC 81 MG tablet Take 81 mg by mouth at bedtime.  . Carboxymethylcellul-Glycerin (LUBRICATING EYE DROPS OP) Place 1 drop into both eyes 3 (three) times daily as needed (dry eyes).   . Cholecalciferol (VITAMIN D-3) 125 MCG (5000 UT) TABS Take 5,000 Units by mouth See admin instructions. Take 1 tablet (5000 units) by mouth every evening, except Fridays.  Marland Kitchen escitalopram (LEXAPRO) 10 MG tablet Take 10 mg by mouth every morning.   Marland Kitchen icosapent Ethyl (VASCEPA) 1 g capsule Take 2 capsules by mouth 2 (two) times daily.   . insulin aspart (NOVOLOG FLEXPEN) 100 UNIT/ML FlexPen Inject 8 Units into the skin 3 (three) times daily with meals.  . insulin detemir (LEVEMIR) 100 UNIT/ML injection Inject 30 Units into the skin daily.   Marland Kitchen losartan (COZAAR) 50 MG tablet Take 50 mg by mouth daily.  Marland Kitchen MELATONIN PO Take 1 tablet by mouth at bedtime as needed (sleep).  . simvastatin (ZOCOR) 20 MG tablet Take 20 mg by mouth at bedtime.   . TRULICITY 1.5 FK/8.1EX SOPN Inject 1.5 mg into the skin every Wednesday.   . Vitamin D, Ergocalciferol, (DRISDOL) 50000 units CAPS capsule Take 50,000 Units by mouth every Friday.       Allergies  Allergen Reactions  . Metformin And Related     Caused kidney issues    SOCIAL HISTORY/FAMILY HISTORY   Reviewed in Epic:  Social History   Tobacco Use  . Smoking status: Former Smoker    Packs/day: 0.25    Years: 50.00    Pack years: 12.50    Types: Cigarettes    Quit date: 12/01/2015    Years since quitting: 4.4  . Smokeless tobacco: Never Used  Substance Use Topics  . Alcohol use: Yes    Alcohol/week: 0.0 standard drinks    Comment: rare  . 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   Caffeine: 1 drink/day exercise routine to because of back pain.   Family History  Problem Relation Age of Onset  . Alzheimer's disease Mother   . Colon cancer Paternal Grandfather   . Heart attack Paternal Grandfather   . Brain cancer Paternal Grandmother   . Heart disease Paternal Grandmother        not sure of details  . Heart disease Maternal Grandmother        pacemaker, heart attack  . Alzheimer's disease Maternal Grandmother   . Colon cancer Paternal Uncle   . Colon cancer Paternal Uncle   . Colon cancer Paternal Aunt   . Heart attack Maternal Uncle   . Lung cancer Maternal Uncle   . Hyperlipidemia Brother   . Hypertension Brother     OBJCTIVE -PE, EKG, labs   Wt Readings from Last 3 Encounters:  05/23/20 186 lb 9.6 oz (84.6 kg)  03/29/20 183 lb (83 kg)  03/16/20 184 lb (83.5 kg)    Physical Exam: BP (!) 148/68   Pulse 65   Ht 5' 2.5" (1.588 m)   Wt 186 lb 9.6 oz (84.6 kg)   BMI 33.59 kg/m  Physical Exam  Constitutional: She is oriented to person, place, and time. She appears well-developed and well-nourished. No distress.  Mildly obese.  Healthy-appearing.  Well-groomed.  HENT:  Head: Normocephalic and  atraumatic.  Neck: No hepatojugular reflux and no JVD present. Carotid bruit is not present.  Cardiovascular: Normal rate, regular rhythm, S1 normal, S2 normal and intact distal pulses.  Occasional extrasystoles are present. PMI is not displaced (Difficult to palpate). Exam reveals distant heart sounds. Exam reveals no gallop and no friction rub.  Murmur heard.  Medium-pitched harsh crescendo-decrescendo midsystolic murmur is present with a grade of 2/6 at the upper right sternal border radiating to the neck. Pulmonary/Chest: Effort normal. No respiratory distress. She has no wheezes. She has no rales.  Distant breath sounds with increased AP diameter  Abdominal: Soft. Bowel sounds are normal. She exhibits no distension. There is no abdominal tenderness. There is no rebound.  Musculoskeletal:        General: Edema (Trivial bilateral LE) present. Normal range of motion.     Cervical back: Normal range of motion and neck supple.  Neurological: She is alert and oriented to person, place, and time.  Psychiatric: She has a normal mood and affect. Her behavior is normal. Judgment and thought content normal.  Vitals reviewed.    Adult ECG Report From PCP 04/27/2020 rate: 71;  Rhythm: normal sinus rhythm and Normal axis, intervals and durations.;   Narrative Interpretation: Normal EKG.  Rate: 65 ;  Rhythm: normal sinus rhythm, premature atrial contractions (PAC) and Otherwise normal axis, intervals and durations.;   Narrative Interpretation: Relative normal EKG.  Recent Labs: January 07, 2020: TC 166, TG 275, HDL 32.  (LDL from 01/07/2019 109)  April 27, 2020: A1c 8.8.  Hemoglobin 12.8.  CR 2.0, K+ 4.4. No results found for: CHOL, HDL, LDLCALC, LDLDIRECT, TRIG, CHOLHDL Lab Results  Component Value Date   CREATININE 1.30 (H) 04/15/2017   BUN 14 04/15/2017   NA 143 04/15/2017   K 4.4 04/15/2017   CL 109  04/15/2017   CO2 23 01/17/2017   No results found for: TSH.Marland Kitchen  Chest x-ray 04/27/2020: No  active heart disease.  Hyperinflation consistent with COPD.  ASSESSMENT/PLAN    Problem List Items Addressed This Visit    Hyperlipidemia associated with type 2 diabetes mellitus ([place]) (Chronic)    Unfortunately, I do not have the full report from her January labs.  LDL is 109 back in 2020, but I will have the LDL from 2021.  Triglycerides are elevated likely related to obesity and diabetes.  She is only on 20 mg of simvastatin.  Depending on coronary CTA/coronary calcium score results, may need to consider converting to atorvastatin or rosuvastatin in conjunction with Vascepa.  With elevated triglycerides, may need to consider increasing dose of Vascepa      Essential hypertension (Chronic)    Her blood pressure is little high today, but she is somewhat anxious.  Other clinic visits have shown relatively normal blood pressures.  For now continue amlodipine and losartan.  We may need to consider titrating losartan further. Had discussed possibly starting beta-blocker, but with COPD would prefer to avoid for now unless there is evidence of significant CAD.  In that case would probably use bisoprolol.      Mild aortic stenosis by prior echocardiogram (Chronic)    Previously noted back in 2019 with plans to follow-up in 2 years.  Now with her having exertional dyspnea symptoms that seem to be worsening, will check 2D echo to assess EF and wall motion abnormality as well as aortic valve.      Relevant Orders   ECHOCARDIOGRAM COMPLETE   Dyspnea (Chronic)    As previously evaluated with echocardiogram/Myoview, now symptoms seem to be worsening.  She is due for follow-up echocardiogram to reassess her valve which can also then be used to assess EF.  As such, I do not need an anatomical evaluation of her heart, but a coronary anatomy evaluation with coronary CT angiogram would be more appropriate.  This would allow for CT FFR ischemic evaluation as well.  Plan: 2D echocardiogram and coronary  CT angiogram/CT FFR.      Relevant Orders   EKG 12-Lead (Completed)   Basic metabolic panel   ECHOCARDIOGRAM COMPLETE   Coronary artery calcification seen on computed tomography - Primary (Chronic)    Previously has had a negative Myoview in the past.  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  With her worsening exertional dyspnea and chest tightness, I think an ischemic evaluation is warranted.  Plan: Convert ischemic evaluation to coronary CT angiogram with possible CT FFR.  This will also allow [place] to quantify her coronary calcium score. On amlodipine and statin as well as aspirin and losartan.  Not on beta-blocker because of COPD. Is also on Vascepa.      Relevant Orders   Basic metabolic panel   ECHOCARDIOGRAM COMPLETE   Precordial pain    Chest pain is at least moderate risk for cardiac etiology.  Negative Myoview in 2019, but now with worsening symptoms, would like to reevaluate.  I think a more appropriate study would be a coronary CTA given the above the coronary calcification noted on CT scan.  We can quantify with coronary calcium score and assess for any stenotic lesions with coronary CTA and CT FFR.      Relevant Orders   EKG 12-Lead (Completed)   CT CORONARY MORPH W/CTA COR W/SCORE W/CA W/CM &/OR WO/CM   CT CORONARY FRACTIONAL  FLOW RESERVE DATA PREP   CT CORONARY FRACTIONAL FLOW RESERVE FLUID ANALYSIS   Basic metabolic panel   ECHOCARDIOGRAM COMPLETE    Other Visit Diagnoses    Abnormal findings on diagnostic imaging of heart and coronary circulation        Relevant Orders   CT CORONARY FRACTIONAL FLOW RESERVE DATA PREP       COVID-19 Education: The signs and symptoms of COVID-19 were discussed with the patient and how to seek care for testing (follow up with PCP or arrange E-visit).   The importance of social distancing and COVID-19 vaccination was discussed today.  I spent a total of 24mnutes with the patient. >  50% of the  time was spent in direct patient consultation.  Additional time spent with chart review  / charting (studies, outside notes, etc): 10 Total Time: 32 min   Current medicines are reviewed at length with the patient today.  (+/- concerns) none  Notice: This dictation was prepared with Dragon dictation along with smaller phrase technology. Any transcriptional errors that result from this process are unintentional and may not be corrected upon review.  Patient Instructions / Medication Changes & Studies & Tests Ordered   Patient Instructions  Medication Instructions:   SEE INSTRUCTION  ON CCTA *If you need a refill on your cardiac medications before your next appointment, please call your pharmacy*   Lab Work: SEE INSTRUCTIONS ON CCTA If you have labs (blood work) drawn today and your tests are completely normal, you will receive your results only by: .Marland KitchenMyChart Message (if you have MyChart) OR . A paper copy in the mail If you have any lab test that is abnormal or we need to change your treatment, we will call you to review the results.   Testing/Procedures:  WILL BE SCHEDULE AT 1AbercrombieYour physician has requested that you have an echocardiogram. Echocardiography is a painless test that uses sound waves to create images of your heart. It provides your doctor with information about the size and shape of your heart and how well your heart's chambers and valves are working. This procedure takes approximately one hour. There are no restrictions for this procedure.    AND ONCE AUTHORIZATION IS OBTAINED  -CCTA WILL BE SCHEDULE AT CStrattonYour physician has requested that you have cardiac CT. Cardiac computed tomography (CT) is a painless test that uses an x-ray machine to take clear, detailed pictures of your heart. For further information please visit [email or web address] Please follow instruction sheet as given.    Follow-Up: [facility] and your health needs are our priority.  As part of our continuing mission to provide you with exceptional heart care, we have created designated Provider Care Teams.  These Care Teams include your primary Cardiologist (physician) and Advanced Practice Providers (APPs -  Physician Assistants and Nurse Practitioners) who all work together to provide you with the care you need, when you need it.   Your next appointment:   2 month(s)  The format for your next appointment:   In Person  Provider:   DGlenetta Hew MD   Other Instructions   Your cardiac CT will be scheduled at  the below location:   [facility] [phone]   [facility] minutes prior to test start time. Proceed  to the [facility] Radiology Department (first floor) to check-in and test prep.  If scheduled at [facility], please arrive 15 mins early for check-in and test prep.  Please follow these instructions carefully (unless otherwise directed):   On the Night Before the Test: . Be sure to Drink plenty of water. . Do not consume any caffeinated/decaffeinated beverages or chocolate 12 hours prior to your test. . Do not take any antihistamines 12 hours prior to your test.   On the Day of the Test: . Drink plenty of water. Do not drink any water within one hour of the test. . Do not eat any food 4 hours prior to the test. . You may take your regular medications prior to the test.  . Take metoprolol (Lopressor) 50 MG  two hours prior to test. . FEMALES- please wear underwire-free bra if available         After the Test: . Drink plenty of water. . After receiving IV contrast, you may experience a mild flushed feeling. This is normal. . On occasion, you may experience a mild rash up to 24 hours after the test. This is  not dangerous. If this occurs, you can take Benadryl 25 mg and increase your fluid intake. . If you experience trouble breathing, this can be serious. If it is severe call 911 IMMEDIATELY. If it is mild, please call our office. . If you take any of these medications: Glipizide/Metformin, Avandament, Glucavance, please do not take 48 hours after completing test unless otherwise instructed.   Once we have confirmed authorization from your insurance company, we will call you to set up a date and time for your test.   For non-scheduling related questions, please contact the cardiac imaging nurse navigator should you have any questions/concerns: Marchia Bond, Cardiac Imaging Nurse Navigator Burley Saver, Interim Cardiac Imaging Nurse [place] [facility] Direct Office Dial: [phone]   For scheduling needs, including cancellations and rescheduling, please call [phone].       Studies Ordered:   Orders Placed This Encounter  Procedures  . CT CORONARY MORPH W/CTA COR W/SCORE W/CA W/CM &/OR WO/CM  . CT CORONARY FRACTIONAL FLOW RESERVE DATA PREP  . CT CORONARY FRACTIONAL FLOW RESERVE FLUID ANALYSIS  . Basic metabolic panel  . EKG 12-Lead  . ECHOCARDIOGRAM COMPLETE     Glenetta Hew, M.D., M.S. Interventional Cardiologist   Pager # [phone] Phone # [phone] [address], [place] [address]   Thank you for choosing Heart[facility] at [facility]!!

## 2020-05-23 NOTE — Patient Instructions (Addendum)
Medication Instructions:   SEE INSTRUCTION  ON CCTA *If you need a refill on your cardiac medications before your next appointment, please call your pharmacy*   Lab Work: SEE INSTRUCTIONS ON CCTA If you have labs (blood work) drawn today and your tests are completely normal, you will receive your results only by: Marland Kitchen MyChart Message (if you have MyChart) OR . A paper copy in the mail If you have any lab test that is abnormal or we need to change your treatment, we will call you to review the results.   Testing/Procedures:  WILL BE SCHEDULE AT [place] Your physician has requested that you have an echocardiogram. Echocardiography is a painless test that uses sound waves to create images of your heart. It provides your doctor with information about the size and shape of your heart and how well your heart's chambers and valves are working. This procedure takes approximately one hour. There are no restrictions for this procedure.    AND ONCE AUTHORIZATION IS OBTAINED  -CCTA WILL BE SCHEDULE AT [place] Your physician has requested that you have cardiac CT. Cardiac computed tomography (CT) is a painless test that uses an x-ray machine to take clear, detailed pictures of your heart. For further information please visit [email or web address]. Please follow instruction sheet as given.    Follow-Up: At [facility], you and your health needs are our priority.  As part of our continuing mission to provide you with exceptional heart care, we have created designated Provider Care Teams.  These Care Teams include your primary Cardiologist (physician) and Advanced Practice Providers (APPs -  Physician Assistants and Nurse Practitioners) who all work together to provide you with the care you need, when you need it.   Your next appointment:   2 month(s)  The format for your next appointment:   In Person  Provider:   Glenetta Hew, MD   Other  Instructions   Your cardiac CT will be scheduled at  the below location:   [facility] [address] [place], [place] [address] [phone]    If scheduled at [facility], please arrive at the [facility] main entrance of [facility] 30 minutes prior to test start time. Proceed to the [facility] Radiology Department (first floor) to check-in and test prep.  If scheduled at [facility], please arrive 15 mins early for check-in and test prep.  Please follow these instructions carefully (unless otherwise directed):   On the Night Before the Test: . Be sure to Drink plenty of water. . Do not consume any caffeinated/decaffeinated beverages or chocolate 12 hours prior to your test. . Do not take any antihistamines 12 hours prior to your test.   On the Day of the Test: . Drink plenty of water. Do not drink any water within one hour of the test. . Do not eat any food 4 hours prior to the test. . You may take your regular medications prior to the test.  . Take metoprolol (Lopressor) 50 MG  two hours prior to test. . FEMALES- please wear underwire-free bra if available         After the Test: . Drink plenty of water. . After receiving IV contrast, you may experience a mild flushed feeling. This is normal. . On occasion, you may experience a mild rash up to 24 hours after the test. This is not dangerous. If this occurs, you can take Benadryl 25  mg and increase your fluid intake. . If you experience trouble breathing, this can be serious. If it is severe call 911 IMMEDIATELY. If it is mild, please call our office. . If you take any of these medications: Glipizide/Metformin, Avandament, Glucavance, please do not take 48 hours after completing test unless otherwise instructed.   Once we have confirmed authorization from your insurance company, we will call you to set up a date and time for your test.   For non-scheduling  related questions, please contact the cardiac imaging nurse navigator should you have any questions/concerns: Marchia Bond, Cardiac Imaging Nurse Navigator Burley Saver, Interim Cardiac Imaging Nurse [place] and V[facility] Services Direct Office Dial: [phone]   For scheduling needs, including cancellations and rescheduling, please call [phone].

## 2020-05-26 ENCOUNTER — Encounter: Payer: Self-pay | Admitting: Cardiology

## 2020-05-26 NOTE — Assessment & Plan Note (Signed)
Her blood pressure is little high today, but she is somewhat anxious.  Other clinic visits have shown relatively normal blood pressures.  For now continue amlodipine and losartan.  We may need to consider titrating losartan further. Had discussed possibly starting beta-blocker, but with COPD would prefer to avoid for now unless there is evidence of significant CAD.  In that case would probably use bisoprolol.

## 2020-05-26 NOTE — Assessment & Plan Note (Signed)
Unfortunately, I do not have the full report from her January labs.  LDL is 109 back in 2020, but I will have the LDL from 2021.  Triglycerides are elevated likely related to obesity and diabetes.  She is only on 20 mg of simvastatin.  Depending on coronary CTA/coronary calcium score results, may need to consider converting to atorvastatin or rosuvastatin in conjunction with Vascepa.  With elevated triglycerides, may need to consider increasing dose of Vascepa

## 2020-05-26 NOTE — Assessment & Plan Note (Signed)
Previously has had a negative Myoview in the past.  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  With her worsening exertional dyspnea and chest tightness, I think an ischemic evaluation is warranted.  Plan: Convert ischemic evaluation to coronary CT angiogram with possible CT FFR.  This will also allow [place] to quantify her coronary calcium score. On amlodipine and statin as well as aspirin and losartan.  Not on beta-blocker because of COPD. Is also on Vascepa.

## 2020-05-26 NOTE — Assessment & Plan Note (Signed)
Previously noted back in 2019 with plans to follow-up in 2 years.  Now with her having exertional dyspnea symptoms that seem to be worsening, will check 2D echo to assess EF and wall motion abnormality as well as aortic valve.

## 2020-05-26 NOTE — Assessment & Plan Note (Signed)
As previously evaluated with echocardiogram/Myoview, now symptoms seem to be worsening.  She is due for follow-up echocardiogram to reassess her valve which can also then be used to assess EF.  As such, I do not need an anatomical evaluation of her heart, but a coronary anatomy evaluation with coronary CT angiogram would be more appropriate.  This would allow for CT FFR ischemic evaluation as well.  Plan: 2D echocardiogram and coronary CT angiogram/CT FFR.

## 2020-05-26 NOTE — Assessment & Plan Note (Signed)
Chest pain is at least moderate risk for cardiac etiology.  Negative Myoview in 2019, but now with worsening symptoms, would like to reevaluate.  I think a more appropriate study would be a coronary CTA given the above the coronary calcification noted on CT scan.  We can quantify with coronary calcium score and assess for any stenotic lesions with coronary CTA and CT FFR.

## 2020-05-31 HISTORY — PX: TRANSTHORACIC ECHOCARDIOGRAM: SHX275

## 2020-06-03 DIAGNOSIS — E7849 Other hyperlipidemia: Secondary | ICD-10-CM | POA: Diagnosis not present

## 2020-06-06 DIAGNOSIS — R0600 Dyspnea, unspecified: Secondary | ICD-10-CM | POA: Diagnosis not present

## 2020-06-06 DIAGNOSIS — R079 Chest pain, unspecified: Secondary | ICD-10-CM | POA: Diagnosis not present

## 2020-06-06 DIAGNOSIS — R072 Precordial pain: Secondary | ICD-10-CM | POA: Diagnosis not present

## 2020-06-06 DIAGNOSIS — I2510 Atherosclerotic heart disease of native coronary artery without angina pectoris: Secondary | ICD-10-CM | POA: Diagnosis not present

## 2020-06-07 ENCOUNTER — Other Ambulatory Visit: Payer: Self-pay | Admitting: Internal Medicine

## 2020-06-07 ENCOUNTER — Telehealth: Payer: Self-pay | Admitting: *Deleted

## 2020-06-07 DIAGNOSIS — R0609 Other forms of dyspnea: Secondary | ICD-10-CM

## 2020-06-07 DIAGNOSIS — I2510 Atherosclerotic heart disease of native coronary artery without angina pectoris: Secondary | ICD-10-CM

## 2020-06-07 DIAGNOSIS — R7401 Elevation of levels of liver transaminase levels: Secondary | ICD-10-CM

## 2020-06-07 DIAGNOSIS — R079 Chest pain, unspecified: Secondary | ICD-10-CM

## 2020-06-07 DIAGNOSIS — R0600 Dyspnea, unspecified: Secondary | ICD-10-CM

## 2020-06-07 LAB — BASIC METABOLIC PANEL
BUN/Creatinine Ratio: 13 (ref 12–28)
BUN: 27 mg/dL (ref 8–27)
CO2: 23 mmol/L (ref 20–29)
Calcium: 10 mg/dL (ref 8.7–10.3)
Chloride: 104 mmol/L (ref 96–106)
Creatinine, Ser: 2.06 mg/dL — ABNORMAL HIGH (ref 0.57–1.00)
GFR calc Af Amer: 26 mL/min/{1.73_m2} — ABNORMAL LOW (ref >59)
GFR calc non Af Amer: 22 mL/min/{1.73_m2} — ABNORMAL LOW (ref >59)
Glucose: 146 mg/dL — ABNORMAL HIGH (ref 65–99)
Potassium: 4.9 mmol/L (ref 3.5–5.2)
Sodium: 142 mmol/L (ref 134–144)

## 2020-06-07 NOTE — Telephone Encounter (Signed)
t he converstaion below occurred on 06/07/20    From radiology -Kerin Ransom  RN Hi this pt is scheduled for a coronary CT on thursday. We got her labs back yesterday and her creat is 2.06 and GFR is 22. Her kidney function are too low for [place] to safely perform the scan even with IV hydration at this point. Please advise.    From Jackelyn Hoehn , Dr Ellyn Hack is out of the office for couple of weeks . I will see if i can get intouch with him tosee what his recomedation aer ?  From [place] RN ok thank you for the response. Romie Minus will call to cancel for now   From Dr Ellyn Hack so, It looks like her Cr was 2.0 back in Jan. - Not ideal b/c I do think that Cor CTA is better option for her. guess we should change to Myoview. Other option would be to give IVF hydration first - not very do-able.

## 2020-06-08 DIAGNOSIS — N184 Chronic kidney disease, stage 4 (severe): Secondary | ICD-10-CM | POA: Diagnosis not present

## 2020-06-08 DIAGNOSIS — E1129 Type 2 diabetes mellitus with other diabetic kidney complication: Secondary | ICD-10-CM | POA: Diagnosis not present

## 2020-06-08 DIAGNOSIS — I129 Hypertensive chronic kidney disease with stage 1 through stage 4 chronic kidney disease, or unspecified chronic kidney disease: Secondary | ICD-10-CM | POA: Diagnosis not present

## 2020-06-08 DIAGNOSIS — I10 Essential (primary) hypertension: Secondary | ICD-10-CM | POA: Diagnosis not present

## 2020-06-08 DIAGNOSIS — Z794 Long term (current) use of insulin: Secondary | ICD-10-CM | POA: Diagnosis not present

## 2020-06-08 DIAGNOSIS — I2510 Atherosclerotic heart disease of native coronary artery without angina pectoris: Secondary | ICD-10-CM | POA: Diagnosis not present

## 2020-06-08 NOTE — Telephone Encounter (Signed)
The patient has been notified of the result and verbalized understanding.  All questions (if any) were answered. Patient is agreement for lexiscan .  She states she has not been hydrating she states she does not like water but she will try and drink more.  Patient states she is not available the week of the 6 th of June . Raiford Simmonds, RN 06/08/2020 2:58 PM

## 2020-06-09 ENCOUNTER — Ambulatory Visit (HOSPITAL_COMMUNITY): Admission: RE | Admit: 2020-06-09 | Payer: Medicare Other | Source: Ambulatory Visit

## 2020-06-10 ENCOUNTER — Ambulatory Visit
Admission: RE | Admit: 2020-06-10 | Discharge: 2020-06-10 | Disposition: A | Discharge status: Home / Self Care | Payer: Medicare Other | Source: Ambulatory Visit | Attending: Internal Medicine | Admitting: Internal Medicine

## 2020-06-10 DIAGNOSIS — K760 Fatty (change of) liver, not elsewhere classified: Secondary | ICD-10-CM | POA: Diagnosis not present

## 2020-06-10 DIAGNOSIS — R7401 Elevation of levels of liver transaminase levels: Secondary | ICD-10-CM

## 2020-06-10 IMAGING — US US ABDOMEN LIMITED
1 series · 14 of 25 positions shown · non-contrast
Comparison: None.

CLINICAL DATA: Elevated transaminase. No abdominal pain. Previous
cholecystectomy.

EXAM:
ULTRASOUND ABDOMEN LIMITED RIGHT UPPER QUADRANT

[Series 1: us abdomen limited · 0.26mm/px · 14 of 39 slices shown]
[im 1/39]
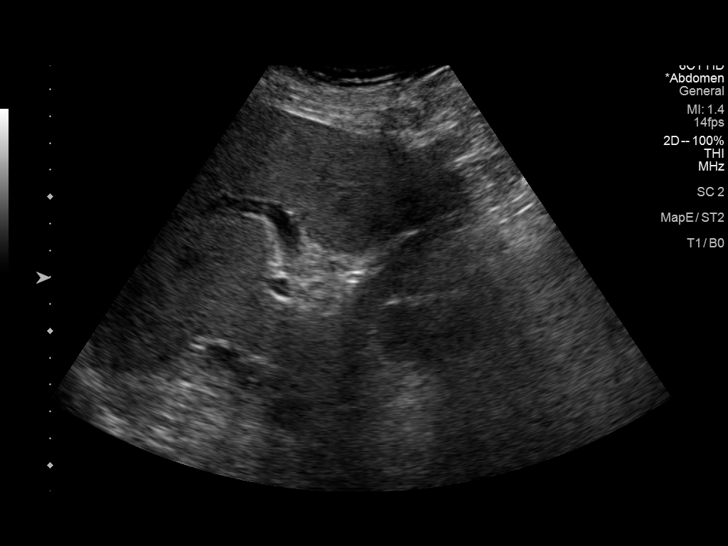
[im 4/39]
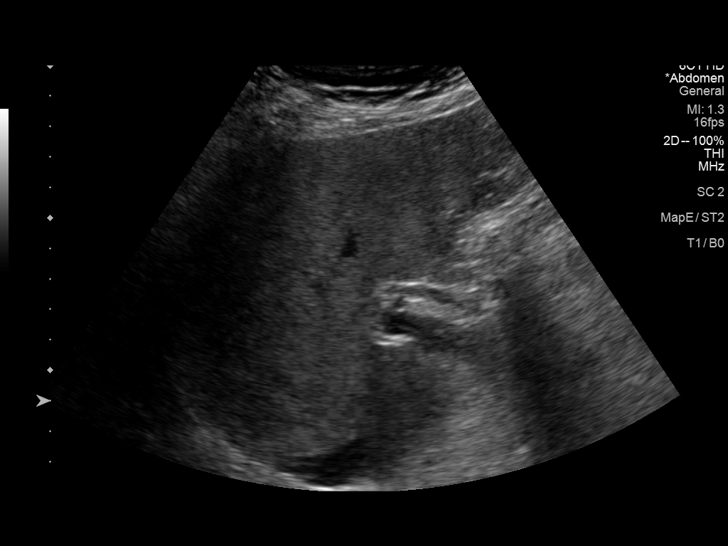
[im 7/39]
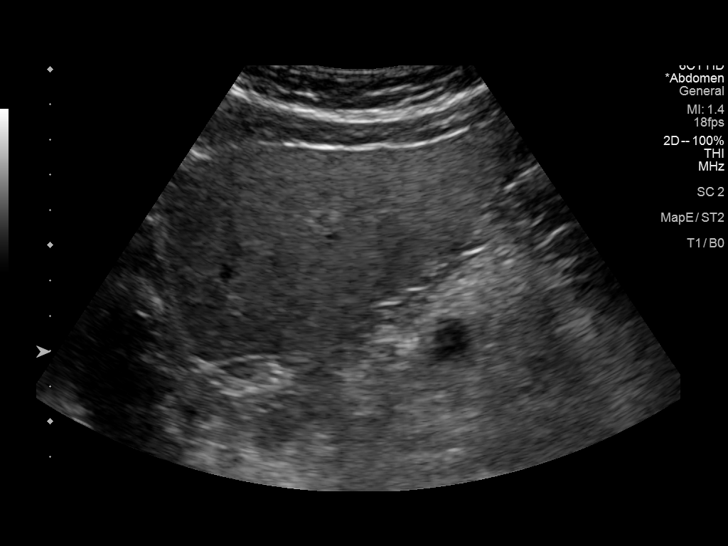
[im 10/39]
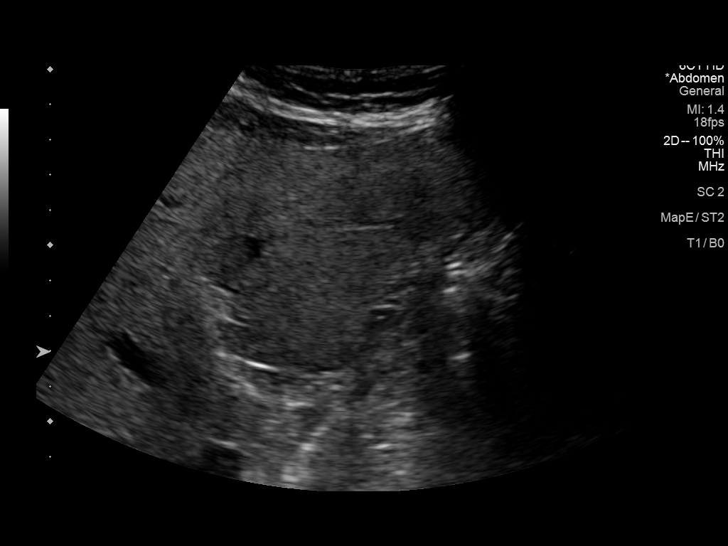
[im 13/39]
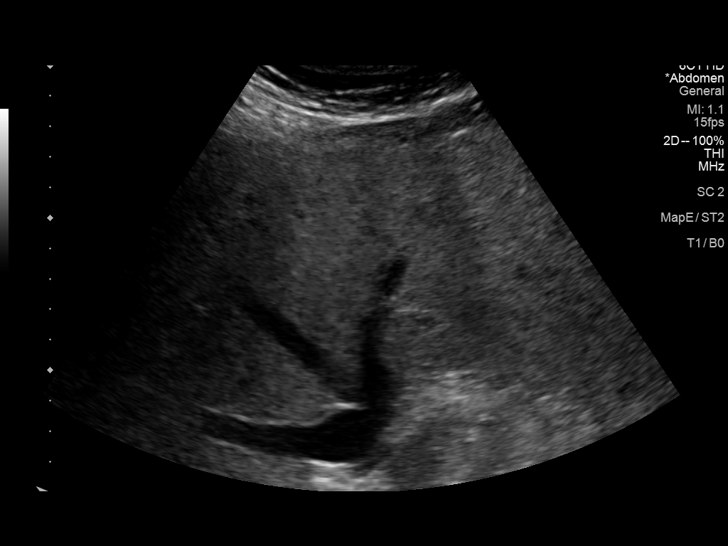
[im 15/39]
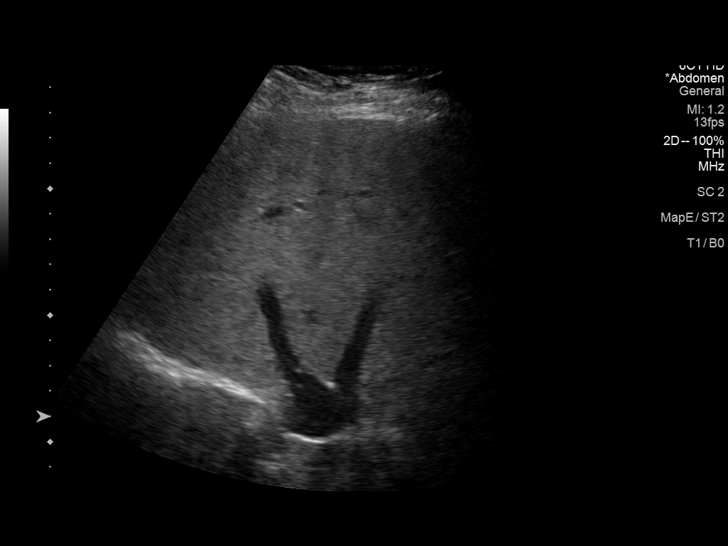
[im 18/39]
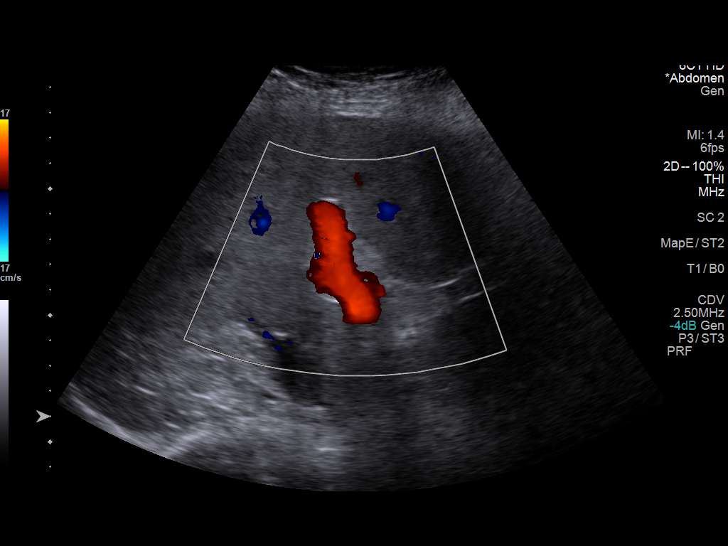
[im 21/39]
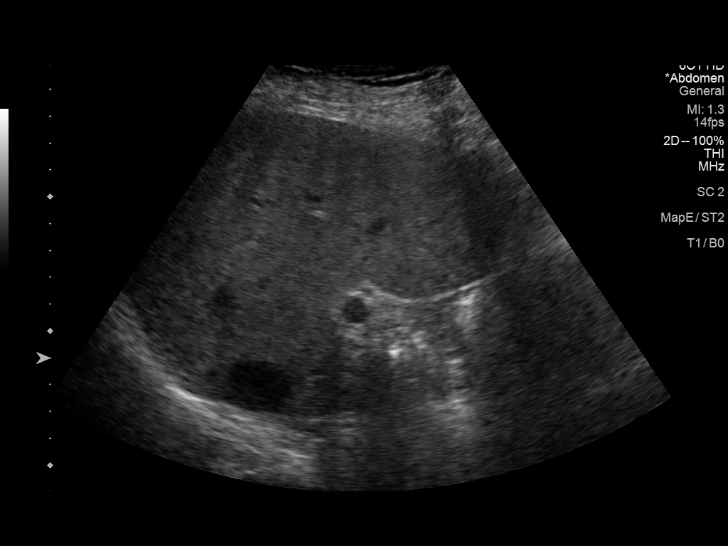
[im 24/39]
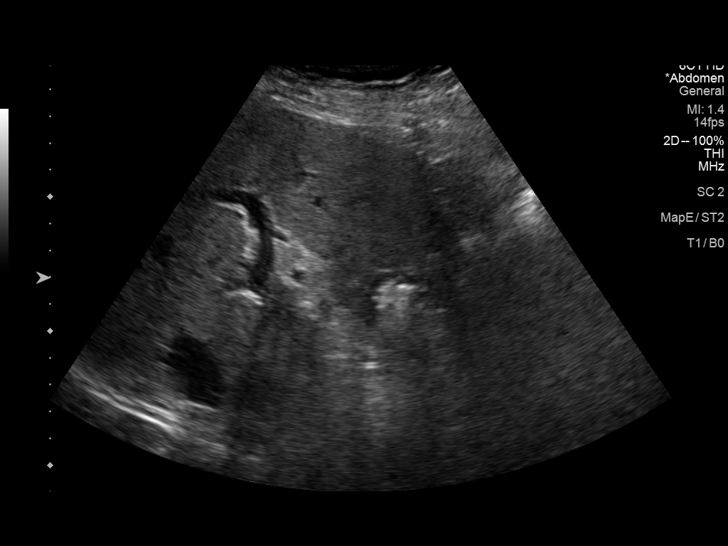
[im 26/39]
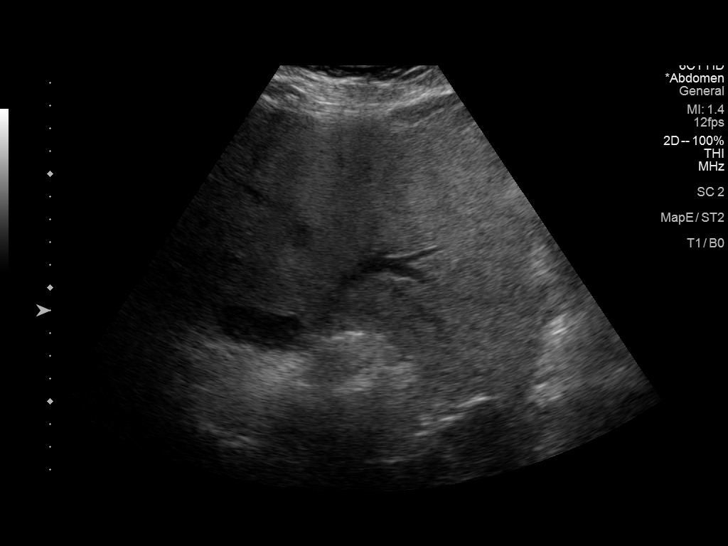
[im 29/39]
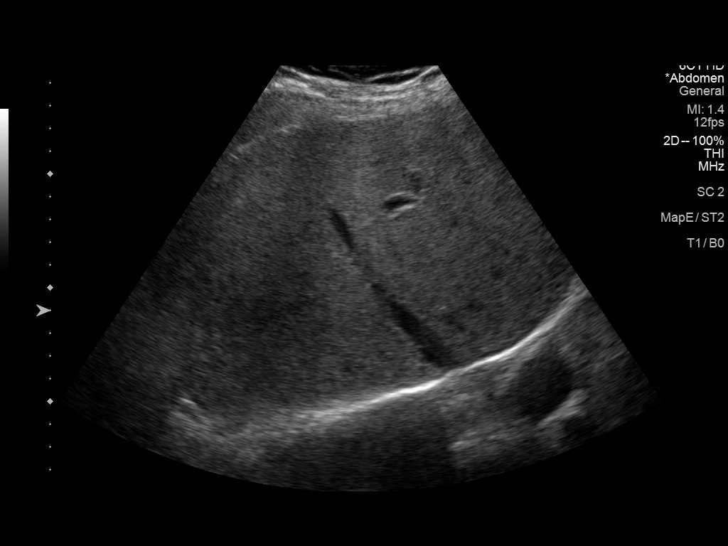
[im 32/39]
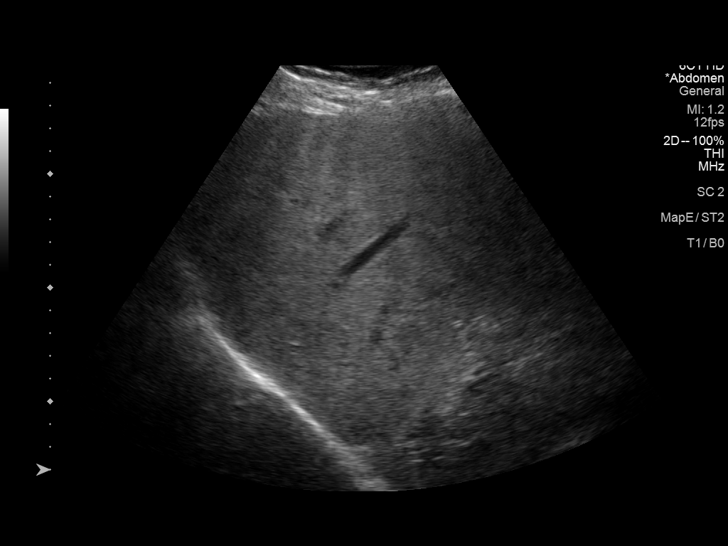
[im 35/39]
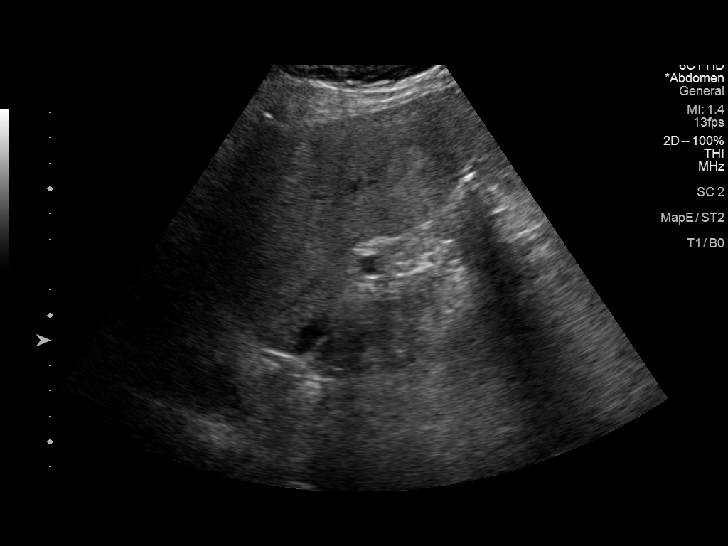
[im 39/39]
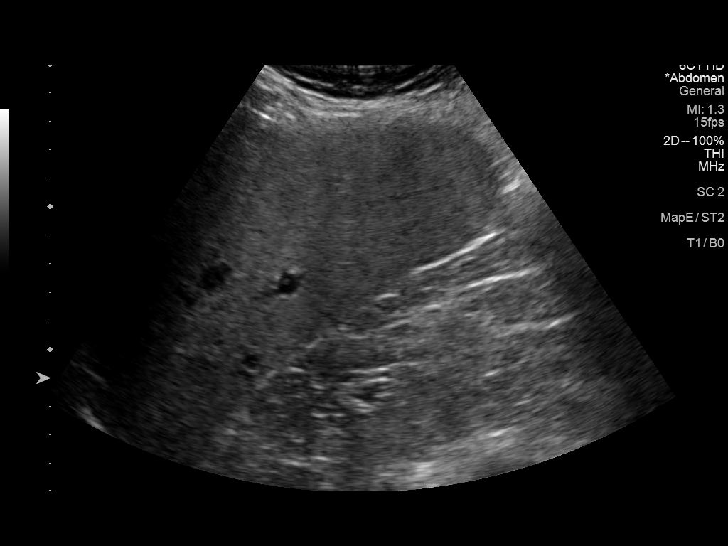

[14 of 25 positions shown; findings below may reference images not displayed]

FINDINGS: Gallbladder:

Previous cholecystectomy.

Common bile duct:

Diameter: 4.4 mm.

Liver:

No focal lesion identified. Parenchymal echogenicity is increased
uniformly compatible with a degree of steatosis without focal mass.
Portal vein is patent on color Doppler imaging with normal direction
of blood flow towards the liver.

Other: None.
IMPRESSION: 1.  No acute findings.  Prior cholecystectomy.

2.  Mild hepatic steatosis without focal mass.

## 2020-06-17 ENCOUNTER — Telehealth (HOSPITAL_COMMUNITY): Payer: Self-pay

## 2020-06-17 NOTE — Telephone Encounter (Signed)
Encounter complete. 

## 2020-06-22 ENCOUNTER — Ambulatory Visit (HOSPITAL_COMMUNITY)
Admission: RE | Admit: 2020-06-22 | Discharge: 2020-06-22 | Disposition: A | Discharge status: Home / Self Care | Payer: Medicare Other | Source: Ambulatory Visit | Attending: Cardiology | Admitting: Cardiology

## 2020-06-22 ENCOUNTER — Other Ambulatory Visit: Payer: Self-pay

## 2020-06-22 DIAGNOSIS — R0609 Other forms of dyspnea: Secondary | ICD-10-CM

## 2020-06-22 DIAGNOSIS — I2510 Atherosclerotic heart disease of native coronary artery without angina pectoris: Secondary | ICD-10-CM | POA: Diagnosis not present

## 2020-06-22 DIAGNOSIS — R0600 Dyspnea, unspecified: Secondary | ICD-10-CM | POA: Insufficient documentation

## 2020-06-22 DIAGNOSIS — R079 Chest pain, unspecified: Secondary | ICD-10-CM

## 2020-06-22 LAB — MYOCARDIAL PERFUSION IMAGING
LV dias vol: 75 mL (ref 46–106)
LV sys vol: 21 mL
Peak HR: 82 {beats}/min
Rest HR: 56 {beats}/min
SDS: 1
SRS: 1
SSS: 2
TID: 0.97

## 2020-06-22 MED ORDER — TECHNETIUM TC 99M TETROFOSMIN IV KIT
31.0000 | PACK | Freq: Once | INTRAVENOUS | Status: AC | PRN
  Administered 2020-06-22: 31 via INTRAVENOUS
  Filled 2020-06-22: qty 31

## 2020-06-22 MED ORDER — REGADENOSON 0.4 MG/5ML IV SOLN
0.4000 mg | Freq: Once | INTRAVENOUS | Status: AC
  Administered 2020-06-22: 0.4 mg via INTRAVENOUS

## 2020-06-22 MED ORDER — TECHNETIUM TC 99M TETROFOSMIN IV KIT
10.0000 | PACK | Freq: Once | INTRAVENOUS | Status: AC | PRN
  Administered 2020-06-22: 10 via INTRAVENOUS
  Filled 2020-06-22: qty 10

## 2020-06-23 ENCOUNTER — Ambulatory Visit (HOSPITAL_COMMUNITY): Payer: Medicare Other | Attending: Cardiology

## 2020-06-23 DIAGNOSIS — R079 Chest pain, unspecified: Secondary | ICD-10-CM | POA: Diagnosis present

## 2020-06-23 DIAGNOSIS — R072 Precordial pain: Secondary | ICD-10-CM | POA: Insufficient documentation

## 2020-06-23 DIAGNOSIS — R0600 Dyspnea, unspecified: Secondary | ICD-10-CM

## 2020-06-23 DIAGNOSIS — R0609 Other forms of dyspnea: Secondary | ICD-10-CM

## 2020-06-23 DIAGNOSIS — I2510 Atherosclerotic heart disease of native coronary artery without angina pectoris: Secondary | ICD-10-CM | POA: Insufficient documentation

## 2020-06-23 DIAGNOSIS — I350 Nonrheumatic aortic (valve) stenosis: Secondary | ICD-10-CM | POA: Diagnosis present

## 2020-06-24 ENCOUNTER — Other Ambulatory Visit: Payer: Self-pay | Admitting: *Deleted

## 2020-06-24 DIAGNOSIS — I350 Nonrheumatic aortic (valve) stenosis: Secondary | ICD-10-CM

## 2020-06-24 NOTE — Progress Notes (Signed)
Echocardiogram result: Again ejection fraction is 65 to 70% with dynamic/hyperdynamic left ventricular function. There does appear to be enough of her hyperdynamic squeeze that there is a pressure gradient as the blood flows out of the heart..  There is grade 1 diastolic dysfunction with evidence of higher pressures in left atrium --> this does not however show evidence of causing increased pressures in the right ventricle or lungs.  There is moderate aortic stenosis with a mean pressure gradient 27 mmHg. -> This does show a notable increase in gradient when compared to 2019. The gradient is not significant enough however to potentially cause symptoms based on most available data. However, we would want to follow-up with an echocardiogram next year to make sure there is not continue to progress at this rate.    Glenetta Hew, MD   order placed for echo  In 0ne year

## 2020-07-25 ENCOUNTER — Other Ambulatory Visit: Payer: Self-pay

## 2020-07-25 ENCOUNTER — Encounter: Payer: Self-pay | Admitting: Cardiology

## 2020-07-25 ENCOUNTER — Ambulatory Visit (INDEPENDENT_AMBULATORY_CARE_PROVIDER_SITE_OTHER): Payer: Medicare Other | Admitting: Cardiology

## 2020-07-25 VITALS — BP 120/73 | HR 78 | Temp 97.2°F | Ht 64.0 in | Wt 189.0 lb

## 2020-07-25 DIAGNOSIS — E1169 Type 2 diabetes mellitus with other specified complication: Secondary | ICD-10-CM

## 2020-07-25 DIAGNOSIS — R0600 Dyspnea, unspecified: Secondary | ICD-10-CM

## 2020-07-25 DIAGNOSIS — I10 Essential (primary) hypertension: Secondary | ICD-10-CM | POA: Diagnosis not present

## 2020-07-25 DIAGNOSIS — I350 Nonrheumatic aortic (valve) stenosis: Secondary | ICD-10-CM

## 2020-07-25 DIAGNOSIS — I2510 Atherosclerotic heart disease of native coronary artery without angina pectoris: Secondary | ICD-10-CM

## 2020-07-25 DIAGNOSIS — E785 Hyperlipidemia, unspecified: Secondary | ICD-10-CM | POA: Diagnosis not present

## 2020-07-25 DIAGNOSIS — R0609 Other forms of dyspnea: Secondary | ICD-10-CM

## 2020-07-25 MED ORDER — LOSARTAN POTASSIUM 100 MG PO TABS: 100.0000 mg | ORAL_TABLET | Freq: Every day | ORAL | 3 refills | Status: DC

## 2020-07-25 MED ORDER — DILTIAZEM HCL ER COATED BEADS 240 MG PO CP24: 240.0000 mg | ORAL_CAPSULE | Freq: Every day | ORAL | 3 refills | Status: DC

## 2020-07-25 NOTE — Patient Instructions (Signed)
Medication Instructions:  STOP- Amlodipine  START- Diltiazem 240 mg by mouth daily INCREASE- Losartan 100 mg by mouth daily  *If you need a refill on your cardiac medications before your next appointment, please call your pharmacy*   Lab Work: None Ordered   Testing/Procedures: None Ordered   Follow-Up: At [facility], you and your health needs are our priority.  As part of our continuing mission to provide you with exceptional heart care, we have created designated Provider Care Teams.  These Care Teams include your primary Cardiologist (physician) and Advanced Practice Providers (APPs -  Physician Assistants and Nurse Practitioners) who all work together to provide you with the care you need, when you need it.  We recommend signing up for the patient portal called "MyChart".  Sign up information is provided on this After Visit Summary.  MyChart is used to connect with patients for Virtual Visits (Telemedicine).  Patients are able to view lab/test results, encounter notes, upcoming appointments, etc.  Non-urgent messages can be sent to your provider as well.   To learn more about what you can do with MyChart, go to [email or web address].    Your next appointment:   6 month(s)  The format for your next appointment:   In Person  Provider:   You may see Glenetta Hew, MD or one of the following Advanced Practice Providers on your designated Care Team:    Rosaria Ferries, PA-C  Jory Sims, DNP, ANP  Cadence Kathlen Mody, PA-C

## 2020-07-25 NOTE — Progress Notes (Signed)
Primary Care Provider: Marton Redwood, MD, [place], [place] Cardiologist: Glenetta Hew, MD Electrophysiologist: None  Clinic Note: Chief Complaint  Patient presents with  . Follow-up    Test results: Echo and Myoview  . Shortness of Breath    Still has shortness of breath, but has improved, no more chest pain    HPI:    [name] [name] "[name] [name]" is a 79 y.o. female with a PMH notable for OSA (on CPAP), COPD, questionable bicuspid aortic valve below who is being seen today for the evaluation of coronary artery calcification noted on CT scan at the request of Marton Redwood, MD.   September 08, 2018:  Myoview and echo.  Nonischemic Myoview.  Echo showed mild aortic stenosis with possible functional bicuspid aortic valve.  Mean gradient was only 15 mmHg.  Only GR 1 DD noted. ->  Plan was a follow-up echo in 2 years.  She uses CPAP daily.  Sleeps well but does occasional daytime napping.  [name] [name] was last seen on May 26, 2020 for follow-up noting that she has been much less active than she had been before because of the COVID-19 restrictions.  She had been scared to go out because of her history of COPD.  She also has an unsteady gait and she was scared of walking because of fear of falling.  She was noting worsening exertional dyspnea with occasional chest tightness and chest pressure if she continued going.  Noted maybe 2 pillow "orthopnea ", but no real PND.  Mild edema.  Echo and Myoview ordered  Recent Hospitalizations:   None  Reviewed  CV studies:    The following studies were reviewed today: (if available, images/films reviewed: From Epic Chart or Care Everywhere)   Echo 06/23/2020: EF 65 to 70% with dynamic mid cavity gradient due to hyperdynamic LV (peak 28 mmHg-at rest).  Moderate LVH.  Mod AS, Mean gradient 27 mmHg   Myoview 06/22/2020: LOW RISK.  No ischemia or infarction.  EF> 65%.  Hyperdynamic.  No EKG changes.    Interval History:    [name] [name] being seen today for follow-up very happy with the results of her studies.  She said that her breathing is actually better than last week or 2 since her stress test.  She feels a little less congested and thinks it may be some shortness of breath is related to allergies and congestion.  No longer noting any chest discomfort with rest or any exertion.  Still has mild orthopnea but is probably more because of joint avoid drainage from congestion.  No PND with stable edema -> not enough that she would want to take a diuretic.  She still is fearful of walking outside-has a fear of falling  CV Review of Symptoms (Summary) Cardiovascular ROS: positive for - dyspnea on exertion, edema and Does not really have exercise tolerance, but walking better now. negative for - chest pain, irregular heartbeat, loss of consciousness, orthopnea, palpitations, paroxysmal nocturnal dyspnea, rapid heart rate, shortness of breath or Near syncope, TIA/amaurosis fugax and claudication  The patient does not have symptoms concerning for COVID-19 infection (fever, chills, cough, or new shortness of breath).  Symptoms are pretty much stable at rest The patient is practicing social distancing & Masking.  Is frustrated with precautions, but coping.  COVID-19 vaccines: Moderna  She did have Covid last fall.  REVIEWED OF SYSTEMS   Review of Systems  Constitutional: Positive for malaise/fatigue (Still has a hard time getting  motivated). Negative for weight loss.  HENT: Positive for congestion. Negative for sinus pain (Just congested LOW RISK.  No ischemia or infarction.  EF> 65%.  Hyperdynamic.  with drainage ).   Respiratory: Positive for shortness of breath (Stable) and wheezing (When allergies act up).   Gastrointestinal: Negative for abdominal pain, blood in stool, constipation and melena.  Genitourinary: Negative for hematuria.  Musculoskeletal: Positive for joint pain.  Neurological: Positive for  dizziness (When allergies act up) and headaches (When congested). Negative for focal weakness and weakness.  Endo/Heme/Allergies: Positive for environmental allergies.  Psychiatric/Behavioral: Negative for memory loss. The patient is not nervous/anxious and does not have insomnia.    I have reviewed and (if needed) personally updated the patient's problem list, medications, allergies, past medical and surgical history, social and family history.   PAST MEDICAL HISTORY   Past Medical History:  Diagnosis Date  . Aortic valve stenosis, mild    per echo 02-13-2016  possible bicuspid w/ moderate thickened and calicified ,  valve area 1.34cm^2    pt denies  . COPD (chronic obstructive pulmonary disease) ([place])   . GERD (gastroesophageal reflux disease)    no problems since started on CPAP  . Hematuria   . History of adenomatous polyp of colon    tubular adnenoma's and hyperplastic polyp's 05-19-2002;  08-15-2010;  2015  . History of kidney stones    multiple since 1970's  . History of squamous cell carcinoma excision    05-14-2012  right arm;  04-18-2015  left ankle  . HTN (hypertension)   . Hyperlipidemia   . Iron deficiency anemia   . Left ureteral stone   . OSA on CPAP    per study 10-13-2006 mild to moderate osa w/ hypersomnia  . Seasonal and perennial allergic rhinitis   . Type 2 diabetes mellitus (HCC)    type 2  . Urgency of urination    Followed by Dr. Annamaria Boots for COPD, advanced OSA on CPAP-8 (using spouses old machine from the [place] due to insurance issues).  PAST SURGICAL HISTORY   Past Surgical History:  Procedure Laterality Date  . BIOPSY  03/29/2020   Procedure: BIOPSY;  Surgeon: Clarene Essex, MD;  Location: [facility];  Service: Endoscopy;;  . CARPAL TUNNEL RELEASE Bilateral 2003  . COLONOSCOPY  last one 2015  . CT CHEST WITH CONTRAST   ([place] [facility])     Advanced aortic and branch vessel atherosclerosis. Tortuous thoracic aorta. Normal heart size, without pericardial  effusion. Multivessel coronary artery atherosclerosis  . CYSTOSCOPY WITH RETROGRADE PYELOGRAM, URETEROSCOPY AND STENT PLACEMENT Left 04/15/2017   Procedure: CYSTOSCOPY WITH LEFT  RETROGRADE PYELOGRAM, URETEROSCOPYwith stone basketry;  Surgeon: Kathie Rhodes, MD;  Location: [facility];  Service: Urology;  Laterality: Left;  . D & C HYSTEROSCOPY W/ RESECTION FIBROID AND POLYP  07/30/2000  . DOBUTAMINE STRESS ECHO  01-30-2008   [facility]   no ischemia/  normal wall motion/  post stress ef 79%  . ESOPHAGOGASTRODUODENOSCOPY (EGD) WITH PROPOFOL N/A 01/28/2018   Procedure: ESOPHAGOGASTRODUODENOSCOPY (EGD) WITH PROPOFOL;  Surgeon: Clarene Essex, MD;  Location: [facility];  Service: Endoscopy;  Laterality: N/A;  . ESOPHAGOGASTRODUODENOSCOPY (EGD) WITH PROPOFOL N/A 08/19/2019   Procedure: ESOPHAGOGASTRODUODENOSCOPY (EGD) WITH PROPOFOL;  Surgeon: Clarene Essex, MD;  [facility]: [facility];  Service: Endoscopy;  Laterality: N/A;  . ESOPHAGOGASTRODUODENOSCOPY (EGD) WITH PROPOFOL N/A 03/29/2020   Procedure: ESOPHAGOGASTRODUODENOSCOPY (EGD) WITH PROPOFOL;  Surgeon: Clarene Essex, MD;  Location: [facility];  Service: Endoscopy;  Laterality: N/A;  .  [place]   trauma   . HEMOSTASIS CLIP PLACEMENT  08/19/2019   Procedure: HEMOSTASIS CLIP PLACEMENT;  Surgeon: Clarene Essex, MD;  [facility]: [facility];  Service: Endoscopy;;  . HOT HEMOSTASIS N/A 01/28/2018   Procedure: HOT HEMOSTASIS (ARGON PLASMA COAGULATION/BICAP);  Surgeon: Clarene Essex, MD;  Location: [facility];  Service: Endoscopy;  Laterality: N/A;  . LAPAROSCOPIC CHOLECYSTECTOMY  08/20/2001   w/ ERCP spincterotomy w/ balloon  . NM MYOVIEW LTD  7/'19; 6/'21   LOW RISK.  EF > 65%. No Ischemia or Infarction. Normal Study: Hyperdynamic.  Marland Kitchen POLYPECTOMY  08/19/2019   Procedure: POLYPECTOMY;  Surgeon: Clarene Essex, MD;  Location: [facility];  Service: Endoscopy;;  . TRANSTHORACIC ECHOCARDIOGRAM  02/13/2016   LVEF 55-60%. Gr 1 DD. ?  possible bicuspid AoV w/ moderate thickened / calcified AoV w/ mild Stenosis (Mean Gradietn 15 mmHg, Valve area 1.34cm^2), no regurg.    . TRANSTHORACIC ECHOCARDIOGRAM  06/2018   EF 55-65%. Gr 1 DD. Mild AS (? Functionally bicuspid AoV with moderate leaflet thickening) - Mean Gradient 15 mmHg, Peak 32 mmHg  . TUBAL LIGATION Bilateral yrs ago   Studies from 2019 reviewed.  MEDICATIONS/ALLERGIES   Current Meds  Medication Sig  . albuterol (VENTOLIN HFA) 108 (90 Base) MCG/ACT inhaler Inhale 2 puffs into the lungs every 6 (six) hours as needed for wheezing or shortness of breath.  Marland Kitchen aspirin EC 81 MG tablet Take 81 mg by mouth at bedtime.  . Carboxymethylcellul-Glycerin (LUBRICATING EYE DROPS OP) Place 1 drop into both eyes 3 (three) times daily as needed (dry eyes).   . Cholecalciferol (VITAMIN D-3) 125 MCG (5000 UT) TABS Take 5,000 Units by mouth See admin instructions. Take 1 tablet (5000 units) by mouth every evening, except Fridays.  Marland Kitchen escitalopram (LEXAPRO) 10 MG tablet Take 10 mg by mouth every morning.   Marland Kitchen icosapent Ethyl (VASCEPA) 1 g capsule Take 2 capsules by mouth 2 (two) times daily.   . insulin aspart (NOVOLOG FLEXPEN) 100 UNIT/ML FlexPen Inject 8 Units into the skin 3 (three) times daily with meals.  . insulin detemir (LEVEMIR) 100 UNIT/ML injection Inject 40 Units into the skin daily.   Marland Kitchen losartan (COZAAR) 100 MG tablet Take 1 tablet (100 mg total) by mouth daily.  . simvastatin (ZOCOR) 20 MG tablet Take 20 mg by mouth at bedtime.   . TRULICITY 1.5 OI/7.1IW SOPN Inject 1.5 mg into the skin every Wednesday.   . Vitamin D, Ergocalciferol, (DRISDOL) 50000 units CAPS capsule Take 50,000 Units by mouth every Friday.   . [DISCONTINUED] amLODipine (NORVASC) 10 MG tablet Take 10 mg by mouth daily.   . [DISCONTINUED] losartan (COZAAR) 50 MG tablet Take 50 mg by mouth daily.  . [DISCONTINUED] MELATONIN PO Take 1 tablet by mouth at bedtime as needed (sleep).    Allergies  Allergen  Reactions  . Metformin And Related     Caused kidney issues    SOCIAL HISTORY/FAMILY HISTORY   Reviewed in Epic:  Social History   Tobacco Use  . Smoking status: Former Smoker    Packs/day: 0.25    Years: 50.00    Pack years: 12.50    Types: Cigarettes    Quit date: 12/01/2015    Years since quitting: 4.6  . Smokeless tobacco: Never Used  Vaping Use  . Vaping Use: Never used  Substance Use Topics  . Alcohol use: Yes    Alcohol/week: 0.0 standard drinks    Comment: rare  . 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   Caffeine: 1 drink/day exercise routine to because of back pain.   Family History  Problem Relation Age of Onset  . Alzheimer's disease Mother   . Colon cancer Paternal Grandfather   . Heart attack Paternal Grandfather   . Brain cancer Paternal Grandmother   . Heart disease Paternal Grandmother        not sure of details  . Heart disease Maternal Grandmother        pacemaker, heart attack  . Alzheimer's disease Maternal Grandmother   . Colon cancer Paternal Uncle   . Colon cancer Paternal Uncle   . Colon cancer Paternal Aunt   . Heart attack Maternal Uncle   . Lung cancer Maternal Uncle   . Hyperlipidemia Brother   . Hypertension Brother     OBJCTIVE -PE, EKG, labs   Wt Readings from Last 3 Encounters:  07/25/20 189 lb (85.7 kg)  06/22/20 186 lb (84.4 kg)  05/23/20 186 lb 9.6 oz (84.6 kg)    Physical Exam: BP 120/73   Pulse 78   Temp (!) 97.2 F (36.2 C)   Ht 5\' 4"  (1.626 m)   Wt 189 lb (85.7 kg)   SpO2 95%   BMI 32.44 kg/m  Physical Exam Vitals reviewed.  Constitutional:      General: She is not in acute distress.    Appearance: Normal appearance. She is well-developed. She is obese. She is not  ill-appearing.     Comments: Mildly obese.  Healthy-appearing.  Well-groomed.  HENT:     Head: Normocephalic and atraumatic.  Neck:     Vascular: No carotid bruit, hepatojugular reflux or JVD.  Cardiovascular:     Rate and Rhythm: Normal rate and regular rhythm. Occasional extrasystoles are present.    Chest Wall: PMI is not displaced (Difficult to palpate).     Pulses: Intact distal pulses.     Heart sounds: S1 normal and S2 normal. Heart sounds are distant. Murmur heard.  Medium-pitched harsh crescendo-decrescendo midsystolic murmur is present with a grade of 2/6 at the upper right sternal border radiating to the neck.  No friction rub. No gallop.   Pulmonary:     Effort: Pulmonary effort is normal. No respiratory distress.     Breath sounds: Normal breath sounds.     Comments: Only minimal interstitial sounds Chest:     Chest wall: No tenderness.  Abdominal:     Palpations: Abdomen is soft.  Musculoskeletal:        General: No swelling. Normal range of motion.     Cervical back: Normal range of motion and neck supple.  Neurological:     General: No focal deficit present.     Mental Status: She is alert and oriented to person, place, and time.  Psychiatric:        Mood and Affect: Mood normal.        Behavior: Behavior normal.        Thought Content: Thought content normal.        Judgment: Judgment normal.      Adult ECG Report Not checked   Recent Labs: January 07, 2020: TC 166, TG 275, HDL 32.  (  LDL from 01/07/2019 109)  April 27, 2020: A1c 8.8.  Hemoglobin 12.8.  CR 2.0, K+ 4.4. No results found for: CHOL, HDL, LDLCALC, LDLDIRECT, TRIG, CHOLHDL Lab Results  Component Value Date   CREATININE 2.06 (H) 06/06/2020   BUN 27 06/06/2020   NA 142 06/06/2020   K 4.9 06/06/2020   CL 104 06/06/2020   CO2 23 06/06/2020   No results found for: TSH.Marland Kitchen  Chest x-ray 04/27/2020: No active heart disease.  Hyperinflation consistent with COPD.  ASSESSMENT/PLAN    Problem List  Items Addressed This Visit    Hyperlipidemia associated with type 2 diabetes mellitus ([place]) (Chronic)    Target LDL is less than 100 currently 111.  Triglycerides also elevated. She is on the Vascepa and simvastatin -> low threshold to change from simvastatin to a different (more potent) statin such as atorvastatin or rosuvastatin      Relevant Medications   losartan (COZAAR) 100 MG tablet   Essential hypertension (Chronic)    Blood pressure looks good, but has been high of late.  I chose to switch from dihydropyridine calcium channel blocker (amlodipine) to a nondihydropyridine agent (diltiazem) -> To make up for the present antihypertensive effect, will increase losartan 100 mg      Relevant Medications   losartan (COZAAR) 100 MG tablet   diltiazem (CARDIZEM CD) 240 MG 24 hr capsule   Moderate aortic stenosis by prior echocardiogram - Primary (Chronic)    Current echo read moderate aortic stenosis with a mean gradient 27 mmHg.  There is also this hyperdynamic left ventricle with intracavity gradient.  It is possible that this intracavity gradient could be contributing to dyspnea.  Plan:  Recheck echo next year to reassess valve  With her COPD, leery of using beta-blocker, but would like to slow down her heart rate:  Convert from amlodipine to diltiazem 240 mg  Increase losartan to 100 mg for additional afterload reduction and make up for the change to diltiazem amlodipine        Relevant Medications   losartan (COZAAR) 100 MG tablet   diltiazem (CARDIZEM CD) 240 MG 24 hr capsule   Dyspnea (Chronic)    There is probably some cardiac component with diastolic dysfunction and hyperdynamic LV, however most likely major portion is pulmonary as well as obesity and deconditioning      Coronary artery calcification seen on computed tomography (Chronic)    2 back to back negative Myoview stress test in 2019 and 2021.  This would argue against significant CAD, however we still need  to be aggressive with risk factor modification.  Plan:  Start slightly closer lipid management with LDL goal less than 100  Continue aggressive glycemic control  Blood pressure control  Try to encourage increased exercise      Relevant Medications   losartan (COZAAR) 100 MG tablet   diltiazem (CARDIZEM CD) 240 MG 24 hr capsule       COVID-19 Education: The signs and symptoms of COVID-19 were discussed with the patient and how to seek care for testing (follow up with PCP or arrange E-visit).   The importance of social distancing and COVID-19 vaccination was discussed today.  I spent a total of 33minutes with the patient. >  50% of the time was spent in direct patient consultation.  Additional time spent with chart review  / charting (studies, outside notes, etc): 10 Total Time: 32 min   Current medicines are reviewed at length with the patient today.  (+/- concerns) none  Notice: This dictation was prepared with Dragon dictation along with smaller phrase technology. Any transcriptional errors that result from this process are unintentional and may not be corrected upon review.  Patient Instructions / Medication Changes & Studies & Tests Ordered   Patient Instructions  Medication Instructions:  STOP- Amlodipine  START- Diltiazem 240 mg by mouth daily INCREASE- Losartan 100 mg by mouth daily  *If you need a refill on your cardiac medications before your next appointment, please call your pharmacy*   Lab Work: None Ordered   Testing/Procedures: None Ordered   Follow-Up: At [facility], you and your health needs are our priority.  As part of our continuing mission to provide you with exceptional heart care, we have created designated Provider Care Teams.  These Care Teams include your primary Cardiologist (physician) and Advanced Practice Providers (APPs -  Physician Assistants and Nurse Practitioners) who all work together to provide you with the care you need,  when you need it.  We recommend signing up for the patient portal called "MyChart".  Sign up information is provided on this After Visit Summary.  MyChart is used to connect with patients for Virtual Visits (Telemedicine).  Patients are able to view lab/test results, encounter notes, upcoming appointments, etc.  Non-urgent messages can be sent to your provider as well.   To learn more about what you can do with MyChart, go to [email or web address].    Your next appointment:   6 month(s)  The format for your next appointment:   In Person  Provider:   You may see Glenetta Hew, MD or one of the following Advanced Practice Providers on your designated Care Team:    Rosaria Ferries, PA-C  Jory Sims, DNP, ANP  Cadence Kathlen Mody, PA-C       Studies Ordered:   No orders of the defined types were placed in this encounter.    Glenetta Hew, M.D., M.S. Interventional Cardiologist   Pager # [phone] Phone # [phone] [address] [place], [place] [address]   Thank you for choosing Heart[facility] at [facility]!!

## 2020-07-28 ENCOUNTER — Encounter: Payer: Self-pay | Admitting: Cardiology

## 2020-07-28 NOTE — Assessment & Plan Note (Signed)
Current echo read moderate aortic stenosis with a mean gradient 27 mmHg.  There is also this hyperdynamic left ventricle with intracavity gradient.  It is possible that this intracavity gradient could be contributing to dyspnea.  Plan:  Recheck echo next year to reassess valve  With her COPD, leery of using beta-blocker, but would like to slow down her heart rate:  Convert from amlodipine to diltiazem 240 mg  Increase losartan to 100 mg for additional afterload reduction and make up for the change to diltiazem amlodipine

## 2020-07-28 NOTE — Assessment & Plan Note (Signed)
Target LDL is less than 100 currently 111.  Triglycerides also elevated. She is on the Vascepa and simvastatin -> low threshold to change from simvastatin to a different (more potent) statin such as atorvastatin or rosuvastatin

## 2020-07-28 NOTE — Assessment & Plan Note (Signed)
There is probably some cardiac component with diastolic dysfunction and hyperdynamic LV, however most likely major portion is pulmonary as well as obesity and deconditioning

## 2020-07-28 NOTE — Assessment & Plan Note (Signed)
Blood pressure looks good, but has been high of late.  I chose to switch from dihydropyridine calcium channel blocker (amlodipine) to a nondihydropyridine agent (diltiazem) -> To make up for the present antihypertensive effect, will increase losartan 100 mg

## 2020-07-28 NOTE — Assessment & Plan Note (Signed)
2 back to back negative Myoview stress test in 2019 and 2021.  This would argue against significant CAD, however we still need to be aggressive with risk factor modification.  Plan:  Start slightly closer lipid management with LDL goal less than 100  Continue aggressive glycemic control  Blood pressure control  Try to encourage increased exercise

## 2020-08-03 DIAGNOSIS — E1129 Type 2 diabetes mellitus with other diabetic kidney complication: Secondary | ICD-10-CM | POA: Diagnosis not present

## 2020-08-03 DIAGNOSIS — E1149 Type 2 diabetes mellitus with other diabetic neurological complication: Secondary | ICD-10-CM | POA: Diagnosis not present

## 2020-08-03 DIAGNOSIS — E669 Obesity, unspecified: Secondary | ICD-10-CM | POA: Diagnosis not present

## 2020-08-03 DIAGNOSIS — N184 Chronic kidney disease, stage 4 (severe): Secondary | ICD-10-CM | POA: Diagnosis not present

## 2020-08-03 DIAGNOSIS — I10 Essential (primary) hypertension: Secondary | ICD-10-CM | POA: Diagnosis not present

## 2020-08-03 DIAGNOSIS — M79644 Pain in right finger(s): Secondary | ICD-10-CM | POA: Diagnosis not present

## 2020-08-03 DIAGNOSIS — E785 Hyperlipidemia, unspecified: Secondary | ICD-10-CM | POA: Diagnosis not present

## 2020-08-03 DIAGNOSIS — J449 Chronic obstructive pulmonary disease, unspecified: Secondary | ICD-10-CM | POA: Diagnosis not present

## 2020-08-24 DIAGNOSIS — Z03818 Encounter for observation for suspected exposure to other biological agents ruled out: Secondary | ICD-10-CM | POA: Diagnosis not present

## 2020-08-24 DIAGNOSIS — R519 Headache, unspecified: Secondary | ICD-10-CM | POA: Diagnosis not present

## 2020-09-12 DIAGNOSIS — I129 Hypertensive chronic kidney disease with stage 1 through stage 4 chronic kidney disease, or unspecified chronic kidney disease: Secondary | ICD-10-CM | POA: Diagnosis not present

## 2020-09-12 DIAGNOSIS — R809 Proteinuria, unspecified: Secondary | ICD-10-CM | POA: Diagnosis not present

## 2020-09-12 DIAGNOSIS — Z87442 Personal history of urinary calculi: Secondary | ICD-10-CM | POA: Diagnosis not present

## 2020-09-12 DIAGNOSIS — N184 Chronic kidney disease, stage 4 (severe): Secondary | ICD-10-CM | POA: Diagnosis not present

## 2020-09-12 DIAGNOSIS — E1122 Type 2 diabetes mellitus with diabetic chronic kidney disease: Secondary | ICD-10-CM | POA: Diagnosis not present

## 2020-09-12 DIAGNOSIS — D509 Iron deficiency anemia, unspecified: Secondary | ICD-10-CM | POA: Diagnosis not present

## 2020-09-22 DIAGNOSIS — E1129 Type 2 diabetes mellitus with other diabetic kidney complication: Secondary | ICD-10-CM | POA: Diagnosis not present

## 2020-09-22 DIAGNOSIS — Z794 Long term (current) use of insulin: Secondary | ICD-10-CM | POA: Diagnosis not present

## 2020-09-22 DIAGNOSIS — I129 Hypertensive chronic kidney disease with stage 1 through stage 4 chronic kidney disease, or unspecified chronic kidney disease: Secondary | ICD-10-CM | POA: Diagnosis not present

## 2020-09-22 DIAGNOSIS — Z23 Encounter for immunization: Secondary | ICD-10-CM | POA: Diagnosis not present

## 2020-09-22 DIAGNOSIS — N184 Chronic kidney disease, stage 4 (severe): Secondary | ICD-10-CM | POA: Diagnosis not present

## 2020-10-12 ENCOUNTER — Telehealth: Payer: Self-pay | Admitting: Internal Medicine

## 2020-10-13 NOTE — Telephone Encounter (Signed)
LMTCB x1 for pt.  

## 2020-10-18 NOTE — Telephone Encounter (Signed)
lmtcb for pt. Will sign off on encounter per protocol, as there have been two unsuccessful attempts to reach pt.

## 2020-10-26 DIAGNOSIS — Z23 Encounter for immunization: Secondary | ICD-10-CM | POA: Diagnosis not present

## 2020-12-22 DIAGNOSIS — E1129 Type 2 diabetes mellitus with other diabetic kidney complication: Secondary | ICD-10-CM | POA: Diagnosis not present

## 2020-12-22 DIAGNOSIS — N184 Chronic kidney disease, stage 4 (severe): Secondary | ICD-10-CM | POA: Diagnosis not present

## 2020-12-22 DIAGNOSIS — I129 Hypertensive chronic kidney disease with stage 1 through stage 4 chronic kidney disease, or unspecified chronic kidney disease: Secondary | ICD-10-CM | POA: Diagnosis not present

## 2020-12-22 DIAGNOSIS — Z794 Long term (current) use of insulin: Secondary | ICD-10-CM | POA: Diagnosis not present

## 2021-02-08 DIAGNOSIS — E785 Hyperlipidemia, unspecified: Secondary | ICD-10-CM | POA: Diagnosis not present

## 2021-02-08 DIAGNOSIS — I10 Essential (primary) hypertension: Secondary | ICD-10-CM | POA: Diagnosis not present

## 2021-02-14 DIAGNOSIS — Z1212 Encounter for screening for malignant neoplasm of rectum: Secondary | ICD-10-CM | POA: Diagnosis not present

## 2021-02-14 DIAGNOSIS — I10 Essential (primary) hypertension: Secondary | ICD-10-CM | POA: Diagnosis not present

## 2021-02-14 DIAGNOSIS — E119 Type 2 diabetes mellitus without complications: Secondary | ICD-10-CM | POA: Diagnosis not present

## 2021-02-14 DIAGNOSIS — R82998 Other abnormal findings in urine: Secondary | ICD-10-CM | POA: Diagnosis not present

## 2021-02-15 DIAGNOSIS — E559 Vitamin D deficiency, unspecified: Secondary | ICD-10-CM | POA: Diagnosis not present

## 2021-02-15 DIAGNOSIS — E1129 Type 2 diabetes mellitus with other diabetic kidney complication: Secondary | ICD-10-CM | POA: Diagnosis not present

## 2021-02-15 DIAGNOSIS — E785 Hyperlipidemia, unspecified: Secondary | ICD-10-CM | POA: Diagnosis not present

## 2021-02-15 DIAGNOSIS — Z0000 Encounter for general adult medical examination without abnormal findings: Secondary | ICD-10-CM | POA: Diagnosis not present

## 2021-02-15 DIAGNOSIS — F3341 Major depressive disorder, recurrent, in partial remission: Secondary | ICD-10-CM | POA: Diagnosis not present

## 2021-02-15 DIAGNOSIS — J449 Chronic obstructive pulmonary disease, unspecified: Secondary | ICD-10-CM | POA: Diagnosis not present

## 2021-02-15 DIAGNOSIS — Z1331 Encounter for screening for depression: Secondary | ICD-10-CM | POA: Diagnosis not present

## 2021-02-15 DIAGNOSIS — M859 Disorder of bone density and structure, unspecified: Secondary | ICD-10-CM | POA: Diagnosis not present

## 2021-02-15 DIAGNOSIS — Z794 Long term (current) use of insulin: Secondary | ICD-10-CM | POA: Diagnosis not present

## 2021-02-15 DIAGNOSIS — I129 Hypertensive chronic kidney disease with stage 1 through stage 4 chronic kidney disease, or unspecified chronic kidney disease: Secondary | ICD-10-CM | POA: Diagnosis not present

## 2021-02-15 DIAGNOSIS — M8580 Other specified disorders of bone density and structure, unspecified site: Secondary | ICD-10-CM | POA: Diagnosis not present

## 2021-02-15 DIAGNOSIS — E1149 Type 2 diabetes mellitus with other diabetic neurological complication: Secondary | ICD-10-CM | POA: Diagnosis not present

## 2021-02-15 DIAGNOSIS — N184 Chronic kidney disease, stage 4 (severe): Secondary | ICD-10-CM | POA: Diagnosis not present

## 2021-02-28 ENCOUNTER — Other Ambulatory Visit: Payer: Self-pay

## 2021-02-28 ENCOUNTER — Ambulatory Visit (INDEPENDENT_AMBULATORY_CARE_PROVIDER_SITE_OTHER): Payer: Medicare Other | Admitting: Cardiology

## 2021-02-28 ENCOUNTER — Encounter: Payer: Self-pay | Admitting: Cardiology

## 2021-02-28 VITALS — BP 102/62 | HR 57 | Ht 62.0 in | Wt 182.2 lb

## 2021-02-28 DIAGNOSIS — R072 Precordial pain: Secondary | ICD-10-CM | POA: Diagnosis not present

## 2021-02-28 DIAGNOSIS — G4733 Obstructive sleep apnea (adult) (pediatric): Secondary | ICD-10-CM | POA: Diagnosis not present

## 2021-02-28 DIAGNOSIS — I10 Essential (primary) hypertension: Secondary | ICD-10-CM

## 2021-02-28 DIAGNOSIS — I350 Nonrheumatic aortic (valve) stenosis: Secondary | ICD-10-CM

## 2021-02-28 DIAGNOSIS — I2510 Atherosclerotic heart disease of native coronary artery without angina pectoris: Secondary | ICD-10-CM | POA: Diagnosis not present

## 2021-02-28 DIAGNOSIS — E785 Hyperlipidemia, unspecified: Secondary | ICD-10-CM

## 2021-02-28 DIAGNOSIS — E1169 Type 2 diabetes mellitus with other specified complication: Secondary | ICD-10-CM | POA: Diagnosis not present

## 2021-02-28 NOTE — Patient Instructions (Addendum)
Medication Instructions:  Change to taking  Diltiazem 240 mg  at bedtime  strt tomorrow  March 01, 2021 No other changes    *If you need a refill on your cardiac medications before your next appointment, please call your pharmacy*   Lab Work:  Not needed  Testing/Procedures:   will be schedule at [facility]- June 2022 Your physician has requested that you have an echocardiogram. Echocardiography is a painless test that uses sound waves to create images of your heart. It provides your doctor with information about the size and shape of your heart and how well your heart's chambers and valves are working. This procedure takes approximately one hour. There are no restrictions for this procedure.  ( test is is already in recall for appointment)   Follow-Up: At [facility], you and your health needs are our priority.  As part of our continuing mission to provide you with exceptional heart care, we have created designated Provider Care Teams.  These Care Teams include your primary Cardiologist (physician) and Advanced Practice Providers (APPs -  Physician Assistants and Nurse Practitioners) who all work together to provide you with the care you need, when you need it.     Your next appointment:   5 month(s) Sept 2022  The format for your next appointment:   In Person  Provider:   Glenetta Hew, MD   Other Instructions

## 2021-02-28 NOTE — Progress Notes (Signed)
Primary Care Provider: Ginger Organ., MD Cardiologist: Glenetta Hew, MD Electrophysiologist: None  Clinic Note: Chief Complaint  Patient presents with  . Follow-up    Stable  . Aortic Stenosis  . Shortness of Breath    Stable   ===================================  ASSESSMENT/PLAN   Problem List Items Addressed This Visit    Precordial pain    Evaluated with Myoview.  Apparently coronary CTA was not approved.  Nonischemic Myoview.  Likely noncardiac chest pain      Hyperlipidemia associated with type 2 diabetes mellitus (HCC) (Chronic)    Target LDL actually would be less than 100, she is currently down to 73 the Vascepa and rosuvastatin.  Notable improvement having switch to rosuvastatin.  On Trulicity and now [place] in combination with insulin for diabetes.      Relevant Medications   rosuvastatin (CRESTOR) 10 MG tablet   Essential hypertension (Chronic)   Relevant Medications   rosuvastatin (CRESTOR) 10 MG tablet   diltiazem (CARDIZEM CD) 240 MG 24 hr capsule   Other Relevant Orders   EKG 12-Lead (Completed)   Obstructive sleep apnea (Chronic)    Continue CPAP      Moderate aortic stenosis by prior echocardiogram - Primary (Chronic)    Moderate stenosis with mean gradient 27 mmHg, and also there is intracavitary gradient of 28 mmHg.  There is probably some diastolic dysfunction associated with this as well.  She is on diltiazem to slow down her heart rate along with losartan for afterload reduction.  Plan: Recheck echo in June 2022 -> if stable, can check every 2 years        Relevant Medications   rosuvastatin (CRESTOR) 10 MG tablet   diltiazem (CARDIZEM CD) 240 MG 24 hr capsule   Other Relevant Orders   EKG 12-Lead (Completed)   Coronary artery calcification seen on computed tomography (Chronic)    She has had back-to-back negative Myoview stress test in 2019 at 2021. Likely nonischemic COPD.  Risk Factor modification as follows:  BP  well controlled on diltiazem and losartan  Lipids well within goal at 73 with LDL on Vascepa and rosuvastatin 10 mg.  A1c 8.2-working on better control-  Has been on insulin along with Trulicity (Helping with weight loss and providing cardiovascular benefit)  Recently started on Farxiga -> cardiovascular benefit, should also help mild edema.  On maintenance aspirin 81 mg daily.        Relevant Medications   rosuvastatin (CRESTOR) 10 MG tablet   diltiazem (CARDIZEM CD) 240 MG 24 hr capsule   Other Relevant Orders   EKG 12-Lead (Completed)     ===================================  HPI:    [name] T [name] "[name] [name]"  is a 80 y.o. female with a PMH notable for OSA-CPAP, COPD, and questionable Moderate Aortic Stenosis (question functional bicuspid) as well as Coronary Artery Calcification Seen on CT Scan.  She presents today for for 69-month follow-up for her aortic valve disease.  [name] [name] was last seen on July 25, 2020 to discuss results of an Echocardiogram and Myoview Stress Test that were ordered because of exertional dyspnea and chest tightness/pressure along with PND and orthopnea.  => nonischemic Myoview with hyperdynamic LV on echo and Myoview.   she actually had a mid cavity gradient today hyperdynamic LV with moderate LVH.  Also noted Moderate Aortic Stenosis. -> We stopped amlodipine start diltiazem 240 mg daily.  Also increased losartan to 1 mg daily.  Recent Hospitalizations: None  Reviewed  CV studies:  The following studies were reviewed today: (if available, images/films reviewed: From Epic Chart or Care Everywhere) . None  Interval History:   [name] [name] returns today for 66-month follow-up fairly well.  She has lost some weight because of dietary adjustment, but she still has not gotten back to being active.  She is less dyspneic, using her as needed albuterol.  She is still scared to walk far because of fear of falling, but also is scared to go out  in public.  She does not like walking upstairs mostly because of balance but also leading to dyspnea.  Fear of Covid has as her pretty much homebound.  She does not go anywhere, does not really go out to walk either.  She is becoming quite inactive overall.  From a cardiac standpoint, she still has exertional dyspnea that is stable, but notably has a tightness or pressure chest when she is coughing.  She only has edema if she is on her feet for long period time.  Less not very often.  Still has exercise intolerance-probably because of inactivity, but also because of poor balance.  CV Review of Symptoms (Summary): positive for - dyspnea on exertion, shortness of breath and Mild end of day edema when she is on her feet all day.  Chest pain with coughing. negative for - irregular heartbeat, orthopnea, palpitations, paroxysmal nocturnal dyspnea, rapid heart rate or Syncope or near syncope, TIA or amaurosis fugax.  No claudication   The patient DOES NOT have symptoms concerning for COVID-19 infection (fever, chills, cough, or new shortness of breath).   REVIEWED OF SYSTEMS   Review of Systems  Constitutional: Positive for weight loss (Try to adjust her diet.). Negative for malaise/fatigue.  HENT: Negative for congestion (Not currently, but does have it with allergies.).        She has rhinorrhea off and on.  Respiratory: Positive for cough (Off and on -worse with allergies.). Negative for shortness of breath.   Cardiovascular: Positive for chest pain (With coughing).  Gastrointestinal: Negative for blood in stool and melena.  Genitourinary: Positive for hematuria (Only with UTI).       Recent UTI-treated-no further dysuria.  Musculoskeletal: Positive for joint pain. Negative for falls.  Neurological: Positive for dizziness (Orthostatic) and headaches (With sinus congestion.).       Poor balance.  Gait instability.  Endo/Heme/Allergies: Positive for environmental allergies.  Psychiatric/Behavioral:  Negative for depression and memory loss. The patient is not nervous/anxious and does not have insomnia.        She is very fearful of going out in public.  As such, she has become quite inactive, and does not enjoy doing anything outside of the house now.  She is scared to be around people, so she does not go anywhere besides back as well as his   I have reviewed and (if needed) personally updated the patient's problem list, medications, allergies, past medical and surgical history, social and family history.   PAST MEDICAL HISTORY   Past Medical History:  Diagnosis Date  . Aortic valve stenosis, mild    per echo 02-13-2016  possible bicuspid w/ moderate thickened and calicified ,  valve area 1.34cm^2    pt denies  . COPD (chronic obstructive pulmonary disease) ([place])   . GERD (gastroesophageal reflux disease)    no problems since started on CPAP  . Hematuria   . History of adenomatous polyp of colon    tubular adnenoma's and hyperplastic polyp's 05-19-2002;  08-15-2010;  2015  . History of kidney stones    multiple since 1970's  . History of squamous cell carcinoma excision    05-14-2012  right arm;  04-18-2015  left ankle  . HTN (hypertension)   . Hyperlipidemia   . Iron deficiency anemia   . Left ureteral stone   . OSA on CPAP    per study 10-13-2006 mild to moderate osa w/ hypersomnia  . Seasonal and perennial allergic rhinitis   . Type 2 diabetes mellitus (HCC)    type 2  . Urgency of urination     PAST SURGICAL HISTORY   Past Surgical History:  Procedure Laterality Date  . BIOPSY  03/29/2020   Procedure: BIOPSY;  Surgeon: Clarene Essex, MD;  Location: [facility];  Service: Endoscopy;;  . CARPAL TUNNEL RELEASE Bilateral 2003  . COLONOSCOPY  last one 2015  . CT CHEST WITH CONTRAST   ([place] HX)     Advanced aortic and branch vessel atherosclerosis. Tortuous thoracic aorta. Normal heart size, without pericardial effusion. Multivessel coronary artery atherosclerosis  .  CYSTOSCOPY WITH RETROGRADE PYELOGRAM, URETEROSCOPY AND STENT PLACEMENT Left 04/15/2017   Procedure: CYSTOSCOPY WITH LEFT  RETROGRADE PYELOGRAM, URETEROSCOPYwith stone basketry;  Surgeon: Kathie Rhodes, MD;  Location: [facility];  Service: Urology;  Laterality: Left;  . D & C HYSTEROSCOPY W/ RESECTION FIBROID AND POLYP  07/30/2000  . DOBUTAMINE STRESS ECHO  01-30-2008   [facility]   no ischemia/  normal wall motion/  post stress ef 79%  . ESOPHAGOGASTRODUODENOSCOPY (EGD) WITH PROPOFOL N/A 01/28/2018   Procedure: ESOPHAGOGASTRODUODENOSCOPY (EGD) WITH PROPOFOL;  Surgeon: Clarene Essex, MD;  Location: [facility];  Service: Endoscopy;  Laterality: N/A;  . ESOPHAGOGASTRODUODENOSCOPY (EGD) WITH PROPOFOL N/A 08/19/2019   Procedure: ESOPHAGOGASTRODUODENOSCOPY (EGD) WITH PROPOFOL;  Surgeon: Clarene Essex, MD;  Location: [facility];  Service: Endoscopy;  Laterality: N/A;  . ESOPHAGOGASTRODUODENOSCOPY (EGD) WITH PROPOFOL N/A 03/29/2020   Procedure: ESOPHAGOGASTRODUODENOSCOPY (EGD) WITH PROPOFOL;  Surgeon: Clarene Essex, MD;  Location: [facility];  Service: Endoscopy;  Laterality: N/A;  . [place]   trauma   . HEMOSTASIS CLIP PLACEMENT  08/19/2019   Procedure: HEMOSTASIS CLIP PLACEMENT;  Surgeon: Clarene Essex, MD;  Location: [facility];  Service: Endoscopy;;  . HOT HEMOSTASIS N/A 01/28/2018   Procedure: HOT HEMOSTASIS (ARGON PLASMA COAGULATION/BICAP);  Surgeon: Clarene Essex, MD;  Location: [facility];  Service: Endoscopy;  Laterality: N/A;  . LAPAROSCOPIC CHOLECYSTECTOMY  08/20/2001   w/ ERCP spincterotomy w/ balloon  . NM MYOVIEW LTD  7/'19; 6/'21   LOW RISK.  EF > 65%. No Ischemia or Infarction. Normal Study: Hyperdynamic.  Marland Kitchen POLYPECTOMY  08/19/2019   Procedure: POLYPECTOMY;  Surgeon: Clarene Essex, MD;  Location: [facility];  Service: Endoscopy;;  . TRANSTHORACIC ECHOCARDIOGRAM  02/13/2016   LVEF 55-60%. Gr 1 DD. ? possible bicuspid AoV w/ moderate thickened / calcified AoV w/  mild Stenosis (Mean Gradietn 15 mmHg, Valve area 1.34cm^2), no regurg.    . TRANSTHORACIC ECHOCARDIOGRAM  06/2018   EF 55-65%. Gr 1 DD. Mild AS (? Functionally bicuspid AoV with moderate leaflet thickening) - Mean Gradient 15 mmHg, Peak 32 mmHg  . TUBAL LIGATION Bilateral yrs ago    Echo 06/23/2020: EF 65 to 70% with dynamic mid cavity gradient due to hyperdynamic LV (peak 28 mmHg-at rest).  Moderate LVH.  Mod AS, Mean gradient 27 mmHg   Immunization History  Administered Date(s) Administered  . Influenza Split 11/01/2011, 09/30/2012, 10/31/2013, 12/01/2015, 09/12/2016  . Influenza-Unspecified 10/05/2014  .  Moderna Sars-Covid-2 Vaccination 01/23/2020, 02/20/2020, 10/27/2020    MEDICATIONS/ALLERGIES   Current Meds  Medication Sig  . albuterol (VENTOLIN HFA) 108 (90 Base) MCG/ACT inhaler Inhale 2 puffs into the lungs every 6 (six) hours as needed for wheezing or shortness of breath.  Jearl Klinefelter ELLIPTA 62.5-25 MCG/INH AEPB Inhale 1 puff into the lungs daily.  Marland Kitchen aspirin EC 81 MG tablet Take 81 mg by mouth at bedtime.  . Carboxymethylcellul-Glycerin (LUBRICATING EYE DROPS OP) Place 1 drop into both eyes 3 (three) times daily as needed (dry eyes).   . Cholecalciferol (VITAMIN D-3) 125 MCG (5000 UT) TABS Take 5,000 Units by mouth See admin instructions. Take 1 tablet (5000 units) by mouth every evening, except Fridays.  Marland Kitchen diltiazem (CARDIZEM CD) 240 MG 24 hr capsule Take 240 mg by mouth daily.  Marland Kitchen escitalopram (LEXAPRO) 10 MG tablet Take 10 mg by mouth every morning.   . ferrous sulfate 325 (65 FE) MG tablet Take 325 mg by mouth daily with breakfast.  . icosapent Ethyl (VASCEPA) 1 g capsule Take 2 capsules by mouth 2 (two) times daily.   . insulin aspart (NOVOLOG FLEXPEN) 100 UNIT/ML FlexPen Inject 8 Units into the skin 3 (three) times daily with meals.  . insulin detemir (LEVEMIR) 100 UNIT/ML injection Inject 40 Units into the skin daily.   Marland Kitchen losartan (COZAAR) 100 MG tablet Take 1 tablet (100 mg  total) by mouth daily.  . rosuvastatin (CRESTOR) 10 MG tablet Take 10 mg by mouth at bedtime.  . TRULICITY 1.5 OE/7.0JJ SOPN Inject 1.5 mg into the skin every Wednesday.   . Vitamin D, Ergocalciferol, (DRISDOL) 50000 units CAPS capsule Take 50,000 Units by mouth every Friday.   . zinc gluconate 50 MG tablet Take 50 mg by mouth daily.  . [DISCONTINUED] diltiazem (CARDIZEM CD) 240 MG 24 hr capsule Take 1 capsule (240 mg total) by mouth daily.    Allergies  Allergen Reactions  . Metformin And Related     Caused kidney issues    SOCIAL HISTORY/FAMILY HISTORY   Reviewed in Epic:  Pertinent findings:  Social History   Tobacco Use  . Smoking status: Former Smoker    Packs/day: 0.25    Years: 50.00    Pack years: 12.50    Types: Cigarettes    Quit date: 12/01/2015    Years since quitting: 5.2  . Smokeless tobacco: Never Used  Vaping Use  . Vaping Use: Never used  Substance Use Topics  . Alcohol use: Yes    Alcohol/week: 0.0 standard drinks    Comment: rare  . 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   Caffeine: 1 drink/day exercise routine to because of back pain.    OBJCTIVE -PE, EKG, labs   Wt Readings from Last 3 Encounters:  02/28/21 182 lb 3.2 oz (82.6 kg)  07/25/20 189 lb (85.7 kg)  06/22/20 186 lb (84.4 kg)    Physical Exam: BP 102/62   Pulse (!) 57   Ht 5\' 2"  (1.575 m)   Wt 182 lb 3.2 oz (82.6 kg)   SpO2 94%   BMI 33.32 kg/m  Physical Exam Vitals reviewed.  Constitutional:      General: She is  not in acute distress.    Appearance: Normal appearance. She is obese. She is not ill-appearing or toxic-appearing.  HENT:     Head: Normocephalic and atraumatic.     Comments: Wearing K N95 mask Neck:     Vascular: No  carotid bruit, hepatojugular reflux or JVD.  Cardiovascular:     Rate and Rhythm: Normal rate and regular rhythm.  No extrasystoles are present.    Pulses: Normal pulses and intact distal pulses.     Heart sounds: S1 normal. Heart sounds are distant. Murmur heard.   Harsh crescendo-decrescendo early systolic murmur is present with a grade of 2/6 at the upper right sternal border radiating to the neck. No friction rub. No gallop.   Pulmonary:     Effort: Pulmonary effort is normal. No respiratory distress.     Comments: Minimal interstitial sounds, no wheezes, rales or rhonchi. Chest:     Chest wall: No tenderness.  Musculoskeletal:        General: Swelling (Trivial puffy swelling) present. Normal range of motion.     Cervical back: Normal range of motion and neck supple.  Skin:    General: Skin is warm and dry.  Neurological:     General: No focal deficit present.     Mental Status: She is alert and oriented to person, place, and time.  Psychiatric:        Mood and Affect: Mood normal.        Behavior: Behavior normal.        Thought Content: Thought content normal.        Judgment: Judgment normal.     Adult ECG Report  Rate: 57 ;  Rhythm: sinus bradycardia and CRO Anterior MI, age indeterminate;   Narrative Interpretation: stable   Recent Labs:   02/08/2021:  TC 150; TG 191, HDL 39, LDL 73. (June 2021-TC 173, TG 395, HDL 38, LDL 56);  Apo B 96 (upper normal is 90)  Na+ 140, K+ 5.3, Cl- 108, HCO3-23, BUN 27, Cr 2.1, Glu 136, Ca2+ 8.9; AST 32, ALT 33, AlkP 93  CBC: W 13.91, H/H 13.9/41.7, Plt 243; TSH 2.28  02/14/2021:  Urine microalbumin 30, urine creatinine 87.1-ratio 34.4.  Urine Culture: Enterococcus faecalis, E. coli No results found for: CHOL, HDL, LDLCALC, LDLDIRECT, TRIG, CHOLHDL Lab Results  Component Value Date   CREATININE 2.06 (H) 06/06/2020   BUN 27 06/06/2020   NA 142 06/06/2020   K 4.9 06/06/2020   CL 104 06/06/2020   CO2 23 06/06/2020   CBC  Latest Ref Rng & Units 04/15/2017 01/17/2017  WBC 3.9 - 10.3 10e3/uL - 10.9(H)  Hemoglobin 12.0 - 15.0 g/dL 11.9(L) 11.0(L)  Hematocrit 36.0 - 46.0 % 35.0(L) 36.2  Platelets 145 - 400 10e3/uL - 350    No results found for: TSH  ==================================================  COVID-19 Education: The signs and symptoms of COVID-19 were discussed with the patient and how to seek care for testing (follow up with PCP or arrange E-visit).   The importance of social distancing and COVID-19 vaccination was discussed today.  She is hypervigilant the point where she does not go anywhere, and does not like even going outside. The patient is practicing social distancing & Masking.   I spent a total of 79minutes with the patient spent in direct patient consultation.  Additional time spent with chart review  / charting (studies, outside notes, etc): 23 min Total Time: 46 min   Current medicines are reviewed at length with the patient  today.  (+/- concerns) n/a  This visit occurred during the SARS-CoV-2 public health emergency.  Safety protocols were in place, including screening questions prior to the visit, additional usage of staff PPE, and extensive cleaning of exam room while observing appropriate contact time as indicated for disinfecting solutions.  Notice: This dictation was prepared with Dragon dictation along with smaller phrase technology. Any transcriptional errors that result from this process are unintentional and may not be corrected upon review.  Patient Instructions / Medication Changes & Studies & Tests Ordered   Patient Instructions  Medication Instructions:  Change to taking  Diltiazem 240 mg  at bedtime  strt tomorrow  March 01, 2021 No other changes    *If you need a refill on your cardiac medications before your next appointment, please call your pharmacy*   Lab Work:  Not needed  Testing/Procedures:   will be schedule at [facility]- June  2022 Your physician has requested that you have an echocardiogram. Echocardiography is a painless test that uses sound waves to create images of your heart. It provides your doctor with information about the size and shape of your heart and how well your heart's chambers and valves are working. This procedure takes approximately one hour. There are no restrictions for this procedure.  ( test is is already in recall for appointment)   Follow-Up: At [facility], you and your health needs are our priority.  As part of our continuing mission to provide you with exceptional heart care, we have created designated Provider Care Teams.  These Care Teams include your primary Cardiologist (physician) and Advanced Practice Providers (APPs -  Physician Assistants and Nurse Practitioners) who all work together to provide you with the care you need, when you need it.     Your next appointment:   5 month(s) Sept 2022  The format for your next appointment:   In Person  Provider:   Glenetta Hew, MD   Other Instructions     Studies Ordered:   Orders Placed This Encounter  Procedures  . EKG 12-Lead     Glenetta Hew, M.D., M.S. Interventional Cardiologist   Pager # [phone] Phone # [phone] [address]. [place], [place] [address]   Thank you for choosing Heartcare at [facility]!!

## 2021-03-05 ENCOUNTER — Encounter: Payer: Self-pay | Admitting: Cardiology

## 2021-03-05 NOTE — Assessment & Plan Note (Signed)
Continue CPAP.  

## 2021-03-05 NOTE — Assessment & Plan Note (Addendum)
Target LDL actually would be less than 100, she is currently down to 73 the Vascepa and rosuvastatin.  Notable improvement having switch to rosuvastatin.  On Trulicity and now [place] in combination with insulin for diabetes.

## 2021-03-05 NOTE — Assessment & Plan Note (Addendum)
Evaluated with Myoview.  Apparently coronary CTA was not approved.  Nonischemic Myoview.  Likely noncardiac chest pain

## 2021-03-05 NOTE — Assessment & Plan Note (Signed)
Moderate stenosis with mean gradient 27 mmHg, and also there is intracavitary gradient of 28 mmHg.  There is probably some diastolic dysfunction associated with this as well.  She is on diltiazem to slow down her heart rate along with losartan for afterload reduction.  Plan: Recheck echo in June 2022 -> if stable, can check every 2 years

## 2021-03-05 NOTE — Assessment & Plan Note (Signed)
She has had back-to-back negative Myoview stress test in 2019 at 2021. Likely nonischemic COPD.  Risk Factor modification as follows:  BP well controlled on diltiazem and losartan  Lipids well within goal at 73 with LDL on Vascepa and rosuvastatin 10 mg.  A1c 8.2-working on better control-  Has been on insulin along with Trulicity (Helping with weight loss and providing cardiovascular benefit)  Recently started on Farxiga -> cardiovascular benefit, should also help mild edema.  On maintenance aspirin 81 mg daily.

## 2021-03-10 DIAGNOSIS — E1122 Type 2 diabetes mellitus with diabetic chronic kidney disease: Secondary | ICD-10-CM | POA: Diagnosis not present

## 2021-03-10 DIAGNOSIS — I129 Hypertensive chronic kidney disease with stage 1 through stage 4 chronic kidney disease, or unspecified chronic kidney disease: Secondary | ICD-10-CM | POA: Diagnosis not present

## 2021-03-10 DIAGNOSIS — D509 Iron deficiency anemia, unspecified: Secondary | ICD-10-CM | POA: Diagnosis not present

## 2021-03-10 DIAGNOSIS — Z87442 Personal history of urinary calculi: Secondary | ICD-10-CM | POA: Diagnosis not present

## 2021-03-10 DIAGNOSIS — N184 Chronic kidney disease, stage 4 (severe): Secondary | ICD-10-CM | POA: Diagnosis not present

## 2021-03-10 DIAGNOSIS — R809 Proteinuria, unspecified: Secondary | ICD-10-CM | POA: Diagnosis not present

## 2021-03-14 ENCOUNTER — Other Ambulatory Visit (HOSPITAL_COMMUNITY): Payer: Self-pay | Admitting: Internal Medicine

## 2021-03-14 DIAGNOSIS — Z1231 Encounter for screening mammogram for malignant neoplasm of breast: Secondary | ICD-10-CM

## 2021-03-15 NOTE — Progress Notes (Signed)
Patient ID: [name] [name], female    DOB: 05/16/1941, 80 y.o.   MRN: [id number]  HPI F former smoker- Followed for allergic rhinitis, hypersomnia w/ OSA, COPD, complicated by DM, HBP, GERD NPSG 10/10/06 RDI/AHI 15.6/hr PFT 12/09/2015-moderate obstruction, diffusion severely reduced.  FVC 1.6/59%, FEV1 1.18/58%, ratio 0.74, TLC 81%, DLCO 42%  ----------------------------------------------------------------------------    03/16/20- 80 year old female former smoker followed for OSA, COPD, Lung Nodules, allergic rhinitis, complicated by DM 2, HBP, GERD, HBP, Aortic Atherosclerosis,  -----f/u OSA/COPD.  CPAP 8/ [employer]   (Name on SD card is Derenda Fennel- she had his machine reset for her) Download- compliance 93%, AHI 0.3/ hr Uses CPAP every day. Sleeping well. Occ nap. Frustrated but coping with Covid precautions. Had Moderna Covax x 2 Body weight today 184 lbs Anoro, albuterol hfa, Melatonin Notes stable DOE, getting little exercise. No cough or wheeze. Hasn't been using inhaler Lung nodules stable, c/w benign. CT chest 10/05/2019- IMPRESSION: Stable left lung pulmonary nodules most compatible with benign process given stability over time. Aortic Atherosclerosis (ICD10-I70.0) and Emphysema (ICD10-J43.9).  03/16/21- 80 year old female former smoker followed for OSA, COPD, Lung Nodules, allergic rhinitis, complicated by DM 2, HBP, GERD, HBP, Aortic Atherosclerosis,  -Anoro, albuterol hfa, Melatonin CPAP 8/ [employer] Download-compliance 100%, AHI 0.8/ hr Body weight today-181 lbs  Covid vax-3 Moderna Flu vax-had -----1 year follow up OSA. Doing well on cpap.  Download reviewed. Still using SD card with name "Derenda Fennel" Some DOE- gets no exercise. Some postnasal drip.   Review of Systems- see HPI    + = positive Constitutional:   No-   weight loss, night sweats, fevers, chills, fatigue, lassitude. HEENT:   No-   headaches, difficulty swallowing, tooth/dental problems, sore throat,                   No-   sneezing, itching, ear ache, no-nasal congestion, post nasal drip,  CV:  No-   chest pain, orthopnea, PND, swelling in lower extremities, anasarca, dizziness, palpitations GI:  No-   heartburn, indigestion, abdominal pain, nausea, vomiting,  Resp:   No-  excess mucus,             No-   productive cough,  No non-productive cough,  No-  coughing up of blood.              No-   change in color of mucus.  No- wheezing.   Skin: No-   rash or lesions. GU:  MS:  No-   joint pain or swelling.  Psych:  No- change in mood or affect. No depression or anxiety.  No memory loss.   Objective:   Physical Exam General- Alert, Oriented, Affect-appropriate, Distress- none acute, +overweight Skin- rash-none, lesions- none, excoriation- none Lymphadenopathy- none Head- atraumatic            Eyes- Gross vision intact, PERRLA, conjunctivae clear secretions            Ears- Hearing, canals-normal            Nose- Clear, no-Septal dev, mucus, polyps, erosion, perforation             Throat- Mallampati III-IV , mucosa-not unusually dry , drainage- none,  tonsils- atrophic Neck- flexible , trachea midline, no stridor , thyroid nl, carotid no bruit Chest - symmetrical excursion , unlabored           Heart/CV- RRR , no murmur , no gallop  , no rub, nl s1 s2                           -  JVD- none , edema- none, stasis changes- none, varices- none           Lung- + clear, unlabored, no-wheezing,cough- none, dullness-none, rub- none           Chest wall-  Abd-  Br/ Gen/ Rectal- Not done, not indicated Extrem- cyanosis- none, clubbing, none, atrophy- none, strength- nl Neuro- grossly intact to observation

## 2021-03-16 ENCOUNTER — Ambulatory Visit (INDEPENDENT_AMBULATORY_CARE_PROVIDER_SITE_OTHER): Payer: Medicare Other | Admitting: Internal Medicine

## 2021-03-16 ENCOUNTER — Encounter: Payer: Self-pay | Admitting: Internal Medicine

## 2021-03-16 ENCOUNTER — Other Ambulatory Visit: Payer: Self-pay

## 2021-03-16 DIAGNOSIS — I2510 Atherosclerotic heart disease of native coronary artery without angina pectoris: Secondary | ICD-10-CM

## 2021-03-16 DIAGNOSIS — J449 Chronic obstructive pulmonary disease, unspecified: Secondary | ICD-10-CM | POA: Diagnosis not present

## 2021-03-16 DIAGNOSIS — G4733 Obstructive sleep apnea (adult) (pediatric): Secondary | ICD-10-CM

## 2021-03-16 NOTE — Patient Instructions (Signed)
We can continue CPAP 8  Ok to continue current inhalers  Please call if we can help

## 2021-03-22 ENCOUNTER — Ambulatory Visit (HOSPITAL_COMMUNITY)
Admission: RE | Admit: 2021-03-22 | Discharge: 2021-03-22 | Disposition: A | Discharge status: Home / Self Care | Payer: Medicare Other | Source: Ambulatory Visit | Attending: Internal Medicine | Admitting: Internal Medicine

## 2021-03-22 ENCOUNTER — Other Ambulatory Visit: Payer: Self-pay

## 2021-03-22 DIAGNOSIS — Z1231 Encounter for screening mammogram for malignant neoplasm of breast: Secondary | ICD-10-CM | POA: Insufficient documentation

## 2021-03-22 IMAGING — MG MM DIGITAL SCREENING BILAT W/ TOMO AND CAD
6 of 10 series · 6 of 30 positions shown · non-contrast
Comparison: Previous exam(s).

CLINICAL DATA: Screening.

EXAM:
DIGITAL SCREENING BILATERAL MAMMOGRAM WITH TOMOSYNTHESIS AND CAD
TECHNIQUE: Bilateral screening digital craniocaudal and mediolateral oblique
mammograms were obtained. Bilateral screening digital breast
tomosynthesis was performed. The images were evaluated with
computer-aided detection.

[L CC synth-2D]
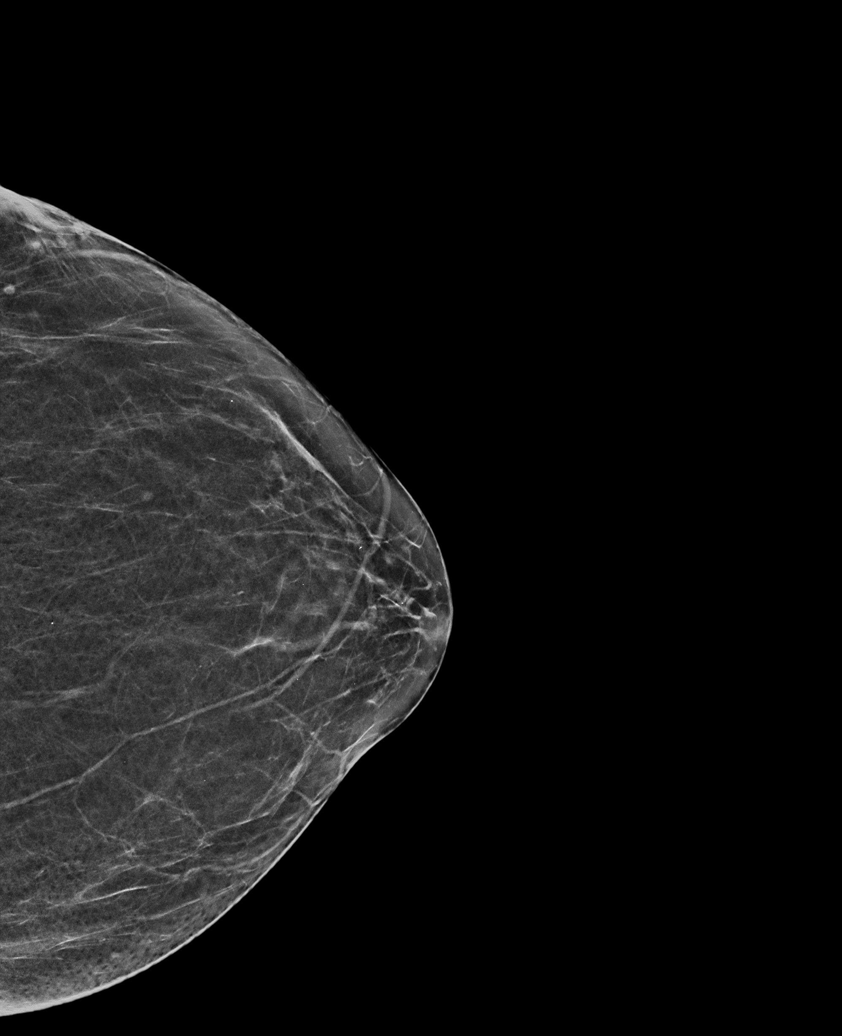

[R MLO synth-2D]
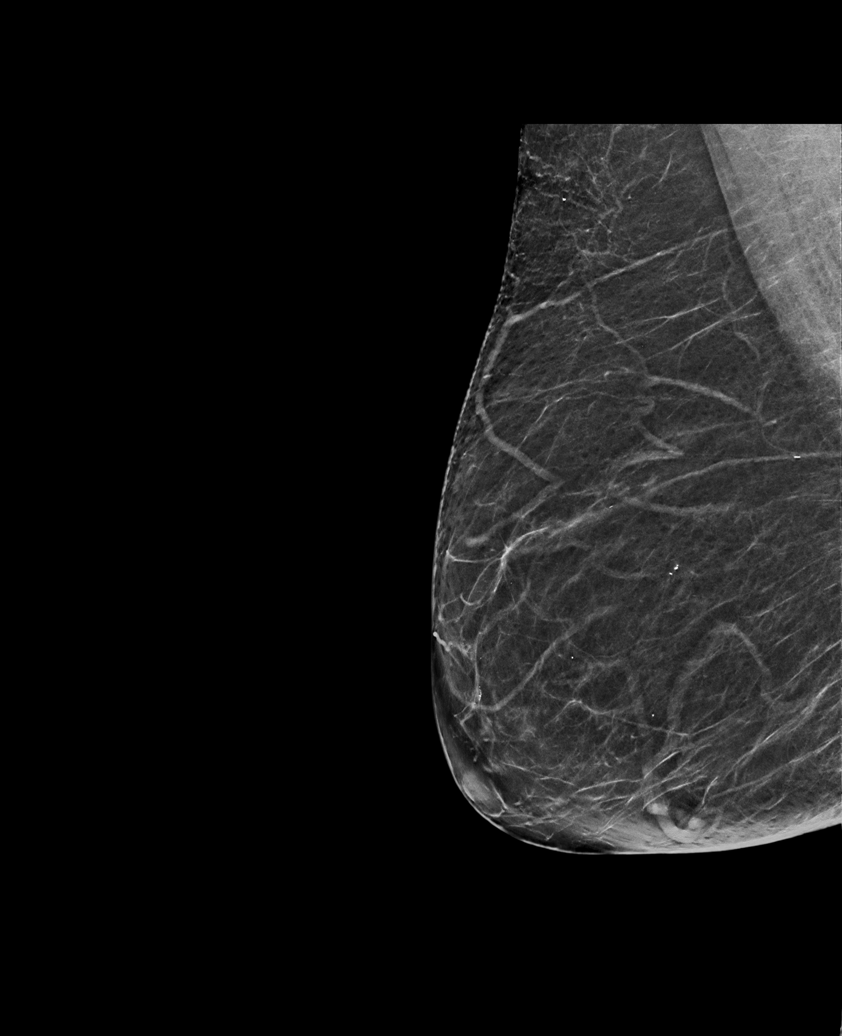

[L MLO synth-2D]
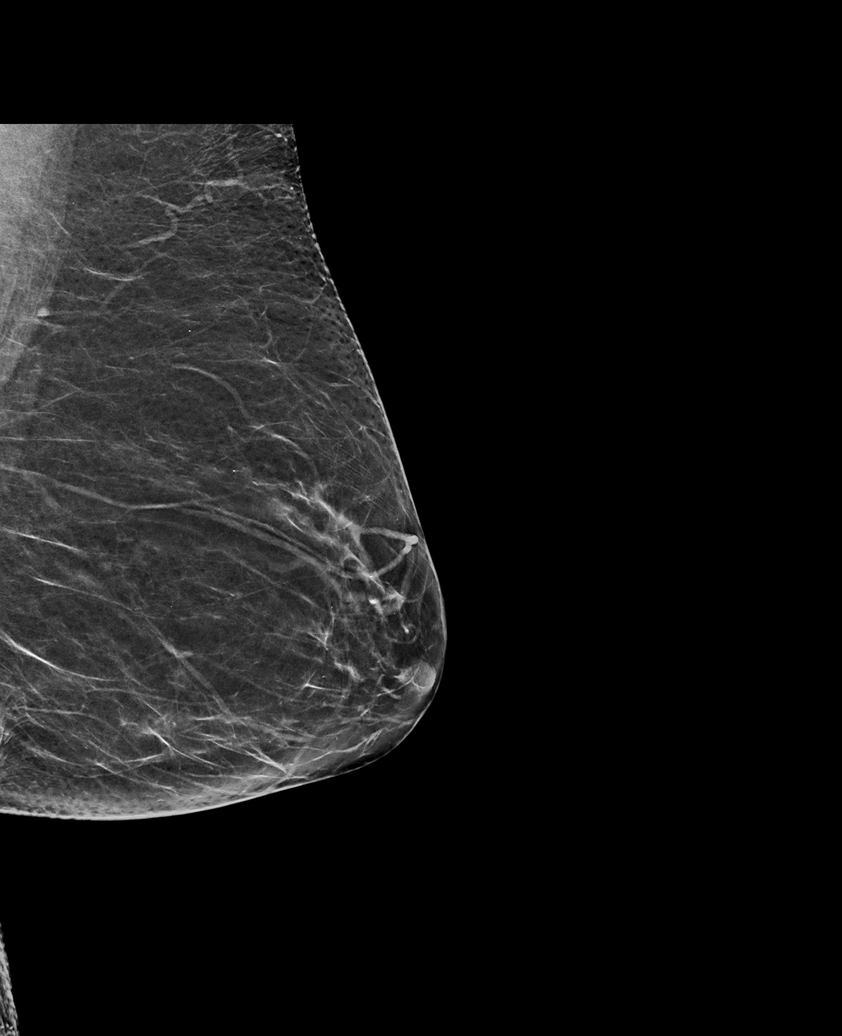

[R CC synth-2D (1 of 2)]
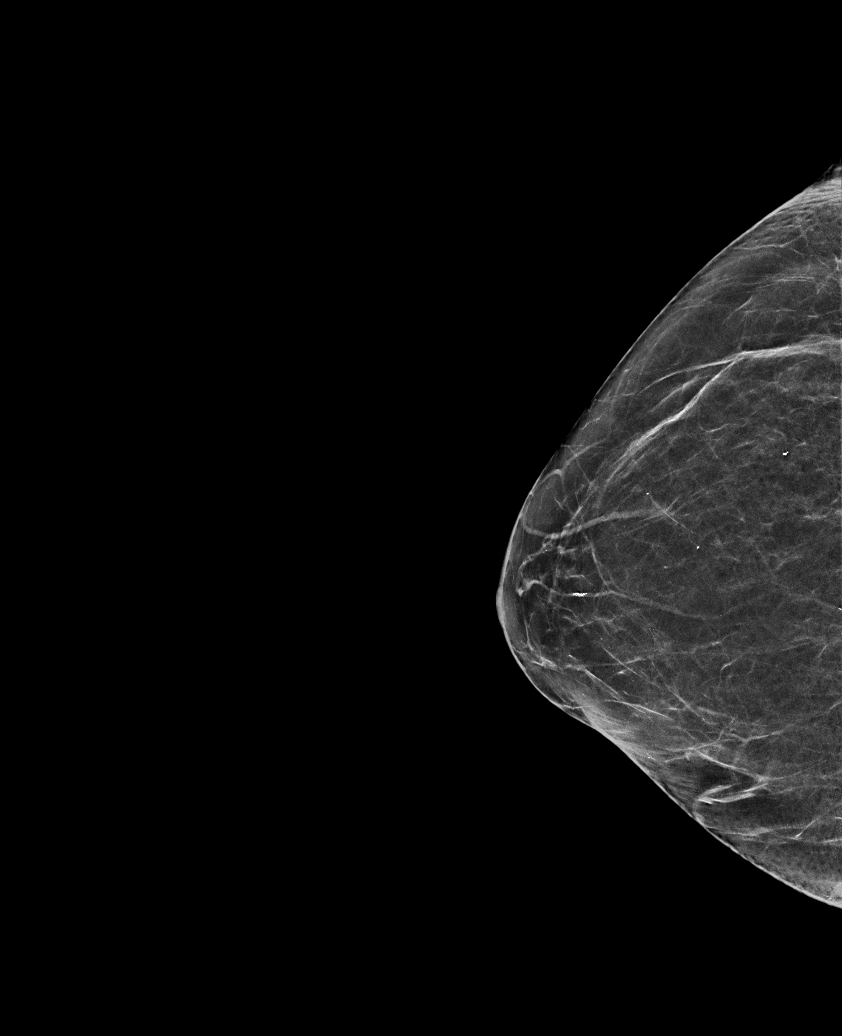

[R CC synth-2D (2 of 2)]
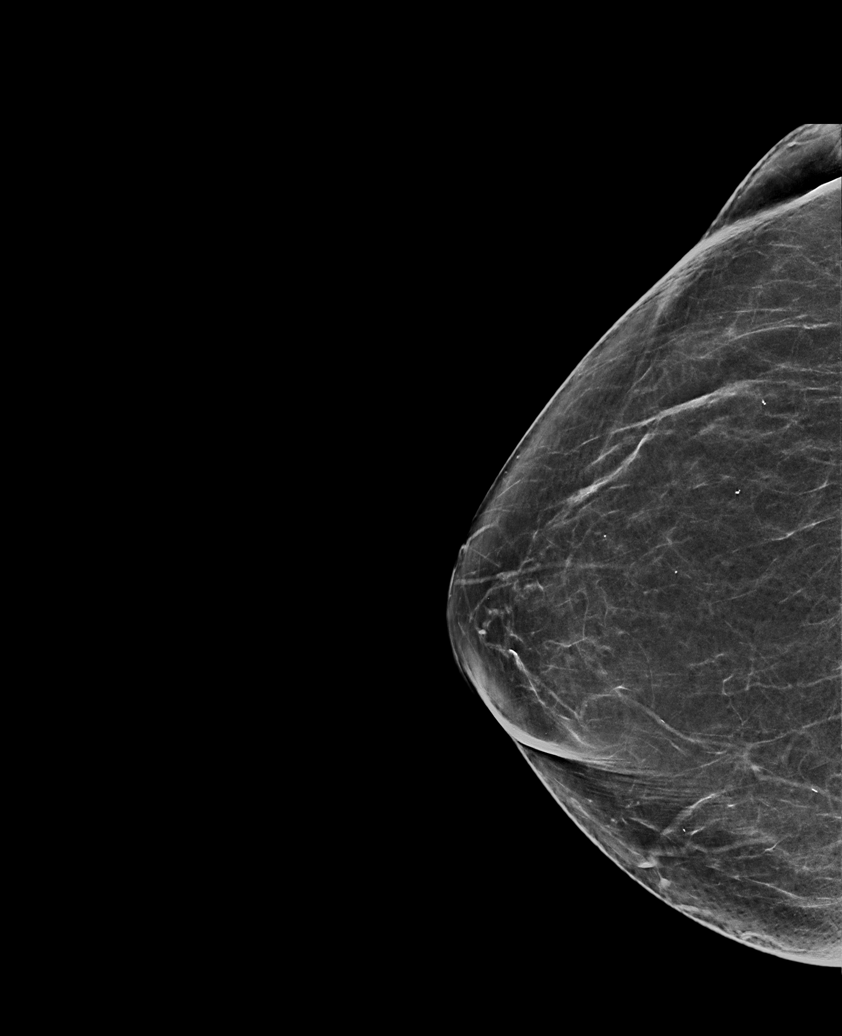

[R CC tomo · tomo slice 29/56.0]
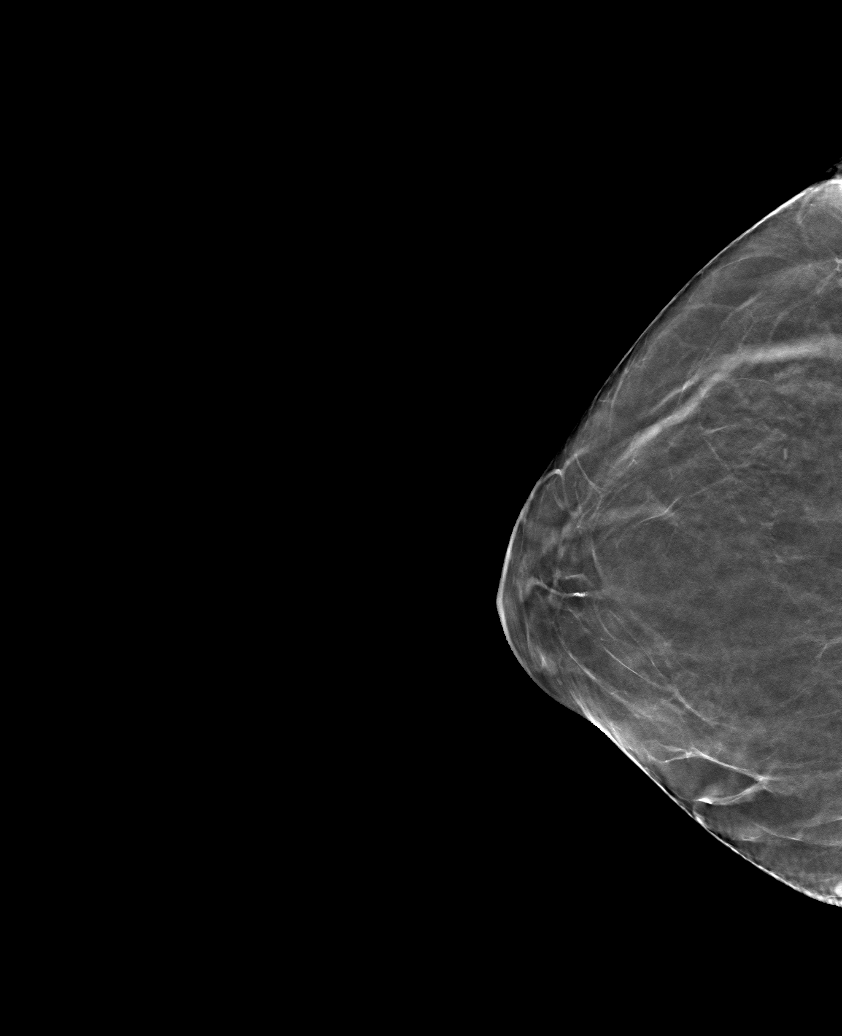

[6 of 30 positions shown; findings below may reference images not displayed]

ACR Breast Density Category b: There are scattered areas of
fibroglandular density.
FINDINGS: There are no findings suspicious for malignancy. The images were
evaluated with computer-aided detection.
IMPRESSION: No mammographic evidence of malignancy. A result letter of this
screening mammogram will be mailed directly to the patient.

RECOMMENDATION:
Screening mammogram in one year. (Code:[OD])

BI-RADS CATEGORY  1: Negative.

## 2021-03-23 DIAGNOSIS — I129 Hypertensive chronic kidney disease with stage 1 through stage 4 chronic kidney disease, or unspecified chronic kidney disease: Secondary | ICD-10-CM | POA: Diagnosis not present

## 2021-03-23 DIAGNOSIS — I10 Essential (primary) hypertension: Secondary | ICD-10-CM | POA: Diagnosis not present

## 2021-03-23 DIAGNOSIS — N184 Chronic kidney disease, stage 4 (severe): Secondary | ICD-10-CM | POA: Diagnosis not present

## 2021-03-23 DIAGNOSIS — E1129 Type 2 diabetes mellitus with other diabetic kidney complication: Secondary | ICD-10-CM | POA: Diagnosis not present

## 2021-03-23 DIAGNOSIS — E785 Hyperlipidemia, unspecified: Secondary | ICD-10-CM | POA: Diagnosis not present

## 2021-03-23 DIAGNOSIS — Z794 Long term (current) use of insulin: Secondary | ICD-10-CM | POA: Diagnosis not present

## 2021-03-30 ENCOUNTER — Encounter (HOSPITAL_COMMUNITY): Payer: Self-pay | Admitting: Emergency Medicine

## 2021-03-30 ENCOUNTER — Emergency Department (HOSPITAL_COMMUNITY): Payer: Medicare Other

## 2021-03-30 ENCOUNTER — Emergency Department (HOSPITAL_COMMUNITY)
Admission: EM | Admit: 2021-03-30 | Discharge: 2021-03-30 | Disposition: A | Discharge status: Home / Self Care | Payer: Medicare Other | Attending: Emergency Medicine | Admitting: Emergency Medicine

## 2021-03-30 ENCOUNTER — Other Ambulatory Visit: Payer: Self-pay

## 2021-03-30 DIAGNOSIS — M79651 Pain in right thigh: Secondary | ICD-10-CM | POA: Diagnosis not present

## 2021-03-30 DIAGNOSIS — I10 Essential (primary) hypertension: Secondary | ICD-10-CM | POA: Insufficient documentation

## 2021-03-30 DIAGNOSIS — M76891 Other specified enthesopathies of right lower limb, excluding foot: Secondary | ICD-10-CM | POA: Diagnosis not present

## 2021-03-30 DIAGNOSIS — E119 Type 2 diabetes mellitus without complications: Secondary | ICD-10-CM | POA: Diagnosis not present

## 2021-03-30 DIAGNOSIS — R52 Pain, unspecified: Secondary | ICD-10-CM

## 2021-03-30 DIAGNOSIS — M25561 Pain in right knee: Secondary | ICD-10-CM | POA: Insufficient documentation

## 2021-03-30 DIAGNOSIS — J449 Chronic obstructive pulmonary disease, unspecified: Secondary | ICD-10-CM | POA: Diagnosis not present

## 2021-03-30 DIAGNOSIS — M778 Other enthesopathies, not elsewhere classified: Secondary | ICD-10-CM | POA: Diagnosis not present

## 2021-03-30 DIAGNOSIS — Z794 Long term (current) use of insulin: Secondary | ICD-10-CM | POA: Diagnosis not present

## 2021-03-30 DIAGNOSIS — Z7952 Long term (current) use of systemic steroids: Secondary | ICD-10-CM | POA: Diagnosis not present

## 2021-03-30 DIAGNOSIS — Z7982 Long term (current) use of aspirin: Secondary | ICD-10-CM | POA: Insufficient documentation

## 2021-03-30 DIAGNOSIS — M79604 Pain in right leg: Secondary | ICD-10-CM | POA: Diagnosis not present

## 2021-03-30 DIAGNOSIS — M1611 Unilateral primary osteoarthritis, right hip: Secondary | ICD-10-CM | POA: Diagnosis not present

## 2021-03-30 DIAGNOSIS — Z87891 Personal history of nicotine dependence: Secondary | ICD-10-CM | POA: Diagnosis not present

## 2021-03-30 DIAGNOSIS — M25551 Pain in right hip: Secondary | ICD-10-CM | POA: Insufficient documentation

## 2021-03-30 DIAGNOSIS — Z79899 Other long term (current) drug therapy: Secondary | ICD-10-CM | POA: Insufficient documentation

## 2021-03-30 DIAGNOSIS — M160 Bilateral primary osteoarthritis of hip: Secondary | ICD-10-CM | POA: Diagnosis not present

## 2021-03-30 DIAGNOSIS — Z85828 Personal history of other malignant neoplasm of skin: Secondary | ICD-10-CM | POA: Insufficient documentation

## 2021-03-30 LAB — CBG MONITORING, ED: Glucose-Capillary: 151 mg/dL — ABNORMAL HIGH (ref 70–99)

## 2021-03-30 IMAGING — DX DG KNEE COMPLETE 4+V*R*
4 series · 4 of 4 positions shown · non-contrast
Comparison: None.

CLINICAL DATA: Pain in lateral aspect of right femur and knee,
symptoms for several months

EXAM:
RIGHT KNEE - COMPLETE 4+ VIEW; RIGHT FEMUR 2 VIEWS

[knee ap]
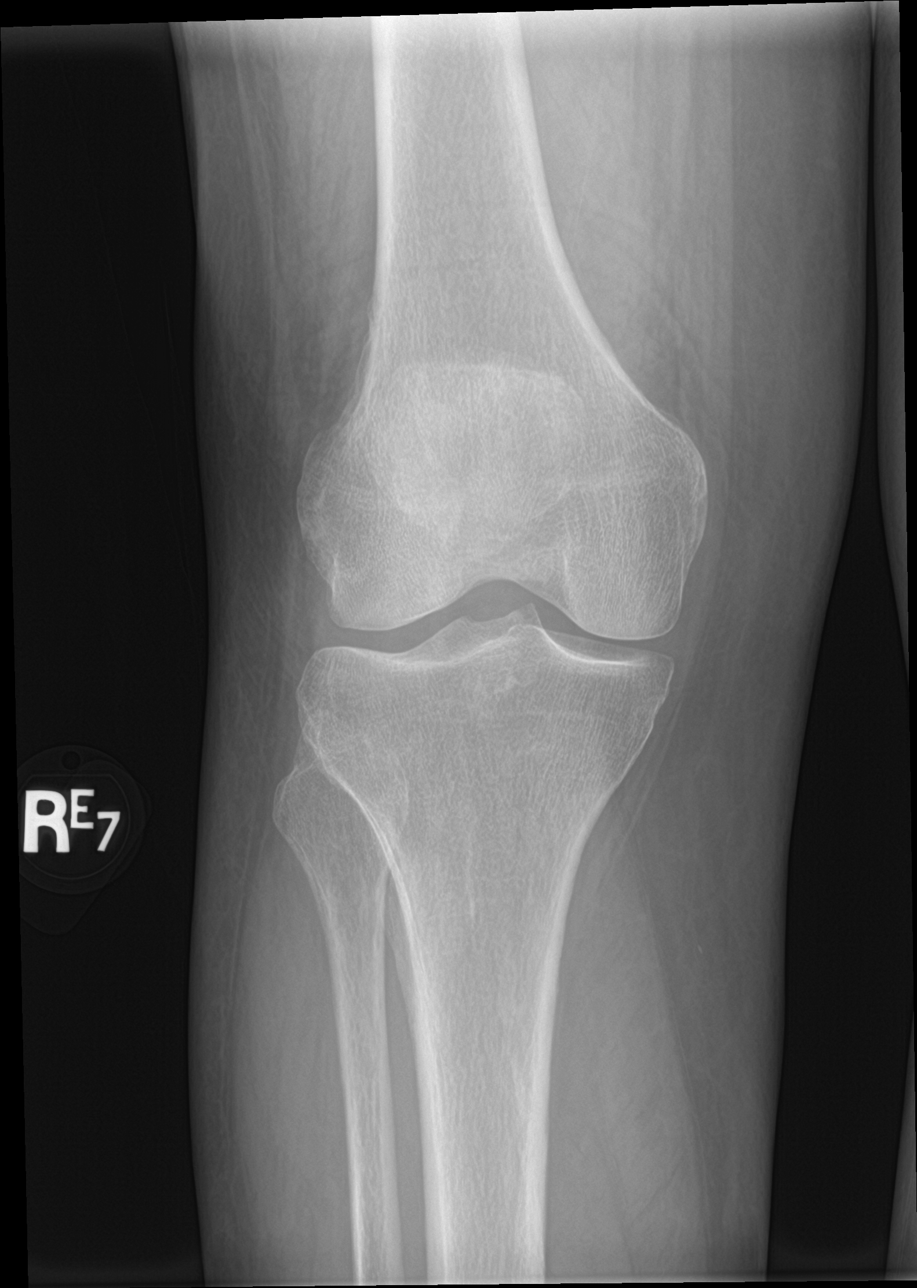

[knee obl (1 of 2)]
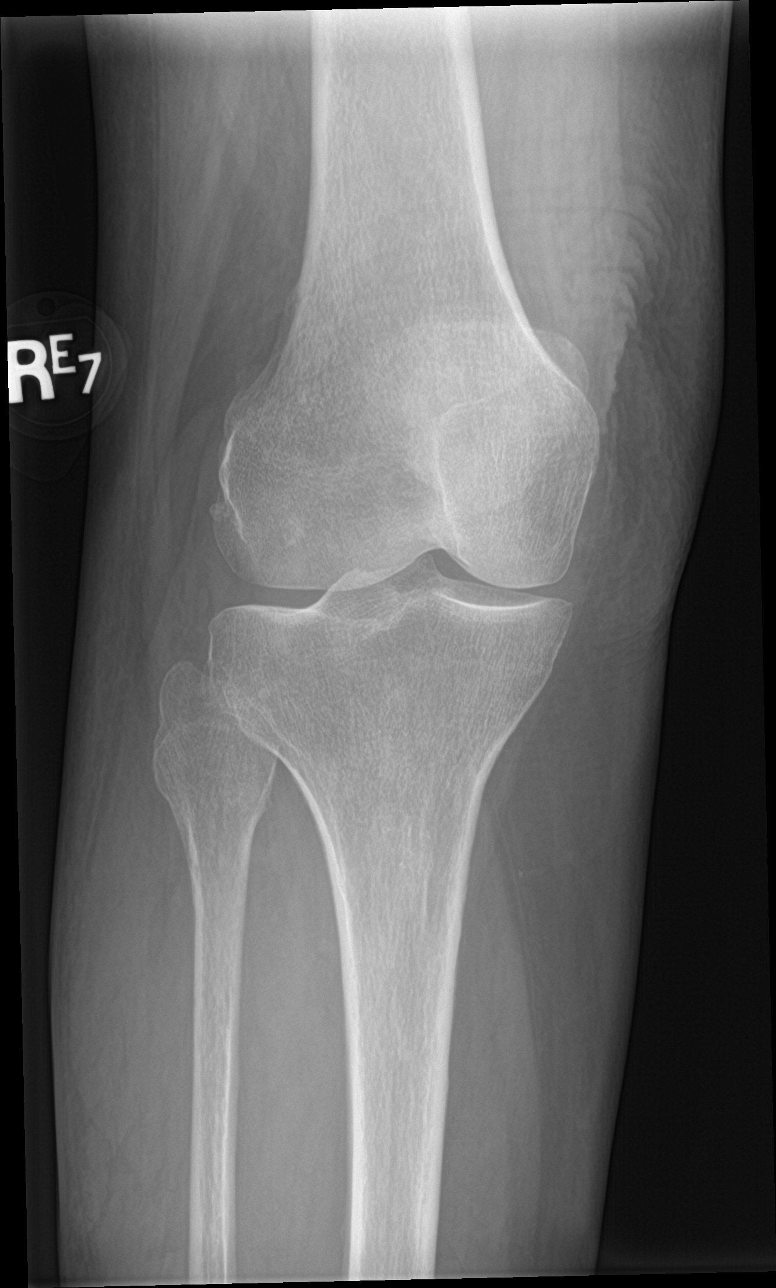

[knee obl (2 of 2)]
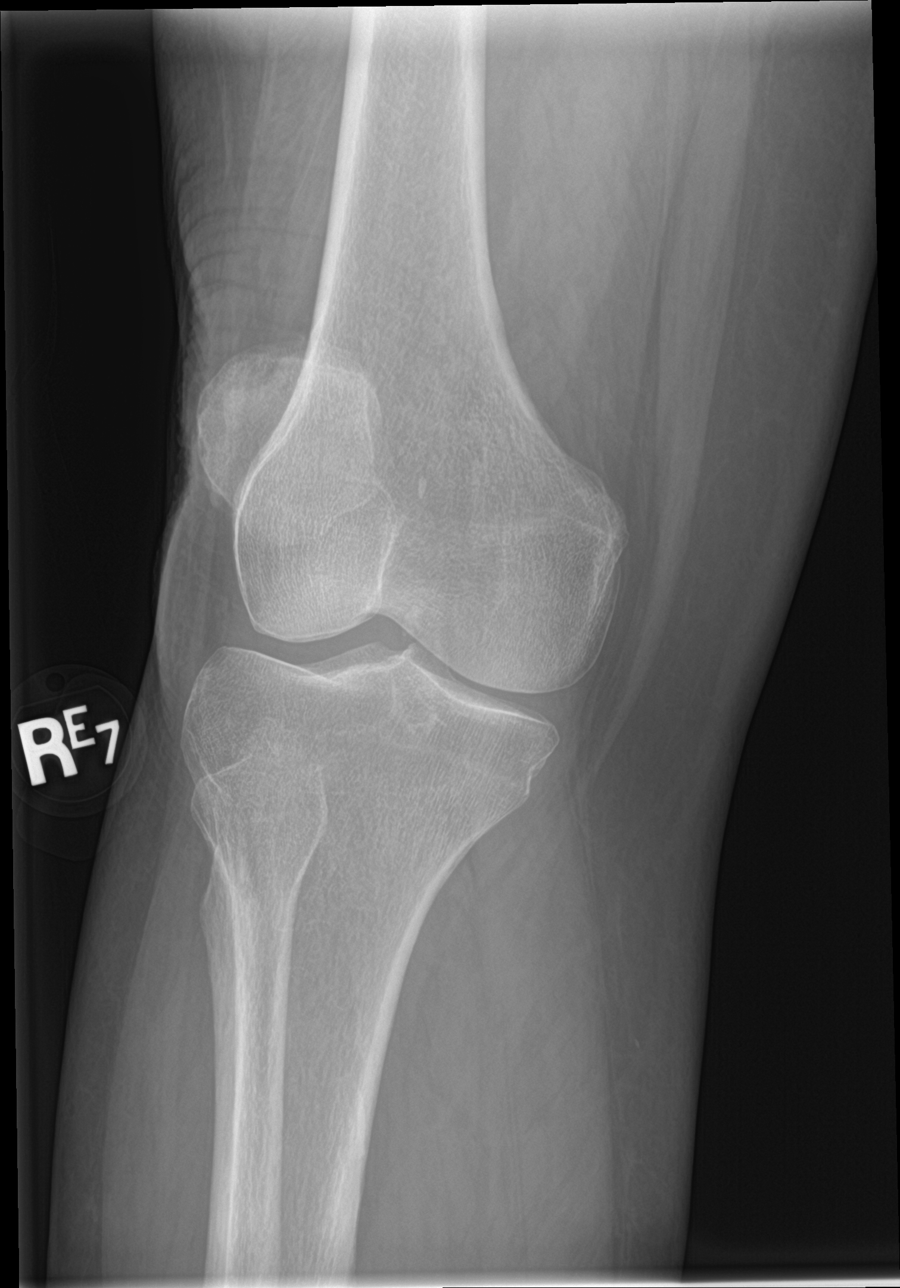

[knee lat]
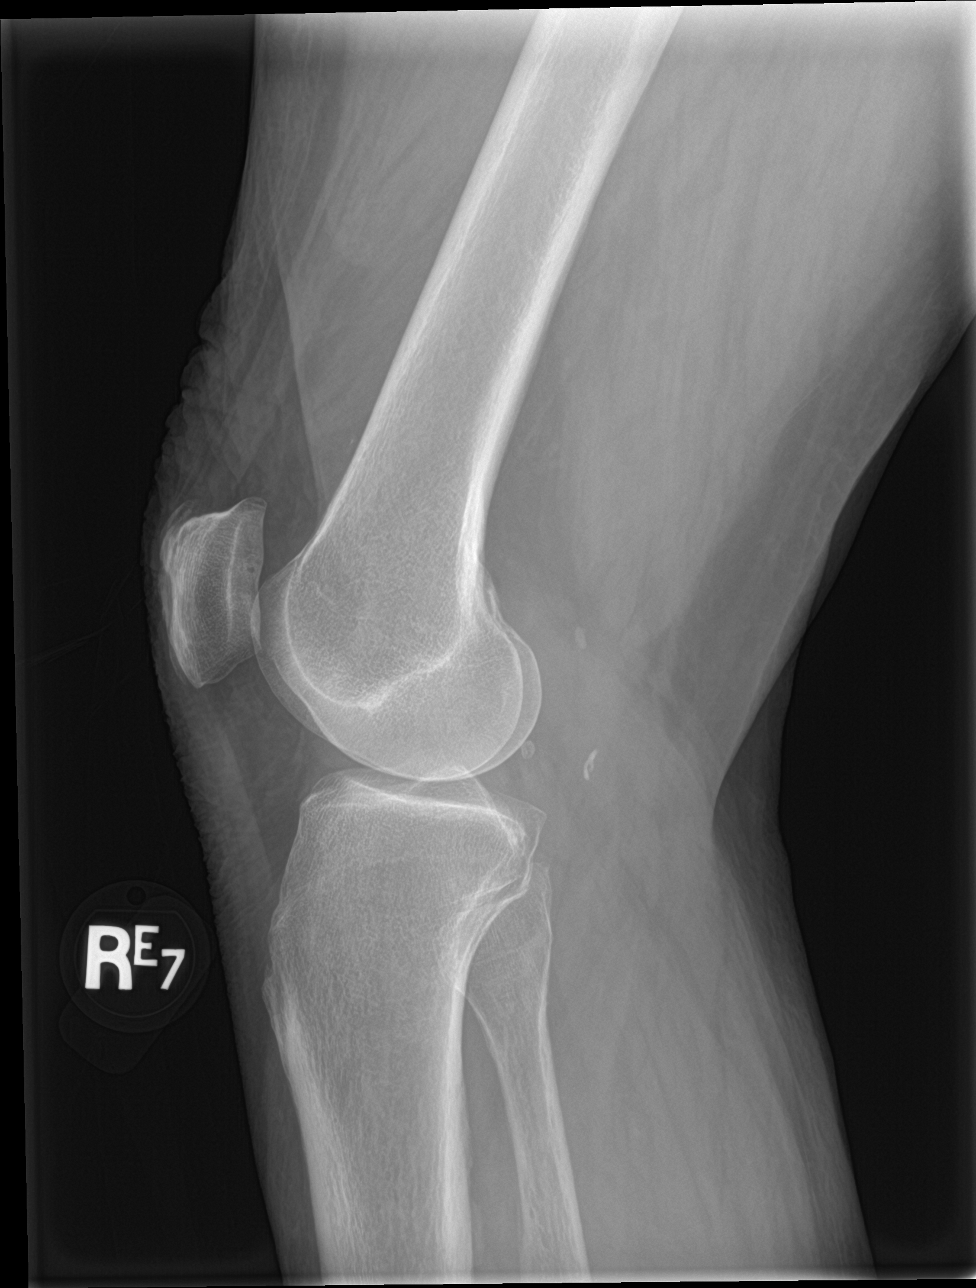

[4 of 4 positions shown; findings below may reference images not displayed]

FINDINGS: Right femur: Frontal and lateral views of the right femur are
obtained. No acute fractures. Mild right hip osteoarthritis.
Alignment is anatomic. Soft tissues are grossly normal.

Right knee: Frontal, bilateral oblique, lateral views of the right
knee are obtained. No fracture, subluxation, or dislocation. Joint
spaces are well preserved. Enthesopathic changes most pronounced at
the upper pole of the patella. No joint effusion. Soft tissues are
unremarkable.
IMPRESSION: 1. Mild right hip osteoarthritis.
2. No acute fracture involving the right femur or right knee.

## 2021-03-30 IMAGING — DX DG FEMUR 2+V*R*
4 series · 4 of 4 positions shown · non-contrast
Comparison: None.

CLINICAL DATA: Pain in lateral aspect of right femur and knee,
symptoms for several months

EXAM:
RIGHT KNEE - COMPLETE 4+ VIEW; RIGHT FEMUR 2 VIEWS

[femur ap (1 of 2)]
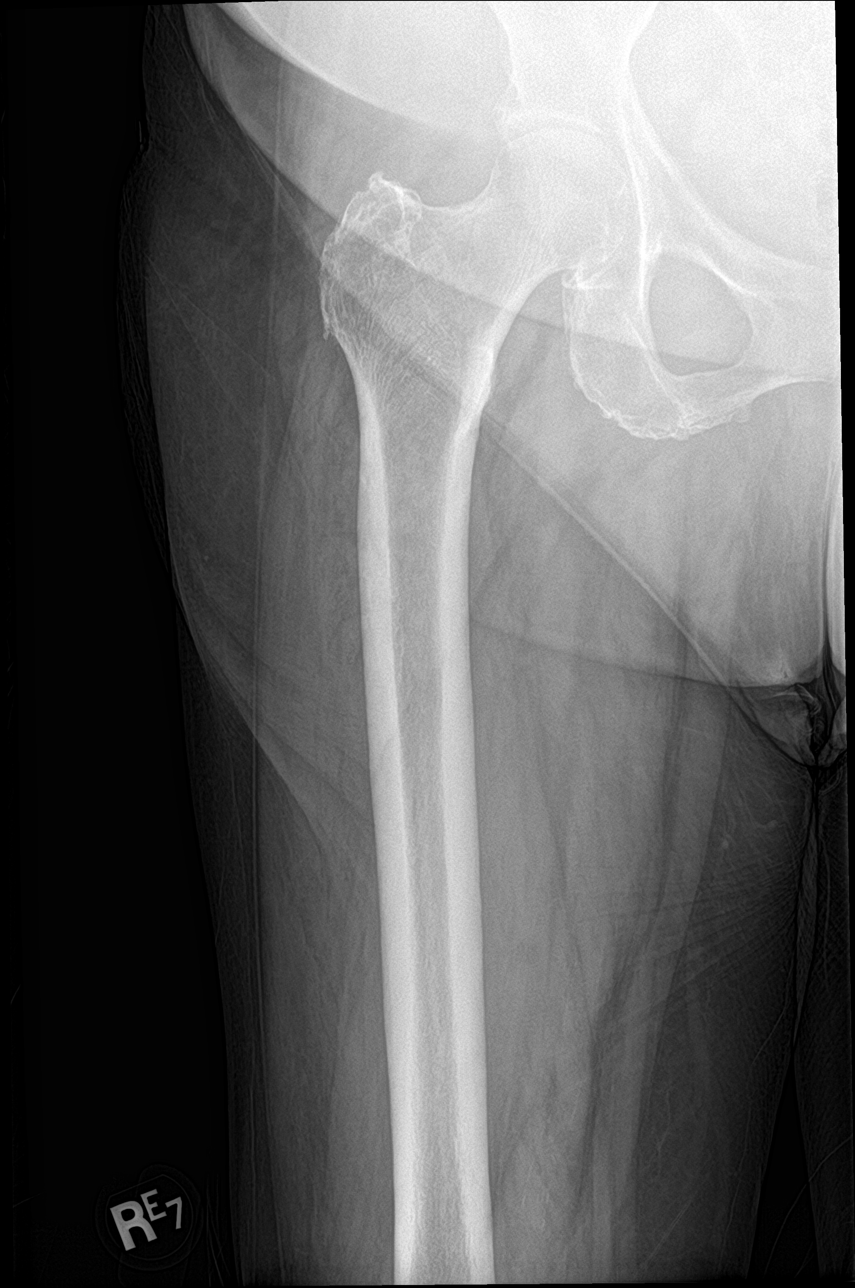

[femur ap (2 of 2)]
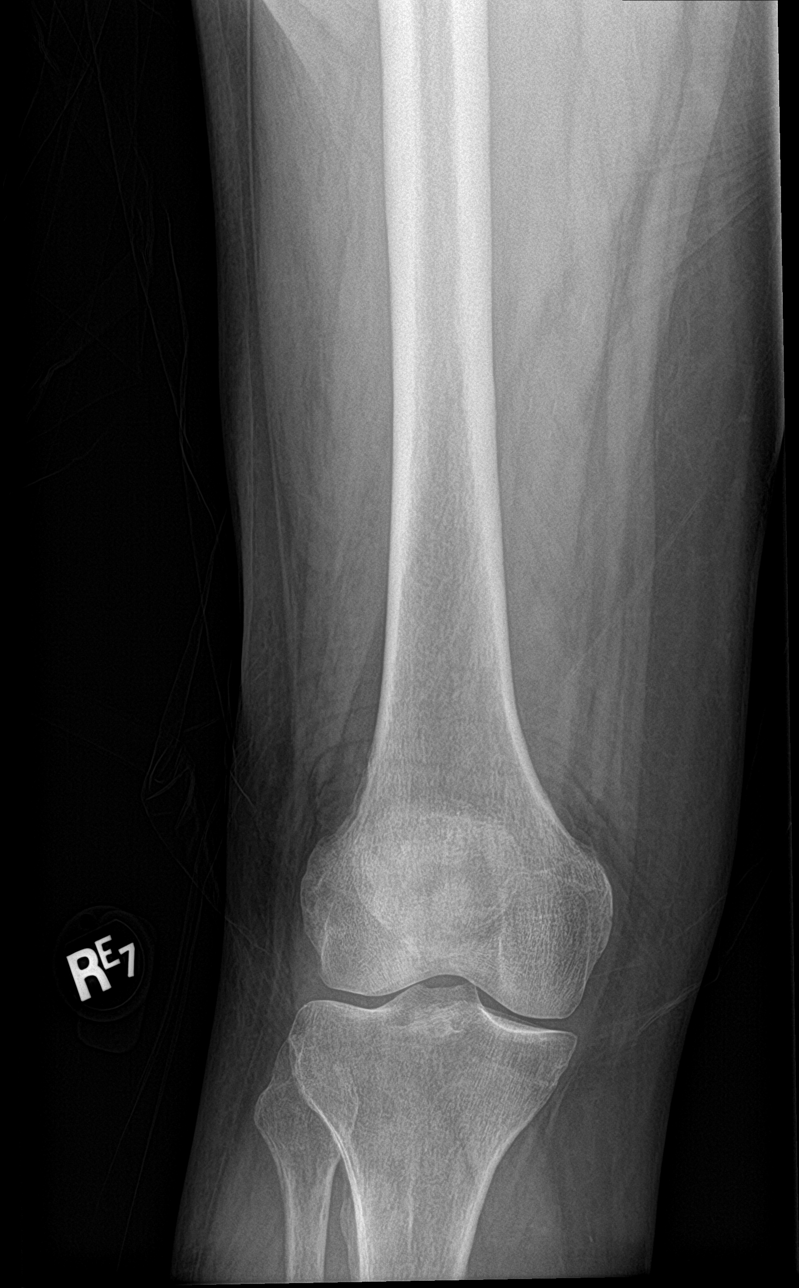

[femur lat (1 of 2)]
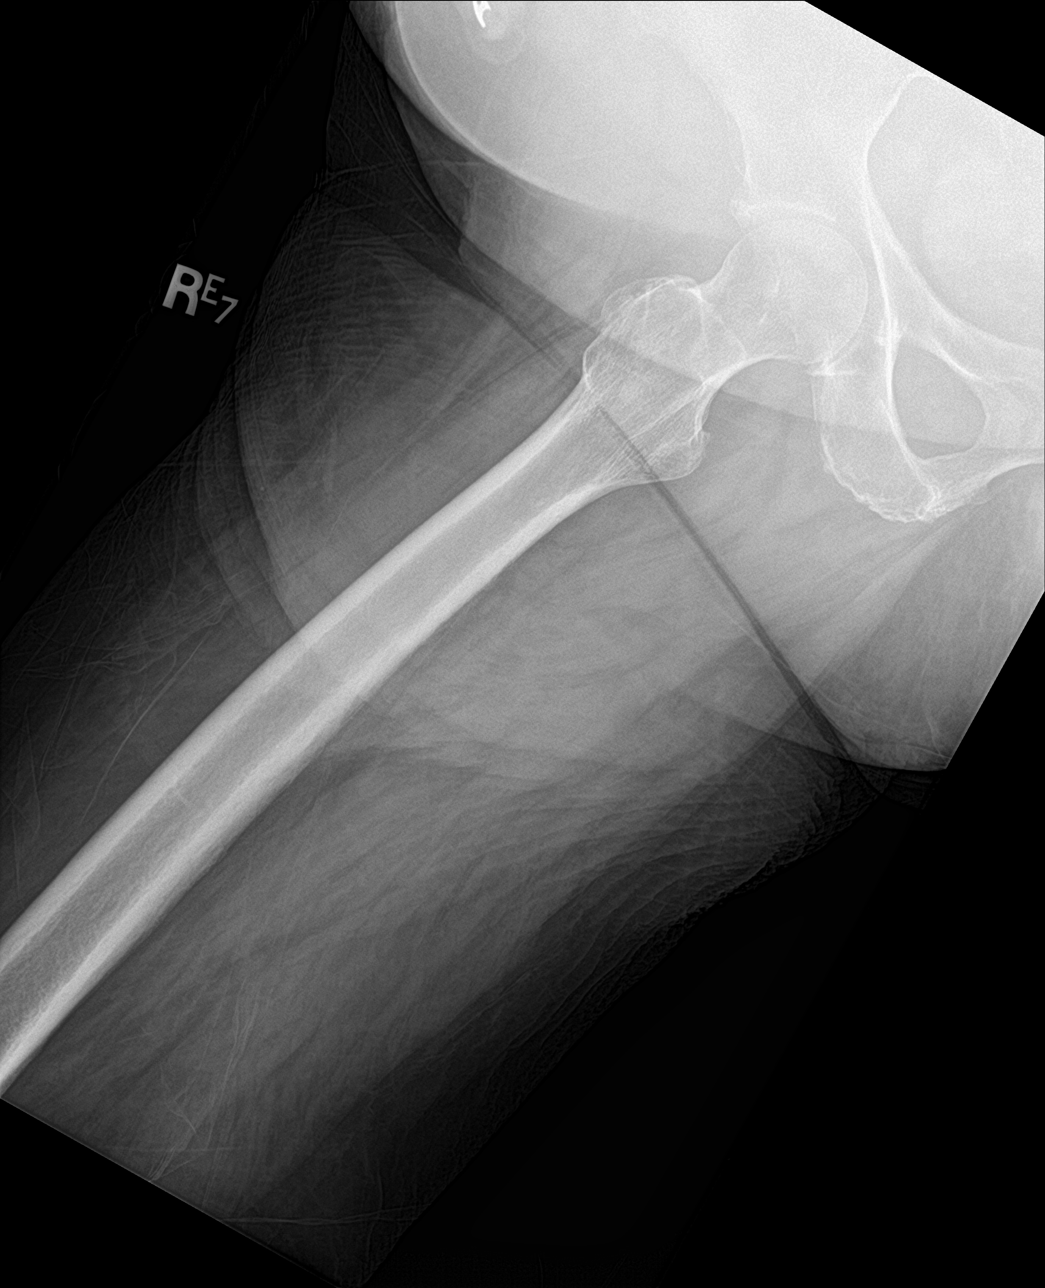

[femur lat (2 of 2)]
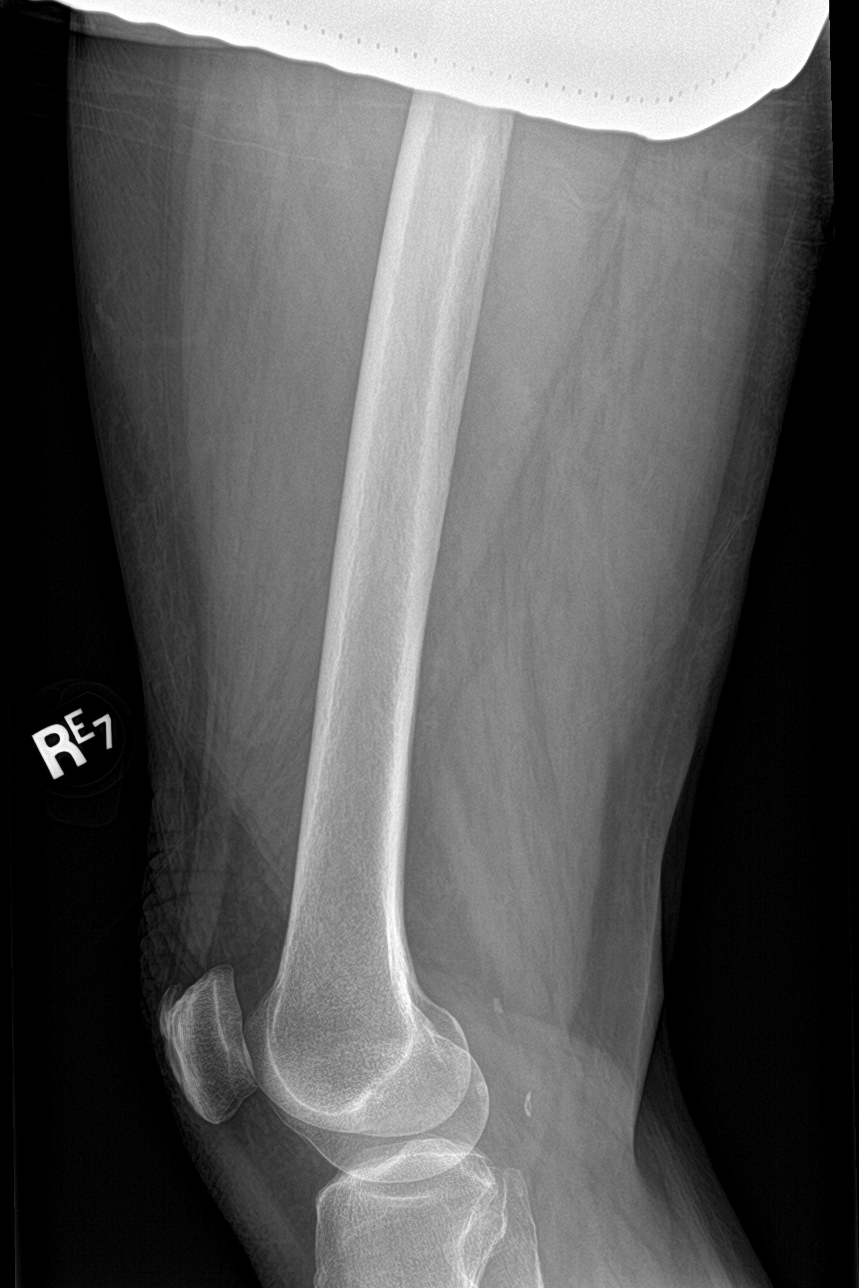

[4 of 4 positions shown; findings below may reference images not displayed]

FINDINGS: Right femur: Frontal and lateral views of the right femur are
obtained. No acute fractures. Mild right hip osteoarthritis.
Alignment is anatomic. Soft tissues are grossly normal.

Right knee: Frontal, bilateral oblique, lateral views of the right
knee are obtained. No fracture, subluxation, or dislocation. Joint
spaces are well preserved. Enthesopathic changes most pronounced at
the upper pole of the patella. No joint effusion. Soft tissues are
unremarkable.
IMPRESSION: 1. Mild right hip osteoarthritis.
2. No acute fracture involving the right femur or right knee.

## 2021-03-30 IMAGING — DX DG PELVIS 1-2V
1 series · 1 of 1 positions shown · non-contrast
Comparison: None.

CLINICAL DATA: Right lateral femoral pain.

EXAM:
PELVIS - 1-2 VIEW

[pelvis ap]
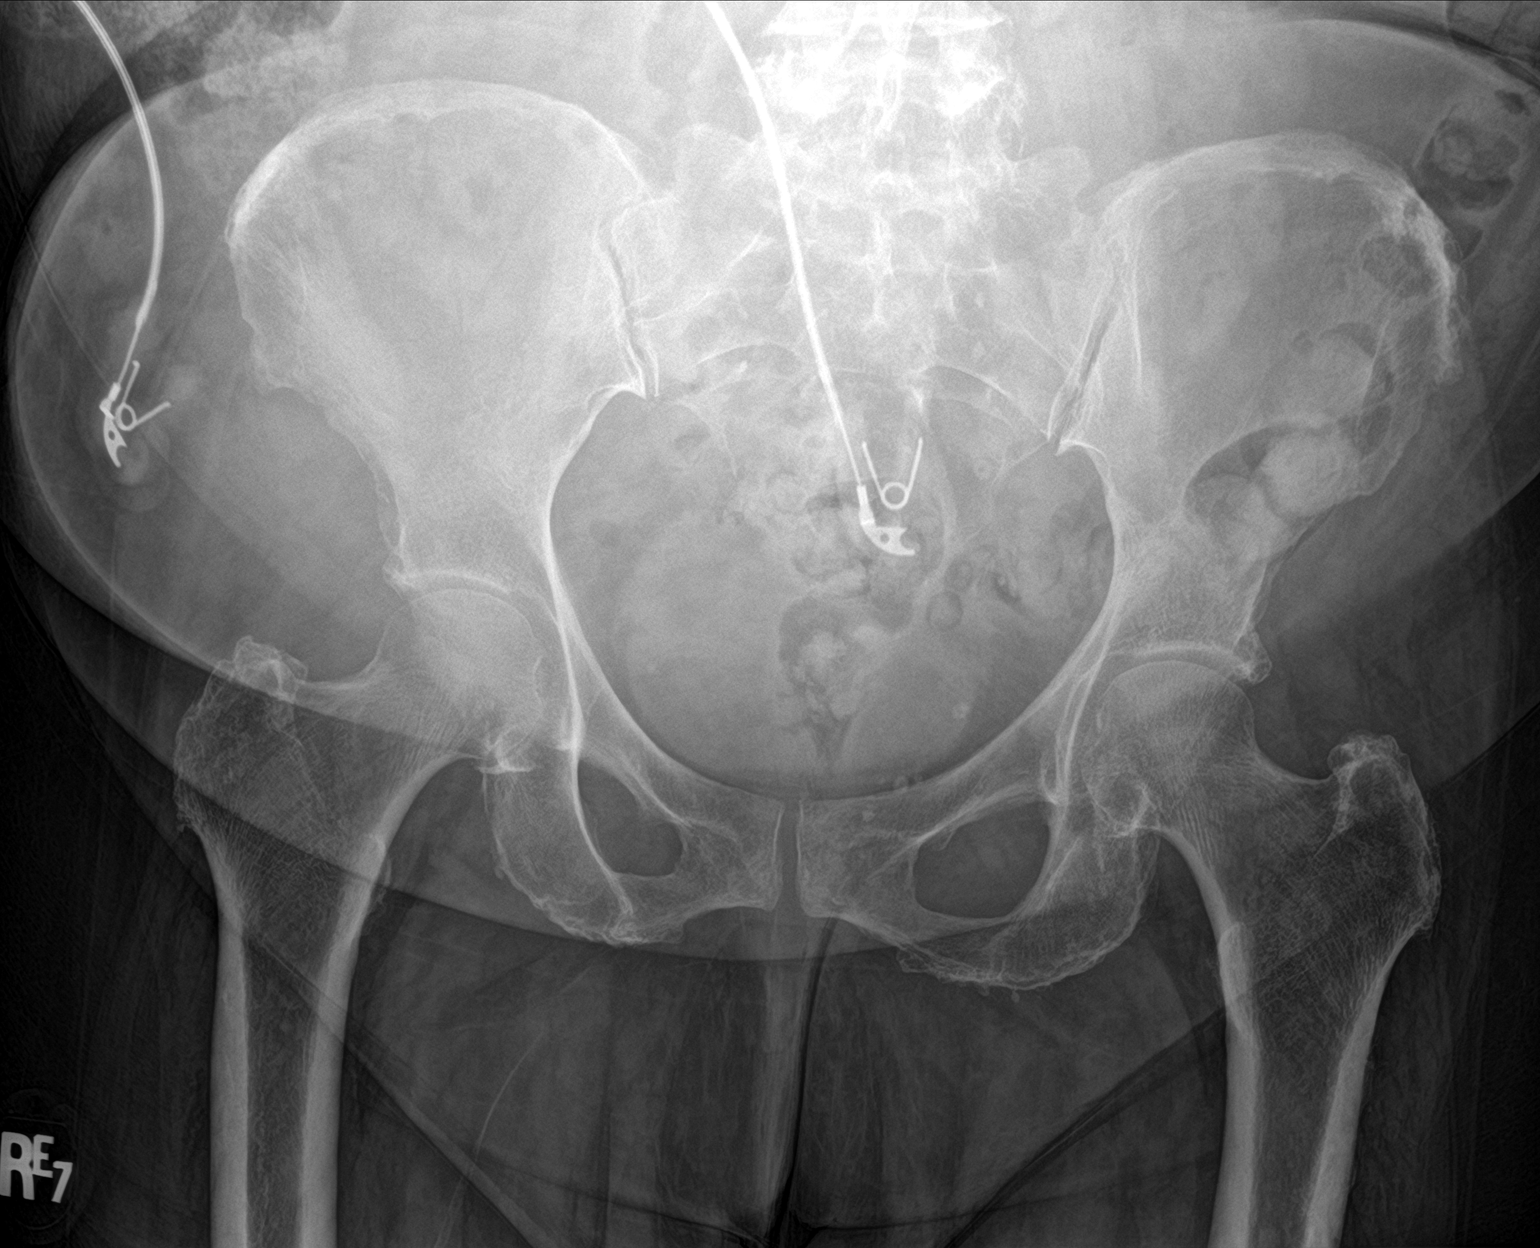

[1 of 1 positions shown; findings below may reference images not displayed]

FINDINGS: There is no evidence of pelvic fracture or diastasis. No pelvic bone
lesions are seen. Mild to moderate osteoarthritic changes of the
bilateral hip joints.
IMPRESSION: 1. No acute fracture or dislocation identified about the pelvis.
2. Mild to moderate osteoarthritic changes of the bilateral hip
joints.

## 2021-03-30 MED ORDER — ACETAMINOPHEN 325 MG PO TABS
650.0000 mg | ORAL_TABLET | Freq: Once | ORAL | Status: AC
  Administered 2021-03-30: 650 mg via ORAL
  Filled 2021-03-30: qty 2

## 2021-03-30 MED ORDER — DICLOFENAC SODIUM 1 % EX GEL: 2.0000 g | Freq: Four times a day (QID) | CUTANEOUS | 0 refills | Status: AC

## 2021-03-30 NOTE — Discharge Instructions (Signed)
You may alternate taking Tylenol as needed for pain control. You may take [phone] mg of Tylenol every 6 hours. Do not exceed 4000 mg of Tylenol daily as this can lead to liver damage. You may use warm and cold compresses to help with your symptoms.   Use the voltaren gel as directed for your pain.   Please follow up with your primary care provider or with your orthopedic doctor within 5-7 days for re-evaluation of your symptoms. Please return to the emergency department for any new or worsening symptoms.

## 2021-03-30 NOTE — ED Triage Notes (Signed)
Pt c/o right thigh pain intermittently x 3 weeks; pt reports pain is worse with coughing

## 2021-03-30 NOTE — ED Notes (Signed)
Ambulated around entire nurses station with assistant.  Rates pain 4/10 at times but 0/10 with little or no movement.

## 2021-03-30 NOTE — ED Provider Notes (Signed)
[facility] EMERGENCY DEPARTMENT Provider Note   CSN: [id number] Arrival date & time: 03/30/21  1309     History Chief Complaint  Patient presents with  . Leg Pain    [name] [name] is a 80 y.o. female.  HPI   80 year old female with history of aortic stenosis, COPD, GERD, hematuria, nephrolithiasis, hypertension, hyperlipidemia, left ureteral stone, OSA, seasonal allergies, diabetes, who presents to the emergency department today for evaluation of right leg pain.  She states that for the last 3 weeks she has had intermittent pain to the right leg.  She has pain in the hip and in the right knee.  The pain radiates up from the knee to the anterior and lateral leg.  It feels a shooting pain.  Sometimes the pain is worse when she coughs.  She tried a heating pad which improved her symptoms temporarily.  Past Medical History:  Diagnosis Date  . Aortic valve stenosis, mild    per echo 02-13-2016  possible bicuspid w/ moderate thickened and calicified ,  valve area 1.34cm^2    pt denies  . COPD (chronic obstructive pulmonary disease) ([place])   . GERD (gastroesophageal reflux disease)    no problems since started on CPAP  . Hematuria   . History of adenomatous polyp of colon    tubular adnenoma's and hyperplastic polyp's 05-19-2002;  08-15-2010;  2015  . History of kidney stones    multiple since 1970's  . History of squamous cell carcinoma excision    05-14-2012  right arm;  04-18-2015  left ankle  . HTN (hypertension)   . Hyperlipidemia   . Iron deficiency anemia   . Left ureteral stone   . OSA on CPAP    per study 10-13-2006 mild to moderate osa w/ hypersomnia  . Seasonal and perennial allergic rhinitis   . Type 2 diabetes mellitus ([facility])    type 2  . Urgency of urination     Patient Active Problem List   Diagnosis Date Noted  . Multiple lung nodules on CT 09/20/2019  . COPD mixed type ([place]) 09/16/2018  . Coronary artery calcification seen on computed tomography  06/30/2018  . Precordial pain 06/30/2018  . Moderate aortic stenosis by prior echocardiogram 06/30/2018  . Anemia 01/17/2017  . Depression 01/17/2017  . Thyroid nodule 01/17/2017  . Kidney stone 01/17/2017  . Osteopenia 01/17/2017  . Hyperlipidemia associated with type 2 diabetes mellitus ([place]) 01/17/2017  . Leukocytosis 01/17/2017  . Adenomatous colon polyp 01/17/2017  . Dyspnea 07/20/2011  . DIABETES, TYPE 2 07/19/2010  . Essential hypertension 07/19/2010  . Seasonal and perennial allergic rhinitis 07/19/2010  . SIALADENITIS, RIGHT 07/19/2010  . G E R D 07/19/2010  . Obstructive sleep apnea 06/06/2010    Past Surgical History:  Procedure Laterality Date  . BIOPSY  03/29/2020   Procedure: BIOPSY;  Surgeon: Clarene Essex, MD;  Location: [facility];  Service: Endoscopy;;  . CARPAL TUNNEL RELEASE Bilateral 2003  . COLONOSCOPY  last one 2015  . CT CHEST WITH CONTRAST   ([place] HX)     Advanced aortic and branch vessel atherosclerosis. Tortuous thoracic aorta. Normal heart size, without pericardial effusion. Multivessel coronary artery atherosclerosis  . CYSTOSCOPY WITH RETROGRADE PYELOGRAM, URETEROSCOPY AND STENT PLACEMENT Left 04/15/2017   Procedure: CYSTOSCOPY WITH LEFT  RETROGRADE PYELOGRAM, URETEROSCOPYwith stone basketry;  Surgeon: Kathie Rhodes, MD;  Location: [facility];  Service: Urology;  Laterality: Left;  . D & C HYSTEROSCOPY W/ RESECTION FIBROID AND POLYP  07/30/2000  .  DOBUTAMINE STRESS ECHO  01-30-2008   [facility]   no ischemia/  normal wall motion/  post stress ef 79%  . ESOPHAGOGASTRODUODENOSCOPY (EGD) WITH PROPOFOL N/A 01/28/2018   Procedure: ESOPHAGOGASTRODUODENOSCOPY (EGD) WITH PROPOFOL;  Surgeon: Clarene Essex, MD;  Location: [facility];  Service: Endoscopy;  Laterality: N/A;  . ESOPHAGOGASTRODUODENOSCOPY (EGD) WITH PROPOFOL N/A 08/19/2019   Procedure: ESOPHAGOGASTRODUODENOSCOPY (EGD) WITH PROPOFOL;  Surgeon: Clarene Essex, MD;  Location: [facility];  Service:  Endoscopy;  Laterality: N/A;  . ESOPHAGOGASTRODUODENOSCOPY (EGD) WITH PROPOFOL N/A 03/29/2020   Procedure: ESOPHAGOGASTRODUODENOSCOPY (EGD) WITH PROPOFOL;  Surgeon: Clarene Essex, MD;  Location: [facility];  Service: Endoscopy;  Laterality: N/A;  . [place]   trauma   . HEMOSTASIS CLIP PLACEMENT  08/19/2019   Procedure: HEMOSTASIS CLIP PLACEMENT;  Surgeon: Clarene Essex, MD;  Location: [facility];  Service: Endoscopy;;  . HOT HEMOSTASIS N/A 01/28/2018   Procedure: HOT HEMOSTASIS (ARGON PLASMA COAGULATION/BICAP);  Surgeon: Clarene Essex, MD;  Location: [facility];  Service: Endoscopy;  Laterality: N/A;  . LAPAROSCOPIC CHOLECYSTECTOMY  08/20/2001   w/ ERCP spincterotomy w/ balloon  . NM MYOVIEW LTD  7/'19; 6/'21   LOW RISK.  EF > 65%. No Ischemia or Infarction. Normal Study: Hyperdynamic.  Marland Kitchen POLYPECTOMY  08/19/2019   Procedure: POLYPECTOMY;  Surgeon: Clarene Essex, MD;  Location: [facility];  Service: Endoscopy;;  . TRANSTHORACIC ECHOCARDIOGRAM  02/13/2016   LVEF 55-60%. Gr 1 DD. ? possible bicuspid AoV w/ moderate thickened / calcified AoV w/ mild Stenosis (Mean Gradietn 15 mmHg, Valve area 1.34cm^2), no regurg.    . TRANSTHORACIC ECHOCARDIOGRAM  06/2018   EF 55-65%. Gr 1 DD. Mild AS (? Functionally bicuspid AoV with moderate leaflet thickening) - Mean Gradient 15 mmHg, Peak 32 mmHg  . TUBAL LIGATION Bilateral yrs ago     OB History   No obstetric history on file.     Family History  Problem Relation Age of Onset  . Alzheimer's disease Mother   . Colon cancer Paternal Grandfather   . Heart attack Paternal Grandfather   . Brain cancer Paternal Grandmother   . Heart disease Paternal Grandmother        not sure of details  . Heart disease Maternal Grandmother        pacemaker, heart attack  . Alzheimer's disease Maternal Grandmother   . Colon cancer Paternal Uncle   . Colon cancer Paternal Uncle   . Colon cancer Paternal Aunt   . Heart attack Maternal Uncle   .  Lung cancer Maternal Uncle   . Hyperlipidemia Brother   . Hypertension Brother     Social History   Tobacco Use  . Smoking status: Former Smoker    Packs/day: 0.25    Years: 50.00    Pack years: 12.50    Types: Cigarettes    Quit date: 12/01/2015    Years since quitting: 5.3  . Smokeless tobacco: Never Used  Vaping Use  . Vaping Use: Never used  Substance Use Topics  . Alcohol use: Yes    Alcohol/week: 0.0 standard drinks    Comment: rare  . Drug use: No    Home Medications Prior to Admission medications   Medication Sig Start Date End Date Taking? Authorizing Provider  diclofenac Sodium (VOLTAREN) 1 % GEL Apply 2 g topically 4 (four) times daily. 03/30/21  Yes Lilia Letterman S, PA-C  albuterol (VENTOLIN HFA) 108 (90 Base) MCG/ACT inhaler Inhale 2 puffs into the lungs every 6 (six) hours as needed  for wheezing or shortness of breath.    [provider]  ANORO ELLIPTA 62.5-25 MCG/INH AEPB Inhale 1 puff into the lungs daily. 09/30/20   [provider]  aspirin EC 81 MG tablet Take 81 mg by mouth at bedtime.    [provider]  Carboxymethylcellul-Glycerin (LUBRICATING EYE DROPS OP) Place 1 drop into both eyes 3 (three) times daily as needed (dry eyes).     [provider]  Cholecalciferol (VITAMIN D-3) 125 MCG (5000 UT) TABS Take 5,000 Units by mouth See admin instructions. Take 1 tablet (5000 units) by mouth every evening, except Fridays.    [provider]  diltiazem (CARDIZEM CD) 240 MG 24 hr capsule Take 240 mg by mouth daily.    [provider]  escitalopram (LEXAPRO) 10 MG tablet Take 10 mg by mouth every morning.     [provider]  ferrous sulfate 325 (65 FE) MG tablet Take 325 mg by mouth daily with breakfast.    [provider]  fluticasone (FLONASE) 50 MCG/ACT nasal spray Place 2 sprays into both nostrils daily.    [provider]  icosapent Ethyl (VASCEPA) 1 g capsule Take 2 capsules by  mouth 2 (two) times daily.     [provider]  insulin aspart (NOVOLOG FLEXPEN) 100 UNIT/ML FlexPen Inject 8 Units into the skin 3 (three) times daily with meals.    [provider]  insulin detemir (LEVEMIR) 100 UNIT/ML injection Inject 40 Units into the skin daily.     [provider]  losartan (COZAAR) 100 MG tablet Take 1 tablet (100 mg total) by mouth daily. 07/25/20   Leonie Man, MD  rosuvastatin (CRESTOR) 10 MG tablet Take 10 mg by mouth at bedtime. 01/26/21   [provider]  traMADol (ULTRAM) 50 MG tablet Take 50 mg by mouth every 6 (six) hours as needed.    [provider]  TRULICITY 1.5 VQ/0.0QQ SOPN Inject 1.5 mg into the skin every Wednesday.  01/20/20   [provider]  vitamin B-12 (CYANOCOBALAMIN) 500 MCG tablet Take 500 mcg by mouth daily.    [provider]  Vitamin D, Ergocalciferol, (DRISDOL) 50000 units CAPS capsule Take 50,000 Units by mouth every Friday.     [provider]  zinc gluconate 50 MG tablet Take 50 mg by mouth daily.    [provider]    Allergies    Metformin and related  Review of Systems   Review of Systems  Constitutional: Negative for chills and fever.  HENT: Negative for ear pain and sore throat.   Eyes: Negative for pain and visual disturbance.  Respiratory: Negative for cough and shortness of breath.   Cardiovascular: Negative for chest pain.  Gastrointestinal: Negative for abdominal pain, constipation, diarrhea, nausea and vomiting.  Genitourinary: Negative for dysuria and hematuria.  Musculoskeletal: Negative for back pain.       Right leg pain  Skin: Negative for rash.  Neurological: Negative for weakness and numbness.  All other systems reviewed and are negative.   Physical Exam Updated Vital Signs BP (!) 105/54   Pulse 63   Temp 98.1 F (36.7 C) (Oral)   Resp 18   Ht 5\' 2"  (1.575 m)   Wt 82.3 kg   SpO2 98%   BMI 33.18 kg/m   Physical  Exam Vitals and nursing note reviewed.  Constitutional:      General: She is not in acute distress.    Appearance: She is well-developed.  HENT:     Head: Normocephalic and atraumatic.  Eyes:     Conjunctiva/sclera: Conjunctivae normal.  Cardiovascular:     Rate and Rhythm: Normal rate.  Pulmonary:     Effort: Pulmonary effort is normal.  Musculoskeletal:        General: Normal range of motion.     Cervical back: Neck supple.     Comments: TTP along the right IT band, right distal quadriceps and quadriceps tendon. Mild TTP along the right lateral knee and right hip. DP pulses 2+ and symmetric. No asymmetric BLE edema or calf TTP.   Skin:    General: Skin is warm and dry.  Neurological:     Mental Status: She is alert.     ED Results / Procedures / Treatments   Labs (all labs ordered are listed, but only abnormal results are displayed) Labs Reviewed  CBG MONITORING, ED - Abnormal; Notable for the following components:      Result Value   Glucose-Capillary 151 (*)    All other components within normal limits    EKG None  Radiology DG Pelvis 1-2 Views  Result Date: 03/30/2021 CLINICAL DATA:  Right lateral femoral pain. EXAM: PELVIS - 1-2 VIEW COMPARISON:  None. FINDINGS: There is no evidence of pelvic fracture or diastasis. No pelvic bone lesions are seen. Mild to moderate osteoarthritic changes of the bilateral hip joints. IMPRESSION: 1. No acute fracture or dislocation identified about the pelvis. 2. Mild to moderate osteoarthritic changes of the bilateral hip joints. Electronically Signed   By: Fidela Salisbury M.D.   On: 03/30/2021 17:15   DG Knee Complete 4 Views Right  Result Date: 03/30/2021 CLINICAL DATA:  Pain in lateral aspect of right femur and knee, symptoms for several months EXAM: RIGHT KNEE - COMPLETE 4+ VIEW; RIGHT FEMUR 2 VIEWS COMPARISON:  None. FINDINGS: Right femur: Frontal and lateral views of the right femur are obtained. No acute fractures. Mild  right hip osteoarthritis. Alignment is anatomic. Soft tissues are grossly normal. Right knee: Frontal, bilateral oblique, lateral views of the right knee are obtained. No fracture, subluxation, or dislocation. Joint spaces are well preserved. Enthesopathic changes most pronounced at the upper pole of the patella. No joint effusion. Soft tissues are unremarkable. IMPRESSION: 1. Mild right hip osteoarthritis. 2. No acute fracture involving the right femur or right knee. Electronically Signed   By: Randa Ngo M.D.   On: 03/30/2021 17:17   DG Femur Min 2 Views Right  Result Date: 03/30/2021 CLINICAL DATA:  Pain in lateral aspect of right femur and knee, symptoms for several months EXAM: RIGHT KNEE - COMPLETE 4+ VIEW; RIGHT FEMUR 2 VIEWS COMPARISON:  None. FINDINGS: Right femur: Frontal and lateral views of the right femur are obtained. No acute fractures. Mild right hip osteoarthritis. Alignment is anatomic. Soft tissues are grossly normal. Right knee: Frontal, bilateral oblique, lateral views of the right knee are obtained. No fracture, subluxation, or dislocation. Joint spaces are well preserved. Enthesopathic changes most pronounced at the upper pole of the patella. No joint effusion. Soft tissues are unremarkable. IMPRESSION: 1. Mild right hip osteoarthritis. 2. No acute fracture involving the right femur or right knee. Electronically Signed   By: Randa Ngo M.D.   On: 03/30/2021 17:17    Procedures Procedures   Medications Ordered in ED Medications  acetaminophen (TYLENOL) tablet 650 mg (650 mg Oral Given 03/30/21 1629)    ED Course  I have reviewed the triage vital signs and the nursing notes.  Pertinent labs & imaging results that were available during my care of the patient were reviewed by me and considered in my medical decision making (see chart for details).    MDM Rules/Calculators/A&P                         80 year old female presenting the emergency department today  complaining of pain to the right leg.  On exam has tenderness along the right IT band, distal quadriceps muscle and quadriceps tendon.  She also has some mild tenderness to the right knee and hip.  Reviewed/interpreted imaging Xray pelvis - 1. No acute fracture or dislocation identified about the pelvis. 2. Mild to moderate osteoarthritic changes of the bilateral hip joints. Xray right femur - 1. Mild right hip osteoarthritis. 2. No acute fracture involving the right femur or right knee. Xray right knee - 1. Mild right hip osteoarthritis. 2. No acute fracture involving the right femur or right knee.  This is likely MSK in nature.  I have low suspicion for vascular pathology or other emergent causes of symptoms.  She is able to ambulate here in the ED today without difficulty.  Will give Rx for Voltaren gel, advised warm compresses and have her follow-up with her orthopedic doctor.  Have advised on plan for follow-up and strict return precautions.  She voices understanding of the plan and reasons to return.  All questions answered.  Patient stable for discharge.  Final Clinical Impression(s) / ED Diagnoses Final diagnoses:  Right leg pain    Rx / DC Orders ED Discharge Orders         Ordered    diclofenac Sodium (VOLTAREN) 1 % GEL  4 times daily        03/30/21 [address], PA-C 03/30/21 1749    Milton Ferguson, MD 03/30/21 2242

## 2021-04-10 DIAGNOSIS — M5459 Other low back pain: Secondary | ICD-10-CM | POA: Diagnosis not present

## 2021-04-20 DIAGNOSIS — L308 Other specified dermatitis: Secondary | ICD-10-CM | POA: Diagnosis not present

## 2021-04-25 DIAGNOSIS — Z23 Encounter for immunization: Secondary | ICD-10-CM | POA: Diagnosis not present

## 2021-04-28 DIAGNOSIS — E1129 Type 2 diabetes mellitus with other diabetic kidney complication: Secondary | ICD-10-CM | POA: Diagnosis not present

## 2021-04-28 DIAGNOSIS — M5416 Radiculopathy, lumbar region: Secondary | ICD-10-CM | POA: Diagnosis not present

## 2021-04-28 DIAGNOSIS — E1149 Type 2 diabetes mellitus with other diabetic neurological complication: Secondary | ICD-10-CM | POA: Diagnosis not present

## 2021-05-04 ENCOUNTER — Other Ambulatory Visit (HOSPITAL_COMMUNITY): Payer: Self-pay | Admitting: Internal Medicine

## 2021-05-04 ENCOUNTER — Other Ambulatory Visit: Payer: Self-pay | Admitting: Internal Medicine

## 2021-05-04 DIAGNOSIS — M5416 Radiculopathy, lumbar region: Secondary | ICD-10-CM

## 2021-05-05 ENCOUNTER — Ambulatory Visit (HOSPITAL_COMMUNITY)
Admission: RE | Admit: 2021-05-05 | Discharge: 2021-05-05 | Disposition: A | Discharge status: Home / Self Care | Payer: Medicare Other | Source: Ambulatory Visit | Attending: Internal Medicine | Admitting: Internal Medicine

## 2021-05-05 ENCOUNTER — Other Ambulatory Visit: Payer: Self-pay

## 2021-05-05 DIAGNOSIS — M5416 Radiculopathy, lumbar region: Secondary | ICD-10-CM | POA: Insufficient documentation

## 2021-05-05 DIAGNOSIS — M545 Low back pain, unspecified: Secondary | ICD-10-CM | POA: Diagnosis not present

## 2021-05-05 IMAGING — MR MR LUMBAR SPINE W/O CM
5 series · 31 of 48 positions shown · non-contrast
Comparison: [DATE]

CLINICAL DATA: Low back pain and lower extremity weakness

EXAM:
MRI LUMBAR SPINE WITHOUT CONTRAST
TECHNIQUE: Multiplanar, multisequence MR imaging of the lumbar spine was
performed. No intravenous contrast was administered.

[Series 5: T1 · sagittal · 4.0mm · 0.81mm/px · 6 of 15 slices shown (1 of 2)]
[im 1/15]
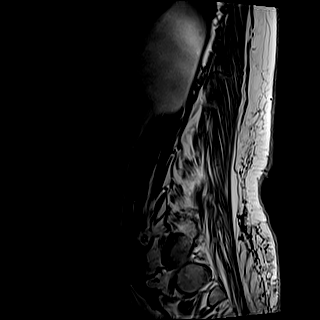
[im 3/15]
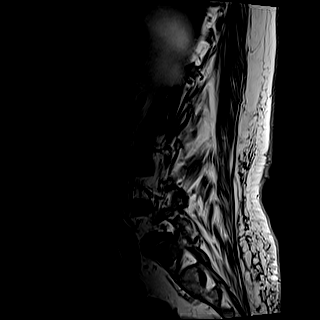
[im 6/15]
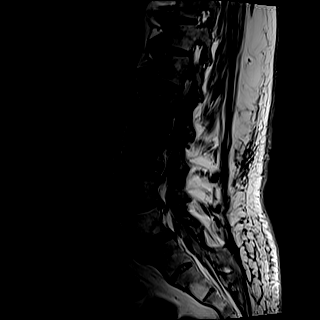
[im 9/15]
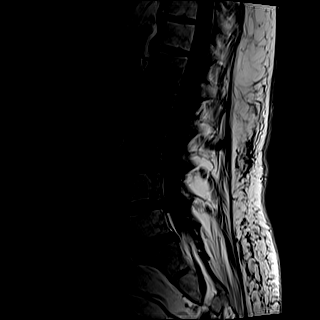
[im 12/15]
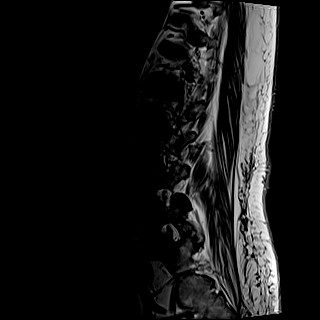
[im 15/15]
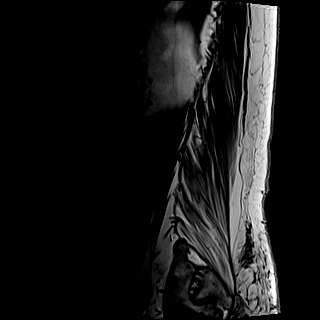

[Series 6: T2 · sagittal · 4.0mm · 0.81mm/px · 6 of 15 slices shown (1 of 2)]
[im 1/15]
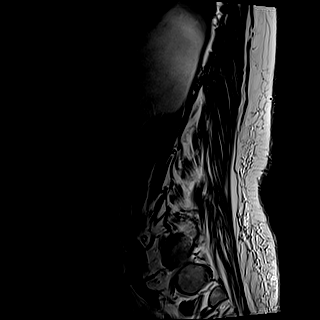
[im 3/15]
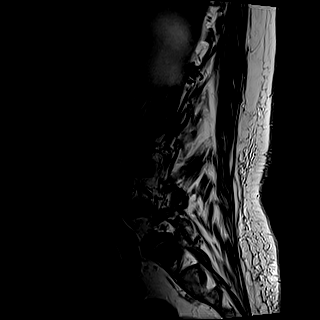
[im 6/15]
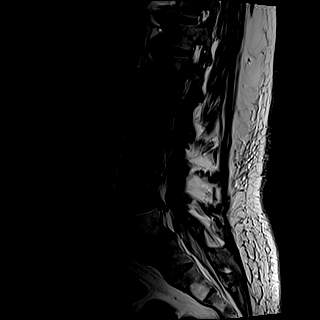
[im 9/15]
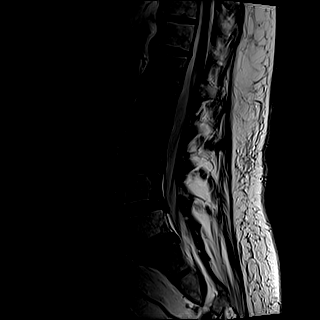
[im 12/15]
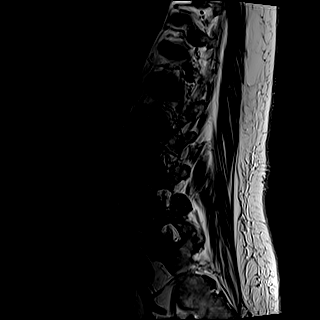
[im 15/15]
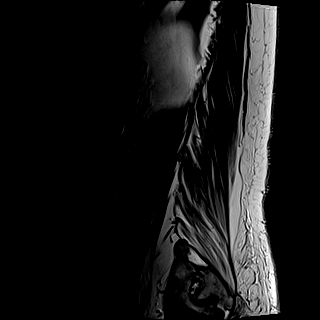

[Series 7: STIR · sagittal · 4.0mm · 0.51mm/px · 1 of 15 slices shown]
[im 1/15]
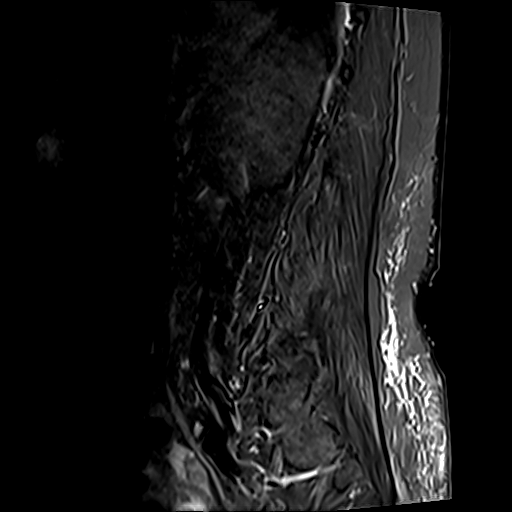

[Series 8: T2 · axial · 4.0mm · 0.62mm/px · z∈[-25,+190]mm · 9 of 37 slices shown (2 of 2)]
[im 1/37]
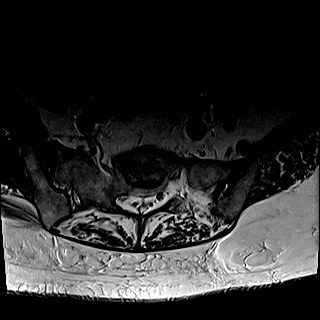
[im 6/37]
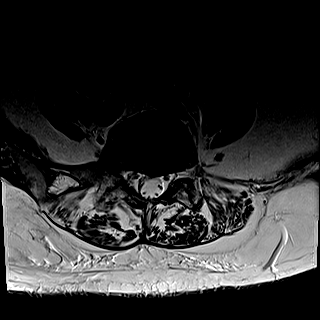
[im 11/37]
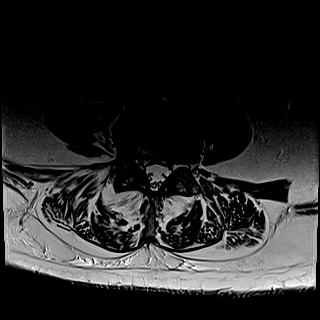
[im 16/37]
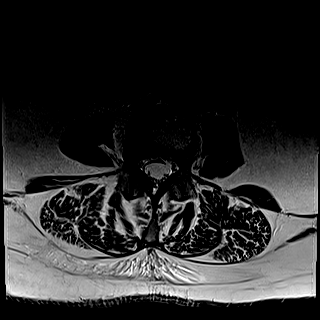
[im 19/37]
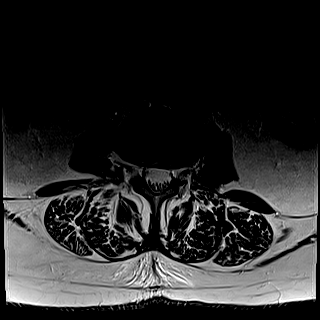
[im 21/37]
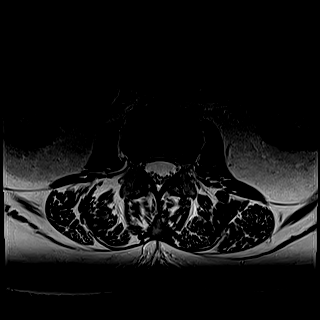
[im 26/37]
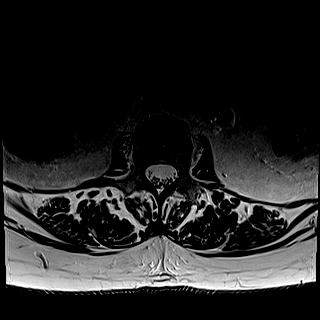
[im 31/37]
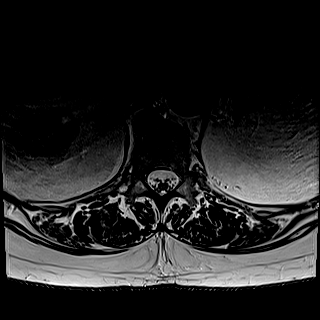
[im 37/37]
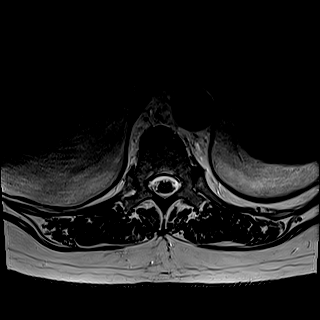

[Series 9: T1 · axial · 4.0mm · 0.39mm/px · z∈[-25,+190]mm · 9 of 37 slices shown (2 of 2)]
[im 1/37]
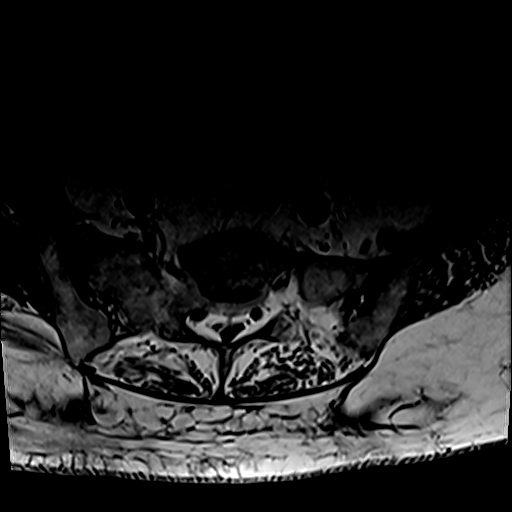
[im 6/37]
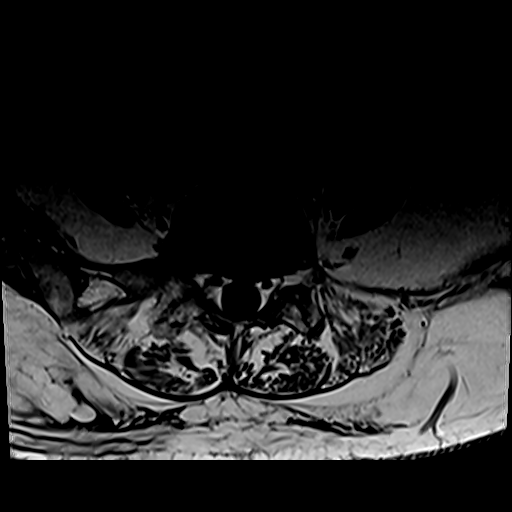
[im 11/37]
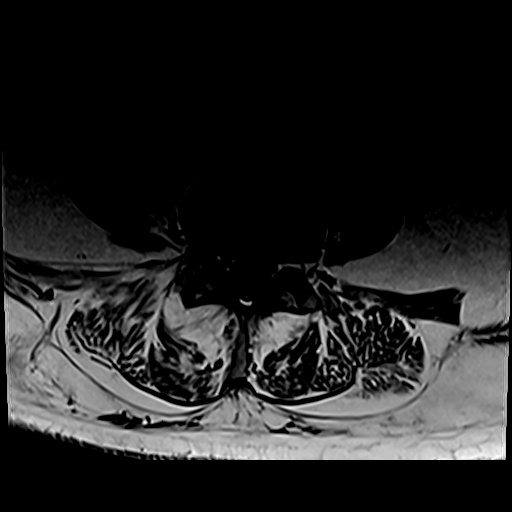
[im 16/37]
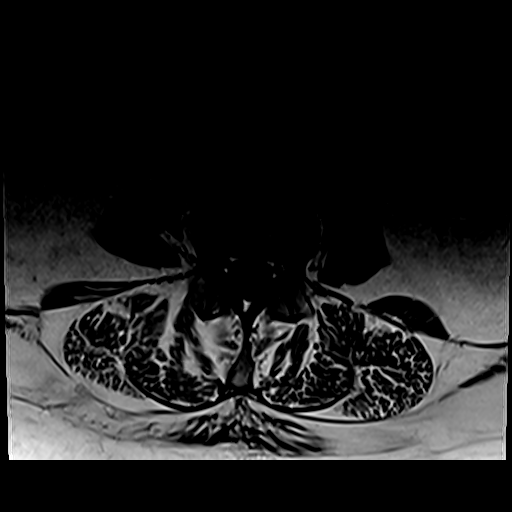
[im 19/37]
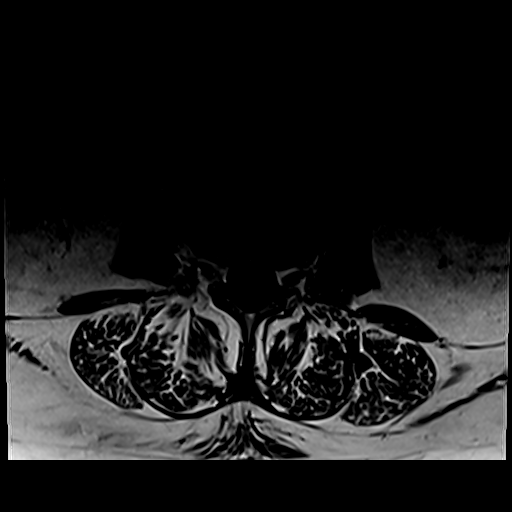
[im 21/37]
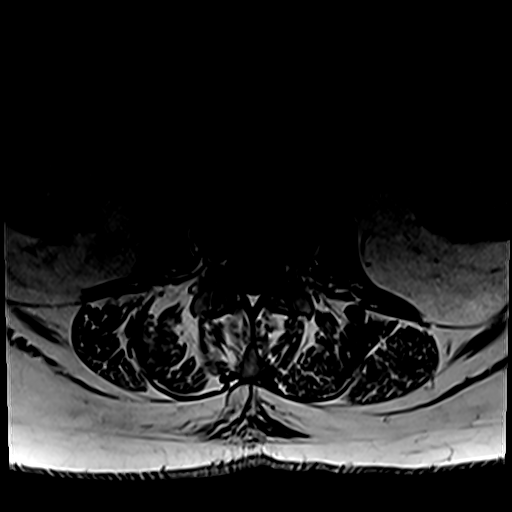
[im 26/37]
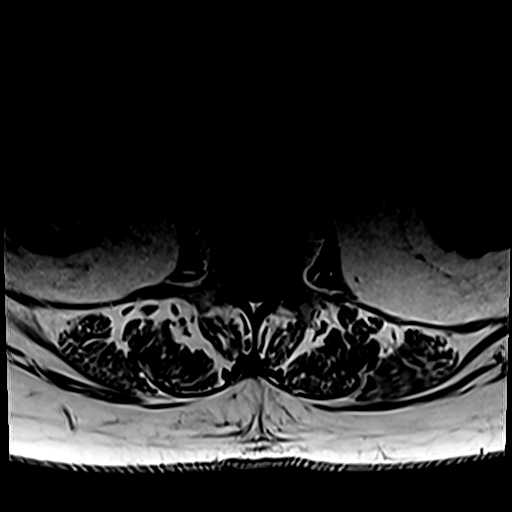
[im 31/37]
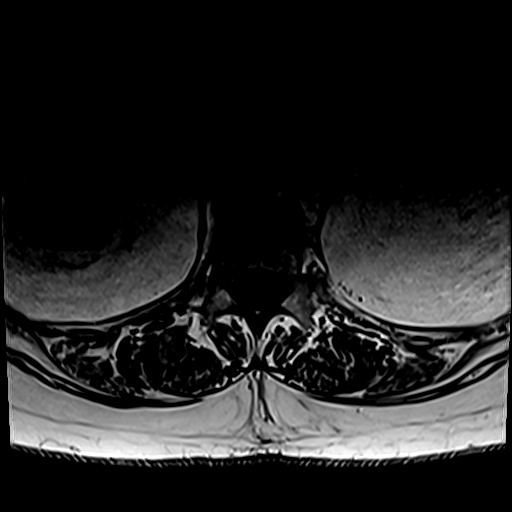
[im 37/37]
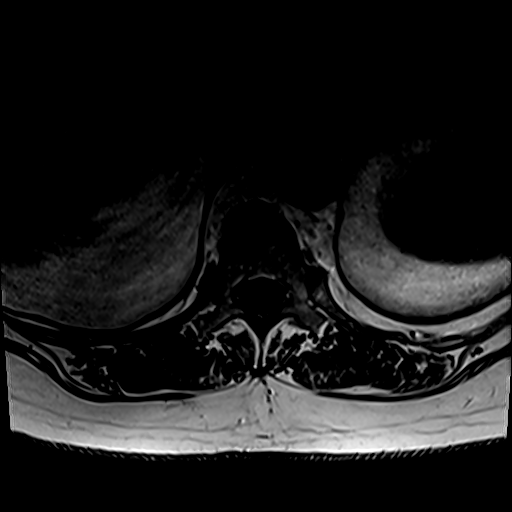

[31 of 48 positions shown; findings below may reference images not displayed]

FINDINGS: Segmentation: Transitional lumbosacral anatomy with sacralization of
the L5 segment. Lowest well developed disc space is designated as
L4-5. In correlation with CT chest [DATE], there are 12
rib-bearing thoracic type vertebral segments. Four non rib-bearing
lumbar type vertebral segments with the transitional level as seen
on CT abdomen pelvis [DATE].

Alignment:  4 mm grade 1 anterolisthesis L4 on L5.

Vertebrae: No fracture, evidence of discitis, or suspicious bone
lesion. Incidental note of multiple T1/T2 hyperintense intraosseous
hemangiomas, most notably within the L3, L4, and S1 segments.

Conus medullaris and cauda equina: Conus extends to the T12-L1
level. Conus and cauda equina appear normal.

Paraspinal and other soft tissues: None.

Disc levels:

T12-L1: No significant disc protrusion, foraminal stenosis, or canal
stenosis.

L1-L2: Minimal broad-based disc bulge. No foraminal or canal
stenosis.

L2-L3: Mild broad-based disc bulge with small left foraminal
protrusion. Mild bilateral facet hypertrophy. No foraminal or canal
stenosis.

L3-L4: Mild broad-based disc bulge and mild bilateral facet
hypertrophy. No foraminal or canal stenosis.

L4-L5: Grade 1 anterolisthesis with disc uncovering. Mild
broad-based disc bulge. Advanced bilateral facet arthropathy. No
foraminal or canal stenosis.

L5-S1: Transitional level.  No impingement.
IMPRESSION: 1. Transitional lumbosacral anatomy with sacralization of the L5
segment.
2. Mild multilevel lumbar spondylosis without foraminal or canal
stenosis at any level.

## 2021-05-17 DIAGNOSIS — E1149 Type 2 diabetes mellitus with other diabetic neurological complication: Secondary | ICD-10-CM | POA: Diagnosis not present

## 2021-05-17 DIAGNOSIS — Z794 Long term (current) use of insulin: Secondary | ICD-10-CM | POA: Diagnosis not present

## 2021-05-17 DIAGNOSIS — D649 Anemia, unspecified: Secondary | ICD-10-CM | POA: Diagnosis not present

## 2021-05-17 DIAGNOSIS — E669 Obesity, unspecified: Secondary | ICD-10-CM | POA: Diagnosis not present

## 2021-05-17 DIAGNOSIS — I129 Hypertensive chronic kidney disease with stage 1 through stage 4 chronic kidney disease, or unspecified chronic kidney disease: Secondary | ICD-10-CM | POA: Diagnosis not present

## 2021-05-17 DIAGNOSIS — J449 Chronic obstructive pulmonary disease, unspecified: Secondary | ICD-10-CM | POA: Diagnosis not present

## 2021-05-17 DIAGNOSIS — M5416 Radiculopathy, lumbar region: Secondary | ICD-10-CM | POA: Diagnosis not present

## 2021-05-17 DIAGNOSIS — E785 Hyperlipidemia, unspecified: Secondary | ICD-10-CM | POA: Diagnosis not present

## 2021-05-17 DIAGNOSIS — N184 Chronic kidney disease, stage 4 (severe): Secondary | ICD-10-CM | POA: Diagnosis not present

## 2021-05-17 DIAGNOSIS — D72829 Elevated white blood cell count, unspecified: Secondary | ICD-10-CM | POA: Diagnosis not present

## 2021-05-17 DIAGNOSIS — F3341 Major depressive disorder, recurrent, in partial remission: Secondary | ICD-10-CM | POA: Diagnosis not present

## 2021-05-17 DIAGNOSIS — E1129 Type 2 diabetes mellitus with other diabetic kidney complication: Secondary | ICD-10-CM | POA: Diagnosis not present

## 2021-06-01 ENCOUNTER — Ambulatory Visit: Payer: Medicare Other | Attending: Internal Medicine | Admitting: Physical Therapy

## 2021-06-01 ENCOUNTER — Encounter: Payer: Self-pay | Admitting: Physical Therapy

## 2021-06-01 ENCOUNTER — Other Ambulatory Visit: Payer: Self-pay

## 2021-06-01 DIAGNOSIS — G8929 Other chronic pain: Secondary | ICD-10-CM | POA: Diagnosis not present

## 2021-06-01 DIAGNOSIS — M5441 Lumbago with sciatica, right side: Secondary | ICD-10-CM | POA: Insufficient documentation

## 2021-06-01 DIAGNOSIS — R293 Abnormal posture: Secondary | ICD-10-CM | POA: Insufficient documentation

## 2021-06-01 NOTE — Therapy (Signed)
[facility] [place], [place], [address] Phone: [phone]   Fax:  [phone]  Physical Therapy Evaluation  Patient Details  Name: [name] [name] MRN: [id number] Date of Birth: 1941/02/24 Referring Provider (PT): Marton Redwood MD   Encounter Date: 06/01/2021   PT End of Session - 06/01/21 1317    Visit Number 1    Number of Visits 12    Date for PT Re-Evaluation 08/31/21    Authorization Type FOTO AT LEAST EVERY 5TH VISIT.  PROGRESS NOTE AT 10TH VISIT.  KX MODIFIER AFTER 15 VISITS.    PT Start Time 1215    PT Stop Time 1306    PT Time Calculation (min) 51 min    Activity Tolerance Patient tolerated treatment well    Behavior During Therapy WFL for tasks assessed/performed           Past Medical History:  Diagnosis Date  . Aortic valve stenosis, mild    per echo 02-13-2016  possible bicuspid w/ moderate thickened and calicified ,  valve area 1.34cm^2    pt denies  . COPD (chronic obstructive pulmonary disease) ([place])   . GERD (gastroesophageal reflux disease)    no problems since started on CPAP  . Hematuria   . History of adenomatous polyp of colon    tubular adnenoma's and hyperplastic polyp's 05-19-2002;  08-15-2010;  2015  . History of kidney stones    multiple since 1970's  . History of squamous cell carcinoma excision    05-14-2012  right arm;  04-18-2015  left ankle  . HTN (hypertension)   . Hyperlipidemia   . Iron deficiency anemia   . Left ureteral stone   . OSA on CPAP    per study 10-13-2006 mild to moderate osa w/ hypersomnia  . Seasonal and perennial allergic rhinitis   . Type 2 diabetes mellitus (HCC)    type 2  . Urgency of urination     Past Surgical History:  Procedure Laterality Date  . BIOPSY  03/29/2020   Procedure: BIOPSY;  Surgeon: Clarene Essex, MD;  Location: [facility];  Service: Endoscopy;;  . CARPAL TUNNEL RELEASE Bilateral 2003  . COLONOSCOPY  last one 2015  . CT CHEST WITH  CONTRAST   ([place] HX)     Advanced aortic and branch vessel atherosclerosis. Tortuous thoracic aorta. Normal heart size, without pericardial effusion. Multivessel coronary artery atherosclerosis  . CYSTOSCOPY WITH RETROGRADE PYELOGRAM, URETEROSCOPY AND STENT PLACEMENT Left 04/15/2017   Procedure: CYSTOSCOPY WITH LEFT  RETROGRADE PYELOGRAM, URETEROSCOPYwith stone basketry;  Surgeon: Kathie Rhodes, MD;  Location: [facility];  Service: Urology;  Laterality: Left;  . D & C HYSTEROSCOPY W/ RESECTION FIBROID AND POLYP  07/30/2000  . DOBUTAMINE STRESS ECHO  01-30-2008   [facility]   no ischemia/  normal wall motion/  post stress ef 79%  . ESOPHAGOGASTRODUODENOSCOPY (EGD) WITH PROPOFOL N/A 01/28/2018   Procedure: ESOPHAGOGASTRODUODENOSCOPY (EGD) WITH PROPOFOL;  Surgeon: Clarene Essex, MD;  [facility];  Service: Endoscopy;  Laterality: N/A;  . ESOPHAGOGASTRODUODENOSCOPY (EGD) WITH PROPOFOL N/A 08/19/2019   Procedure: ESOPHAGOGASTRODUODENOSCOPY (EGD) WITH PROPOFOL;  Surgeon: Clarene Essex, MD;  [facility];  Service: Endoscopy;  Laterality: N/A;  . ESOPHAGOGASTRODUODENOSCOPY (EGD) WITH PROPOFOL N/A 03/29/2020   Procedure: ESOPHAGOGASTRODUODENOSCOPY (EGD) WITH PROPOFOL;  Surgeon: Clarene Essex, MD;  [facility];  Service: Endoscopy;  Laterality: N/A;  . [place]   trauma   . HEMOSTASIS CLIP PLACEMENT  08/19/2019  Procedure: HEMOSTASIS CLIP PLACEMENT;  Surgeon: Clarene Essex, MD;  [facility];  Service: Endoscopy;;  . HOT HEMOSTASIS N/A 01/28/2018   Procedure: HOT HEMOSTASIS (ARGON PLASMA COAGULATION/BICAP);  Surgeon: Clarene Essex, MD;  [facility];  Service: Endoscopy;  Laterality: N/A;  . LAPAROSCOPIC CHOLECYSTECTOMY  08/20/2001   w/ ERCP spincterotomy w/ balloon  . NM MYOVIEW LTD  7/'19; 6/'21   LOW RISK.  EF > 65%. No Ischemia or Infarction. Normal Study: Hyperdynamic.  Marland Kitchen POLYPECTOMY  08/19/2019   Procedure: POLYPECTOMY;  Surgeon:  Clarene Essex, MD;  [facility];  Service: Endoscopy;;  . TRANSTHORACIC ECHOCARDIOGRAM  02/13/2016   LVEF 55-60%. Gr 1 DD. ? possible bicuspid AoV w/ moderate thickened / calcified AoV w/ mild Stenosis (Mean Gradietn 15 mmHg, Valve area 1.34cm^2), no regurg.    . TRANSTHORACIC ECHOCARDIOGRAM  06/2018   EF 55-65%. Gr 1 DD. Mild AS (? Functionally bicuspid AoV with moderate leaflet thickening) - Mean Gradient 15 mmHg, Peak 32 mmHg  . TUBAL LIGATION Bilateral yrs ago    There were no vitals filed for this visit.    Subjective Assessment - 06/01/21 1247    Subjective COVID-19 screen performed prior to patient entering clinic.  The patient reports a long history of low back pain but much worse over the last several months.  She reports two occasions in which her knees gave way and she fell.  She uses heat a lot at home.  Recommend she use a times heating pad to avoid burns.  Her pain-level is an 8/10 today and increases the more she is up and moving. She is experiencing numbness over her right anterior thigh. She has had traction to her spine in the past which was helpful.  She had some massage theray that helped her back but made her right Le symptoms worse.    Pertinent History COPD, GERD, bilateral CTS, DM,.    How long can you sit comfortably? Varies.    How long can you stand comfortably? Varies.    How long can you walk comfortably? Short community distance with cane.    Diagnostic tests MRI,    Patient Stated Goals Reduce pain and get around better and not have legs give way.    Currently in Pain? Yes    Pain Score 8     Pain Location Back   Right thigh.   Pain Orientation Right    Pain Descriptors / Indicators Sore;Numbness;Shooting    Pain Type Chronic pain    Pain Onset More than a month ago    Pain Frequency Constant    Aggravating Factors  See above.    Pain Relieving Factors See above.              [facility] PT Assessment - 06/01/21 0001      Assessment   Medical  Diagnosis Lumbar radiculopathy, right.    Referring Provider (PT) Marton Redwood MD    Onset Date/Surgical Date --   Several months.     Precautions   Precautions Fall    Precaution Comments Gilford Rile is recommended.      Restrictions   Weight Bearing Restrictions No      Balance Screen   Has the patient fallen in the past 6 months Yes    How many times? 2.    Has the patient had a decrease in activity level because of a fear of falling?  Yes    Is the patient reluctant to leave their home because  of a fear of falling?  No      Home Environment   Living Environment Private residence      Prior Function   Level of Independence Independent      Observation/Other Assessments   Observations Some skin marbling due to using heat at home.      Posture/Postural Control   Posture/Postural Control Postural limitations    Postural Limitations Rounded Shoulders;Forward head;Decreased lumbar lordosis;Flexed trunk    Posture Comments Left iliac crest higher than right.      Deep Tendon Reflexes   DTR Assessment Site Patella;Achilles    Patella DTR 2+    Achilles DTR --   Rt is 2+/4+ and left is absent.     ROM / Strength   AROM / PROM / Strength AROM;Strength      AROM   Overall AROM Comments Active lumbar flexion is limited by 50% and extension to neutral.      Strength   Overall Strength Comments Normal bilateral LE strength.      Palpation   Palpation comment Tender acorss lowwe lumbar region and especially over her right SIJ.      Special Tests   Other special tests (-) SLR and FABER testing.  Right LE shorter than left due to a posterior pelvic rotation.  Corrected a reverse muscle energy technique.      Ambulation/Gait   Gait Comments Antalgic gait with patient walking in a flexed spine posture using a straight cane.                      Objective measurements completed on examination: See above findings.       [facility] Adult PT Treatment/Exercise - 06/01/21  0001      Modalities   Modalities Electrical Stimulation;Moist Heat      Moist Heat Therapy   Number Minutes Moist Heat 20 Minutes    Moist Heat Location Lumbar Spine      Electrical Stimulation   Electrical Stimulation Location Lower lumbar/SIJ's.    Electrical Stimulation Action IFC at 80-150 Hz.    Electrical Stimulation Parameters 40% scan x 20 minutes.    Electrical Stimulation Goals Pain;Tone                       PT Long Term Goals - 06/01/21 1316      PT LONG TERM GOAL #1   Title Independent with an HEP.    Time 6    Period Weeks    Status New      PT LONG TERM GOAL #2   Title Eliminate right thigh numbness.    Time 6    Period Weeks    Status New      PT LONG TERM GOAL #3   Title Stand in erect posture.    Time 6    Period Weeks    Status New      PT LONG TERM GOAL #4   Title Perform ADL's with pain not > 3-4/10.    Time 6    Period Weeks    Status New                  Plan - 06/01/21 1310    Clinical Impression Statement The patient presents to the clinic with c/o low back pain and numbness over his right anterior thigh.  Her posture is remarkable for a loss of lordosis and riht posterior pelvic tilting.  Her lumbar active  range of motion is limited.  She has an absent left Achilles DTR.  Her LE strength is essentially normal.  She has diffuse palpable pain across hr lower lumbar region and especially over her right SIJ.  Her fucntional mobility is impaired.  Patient will benefit from skilled physical therapy intervention to address pain and deficits.    Personal Factors and Comorbidities Comorbidity 1;Comorbidity 2;Other    Comorbidities COPD, GERD, bilateral CTS, DM,.    Examination-Activity Limitations Other;Locomotion Level    Examination-Participation Restrictions Other    Stability/Clinical Decision Making Evolving/Moderate complexity    Clinical Decision Making Low    Rehab Potential Good    PT Frequency 2x / week    PT  Duration 6 weeks    PT Treatment/Interventions ADLs/Self Care Home Management;Cryotherapy;Electrical Stimulation;Ultrasound;Traction;Moist Heat;Functional mobility training;Therapeutic activities;Therapeutic exercise;Manual techniques;Patient/family education;Passive range of motion;Dry needling    PT Next Visit Plan SKTC, hip bridges, reverse muscle energy to equalize leg lengths.  Modalites and STW/M, intermittment traction beginning at 60#.    Consulted and Agree with Plan of Care Patient           Patient will benefit from skilled therapeutic intervention in order to improve the following deficits and impairments:  Pain,Abnormal gait,Decreased activity tolerance,Decreased mobility,Decreased range of motion,Increased muscle spasms,Postural dysfunction  Visit Diagnosis: Chronic right-sided low back pain with right-sided sciatica - Plan: PT plan of care cert/re-cert  Abnormal posture - Plan: PT plan of care cert/re-cert     Problem List Patient Active Problem List   Diagnosis Date Noted  . Multiple lung nodules on CT 09/20/2019  . COPD mixed type ([place]) 09/16/2018  . Coronary artery calcification seen on computed tomography 06/30/2018  . Precordial pain 06/30/2018  . Moderate aortic stenosis by prior echocardiogram 06/30/2018  . Anemia 01/17/2017  . Depression 01/17/2017  . Thyroid nodule 01/17/2017  . Kidney stone 01/17/2017  . Osteopenia 01/17/2017  . Hyperlipidemia associated with type 2 diabetes mellitus ([place]) 01/17/2017  . Leukocytosis 01/17/2017  . Adenomatous colon polyp 01/17/2017  . Dyspnea 07/20/2011  . DIABETES, TYPE 2 07/19/2010  . Essential hypertension 07/19/2010  . Seasonal and perennial allergic rhinitis 07/19/2010  . SIALADENITIS, RIGHT 07/19/2010  . G E R D 07/19/2010  . Obstructive sleep apnea 06/06/2010    Marcelline Temkin, [place] MPT 06/01/2021, 1:20 PM  [facility] [address] [place], [place], [address] Phone:  [phone]   Fax:  [phone]  Name: NIEMA CARRARA MRN: [id number] Date of Birth: 27-Jun-1941

## 2021-06-05 ENCOUNTER — Ambulatory Visit: Payer: Medicare Other | Admitting: Physical Therapy

## 2021-06-05 ENCOUNTER — Other Ambulatory Visit: Payer: Self-pay

## 2021-06-05 DIAGNOSIS — M5441 Lumbago with sciatica, right side: Secondary | ICD-10-CM | POA: Diagnosis not present

## 2021-06-05 DIAGNOSIS — G8929 Other chronic pain: Secondary | ICD-10-CM | POA: Diagnosis not present

## 2021-06-05 DIAGNOSIS — R293 Abnormal posture: Secondary | ICD-10-CM

## 2021-06-05 NOTE — Therapy (Signed)
[facility] [place], [place], [address] Phone: [phone]   Fax:  [phone]  Physical Therapy Treatment  Patient Details  Name: [name] [name] MRN: [id number] Date of Birth: 1941-01-16 Referring Provider (PT): Marton Redwood MD   Encounter Date: 06/05/2021   PT End of Session - 06/05/21 1125    Visit Number 2    Number of Visits 12    Date for PT Re-Evaluation 08/31/21    Authorization Type FOTO AT LEAST EVERY 5TH VISIT.  PROGRESS NOTE AT 10TH VISIT.  KX MODIFIER AFTER 15 VISITS.    PT Start Time 1117    PT Stop Time 1203    PT Time Calculation (min) 46 min    Activity Tolerance Patient tolerated treatment well    Behavior During Therapy WFL for tasks assessed/performed           Past Medical History:  Diagnosis Date  . Aortic valve stenosis, mild    per echo 02-13-2016  possible bicuspid w/ moderate thickened and calicified ,  valve area 1.34cm^2    pt denies  . COPD (chronic obstructive pulmonary disease) ([place])   . GERD (gastroesophageal reflux disease)    no problems since started on CPAP  . Hematuria   . History of adenomatous polyp of colon    tubular adnenoma's and hyperplastic polyp's 05-19-2002;  08-15-2010;  2015  . History of kidney stones    multiple since 1970's  . History of squamous cell carcinoma excision    05-14-2012  right arm;  04-18-2015  left ankle  . HTN (hypertension)   . Hyperlipidemia   . Iron deficiency anemia   . Left ureteral stone   . OSA on CPAP    per study 10-13-2006 mild to moderate osa w/ hypersomnia  . Seasonal and perennial allergic rhinitis   . Type 2 diabetes mellitus (HCC)    type 2  . Urgency of urination     Past Surgical History:  Procedure Laterality Date  . BIOPSY  03/29/2020   Procedure: BIOPSY;  Surgeon: Clarene Essex, MD;  [facility];  Service: Endoscopy;;  . CARPAL TUNNEL RELEASE Bilateral 2003  . COLONOSCOPY  last one 2015  . CT CHEST WITH  CONTRAST   ([place] HX)     Advanced aortic and branch vessel atherosclerosis. Tortuous thoracic aorta. Normal heart size, without pericardial effusion. Multivessel coronary artery atherosclerosis  . CYSTOSCOPY WITH RETROGRADE PYELOGRAM, URETEROSCOPY AND STENT PLACEMENT Left 04/15/2017   Procedure: CYSTOSCOPY WITH LEFT  RETROGRADE PYELOGRAM, URETEROSCOPYwith stone basketry;  Surgeon: Kathie Rhodes, MD;  Location: [facility];  Service: Urology;  Laterality: Left;  . D & C HYSTEROSCOPY W/ RESECTION FIBROID AND POLYP  07/30/2000  . DOBUTAMINE STRESS ECHO  01-30-2008   [facility]   no ischemia/  normal wall motion/  post stress ef 79%  . ESOPHAGOGASTRODUODENOSCOPY (EGD) WITH PROPOFOL N/A 01/28/2018   Procedure: ESOPHAGOGASTRODUODENOSCOPY (EGD) WITH PROPOFOL;  Surgeon: Clarene Essex, MD;  [facility];  Service: Endoscopy;  Laterality: N/A;  . ESOPHAGOGASTRODUODENOSCOPY (EGD) WITH PROPOFOL N/A 08/19/2019   Procedure: ESOPHAGOGASTRODUODENOSCOPY (EGD) WITH PROPOFOL;  Surgeon: Clarene Essex, MD;  [facility];  Service: Endoscopy;  Laterality: N/A;  . ESOPHAGOGASTRODUODENOSCOPY (EGD) WITH PROPOFOL N/A 03/29/2020   Procedure: ESOPHAGOGASTRODUODENOSCOPY (EGD) WITH PROPOFOL;  Surgeon: Clarene Essex, MD;  [facility];  Service: Endoscopy;  Laterality: N/A;  . [place]   trauma   . HEMOSTASIS CLIP PLACEMENT  08/19/2019  Procedure: HEMOSTASIS CLIP PLACEMENT;  Surgeon: Clarene Essex, MD;  [facility];  Service: Endoscopy;;  . HOT HEMOSTASIS N/A 01/28/2018   Procedure: HOT HEMOSTASIS (ARGON PLASMA COAGULATION/BICAP);  Surgeon: Clarene Essex, MD;  Location[facility];  Service: Endoscopy;  Laterality: N/A;  . LAPAROSCOPIC CHOLECYSTECTOMY  08/20/2001   w/ ERCP spincterotomy w/ balloon  . NM MYOVIEW LTD  7/'19; 6/'21   LOW RISK.  EF > 65%. No Ischemia or Infarction. Normal Study: Hyperdynamic.  Marland Kitchen POLYPECTOMY  08/19/2019   Procedure: POLYPECTOMY;  Surgeon:  Clarene Essex, MD;  [facility]: [facility];  Service: Endoscopy;;  . TRANSTHORACIC ECHOCARDIOGRAM  02/13/2016   LVEF 55-60%. Gr 1 DD. ? possible bicuspid AoV w/ moderate thickened / calcified AoV w/ mild Stenosis (Mean Gradietn 15 mmHg, Valve area 1.34cm^2), no regurg.    . TRANSTHORACIC ECHOCARDIOGRAM  06/2018   EF 55-65%. Gr 1 DD. Mild AS (? Functionally bicuspid AoV with moderate leaflet thickening) - Mean Gradient 15 mmHg, Peak 32 mmHg  . TUBAL LIGATION Bilateral yrs ago    There were no vitals filed for this visit.   Subjective Assessment - 06/05/21 1120    Subjective COVID-19 screen performed prior to patient entering clinic. Patient arrived with ongoing symptoms in right LE.    Pertinent History COPD, GERD, bilateral CTS, DM,.    How long can you sit comfortably? Varies.    How long can you stand comfortably? Varies.    How long can you walk comfortably? Short community distance with cane.    Diagnostic tests MRI,    Currently in Pain? Yes    Pain Score 5     Pain Location Back    Pain Orientation Right    Pain Descriptors / Indicators Sore;Discomfort    Pain Type Chronic pain    Pain Radiating Towards right LE    Pain Onset More than a month ago    Pain Frequency Constant    Aggravating Factors  transition movements    Pain Relieving Factors upright posture, rest                             [facility] Adult PT Treatment/Exercise - 06/05/21 0001      Exercises   Exercises Lumbar      Lumbar Exercises: Stretches   Single Knee to Chest Stretch Right;3 reps;30 seconds    Lower Trunk Rotation 10 seconds;4 reps      Lumbar Exercises: Aerobic   Nustep L3 x26min UE/LE activity, monitored for progression      Lumbar Exercises: Supine   Ab Set 20 reps;5 seconds    Glut Set 5 seconds;20 reps    Other Supine Lumbar Exercises grey ball squeeze x20    Other Supine Lumbar Exercises reverse muscle technique on rt LE x5      Lumbar Exercises: Sidelying   Other  Sidelying Lumbar Exercises sideyling positional traction over boulster x 41min      Moist Heat Therapy   Number Minutes Moist Heat 10 Minutes    Moist Heat Location Lumbar Spine      Electrical Stimulation   Electrical Stimulation Location low back    Electrical Stimulation Action IFC    Electrical Stimulation Parameters 80-150hz  x42min    Electrical Stimulation Goals Pain;Tone                       PT Long Term Goals - 06/05/21 1125  PT LONG TERM GOAL #1   Title Independent with an HEP.    Time 6    Period Weeks    Status On-going      PT LONG TERM GOAL #2   Title Eliminate right thigh numbness.    Baseline ongoing symptoms 06/05/21    Time 6    Period Weeks    Status On-going      PT LONG TERM GOAL #3   Title Stand in erect posture.    Baseline foward flexed posture 06/05/21    Time 6    Period Weeks    Status On-going      PT LONG TERM GOAL #4   Title Perform ADL's with pain not > 3-4/10.    Baseline 5-8/10 pain with ADL's 06/05/21    Time 6    Period Weeks    Status On-going                 Plan - 06/05/21 1155    Clinical Impression Statement Patient tolerated treatment well today. Patient able to start positional traction today with no increased pain. Patient progressed through abdominal bracing and stretches to reduce pain and strengthen muscles. Patient current goals ongoing due to pain deficts.    Personal Factors and Comorbidities Comorbidity 1;Comorbidity 2;Other    Comorbidities COPD, GERD, bilateral CTS, DM,.    Examination-Activity Limitations Other;Locomotion Level    Examination-Participation Restrictions Other    Stability/Clinical Decision Making Evolving/Moderate complexity    Rehab Potential Good    PT Frequency 2x / week    PT Duration 6 weeks    PT Treatment/Interventions ADLs/Self Care Home Management;Cryotherapy;Electrical Stimulation;Ultrasound;Traction;Moist Heat;Functional mobility training;Therapeutic  activities;Therapeutic exercise;Manual techniques;Patient/family education;Passive range of motion;Dry needling    PT Next Visit Plan cont with POC for SKTC, hip bridges, reverse muscle energy to equalize leg lengths.  Modalites and STW/M, intermittment traction beginning at 60#. issue HEP next treatment    Consulted and Agree with Plan of Care Patient           Patient will benefit from skilled therapeutic intervention in order to improve the following deficits and impairments:  Pain,Abnormal gait,Decreased activity tolerance,Decreased mobility,Decreased range of motion,Increased muscle spasms,Postural dysfunction  Visit Diagnosis: Chronic right-sided low back pain with right-sided sciatica  Abnormal posture     Problem List Patient Active Problem List   Diagnosis Date Noted  . Multiple lung nodules on CT 09/20/2019  . COPD mixed type ([place]) 09/16/2018  . Coronary artery calcification seen on computed tomography 06/30/2018  . Precordial pain 06/30/2018  . Moderate aortic stenosis by prior echocardiogram 06/30/2018  . Anemia 01/17/2017  . Depression 01/17/2017  . Thyroid nodule 01/17/2017  . Kidney stone 01/17/2017  . Osteopenia 01/17/2017  . Hyperlipidemia associated with type 2 diabetes mellitus ([place]) 01/17/2017  . Leukocytosis 01/17/2017  . Adenomatous colon polyp 01/17/2017  . Dyspnea 07/20/2011  . DIABETES, TYPE 2 07/19/2010  . Essential hypertension 07/19/2010  . Seasonal and perennial allergic rhinitis 07/19/2010  . SIALADENITIS, RIGHT 07/19/2010  . G E R D 07/19/2010  . Obstructive sleep apnea 06/06/2010    Phillips Climes, PTA 06/05/2021, 12:06 PM  [facility] [place], [place], [address] Phone: [phone]   Fax:  [phone]  Name: [name] [name] MRN: [id number] Date of Birth: 10-08-1941

## 2021-06-07 ENCOUNTER — Ambulatory Visit: Payer: Medicare Other | Admitting: Physical Therapy

## 2021-06-07 ENCOUNTER — Other Ambulatory Visit: Payer: Self-pay

## 2021-06-07 DIAGNOSIS — G8929 Other chronic pain: Secondary | ICD-10-CM

## 2021-06-07 DIAGNOSIS — R293 Abnormal posture: Secondary | ICD-10-CM

## 2021-06-07 DIAGNOSIS — M5441 Lumbago with sciatica, right side: Secondary | ICD-10-CM | POA: Diagnosis not present

## 2021-06-07 NOTE — Therapy (Signed)
[facility] [place], [place], [address] Phone: [phone]   Fax:  [phone]  Physical Therapy Treatment  Patient Details  Name: [name] [name] MRN: [id number] Date of Birth: 06/21/1941 Referring Provider (PT): Marton Redwood MD   Encounter Date: 06/07/2021   PT End of Session - 06/07/21 1137    Visit Number 3    Number of Visits 12    Date for PT Re-Evaluation 08/31/21    Authorization Type FOTO AT LEAST EVERY 5TH VISIT.  PROGRESS NOTE AT 10TH VISIT.  KX MODIFIER AFTER 15 VISITS.    PT Start Time 1116    PT Stop Time 1200    PT Time Calculation (min) 44 min    Activity Tolerance Patient tolerated treatment well    Behavior During Therapy WFL for tasks assessed/performed           Past Medical History:  Diagnosis Date  . Aortic valve stenosis, mild    per echo 02-13-2016  possible bicuspid w/ moderate thickened and calicified ,  valve area 1.34cm^2    pt denies  . COPD (chronic obstructive pulmonary disease) ([place])   . GERD (gastroesophageal reflux disease)    no problems since started on CPAP  . Hematuria   . History of adenomatous polyp of colon    tubular adnenoma's and hyperplastic polyp's 05-19-2002;  08-15-2010;  2015  . History of kidney stones    multiple since 1970's  . History of squamous cell carcinoma excision    05-14-2012  right arm;  04-18-2015  left ankle  . HTN (hypertension)   . Hyperlipidemia   . Iron deficiency anemia   . Left ureteral stone   . OSA on CPAP    per study 10-13-2006 mild to moderate osa w/ hypersomnia  . Seasonal and perennial allergic rhinitis   . Type 2 diabetes mellitus (HCC)    type 2  . Urgency of urination     Past Surgical History:  Procedure Laterality Date  . BIOPSY  03/29/2020   Procedure: BIOPSY;  Surgeon: Clarene Essex, MD;  Location: [facility];  Service: Endoscopy;;  . CARPAL TUNNEL RELEASE Bilateral 2003  . COLONOSCOPY  last one 2015  . CT CHEST WITH  CONTRAST   ([place] HX)     Advanced aortic and branch vessel atherosclerosis. Tortuous thoracic aorta. Normal heart size, without pericardial effusion. Multivessel coronary artery atherosclerosis  . CYSTOSCOPY WITH RETROGRADE PYELOGRAM, URETEROSCOPY AND STENT PLACEMENT Left 04/15/2017   Procedure: CYSTOSCOPY WITH LEFT  RETROGRADE PYELOGRAM, URETEROSCOPYwith stone basketry;  Surgeon: Kathie Rhodes, MD;  Location: [facility];  Service: Urology;  Laterality: Left;  . D & C HYSTEROSCOPY W/ RESECTION FIBROID AND POLYP  07/30/2000  . DOBUTAMINE STRESS ECHO  01-30-2008   [facility]   no ischemia/  normal wall motion/  post stress ef 79%  . ESOPHAGOGASTRODUODENOSCOPY (EGD) WITH PROPOFOL N/A 01/28/2018   Procedure: ESOPHAGOGASTRODUODENOSCOPY (EGD) WITH PROPOFOL;  Surgeon: Clarene Essex, MD;  [facility];  Service: Endoscopy;  Laterality: N/A;  . ESOPHAGOGASTRODUODENOSCOPY (EGD) WITH PROPOFOL N/A 08/19/2019   Procedure: ESOPHAGOGASTRODUODENOSCOPY (EGD) WITH PROPOFOL;  Surgeon: Clarene Essex, MD;  [facility];  Service: Endoscopy;  Laterality: N/A;  . ESOPHAGOGASTRODUODENOSCOPY (EGD) WITH PROPOFOL N/A 03/29/2020   Procedure: ESOPHAGOGASTRODUODENOSCOPY (EGD) WITH PROPOFOL;  Surgeon: Clarene Essex, MD;  [facility];  Service: Endoscopy;  Laterality: N/A;  . [place]   trauma   . HEMOSTASIS CLIP PLACEMENT  08/19/2019  Procedure: HEMOSTASIS CLIP PLACEMENT;  Surgeon: Clarene Essex, MD;  [facility];  Service: Endoscopy;;  . HOT HEMOSTASIS N/A 01/28/2018   Procedure: HOT HEMOSTASIS (ARGON PLASMA COAGULATION/BICAP);  Surgeon: Clarene Essex, MD;  Location: [facility];  Service: Endoscopy;  Laterality: N/A;  . LAPAROSCOPIC CHOLECYSTECTOMY  08/20/2001   w/ ERCP spincterotomy w/ balloon  . NM MYOVIEW LTD  7/'19; 6/'21   LOW RISK.  EF > 65%. No Ischemia or Infarction. Normal Study: Hyperdynamic.  Marland [place] POLYPECTOMY  08/19/2019   Procedure: POLYPECTOMY;  Surgeon:  Clarene Essex, MD;  [facility]: [facility];  Service: Endoscopy;;  . TRANSTHORACIC ECHOCARDIOGRAM  02/13/2016   LVEF 55-60%. Gr 1 DD. ? possible bicuspid AoV w/ moderate thickened / calcified AoV w/ mild Stenosis (Mean Gradietn 15 mmHg, Valve area 1.34cm^2), no regurg.    . TRANSTHORACIC ECHOCARDIOGRAM  06/2018   EF 55-65%. Gr 1 DD. Mild AS (? Functionally bicuspid AoV with moderate leaflet thickening) - Mean Gradient 15 mmHg, Peak 32 mmHg  . TUBAL LIGATION Bilateral yrs ago    There were no vitals filed for this visit.   Subjective Assessment - 06/07/21 1118    Subjective COVID-19 screen performed prior to patient entering clinic. Patient arrived with ongoing symptoms in right LE, yet reported doing dood after last treatment    Pertinent History COPD, GERD, bilateral CTS, DM,.    How long can you sit comfortably? Varies.    How long can you stand comfortably? Varies.    How long can you walk comfortably? Short community distance with cane.    Diagnostic tests MRI,    Patient Stated Goals Reduce pain and get around better and not have legs give way.    Currently in Pain? Yes    Pain Score 8     Pain Location Back    Pain Orientation Right    Pain Descriptors / Indicators Discomfort    Pain Type Chronic pain    Pain Radiating Towards Right LE down to knee    Pain Onset More than a month ago    Pain Frequency Constant    Aggravating Factors  transition movements    Pain Relieving Factors upright posture                             [facility] Adult PT Treatment/Exercise - 06/07/21 0001      Lumbar Exercises: Stretches   Single Knee to Chest Stretch Right;3 reps;30 seconds      Lumbar Exercises: Aerobic   Nustep L3 x65min UE/LE activity, monitored for progression      Lumbar Exercises: Supine   Ab Set 20 reps;5 seconds    Glut Set 5 seconds;20 reps    Clam 20 reps;3 seconds    Bent Knee Raise 3 seconds   2x10   Other Supine Lumbar Exercises grey ball squeeze x20     Other Supine Lumbar Exercises reverse muscle technique on rt LE x5      Modalities   Modalities Traction      Traction   Type of Traction Lumbar    Min (lbs) 5    Max (lbs) 60    Hold Time 99    Rest Time 5    Time 15                       PT Long Term Goals - 06/05/21 1125      PT LONG TERM  GOAL #1   Title Independent with an HEP.    Time 6    Period Weeks    Status On-going      PT LONG TERM GOAL #2   Title Eliminate right thigh numbness.    Baseline ongoing symptoms 06/05/21    Time 6    Period Weeks    Status On-going      PT LONG TERM GOAL #3   Title Stand in erect posture.    Baseline foward flexed posture 06/05/21    Time 6    Period Weeks    Status On-going      PT LONG TERM GOAL #4   Title Perform ADL's with pain not > 3-4/10.    Baseline 5-8/10 pain with ADL's 06/05/21    Time 6    Period Weeks    Status On-going                 Plan - 06/07/21 1138    Clinical Impression Statement Patient tolerated treatment well today. Patient has progressed with abdominal bracing and slow progression today. Patient doing well with activities yet has ongoing right LE symptoms down leg to a little past knee. Patient responded well today to traction at 60#. Patient goals progresisng this week.    Personal Factors and Comorbidities Comorbidity 1;Comorbidity 2;Other    Comorbidities COPD, GERD, bilateral CTS, DM,.    Examination-Activity Limitations Other;Locomotion Level    Examination-Participation Restrictions Other    Stability/Clinical Decision Making Evolving/Moderate complexity    Rehab Potential Good    PT Frequency 2x / week    PT Duration 6 weeks    PT Treatment/Interventions ADLs/Self Care Home Management;Cryotherapy;Electrical Stimulation;Ultrasound;Traction;Moist Heat;Functional mobility training;Therapeutic activities;Therapeutic exercise;Manual techniques;Patient/family education;Passive range of motion;Dry needling    PT Next Visit Plan  assess traction and cont with POC for progression with traction exercise progression hip bridges Modalites and STW/M issue HEP    Consulted and Agree with Plan of Care Patient           Patient will benefit from skilled therapeutic intervention in order to improve the following deficits and impairments:  Pain,Abnormal gait,Decreased activity tolerance,Decreased mobility,Decreased range of motion,Increased muscle spasms,Postural dysfunction  Visit Diagnosis: Chronic right-sided low back pain with right-sided sciatica  Abnormal posture     Problem List Patient Active Problem List   Diagnosis Date Noted  . Multiple lung nodules on CT 09/20/2019  . COPD mixed type ([place]) 09/16/2018  . Coronary artery calcification seen on computed tomography 06/30/2018  . Precordial pain 06/30/2018  . Moderate aortic stenosis by prior echocardiogram 06/30/2018  . Anemia 01/17/2017  . Depression 01/17/2017  . Thyroid nodule 01/17/2017  . Kidney stone 01/17/2017  . Osteopenia 01/17/2017  . Hyperlipidemia associated with type 2 diabetes mellitus ([place]) 01/17/2017  . Leukocytosis 01/17/2017  . Adenomatous colon polyp 01/17/2017  . Dyspnea 07/20/2011  . DIABETES, TYPE 2 07/19/2010  . Essential hypertension 07/19/2010  . Seasonal and perennial allergic rhinitis 07/19/2010  . SIALADENITIS, RIGHT 07/19/2010  . G E R D 07/19/2010  . Obstructive sleep apnea 06/06/2010    Phillips Climes, PTA 06/07/2021, 12:11 PM  [facility] [place], [place], [address] Phone: [phone]   Fax:  [phone]  Name: [name] [name] MRN: [id number] Date of Birth: April 22, 1941

## 2021-06-12 ENCOUNTER — Ambulatory Visit: Payer: Medicare Other | Admitting: Physical Therapy

## 2021-06-12 ENCOUNTER — Other Ambulatory Visit: Payer: Self-pay

## 2021-06-12 ENCOUNTER — Telehealth: Payer: Self-pay | Admitting: Hematology

## 2021-06-12 DIAGNOSIS — G8929 Other chronic pain: Secondary | ICD-10-CM | POA: Diagnosis not present

## 2021-06-12 DIAGNOSIS — R293 Abnormal posture: Secondary | ICD-10-CM

## 2021-06-12 DIAGNOSIS — M5441 Lumbago with sciatica, right side: Secondary | ICD-10-CM | POA: Diagnosis not present

## 2021-06-12 NOTE — Therapy (Signed)
[facility] [place], [place], [address] Phone: [phone]   Fax:  [phone]  Physical Therapy Treatment  Patient Details  Name: [name] [name] MRN: [id number] Date of Birth: 29-Nov-1941 Referring Provider (PT): Marton Redwood MD   Encounter Date: 06/12/2021   PT End of Session - 06/12/21 1125     Visit Number 4    Number of Visits 12    Date for PT Re-Evaluation 08/31/21    Authorization Type FOTO AT LEAST EVERY 5TH VISIT.  PROGRESS NOTE AT 10TH VISIT.  KX MODIFIER AFTER 15 VISITS.    PT Start Time 1115    PT Stop Time 1201    PT Time Calculation (min) 46 min    Activity Tolerance Patient tolerated treatment well    Behavior During Therapy WFL for tasks assessed/performed             Past Medical History:  Diagnosis Date   Aortic valve stenosis, mild    per echo 02-13-2016  possible bicuspid w/ moderate thickened and calicified ,  valve area 1.34cm^2    pt denies   COPD (chronic obstructive pulmonary disease) ([place])    GERD (gastroesophageal reflux disease)    no problems since started on CPAP   Hematuria    History of adenomatous polyp of colon    tubular adnenoma's and hyperplastic polyp's 05-19-2002;  08-15-2010;  2015   History of kidney stones    multiple since 1970's   History of squamous cell carcinoma excision    05-14-2012  right arm;  04-18-2015  left ankle   HTN (hypertension)    Hyperlipidemia    Iron deficiency anemia    Left ureteral stone    OSA on CPAP    per study 10-13-2006 mild to moderate osa w/ hypersomnia   Seasonal and perennial allergic rhinitis    Type 2 diabetes mellitus ([place])    type 2   Urgency of urination     Past Surgical History:  Procedure Laterality Date   BIOPSY  03/29/2020   Procedure: BIOPSY;  Surgeon: Clarene Essex, MD;  [facility]: [facility];  Service: Endoscopy;;   CARPAL TUNNEL RELEASE Bilateral 2003   COLONOSCOPY  last one 2015   CT CHEST WITH CONTRAST    ([place] HX)     Advanced aortic and branch vessel atherosclerosis. Tortuous thoracic aorta. Normal heart size, without pericardial effusion. Multivessel coronary artery atherosclerosis   CYSTOSCOPY WITH RETROGRADE PYELOGRAM, URETEROSCOPY AND STENT PLACEMENT Left 04/15/2017   Procedure: CYSTOSCOPY WITH LEFT  RETROGRADE PYELOGRAM, URETEROSCOPYwith stone basketry;  Surgeon: Kathie Rhodes, MD;  Location: [facility];  Service: Urology;  Laterality: Left;   D & C HYSTEROSCOPY W/ RESECTION FIBROID AND POLYP  07/30/2000   DOBUTAMINE STRESS ECHO  01-30-2008   [facility]   no ischemia/  normal wall motion/  post stress ef 79%   ESOPHAGOGASTRODUODENOSCOPY (EGD) WITH PROPOFOL N/A 01/28/2018   Procedure: ESOPHAGOGASTRODUODENOSCOPY (EGD) WITH PROPOFOL;  Surgeon: Clarene Essex, MD;  [facility];  Service: Endoscopy;  Laterality: N/A;   ESOPHAGOGASTRODUODENOSCOPY (EGD) WITH PROPOFOL N/A 08/19/2019   Procedure: ESOPHAGOGASTRODUODENOSCOPY (EGD) WITH PROPOFOL;  Surgeon: Clarene Essex, MD;  [facility];  Service: Endoscopy;  Laterality: N/A;   ESOPHAGOGASTRODUODENOSCOPY (EGD) WITH PROPOFOL N/A 03/29/2020   Procedure: ESOPHAGOGASTRODUODENOSCOPY (EGD) WITH PROPOFOL;  Surgeon: Clarene Essex, MD;  [facility];  Service: Endoscopy;  Laterality: N/A;   FACIAL FRACTURE SURGERY  1955   trauma    HEMOSTASIS CLIP PLACEMENT  08/19/2019   Procedure: HEMOSTASIS CLIP PLACEMENT;  Surgeon: Clarene Essex, MD;  [facility]: [facility];  Service: Endoscopy;;   HOT HEMOSTASIS N/A 01/28/2018   Procedure: HOT HEMOSTASIS (ARGON PLASMA COAGULATION/BICAP);  Surgeon: Clarene Essex, MD;  Location: [facility];  Service: Endoscopy;  Laterality: N/A;   LAPAROSCOPIC CHOLECYSTECTOMY  08/20/2001   w/ ERCP spincterotomy w/ balloon   NM MYOVIEW LTD  7/'19; 6/'21   LOW RISK.  EF > 65%. No Ischemia or Infarction. Normal Study: Hyperdynamic.   POLYPECTOMY  08/19/2019   Procedure: POLYPECTOMY;  Surgeon: Clarene Essex, MD;   [facility]: [facility];  Service: Endoscopy;;   TRANSTHORACIC ECHOCARDIOGRAM  02/13/2016   LVEF 55-60%. Gr 1 DD. ? possible bicuspid AoV w/ moderate thickened / calcified AoV w/ mild Stenosis (Mean Gradietn 15 mmHg, Valve area 1.34cm^2), no regurg.     TRANSTHORACIC ECHOCARDIOGRAM  06/2018   EF 55-65%. Gr 1 DD. Mild AS (? Functionally bicuspid AoV with moderate leaflet thickening) - Mean Gradient 15 mmHg, Peak 32 mmHg   TUBAL LIGATION Bilateral yrs ago    There were no vitals filed for this visit.   Subjective Assessment - 06/12/21 1121     Subjective COVID-19 screen performed prior to patient entering clinic. Patient arrived with less  pain and reported doing well after last treatment.    Pertinent History COPD, GERD, bilateral CTS, DM,.    How long can you sit comfortably? Varies.    How long can you stand comfortably? Varies.    How long can you walk comfortably? Short community distance with cane.    Diagnostic tests MRI,    Patient Stated Goals Reduce pain and get around better and not have legs give way.    Currently in Pain? Yes    Pain Score 7     Pain Location Back    Pain Orientation Right    Pain Descriptors / Indicators Discomfort    Pain Type Chronic pain    Pain Onset More than a month ago    Pain Frequency Constant    Aggravating Factors  transition movements    Pain Relieving Factors upright posture                               [facility] Adult PT Treatment/Exercise - 06/12/21 0001       Lumbar Exercises: Aerobic   Nustep L3 x49min UE/LE activity, monitored for progression      Lumbar Exercises: Supine   Clam 20 reps;3 seconds    Bent Knee Raise 3 seconds   2x10   Bridge 20 reps    Straight Leg Raise 3 seconds   2x10   Other Supine Lumbar Exercises grey ball squeeze x20    Other Supine Lumbar Exercises reverse muscle technique on RT LE x5      Traction   Type of Traction Lumbar    Min (lbs) 5    Max (lbs) 70    Hold Time 99    Rest  Time 5    Time 15                    PT Education - 06/12/21 1132     Education Details HEP    Person(s) Educated Patient    Methods Explanation;Demonstration;Handout    Comprehension Verbalized understanding;Returned demonstration                 PT Long Term Goals - 06/12/21  [place] #1   Title Independent with an HEP.    Time 6    Period Weeks    Status On-going      PT LONG TERM GOAL #2   Title Eliminate right thigh numbness.    Baseline ongoing symptoms 06/12/21    Time 6    Period Weeks    Status On-going      PT LONG TERM GOAL #3   Title Stand in erect posture.    Baseline foward flexed posture 06/12/21    Time 6    Period Weeks    Status On-going      PT LONG TERM GOAL #4   Title Perform ADL's with pain not > 3-4/10.    Baseline 5-7/10 pain with ADL's 06/12/21    Time 6    Period Weeks    Status On-going                   Plan - 06/12/21 1134     Clinical Impression Statement Patient tolerated treatment well today. Patient able to progress with core progression today with HEP provided. Patient has responded well to traction and increasd weight today. Patient continues to have difficulty with prolong activity and ADL's due to pain and breathing difficulty. Goals ongoing this week.    Personal Factors and Comorbidities Comorbidity 1;Comorbidity 2;Other    Comorbidities COPD, GERD, bilateral CTS, DM,.    Examination-Activity Limitations Other;Locomotion Level    Examination-Participation Restrictions Other    Stability/Clinical Decision Making Evolving/Moderate complexity    Rehab Potential Good    PT Frequency 2x / week    PT Duration 6 weeks    PT Treatment/Interventions ADLs/Self Care Home Management;Cryotherapy;Electrical Stimulation;Ultrasound;Traction;Moist Heat;Functional mobility training;Therapeutic activities;Therapeutic exercise;Manual techniques;Patient/family education;Passive range of motion;Dry  needling    PT Next Visit Plan cont with POC for progression with traction exercise progression hip bridges Modalites and STW/M issue HEP    Consulted and Agree with Plan of Care Patient             Patient will benefit from skilled therapeutic intervention in order to improve the following deficits and impairments:  Pain, Abnormal gait, Decreased activity tolerance, Decreased mobility, Decreased range of motion, Increased muscle spasms, Postural dysfunction  Visit Diagnosis: Chronic right-sided low back pain with right-sided sciatica  Abnormal posture     Problem List Patient Active Problem List   Diagnosis Date Noted   Multiple lung nodules on CT 09/20/2019   COPD mixed type ([place]) 09/16/2018   Coronary artery calcification seen on computed tomography 06/30/2018   Precordial pain 06/30/2018   Moderate aortic stenosis by prior echocardiogram 06/30/2018   Anemia 01/17/2017   Depression 01/17/2017   Thyroid nodule 01/17/2017   Kidney stone 01/17/2017   Osteopenia 01/17/2017   Hyperlipidemia associated with type 2 diabetes mellitus ([place]) 01/17/2017   Leukocytosis 01/17/2017   Adenomatous colon polyp 01/17/2017   Dyspnea 07/20/2011   DIABETES, TYPE 2 07/19/2010   Essential hypertension 07/19/2010   Seasonal and perennial allergic rhinitis 07/19/2010   SIALADENITIS, RIGHT 07/19/2010   G E R D 07/19/2010   Obstructive sleep apnea 06/06/2010    Aren Cherne P, PTA 06/12/2021, 12:07 PM  [facility] [place], [place], [address] Phone: [phone]   Fax:  [phone]  Name: [name] [name] MRN: [id number] Date of Birth: 22-Oct-1941

## 2021-06-12 NOTE — Telephone Encounter (Signed)
Scheduled appt per 6/13 referral. Pt aware.  

## 2021-06-12 NOTE — Patient Instructions (Signed)
Pelvic Tilt: Posterior - Legs Bent (Supine)  Tighten stomach and flatten back by rolling pelvis down. Hold _10___ seconds. Relax. Repeat _10-30___ times per set. Do __2__ sets per session. Do _2___ sessions per day.   Bent Leg Lift (Hook-Lying)  Tighten stomach and slowly raise right leg _5___ inches from floor. Keep trunk rigid. Hold _3___ seconds. Repeat _10___ times per set. Do ___2-3_ sets per session. Do __2__ sessions per day.   Straight Leg Raise  Tighten stomach and slowly raise locked right leg __4__ inches from floor. Repeat __10-30__ times per set. Do __2__ sets per session. Do __2__ sessions per day.  Bridging  Slowly raise buttocks from floor, keeping stomach tight. Repeat _10___ times per set. Do __2__ sets per session. Do __2__ sessions per day.

## 2021-06-13 ENCOUNTER — Encounter: Payer: Self-pay | Admitting: Neurology

## 2021-06-13 ENCOUNTER — Ambulatory Visit (INDEPENDENT_AMBULATORY_CARE_PROVIDER_SITE_OTHER): Payer: Medicare Other | Admitting: Neurology

## 2021-06-13 VITALS — BP 120/66 | HR 61 | Ht 63.0 in | Wt 183.0 lb

## 2021-06-13 DIAGNOSIS — R2689 Other abnormalities of gait and mobility: Secondary | ICD-10-CM

## 2021-06-13 DIAGNOSIS — G5711 Meralgia paresthetica, right lower limb: Secondary | ICD-10-CM

## 2021-06-13 DIAGNOSIS — G250 Essential tremor: Secondary | ICD-10-CM | POA: Diagnosis not present

## 2021-06-13 DIAGNOSIS — I2510 Atherosclerotic heart disease of native coronary artery without angina pectoris: Secondary | ICD-10-CM

## 2021-06-13 DIAGNOSIS — R269 Unspecified abnormalities of gait and mobility: Secondary | ICD-10-CM

## 2021-06-13 DIAGNOSIS — R29898 Other symptoms and signs involving the musculoskeletal system: Secondary | ICD-10-CM

## 2021-06-13 DIAGNOSIS — W19XXXS Unspecified fall, sequela: Secondary | ICD-10-CM

## 2021-06-13 NOTE — Patient Instructions (Addendum)
It was nice to meet you today.  I am sorry to hear that you had recent falls with injury to the face.  We will do an MRI of the brain without contrast.  I believe you have a multifactorial gait disorder, meaning, that it is likely is due to a combination of factors. These factors include: normal aging, head tremor, arthritis affecting your back and hip, and possibility of nerve irritation with pinching in the right groin.  While you may be at risk for nerve damage from diabetes or what we call neuropathy, your history and exam does not suggest significant neuropathy.  Please remember to stand up slowly and get your bearings first turn slowly, no bending down to pick anything, no heavy lifting, be extra careful at night and first thing in the morning. Also, be careful in the Bathroom and the kitchen.   Remember to drink plenty of fluid, eat healthy meals and do not skip any meals. Try to eat protein with a every meal and eat a healthy snack such as fruit or nuts or yogurt in between meals. Try to keep a regular sleep-wake schedule and try to exercise daily, particularly in the form of walking, 20-30 minutes a day, if you can. You should consider using a cane more consistently for safety.   Please try to quit smoking.  Please try to hydrate better with water, 6 to 8 cups/day are recommended, 8 ounce size each and reduce your tea intake, try to limit your caffeine intake to 1 or 2 servings per day on average.  As far as your medications are concerned, I would like to suggest no new medications.   As far as diagnostic testing: I suggest we proceed with a brain scan, called MRI. We will call you with the test results. We will have to schedule you for this on a separate date. This test requires authorization from your insurance, and we will take care of the insurance process.   We will do an EMG and nerve conduction velocity test, which is an electrical nerve and muscle test, which we will schedule. We will  call you with the results.  We will call you with the results of your test and make a follow-up appointment in this clinic if needed.

## 2021-06-13 NOTE — Progress Notes (Signed)
Subjective:    Patient ID: [name] [name] is a 80 y.o. female.  HPI    Star Age, MD, PhD [facility] [address], [address] [place], [place] [address]  Dear Dr. Brigitte Pulse,   I saw your patient, [name] [name], upon your kind request in my neurologic clinic today for initial consultation of her gait disorder.  The patient is unaccompanied today.  As you know, [name] is an 80 year old right-handed woman with an underlying complex medical history of aortic stenosis, COPD, nephrolithiasis, CKD, aortic valve stenosis, squamous cell carcinoma, hypertension, hyperlipidemia, hematuria, reflux disease, right leg pain, anemia, seasonal allergies, diabetes, and obesity, who reports balance issues for the past few months.  She reports pain and numbness affecting her right thigh.  Sometimes her leg feels weak on the right side, this comes and goes.  She had recent falls, she fell about a month ago, this was when she was still on Lyrica.  She reports that she had side effects on it and felt drunk.  She stopped the Lyrica but the day after she stopped the Lyrica she had another fall and fell on her face, she injured her face but did not seek immediate medical attention.  She was with her family members at the time, fell outside and was trying to hold onto the front of the car but slipped off the hood of the car.  She realized that she was falling but could not stabilize herself.  She sustained facial bruising and a cut above the right upper lip, and eventually went to [facility] to get checked out, no scan was done of her head.  She admits that she does not hydrate very well with water, estimates that she drinks about 1 cup of water.  She drinks very little other fluids in general.  She reports full compliance with her CPAP machine.  She reports stress, she takes care of of her daughter and helps financially, she has 1 granddaughter who she supports through  college.  She also takes care of all of her younger brother who has dyslexia.  She helped raise his kids in the past as well.  She has been in physical therapy in [place].  Her father died in 62 War II, mom lived to be in her late 14s.  She does not feel that she has numbness or tingling or burning in her feet.  Her most recent A1c was less than 7 as she recalls.  She drinks alcohol on special occasions.  She drinks 1 cup of coffee in the morning, unsweetened tea about 2 glasses/day, restarted smoking about a month ago and smokes about 5 or 6 cigarettes/day.  She was previously able to quit smoking cold [place] she reports.  I reviewed your office note from 05/17/2021.  She has been in physical therapy for back pain.  She had a recent lumbar spine MRI without contrast on 05/05/2021 and I reviewed the results: IMPRESSION: 1. 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 2. Mild multilevel lumbar spondylosis without foraminal or canal stenosis at any level. She had blood work through your office on 02/08/2021 and I was able to review the results: Glucose was 136, creatinine 2.1, BUN 27, otherwise benign findings, WBC was elevated at 13.91, neutrophils mildly elevated at 10.  Lipid profile showed total cholesterol of 150, triglycerides 191, HDL 39, LDL 73, apolipoprotein B was mildly elevated at 96, TSH normal at 2.28, A1c on 02/15/2021 was 8.2.  Vitamin D level on 02/15/2021 was 68.2.  She was seen by orthopedics, Dr. [name] on 04/10/2021 and I was able to review his note.  She reported knee pain at the time as well as lower back stiffness.  She was found to have mild arthritic changes in the right hip and x-ray of the lower back showed pseudo spondylolisthesis of L5 on S1.  He advised her to take Tylenol arthritis as she cannot take anti-inflammatory medication secondary to kidney disease and cannot take prednisone secondary to her diabetes.  She was advised to follow-up as needed.  She  has a history of jaw tremor and hand tremors.  Her brother has tremors as well.  Her Past Medical History Is Significant For: Past Medical History:  Diagnosis Date   Aortic valve stenosis, mild    per echo 02-13-2016  possible bicuspid w/ moderate thickened and calicified ,  valve area 1.34cm^2    pt denies   COPD (chronic obstructive pulmonary disease) (HCC)    GERD (gastroesophageal reflux disease)    no problems since started on CPAP   Hematuria    History of adenomatous polyp of colon    tubular adnenoma's and hyperplastic polyp's 05-19-2002;  08-15-2010;  2015   History of kidney stones    multiple since 1970's   History of squamous cell carcinoma excision    05-14-2012  right arm;  04-18-2015  left ankle   HTN (hypertension)    Hyperlipidemia    Iron deficiency anemia    Left ureteral stone    OSA on CPAP    per study 10-13-2006 mild to moderate osa w/ hypersomnia   Seasonal and perennial allergic rhinitis    Type 2 diabetes mellitus ([place])    type 2   Urgency of urination     Her Past Surgical History Is Significant For: Past Surgical History:  Procedure Laterality Date   BIOPSY  03/29/2020   Procedure: BIOPSY;  Surgeon: Clarene Essex, MD;  Location: [facility];  Service: Endoscopy;;   CARPAL TUNNEL RELEASE Bilateral 2003   COLONOSCOPY  last one 2015   CT CHEST WITH CONTRAST   ([place] [facility])     Advanced aortic and branch vessel atherosclerosis. Tortuous thoracic aorta. Normal heart size, without pericardial effusion. Multivessel coronary artery atherosclerosis   CYSTOSCOPY WITH RETROGRADE PYELOGRAM, URETEROSCOPY AND STENT PLACEMENT Left 04/15/2017   Procedure: CYSTOSCOPY WITH LEFT  RETROGRADE PYELOGRAM, URETEROSCOPYwith stone basketry;  Surgeon: Kathie Rhodes, MD;  Location: [facility];  Service: Urology;  Laterality: Left;   D & C HYSTEROSCOPY W/ RESECTION FIBROID AND POLYP  07/30/2000   DOBUTAMINE STRESS ECHO  01-30-2008   [facility]   no ischemia/  normal wall  motion/  post stress ef 79%   ESOPHAGOGASTRODUODENOSCOPY (EGD) WITH PROPOFOL N/A 01/28/2018   Procedure: ESOPHAGOGASTRODUODENOSCOPY (EGD) WITH PROPOFOL;  Surgeon: Clarene Essex, MD;  Location: [facility];  Service: Endoscopy;  Laterality: N/A;   ESOPHAGOGASTRODUODENOSCOPY (EGD) WITH PROPOFOL N/A 08/19/2019   Procedure: ESOPHAGOGASTRODUODENOSCOPY (EGD) WITH PROPOFOL;  Surgeon: Clarene Essex, MD;  Location: [facility];  Service: Endoscopy;  Laterality: N/A;   ESOPHAGOGASTRODUODENOSCOPY (EGD) WITH PROPOFOL N/A 03/29/2020   Procedure: ESOPHAGOGASTRODUODENOSCOPY (EGD) WITH PROPOFOL;  Surgeon: Clarene Essex, MD;  Location: [facility];  Service: Endoscopy;  Laterality: N/A;   FACIAL FRACTURE SURGERY  1955   trauma    HEMOSTASIS CLIP PLACEMENT  08/19/2019   Procedure: HEMOSTASIS CLIP PLACEMENT;  Surgeon: Clarene Essex, MD;  Location: [facility];  Service: Endoscopy;;   HOT HEMOSTASIS  N/A 01/28/2018   Procedure: HOT HEMOSTASIS (ARGON PLASMA COAGULATION/BICAP);  Surgeon: Clarene Essex, MD;  Location: [facility];  Service: Endoscopy;  Laterality: N/A;   LAPAROSCOPIC CHOLECYSTECTOMY  08/20/2001   w/ ERCP spincterotomy w/ balloon   NM MYOVIEW LTD  7/'19; 6/'21   LOW RISK.  EF > 65%. No Ischemia or Infarction. Normal Study: Hyperdynamic.   POLYPECTOMY  08/19/2019   Procedure: POLYPECTOMY;  Surgeon: Clarene Essex, MD;  Location: [facility];  Service: Endoscopy;;   TRANSTHORACIC ECHOCARDIOGRAM  02/13/2016   LVEF 55-60%. Gr 1 DD. ? possible bicuspid AoV w/ moderate thickened / calcified AoV w/ mild Stenosis (Mean Gradietn 15 mmHg, Valve area 1.34cm^2), no regurg.     TRANSTHORACIC ECHOCARDIOGRAM  06/2018   EF 55-65%. Gr 1 DD. Mild AS (? Functionally bicuspid AoV with moderate leaflet thickening) - Mean Gradient 15 mmHg, Peak 32 mmHg   TUBAL LIGATION Bilateral yrs ago    Her Family History Is Significant For: Family History  Problem Relation Age of Onset   Alzheimer's disease Mother    Colon cancer Paternal  Grandfather    Heart attack Paternal Grandfather    Brain cancer Paternal Grandmother    Heart disease Paternal Grandmother        not sure of details   Heart disease Maternal Grandmother        pacemaker, heart attack   Alzheimer's disease Maternal Grandmother    Colon cancer Paternal Uncle    Colon cancer Paternal Uncle    Colon cancer Paternal Aunt    Heart attack Maternal Uncle    Lung cancer Maternal Uncle    Hyperlipidemia Brother    Hypertension Brother     Her Social History Is Significant For: Social History   Socioeconomic History   Marital status: Divorced    Spouse name: Not on file   Number of children: Not on file   Years of education: Not on file   Highest education level: Not on file  Occupational History   Occupation: retired Engineer, water  Tobacco Use   Smoking status: Former    Packs/day: 0.25    Years: 50.00    Pack years: 12.50    Types: Cigarettes    Quit date: 12/01/2015    Years since quitting: 5.5   Smokeless tobacco: Never  Vaping Use   Vaping Use: Never used  Substance and Sexual Activity   Alcohol use: Yes    Alcohol/week: 0.0 standard drinks    Comment: rare   Drug use: No   Sexual activity: Not Currently  Other Topics Concern   Not on file  Social History Narrative   Divorced   Children: 1 daughter & 1 Grand-daughter (that lives with her - freshman @ [employer])   2 yrs Secretary/administrator; 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   Caffeine: 1 drink/day exercise routine to because of back pain.   Social Determinants of Health   Financial Resource Strain: Not on file  Food Insecurity: Not on file  Transportation Needs: Not on file  Physical Activity: Not on file  Stress: Not on file  Social Connections: Not on file    Her Allergies Are:  Allergies  Allergen Reactions   Metformin And Related     Caused kidney issues  :   Her Current Medications Are:  Outpatient  Encounter Medications as of 06/13/2021  Medication Sig   albuterol (VENTOLIN HFA) 108 (  90 Base) MCG/ACT inhaler Inhale 2 puffs into the lungs every 6 (six) hours as needed for wheezing or shortness of breath.   ANORO ELLIPTA 62.5-25 MCG/INH AEPB Inhale 1 puff into the lungs daily.   aspirin EC 81 MG tablet Take 81 mg by mouth at bedtime.   Carboxymethylcellul-Glycerin (LUBRICATING EYE DROPS OP) Place 1 drop into both eyes 3 (three) times daily as needed (dry eyes).    Cholecalciferol (VITAMIN D-3) 125 MCG (5000 UT) TABS Take 5,000 Units by mouth See admin instructions. Take 1 tablet (5000 units) by mouth every evening, except Fridays.   diclofenac Sodium (VOLTAREN) 1 % GEL Apply 2 g topically 4 (four) times daily.   diltiazem (CARDIZEM CD) 240 MG 24 hr capsule Take 240 mg by mouth daily.   escitalopram (LEXAPRO) 10 MG tablet Take 10 mg by mouth every morning.    ferrous sulfate 325 (65 FE) MG tablet Take 325 mg by mouth daily with breakfast.   fluticasone (FLONASE) 50 MCG/ACT nasal spray Place 2 sprays into both nostrils daily.   icosapent Ethyl (VASCEPA) 1 g capsule Take 2 capsules by mouth 2 (two) times daily.    insulin aspart (NOVOLOG FLEXPEN) 100 UNIT/ML FlexPen Inject 8 Units into the skin 3 (three) times daily with meals.   insulin detemir (LEVEMIR) 100 UNIT/ML injection Inject 40 Units into the skin daily.    losartan (COZAAR) 100 MG tablet Take 1 tablet (100 mg total) by mouth daily.   rosuvastatin (CRESTOR) 10 MG tablet Take 10 mg by mouth at bedtime.   traMADol (ULTRAM) 50 MG tablet Take 50 mg by mouth every 6 (six) hours as needed.   TRULICITY 1.5 NG/2.9BM SOPN Inject 1.5 mg into the skin every Wednesday.    vitamin B-12 (CYANOCOBALAMIN) 500 MCG tablet Take 500 mcg by mouth daily.   Vitamin D, Ergocalciferol, (DRISDOL) 50000 units CAPS capsule Take 50,000 Units by mouth every Friday.    zinc gluconate 50 MG tablet Take 50 mg by mouth daily.   No facility-administered encounter  medications on file as of 06/13/2021.  :   Review of Systems:  Out of a complete 14 point review of systems, all are reviewed and negative with the exception of these symptoms as listed below:  Review of Systems  Neurological:        Here for consult on unsteady gait. Reports 2 falls over the last several months. Most recent fall was 2 weeks ago. Reports weakness/numbness in the left thigh and feels like these sx have attributed to the recent falls. She does ambulate some with a cane and is participating in physical therapy to help strength her gait.    Objective:  Neurological Exam  Physical Exam Physical Examination:   Vitals:   06/13/21 0829  BP: 120/66  Pulse: 61    General Examination: The patient is a very pleasant 80 y.o. female in no acute distress. She appears well-developed and well-nourished and well groomed.   HEENT: Normocephalic, atraumatic, pupils are equal, round and reactive to light and accommodation.  She is status post bilateral cataract repairs, tracking is well-preserved, hearing grossly intact, face is symmetric with normal facial animation, she has a scar which is vertical all between her right nostril and her right upper lip.  She also brings a picture of her face shortly after she fell and it shows extensive swelling and bruising.  Face is symmetric without facial masking, speech is clear with no evidence of dysarthria or hypophonia.  Tongue protrudes centrally  and palate elevates symmetrically, mild mouth dryness noted.  No carotid bruits.  Neck is supple with full range of motion.  She has a intermittent lower lip and jaw tremor.  She has a history of tremor affecting primarily her head but also her hands.    Chest: Clear to auscultation without wheezing, rhonchi or crackles noted.  Heart: S1+S2+0, regular and normal without murmurs, rubs or gallops noted.   Abdomen: Soft, non-tender and non-distended with normal bowel sounds appreciated on  auscultation.  Extremities: There is no pitting edema in the distal lower extremities bilaterally. Pedal pulses are intact.  Skin: Warm and dry without trophic changes noted.  Musculoskeletal: exam reveals no obvious joint deformities, tenderness or joint swelling or erythema.   Neurologically:  Mental status: The patient is awake, alert and oriented in all 4 spheres. Her immediate and remote memory, attention, language skills and fund of knowledge are appropriate. There is no evidence of aphasia, agnosia, apraxia or anomia. Speech is clear with normal prosody and enunciation. Thought process is linear. Mood is normal and affect is normal.  Cranial nerves II - XII are as described above under HEENT exam. In addition: shoulder shrug is normal with equal shoulder height noted. Motor exam: Normal bulk, strength and tone is noted. There is a very mild bilateral upper extremity postural and action tremor, no resting tremor, no drift.  Romberg is not tested for safety concerns, reflexes are 1+ in the upper extremities and absent in the lower extremities.  Babinski: Toes are flexor bilaterally. Fine motor skills and coordination: intact with normal finger taps, normal hand movements, normal rapid alternating patting, normal foot taps and normal foot agility.  Cerebellar testing: No dysmetria or intention tremor on finger to nose testing. Heel to shin is unremarkable bilaterally. There is no truncal or gait ataxia.  Sensory exam: intact to light touch, vibration, temperature sense in the upper and lower extremities.  Gait, station and balance: She stands with mild difficulty, requires no assistance, she walks slightly in securely and slowly but without evidence of shuffling and with preserved arm swing noted.  She turns slowly and cautiously.  Left leg may be slightly longer compared to right leg based on hip height.  Assessment and Plan:   In summary, Shianne T Ehresman is a very pleasant 80 y.o.-year old  female ith an underlying complex medical history of aortic stenosis, COPD, nephrolithiasis, CKD, aortic valve stenosis, squamous cell carcinoma, hypertension, hyperlipidemia, hematuria, reflux disease, right leg pain, anemia, seasonal allergies, diabetes, and obesity, who presents for evaluation of her gait disorder and balance problems, recent falls.  She has experienced right thigh pain and numbness, also intermittent weakness.  History and description of her symptoms are more in keeping with meralgia paresthetica rather than radiculopathy or neuropathy.  Examination does not support peripheral neuropathy although she may be at risk for diabetic neuropathy.  She does not have any telltale parkinsonism and fall risk and recent issues with gait may be multifactorial in etiology.  I explained to her that she may be at risk for falls due to a combination of factors which include arthritis affecting the lower back and hip, possibility of neuropathy, recent falls with injury, having a tremor, medication side effect from Lyrica recently, and suboptimal hydration.  She is advised to increase her water intake and limit her caffeine consumption.  She is advised to stand up slowly, consider using a cane for safety.  She is furthermore advised to be consistent with her CPAP  machine and to work on smoking cessation.  We talked about the importance of healthy lifestyle.  She had a head injury recently but did not see medical attention.  She is strongly advised to seek immediate medical attention if she were to fall and hurt herself.  She is agreeable to further testing through our office, I would like to proceed with an EMG and nerve conduction velocity test of her right lower extremity.  We will also proceed with a brain MRI, we will do this without contrast given her chronic kidney disease.  She is agreeable to testing.  We will keep her posted as to her test results by phone call for now and plan a follow-up in this clinic  as needed.  She is advised to follow-up with you as scheduled.  I answered all her questions today and she was in agreement with our plan.   Thank you very much for allowing me to participate in the care of this nice patient. If I can be of any further assistance to you please do not hesitate to call me at [phone].  Sincerely,   Star Age, MD, PhD

## 2021-06-14 ENCOUNTER — Other Ambulatory Visit: Payer: Self-pay

## 2021-06-14 ENCOUNTER — Ambulatory Visit: Payer: Medicare Other | Admitting: Physical Therapy

## 2021-06-14 DIAGNOSIS — M5441 Lumbago with sciatica, right side: Secondary | ICD-10-CM | POA: Diagnosis not present

## 2021-06-14 DIAGNOSIS — R293 Abnormal posture: Secondary | ICD-10-CM | POA: Diagnosis not present

## 2021-06-14 DIAGNOSIS — G8929 Other chronic pain: Secondary | ICD-10-CM

## 2021-06-14 NOTE — Therapy (Signed)
[facility] [place], [place], [address] Phone: [phone]   Fax:  [phone]  Physical Therapy Treatment  Patient Details  Name: [name] [name] MRN: [id number] Date of Birth: December 12, 1941 Referring Provider (PT): Marton Redwood MD   Encounter Date: 06/14/2021   PT End of Session - 06/14/21 1121     Visit Number 5    Number of Visits 12    Date for PT Re-Evaluation 08/31/21    Authorization Type FOTO AT LEAST EVERY 5TH VISIT.  PROGRESS NOTE AT 10TH VISIT.  KX MODIFIER AFTER 15 VISITS.    PT Start Time 1115    PT Stop Time 1202    PT Time Calculation (min) 47 min    Activity Tolerance Patient tolerated treatment well    Behavior During Therapy [facility] for tasks assessed/performed             Past Medical History:  Diagnosis Date   Aortic valve stenosis, mild    per echo 02-13-2016  possible bicuspid w/ moderate thickened and calicified ,  valve area 1.34cm^2    pt denies   COPD (chronic obstructive pulmonary disease) ([place])    GERD (gastroesophageal reflux disease)    no problems since started on CPAP   Hematuria    History of adenomatous polyp of colon    tubular adnenoma's and hyperplastic polyp's 05-19-2002;  08-15-2010;  2015   History of kidney stones    multiple since 1970's   History of squamous cell carcinoma excision    05-14-2012  right arm;  04-18-2015  left ankle   HTN (hypertension)    Hyperlipidemia    Iron deficiency anemia    Left ureteral stone    OSA on CPAP    per study 10-13-2006 mild to moderate osa w/ hypersomnia   Seasonal and perennial allergic rhinitis    Type 2 diabetes mellitus ([place])    type 2   Urgency of urination     Past Surgical History:  Procedure Laterality Date   BIOPSY  03/29/2020   Procedure: BIOPSY;  Surgeon: Clarene Essex, MD;  Location: [facility];  Service: Endoscopy;;   CARPAL TUNNEL RELEASE Bilateral 2003   COLONOSCOPY  last one 2015   CT CHEST WITH CONTRAST    ([place] HX)     Advanced aortic and branch vessel atherosclerosis. Tortuous thoracic aorta. Normal heart size, without pericardial effusion. Multivessel coronary artery atherosclerosis   CYSTOSCOPY WITH RETROGRADE PYELOGRAM, URETEROSCOPY AND STENT PLACEMENT Left 04/15/2017   Procedure: CYSTOSCOPY WITH LEFT  RETROGRADE PYELOGRAM, URETEROSCOPYwith stone basketry;  Surgeon: Kathie Rhodes, MD;  Location: [facility];  Service: Urology;  Laterality: Left;   D & C HYSTEROSCOPY W/ RESECTION FIBROID AND POLYP  07/30/2000   DOBUTAMINE STRESS ECHO  01-30-2008   [facility]   no ischemia/  normal wall motion/  post stress ef 79%   ESOPHAGOGASTRODUODENOSCOPY (EGD) WITH PROPOFOL N/A 01/28/2018   Procedure: ESOPHAGOGASTRODUODENOSCOPY (EGD) WITH PROPOFOL;  Surgeon: Clarene Essex, MD;  Location: [facility];  Service: Endoscopy;  Laterality: N/A;   ESOPHAGOGASTRODUODENOSCOPY (EGD) WITH PROPOFOL N/A 08/19/2019   Procedure: ESOPHAGOGASTRODUODENOSCOPY (EGD) WITH PROPOFOL;  Surgeon: Clarene Essex, MD;  [facility]: [facility];  Service: Endoscopy;  Laterality: N/A;   ESOPHAGOGASTRODUODENOSCOPY (EGD) WITH PROPOFOL N/A 03/29/2020   Procedure: ESOPHAGOGASTRODUODENOSCOPY (EGD) WITH PROPOFOL;  Surgeon: Clarene Essex, MD;  Location: [facility];  Service: Endoscopy;  Laterality: N/A;   FACIAL FRACTURE SURGERY  1955   trauma    HEMOSTASIS CLIP PLACEMENT  08/19/2019   Procedure: HEMOSTASIS CLIP PLACEMENT;  Surgeon: Clarene Essex, MD;  [facility]: [facility];  Service: Endoscopy;;   HOT HEMOSTASIS N/A 01/28/2018   Procedure: HOT HEMOSTASIS (ARGON PLASMA COAGULATION/BICAP);  Surgeon: Clarene Essex, MD;  Location: [facility];  Service: Endoscopy;  Laterality: N/A;   LAPAROSCOPIC CHOLECYSTECTOMY  08/20/2001   w/ ERCP spincterotomy w/ balloon   NM MYOVIEW LTD  7/'19; 6/'21   LOW RISK.  EF > 65%. No Ischemia or Infarction. Normal Study: Hyperdynamic.   POLYPECTOMY  08/19/2019   Procedure: POLYPECTOMY;  Surgeon: Clarene Essex, MD;   Location: [facility];  Service: Endoscopy;;   TRANSTHORACIC ECHOCARDIOGRAM  02/13/2016   LVEF 55-60%. Gr 1 DD. ? possible bicuspid AoV w/ moderate thickened / calcified AoV w/ mild Stenosis (Mean Gradietn 15 mmHg, Valve area 1.34cm^2), no regurg.     TRANSTHORACIC ECHOCARDIOGRAM  06/2018   EF 55-65%. Gr 1 DD. Mild AS (? Functionally bicuspid AoV with moderate leaflet thickening) - Mean Gradient 15 mmHg, Peak 32 mmHg   TUBAL LIGATION Bilateral yrs ago    There were no vitals filed for this visit.   Subjective Assessment - 06/14/21 1119     Subjective COVID-19 screen performed prior to patient entering clinic. Patient reported some ongoing pain.    Pertinent History COPD, GERD, bilateral CTS, DM,.    How long can you sit comfortably? Varies.    How long can you stand comfortably? Varies.    How long can you walk comfortably? Short community distance with cane.    Diagnostic tests MRI,    Patient Stated Goals Reduce pain and get around better and not have legs give way.    Currently in Pain? Yes    Pain Score 2     Pain Location Back    Pain Orientation Right    Pain Descriptors / Indicators Discomfort    Pain Type Chronic pain    Pain Radiating Towards right LE numbness down to knee    Pain Onset More than a month ago    Pain Frequency Constant    Aggravating Factors  transition movements    Pain Relieving Factors upright posture                               [facility] Adult PT Treatment/Exercise - 06/14/21 0001       Lumbar Exercises: Stretches   Single Knee to Chest Stretch Right;3 reps;30 seconds      Lumbar Exercises: Aerobic   Nustep L3 x23min UE/LE activity, monitored for progression      Lumbar Exercises: Supine   Clam 20 reps;3 seconds    Clam Limitations red band for progression    Bent Knee Raise 3 seconds   2 x10   Bent Knee Raise Limitations red band for progression    Bridge 20 reps    Straight Leg Raise 3 seconds   2x20   Other Supine  Lumbar Exercises grey ball squeeze x20    Other Supine Lumbar Exercises reverse muscle technique on RT LE x5      Traction   Type of Traction Lumbar    Min (lbs) 5    Max (lbs) 75    Hold Time 99    Rest Time 5    Time 15                         PT Long Term Goals -  06/12/21 1126       PT LONG TERM GOAL #1   Title Independent with an HEP.    Time 6    Period Weeks    Status On-going      PT LONG TERM GOAL #2   Title Eliminate right thigh numbness.    Baseline ongoing symptoms 06/12/21    Time 6    Period Weeks    Status On-going      PT LONG TERM GOAL #3   Title Stand in erect posture.    Baseline foward flexed posture 06/12/21    Time 6    Period Weeks    Status On-going      PT LONG TERM GOAL #4   Title Perform ADL's with pain not > 3-4/10.    Baseline 5-7/10 pain with ADL's 06/12/21    Time 6    Period Weeks    Status On-going                   Plan - 06/14/21 1132     Clinical Impression Statement Patient tolerated treament well today. Less pain reported from 7/10 to 2/10 yet ongoing numbness symptoms in LE. Today added bands for exercise progression to improve strength. Patient continues to have limitations with ADL's and prolong activity due to pain. Goals progressing this week. Good response to mechanical traction today.    Personal Factors and Comorbidities Comorbidity 1;Comorbidity 2;Other    Comorbidities COPD, GERD, bilateral CTS, DM,.    Examination-Activity Limitations Other;Locomotion Level    Examination-Participation Restrictions Other    Rehab Potential Good    PT Frequency 2x / week    PT Duration 6 weeks    PT Treatment/Interventions ADLs/Self Care Home Management;Cryotherapy;Electrical Stimulation;Ultrasound;Traction;Moist Heat;Functional mobility training;Therapeutic activities;Therapeutic exercise;Manual techniques;Patient/family education;Passive range of motion;Dry needling    PT Next Visit Plan cont with POC for  progression with traction exercise progression hip bridges Modalites and STW/M issue HEP    Consulted and Agree with Plan of Care Patient             Patient will benefit from skilled therapeutic intervention in order to improve the following deficits and impairments:  Pain, Abnormal gait, Decreased activity tolerance, Decreased mobility, Decreased range of motion, Increased muscle spasms, Postural dysfunction  Visit Diagnosis: Chronic right-sided low back pain with right-sided sciatica  Abnormal posture     Problem List Patient Active Problem List   Diagnosis Date Noted   Multiple lung nodules on CT 09/20/2019   COPD mixed type ([place]) 09/16/2018   Coronary artery calcification seen on computed tomography 06/30/2018   Precordial pain 06/30/2018   Moderate aortic stenosis by prior echocardiogram 06/30/2018   Anemia 01/17/2017   Depression 01/17/2017   Thyroid nodule 01/17/2017   Kidney stone 01/17/2017   Osteopenia 01/17/2017   Hyperlipidemia associated with type 2 diabetes mellitus ([place]) 01/17/2017   Leukocytosis 01/17/2017   Adenomatous colon polyp 01/17/2017   Dyspnea 07/20/2011   DIABETES, TYPE 2 07/19/2010   Essential hypertension 07/19/2010   Seasonal and perennial allergic rhinitis 07/19/2010   SIALADENITIS, RIGHT 07/19/2010   G E R D 07/19/2010   Obstructive sleep apnea 06/06/2010    Sven Pinheiro P, PTA 06/14/2021, 12:11 PM  [facility] [address] [place], [place], [address] Phone: [phone]   Fax:  [phone]  Name: REECE MCBROOM MRN: [id number] Date of Birth: 1941/10/17

## 2021-06-19 ENCOUNTER — Ambulatory Visit: Payer: Medicare Other | Admitting: Physical Therapy

## 2021-06-19 ENCOUNTER — Other Ambulatory Visit: Payer: Self-pay

## 2021-06-19 DIAGNOSIS — R293 Abnormal posture: Secondary | ICD-10-CM

## 2021-06-19 DIAGNOSIS — G8929 Other chronic pain: Secondary | ICD-10-CM | POA: Diagnosis not present

## 2021-06-19 DIAGNOSIS — M5441 Lumbago with sciatica, right side: Secondary | ICD-10-CM | POA: Diagnosis not present

## 2021-06-19 NOTE — Therapy (Signed)
[facility] [place], [place], [address] Phone: [phone]   Fax:  [phone]  Physical Therapy Treatment  Patient Details  Name: [name] [name] MRN: [id number] Date of Birth: [birth year] Referring Provider (PT): Marton Redwood MD   Encounter Date: 06/19/2021   PT End of Session - 06/19/21 1123     Visit Number 6    Number of Visits 12    Date for PT Re-Evaluation 08/31/21    Authorization Type FOTO AT LEAST EVERY 5TH VISIT.  PROGRESS NOTE AT 10TH VISIT.  KX MODIFIER AFTER 15 VISITS.    PT Start Time 1115    PT Stop Time 1201    PT Time Calculation (min) 46 min    Activity Tolerance Patient tolerated treatment well    Behavior During Therapy [facility] for tasks assessed/performed             Past Medical History:  Diagnosis Date   Aortic valve stenosis, mild    per echo 02-13-2016  possible bicuspid w/ moderate thickened and calicified ,  valve area 1.34cm^2    pt denies   COPD (chronic obstructive pulmonary disease) ([place])    GERD (gastroesophageal reflux disease)    no problems since started on CPAP   Hematuria    History of adenomatous polyp of colon    tubular adnenoma's and hyperplastic polyp's 05-19-2002;  08-15-2010;  2015   History of kidney stones    multiple since 1970's   History of squamous cell carcinoma excision    05-14-2012  right arm;  04-18-2015  left ankle   HTN (hypertension)    Hyperlipidemia    Iron deficiency anemia    Left ureteral stone    OSA on CPAP    per study 10-13-2006 mild to moderate osa w/ hypersomnia   Seasonal and perennial allergic rhinitis    Type 2 diabetes mellitus ([place])    type 2   Urgency of urination     Past Surgical History:  Procedure Laterality Date   BIOPSY  03/29/2020   Procedure: BIOPSY;  Surgeon: Clarene Essex, MD;  [facility]: [facility];  Service: Endoscopy;;   CARPAL TUNNEL RELEASE Bilateral 2003   COLONOSCOPY  last one 2015   CT CHEST WITH CONTRAST    ([place] HX)     Advanced aortic and branch vessel atherosclerosis. Tortuous thoracic aorta. Normal heart size, without pericardial effusion. Multivessel coronary artery atherosclerosis   CYSTOSCOPY WITH RETROGRADE PYELOGRAM, URETEROSCOPY AND STENT PLACEMENT Left 04/15/2017   Procedure: CYSTOSCOPY WITH LEFT  RETROGRADE PYELOGRAM, URETEROSCOPYwith stone basketry;  Surgeon: Kathie Rhodes, MD;  Location: [facility]  Service: Urology;  Laterality: Left;   D & C HYSTEROSCOPY W/ RESECTION FIBROID AND POLYP  07/30/2000   DOBUTAMINE STRESS ECHO  01-30-2008   [facility]   no ischemia/  normal wall motion/  post stress ef 79%   ESOPHAGOGASTRODUODENOSCOPY (EGD) WITH PROPOFOL N/A 01/28/2018   Procedure: ESOPHAGOGASTRODUODENOSCOPY (EGD) WITH PROPOFOL;  Surgeon: Clarene Essex, MD;  [facility]: [facility];  Service: Endoscopy;  Laterality: N/A;   ESOPHAGOGASTRODUODENOSCOPY (EGD) WITH PROPOFOL N/A 08/19/2019   Procedure: ESOPHAGOGASTRODUODENOSCOPY (EGD) WITH PROPOFOL;  Surgeon: Clarene Essex, MD;  [facility]: [facility];  Service: Endoscopy;  Laterality: N/A;   ESOPHAGOGASTRODUODENOSCOPY (EGD) WITH PROPOFOL N/A 03/29/2020   Procedure: ESOPHAGOGASTRODUODENOSCOPY (EGD) WITH PROPOFOL;  Surgeon: Clarene Essex, MD;  [facility]: [facility];  Service: Endoscopy;  Laterality: N/A;   FACIAL FRACTURE SURGERY  1955   trauma    HEMOSTASIS CLIP PLACEMENT  08/19/2019   Procedure: HEMOSTASIS CLIP PLACEMENT;  Surgeon: Clarene Essex, MD;  [facility]: [facility];  Service: Endoscopy;;   HOT HEMOSTASIS N/A 01/28/2018   Procedure: HOT HEMOSTASIS (ARGON PLASMA COAGULATION/BICAP);  Surgeon: Clarene Essex, MD;  Location: [facility];  Service: Endoscopy;  Laterality: N/A;   LAPAROSCOPIC CHOLECYSTECTOMY  08/20/2001   w/ ERCP spincterotomy w/ balloon   NM MYOVIEW LTD  7/'19; 6/'21   LOW RISK.  EF > 65%. No Ischemia or Infarction. Normal Study: Hyperdynamic.   POLYPECTOMY  08/19/2019   Procedure: POLYPECTOMY;  Surgeon: Clarene Essex, MD;   [facility]: [facility];  Service: Endoscopy;;   TRANSTHORACIC ECHOCARDIOGRAM  02/13/2016   LVEF 55-60%. Gr 1 DD. ? possible bicuspid AoV w/ moderate thickened / calcified AoV w/ mild Stenosis (Mean Gradietn 15 mmHg, Valve area 1.34cm^2), no regurg.     TRANSTHORACIC ECHOCARDIOGRAM  06/2018   EF 55-65%. Gr 1 DD. Mild AS (? Functionally bicuspid AoV with moderate leaflet thickening) - Mean Gradient 15 mmHg, Peak 32 mmHg   TUBAL LIGATION Bilateral yrs ago    There were no vitals filed for this visit.   Subjective Assessment - 06/19/21 1122     Subjective COVID-19 screen performed prior to patient entering clinic. Patient reported less pain yet ongoing numbness in LE    Pertinent History COPD, GERD, bilateral CTS, DM,.    How long can you sit comfortably? Varies.    How long can you stand comfortably? Varies.    How long can you walk comfortably? Short community distance with cane.    Diagnostic tests MRI,    Patient Stated Goals Reduce pain and get around better and not have legs give way.    Currently in Pain? Yes    Pain Score 2     Pain Location Back    Pain Orientation Right    Pain Descriptors / Indicators Discomfort;Numbness    Pain Type Chronic pain    Pain Onset More than a month ago    Pain Frequency Constant    Aggravating Factors  transition movements    Pain Relieving Factors upright posture                               [facility] Adult PT Treatment/Exercise - 06/19/21 0001       Lumbar Exercises: Aerobic   Nustep L3 x12 min UE/LE activity, monitored for progression      Lumbar Exercises: Standing   Shoulder Extension Strengthening;Both;20 reps    Shoulder Extension Limitations blue XTS      Lumbar Exercises: Supine   Clam 20 reps;3 seconds    Clam Limitations red band for progression    Bent Knee Raise 3 seconds   2x10   Bent Knee Raise Limitations red band    Bridge 20 reps    Straight Leg Raise 3 seconds   2x10     Traction   Type of  Traction Lumbar    Min (lbs) 5    Max (lbs) 80    Hold Time 99    Rest Time 5    Time 15                         PT Long Term Goals - 06/19/21 1125       PT LONG TERM GOAL #1   Title Independent with an HEP.    Time 6    Period Weeks  Status On-going      PT LONG TERM GOAL #2   Title Eliminate right thigh numbness.    Baseline no change in symptoms 06/19/21    Time 6    Period Weeks      PT LONG TERM GOAL #3   Title Stand in erect posture.    Baseline foward flexed posture 06/19/21    Time 6    Period Weeks    Status On-going      PT LONG TERM GOAL #4   Title Perform ADL's with pain not > 3-4/10.    Baseline 5/10 pain with ADL's 06/19/21    Time 6    Period Weeks    Status On-going                   Plan - 06/19/21 1143     Clinical Impression Statement Patient tolerated treatment well and feels overall less discomfort. Patient has continued to respond well to traction each visit. Patient continues to stand and walk with forward flexed posture and pain level at 5/10 with ADL's and activity. LE numbess remains the same. goals progressing.    Personal Factors and Comorbidities Comorbidity 1;Comorbidity 2;Other    Comorbidities COPD, GERD, bilateral CTS, DM,.    Examination-Activity Limitations Other;Locomotion Level    Examination-Participation Restrictions Other    Stability/Clinical Decision Making Evolving/Moderate complexity    Rehab Potential Good    PT Frequency 2x / week    PT Treatment/Interventions ADLs/Self Care Home Management;Cryotherapy;Electrical Stimulation;Ultrasound;Traction;Moist Heat;Functional mobility training;Therapeutic activities;Therapeutic exercise;Manual techniques;Patient/family education;Passive range of motion;Dry needling    PT Next Visit Plan cont with POC for progression with traction exercise progression hip bridges Modalites and STW/M issue HEP    Consulted and Agree with Plan of Care Patient              Patient will benefit from skilled therapeutic intervention in order to improve the following deficits and impairments:  Pain, Abnormal gait, Decreased activity tolerance, Decreased mobility, Decreased range of motion, Increased muscle spasms, Postural dysfunction  Visit Diagnosis: Chronic right-sided low back pain with right-sided sciatica  Abnormal posture     Problem List Patient Active Problem List   Diagnosis Date Noted   Multiple lung nodules on CT 09/20/2019   COPD mixed type ([place]) 09/16/2018   Coronary artery calcification seen on computed tomography 06/30/2018   Precordial pain 06/30/2018   Moderate aortic stenosis by prior echocardiogram 06/30/2018   Anemia 01/17/2017   Depression 01/17/2017   Thyroid nodule 01/17/2017   Kidney stone 01/17/2017   Osteopenia 01/17/2017   Hyperlipidemia associated with type 2 diabetes mellitus ([place]) 01/17/2017   Leukocytosis 01/17/2017   Adenomatous colon polyp 01/17/2017   Dyspnea 07/20/2011   DIABETES, TYPE 2 07/19/2010   Essential hypertension 07/19/2010   Seasonal and perennial allergic rhinitis 07/19/2010   SIALADENITIS, RIGHT 07/19/2010   G E R D 07/19/2010   Obstructive sleep apnea 06/06/2010    Jasminne Mealy P, PTA 06/19/2021, 12:06 PM  [facility] [place], [place], [address] Phone: [phone]   Fax:  [phone]  Name: [name] [name] MRN: [id number] Date of Birth: 1941/07/18

## 2021-06-21 ENCOUNTER — Ambulatory Visit: Payer: Medicare Other | Admitting: Physical Therapy

## 2021-06-21 ENCOUNTER — Other Ambulatory Visit: Payer: Self-pay

## 2021-06-21 DIAGNOSIS — R293 Abnormal posture: Secondary | ICD-10-CM

## 2021-06-21 DIAGNOSIS — G8929 Other chronic pain: Secondary | ICD-10-CM | POA: Diagnosis not present

## 2021-06-21 DIAGNOSIS — M5441 Lumbago with sciatica, right side: Secondary | ICD-10-CM | POA: Diagnosis not present

## 2021-06-21 NOTE — Therapy (Signed)
[facility] [place], [place], [address] Phone: [phone]   Fax:  [phone]  Physical Therapy Treatment  Patient Details  Name: [name] [name] MRN: [id number] Date of Birth: [birth year] Referring Provider (PT): Marton Redwood MD   Encounter Date: 06/21/2021   PT End of Session - 06/21/21 1335     Visit Number 7    Number of Visits 12    Date for PT Re-Evaluation 08/31/21    Authorization Type FOTO AT LEAST EVERY 5TH VISIT.  PROGRESS NOTE AT 10TH VISIT.  KX MODIFIER AFTER 15 VISITS.    PT Start Time 0100    PT Stop Time 0152    PT Time Calculation (min) 52 min    Activity Tolerance Patient tolerated treatment well    Behavior During Therapy [facility] for tasks assessed/performed             Past Medical History:  Diagnosis Date   Aortic valve stenosis, mild    per echo 02-13-2016  possible bicuspid w/ moderate thickened and calicified ,  valve area 1.34cm^2    pt denies   COPD (chronic obstructive pulmonary disease) (HCC)    GERD (gastroesophageal reflux disease)    no problems since started on CPAP   Hematuria    History of adenomatous polyp of colon    tubular adnenoma's and hyperplastic polyp's 05-19-2002;  08-15-2010;  2015   History of kidney stones    multiple since 1970's   History of squamous cell carcinoma excision    05-14-2012  right arm;  04-18-2015  left ankle   HTN (hypertension)    Hyperlipidemia    Iron deficiency anemia    Left ureteral stone    OSA on CPAP    per study 10-13-2006 mild to moderate osa w/ hypersomnia   Seasonal and perennial allergic rhinitis    Type 2 diabetes mellitus ([place])    type 2   Urgency of urination     Past Surgical History:  Procedure Laterality Date   BIOPSY  03/29/2020   Procedure: BIOPSY;  Surgeon: Clarene Essex, MD;  Location: [facility];  Service: Endoscopy;;   CARPAL TUNNEL RELEASE Bilateral 2003   COLONOSCOPY  last one 2015   CT CHEST WITH CONTRAST    ([place] HX)     Advanced aortic and branch vessel atherosclerosis. Tortuous thoracic aorta. Normal heart size, without pericardial effusion. Multivessel coronary artery atherosclerosis   CYSTOSCOPY WITH RETROGRADE PYELOGRAM, URETEROSCOPY AND STENT PLACEMENT Left 04/15/2017   Procedure: CYSTOSCOPY WITH LEFT  RETROGRADE PYELOGRAM, URETEROSCOPYwith stone basketry;  Surgeon: Kathie Rhodes, MD;  Location: [facility];  Service: Urology;  Laterality: Left;   D & C HYSTEROSCOPY W/ RESECTION FIBROID AND POLYP  07/30/2000   DOBUTAMINE STRESS ECHO  01-30-2008   [facility]   no ischemia/  normal wall motion/  post stress ef 79%   ESOPHAGOGASTRODUODENOSCOPY (EGD) WITH PROPOFOL N/A 01/28/2018   Procedure: ESOPHAGOGASTRODUODENOSCOPY (EGD) WITH PROPOFOL;  Surgeon: Clarene Essex, MD;  [facility]: [facility];  Service: Endoscopy;  Laterality: N/A;   ESOPHAGOGASTRODUODENOSCOPY (EGD) WITH PROPOFOL N/A 08/19/2019   Procedure: ESOPHAGOGASTRODUODENOSCOPY (EGD) WITH PROPOFOL;  Surgeon: Clarene Essex, MD;  [facility]: [facility];  Service: Endoscopy;  Laterality: N/A;   ESOPHAGOGASTRODUODENOSCOPY (EGD) WITH PROPOFOL N/A 03/29/2020   Procedure: ESOPHAGOGASTRODUODENOSCOPY (EGD) WITH PROPOFOL;  Surgeon: Clarene Essex, MD;  [facility]: [facility];  Service: Endoscopy;  Laterality: N/A;   FACIAL FRACTURE SURGERY  1955   trauma    HEMOSTASIS CLIP PLACEMENT  08/19/2019   Procedure: HEMOSTASIS CLIP PLACEMENT;  Surgeon: Clarene Essex, MD;  [facility]: [facility];  Service: Endoscopy;;   HOT HEMOSTASIS N/A 01/28/2018   Procedure: HOT HEMOSTASIS (ARGON PLASMA COAGULATION/BICAP);  Surgeon: Clarene Essex, MD;  Location: [facility];  Service: Endoscopy;  Laterality: N/A;   LAPAROSCOPIC CHOLECYSTECTOMY  08/20/2001   w/ ERCP spincterotomy w/ balloon   NM MYOVIEW LTD  7/'19; 6/'21   LOW RISK.  EF > 65%. No Ischemia or Infarction. Normal Study: Hyperdynamic.   POLYPECTOMY  08/19/2019   Procedure: POLYPECTOMY;  Surgeon: Clarene Essex, MD;   [facility]: [facility];  Service: Endoscopy;;   TRANSTHORACIC ECHOCARDIOGRAM  02/13/2016   LVEF 55-60%. Gr 1 DD. ? possible bicuspid AoV w/ moderate thickened / calcified AoV w/ mild Stenosis (Mean Gradietn 15 mmHg, Valve area 1.34cm^2), no regurg.     TRANSTHORACIC ECHOCARDIOGRAM  06/2018   EF 55-65%. Gr 1 DD. Mild AS (? Functionally bicuspid AoV with moderate leaflet thickening) - Mean Gradient 15 mmHg, Peak 32 mmHg   TUBAL LIGATION Bilateral yrs ago    There were no vitals filed for this visit.   Subjective Assessment - 06/21/21 1335     Subjective COVID-19 screen performed prior to patient entering clinic.  Doing good today.  Pain at a 3/10.    Pertinent History COPD, GERD, bilateral CTS, DM,.    How long can you sit comfortably? Varies.    How long can you stand comfortably? Varies.    How long can you walk comfortably? Short community distance with cane.    Diagnostic tests MRI,    Patient Stated Goals Reduce pain and get around better and not have legs give way.    Currently in Pain? Yes    Pain Score 3     Pain Location Back    Pain Orientation Right    Pain Descriptors / Indicators Discomfort;Numbness    Pain Type Chronic pain    Pain Onset More than a month ago                               [facility] Adult PT Treatment/Exercise - 06/21/21 0001       Exercises   Exercises Knee/Hip      Lumbar Exercises: Aerobic   Nustep Level 3 x 15 minutes.      Traction   Type of Traction Lumbar    Min (lbs) 5    Max (lbs) 85    Hold Time 99    Rest Time 5    Time 15      Manual Therapy   Manual Therapy Soft tissue mobilization    Soft tissue mobilization Left sdly position with pillow betwen knees for comfort:  STW/M x 8 minutes to patient's right SIJ and upper gluteal region to deactivate TP's.  Utilized ischemic release technique as well.                         PT Long Term Goals - 06/19/21 1125       PT LONG TERM GOAL #1   Title  Independent with an HEP.    Time 6    Period Weeks    Status On-going      PT LONG TERM GOAL #2   Title Eliminate right thigh numbness.    Baseline no change in symptoms 06/19/21    Time 6    Period Weeks  PT LONG TERM GOAL #3   Title Stand in erect posture.    Baseline foward flexed posture 06/19/21    Time 6    Period Weeks    Status On-going      PT LONG TERM GOAL #4   Title Perform ADL's with pain not > 3-4/10.    Baseline 5/10 pain with ADL's 06/19/21    Time 6    Period Weeks    Status On-going                   Plan - 06/21/21 1339     Clinical Impression Statement Patient feels treatments are helping.  She was found to have triggers points in her right upper gluteal region today.  She responded well to STW/M.  Increased intermittment traction by 5#.  Patient finding traction helpful.    Personal Factors and Comorbidities Comorbidity 1;Comorbidity 2;Other    Comorbidities COPD, GERD, bilateral CTS, DM,.    Examination-Activity Limitations Other;Locomotion Level    Examination-Participation Restrictions Other    Stability/Clinical Decision Making Evolving/Moderate complexity    Rehab Potential Good    PT Frequency 2x / week    PT Duration 6 weeks    PT Treatment/Interventions ADLs/Self Care Home Management;Cryotherapy;Electrical Stimulation;Ultrasound;Traction;Moist Heat;Functional mobility training;Therapeutic activities;Therapeutic exercise;Manual techniques;Patient/family education;Passive range of motion;Dry needling    PT Next Visit Plan Intermittment lumbar traction to 90#.    Consulted and Agree with Plan of Care Patient             Patient will benefit from skilled therapeutic intervention in order to improve the following deficits and impairments:  Pain, Abnormal gait, Decreased activity tolerance, Decreased mobility, Decreased range of motion, Increased muscle spasms, Postural dysfunction  Visit Diagnosis: Chronic right-sided low back pain  with right-sided sciatica  Abnormal posture     Problem List Patient Active Problem List   Diagnosis Date Noted   Multiple lung nodules on CT 09/20/2019   COPD mixed type ([place]) 09/16/2018   Coronary artery calcification seen on computed tomography 06/30/2018   Precordial pain 06/30/2018   Moderate aortic stenosis by prior echocardiogram 06/30/2018   Anemia 01/17/2017   Depression 01/17/2017   Thyroid nodule 01/17/2017   Kidney stone 01/17/2017   Osteopenia 01/17/2017   Hyperlipidemia associated with type 2 diabetes mellitus ([place]) 01/17/2017   Leukocytosis 01/17/2017   Adenomatous colon polyp 01/17/2017   Dyspnea 07/20/2011   DIABETES, TYPE 2 07/19/2010   Essential hypertension 07/19/2010   Seasonal and perennial allergic rhinitis 07/19/2010   SIALADENITIS, RIGHT 07/19/2010   G E R D 07/19/2010   Obstructive sleep apnea 06/06/2010    Lashann Hagg, [place] MPT 06/21/2021, 1:53 PM  [facility] [address] [place], [place], [address] Phone: [phone]   Fax:  [phone]  Name: SORINA DERRIG MRN: [id number] Date of Birth: [birth year]

## 2021-06-24 ENCOUNTER — Ambulatory Visit
Admission: RE | Admit: 2021-06-24 | Discharge: 2021-06-24 | Disposition: A | Discharge status: Home / Self Care | Payer: Medicare Other | Source: Ambulatory Visit | Attending: Neurology | Admitting: Neurology

## 2021-06-24 ENCOUNTER — Other Ambulatory Visit: Payer: Self-pay

## 2021-06-24 DIAGNOSIS — R2689 Other abnormalities of gait and mobility: Secondary | ICD-10-CM

## 2021-06-24 DIAGNOSIS — R269 Unspecified abnormalities of gait and mobility: Secondary | ICD-10-CM

## 2021-06-24 DIAGNOSIS — G5711 Meralgia paresthetica, right lower limb: Secondary | ICD-10-CM

## 2021-06-24 DIAGNOSIS — R29898 Other symptoms and signs involving the musculoskeletal system: Secondary | ICD-10-CM

## 2021-06-24 DIAGNOSIS — W19XXXS Unspecified fall, sequela: Secondary | ICD-10-CM

## 2021-06-24 DIAGNOSIS — G250 Essential tremor: Secondary | ICD-10-CM | POA: Diagnosis not present

## 2021-06-26 ENCOUNTER — Ambulatory Visit: Payer: Medicare Other | Admitting: Physical Therapy

## 2021-06-26 ENCOUNTER — Telehealth: Payer: Self-pay | Admitting: Hematology

## 2021-06-26 ENCOUNTER — Telehealth: Payer: Self-pay | Admitting: Neurology

## 2021-06-26 ENCOUNTER — Other Ambulatory Visit: Payer: Self-pay

## 2021-06-26 ENCOUNTER — Ambulatory Visit (HOSPITAL_COMMUNITY)
Admission: RE | Admit: 2021-06-26 | Discharge: 2021-06-26 | Disposition: A | Discharge status: Home / Self Care | Payer: Medicare Other | Source: Ambulatory Visit | Attending: Cardiology | Admitting: Cardiology

## 2021-06-26 DIAGNOSIS — M5441 Lumbago with sciatica, right side: Secondary | ICD-10-CM | POA: Diagnosis not present

## 2021-06-26 DIAGNOSIS — G8929 Other chronic pain: Secondary | ICD-10-CM | POA: Diagnosis not present

## 2021-06-26 DIAGNOSIS — R293 Abnormal posture: Secondary | ICD-10-CM | POA: Diagnosis not present

## 2021-06-26 DIAGNOSIS — I350 Nonrheumatic aortic (valve) stenosis: Secondary | ICD-10-CM | POA: Insufficient documentation

## 2021-06-26 HISTORY — PX: TRANSTHORACIC ECHOCARDIOGRAM: SHX275

## 2021-06-26 LAB — ECHOCARDIOGRAM COMPLETE
AR max vel: 1.13 cm2
AV Area VTI: 1.35 cm2
AV Area mean vel: 1.25 cm2
AV Mean grad: 21.3 mmHg
AV Peak grad: 41.9 mmHg
Ao pk vel: 3.24 m/s
Area-P 1/2: 2.19 cm2
MV VTI: 1.65 cm2
S' Lateral: 2.05 cm

## 2021-06-26 NOTE — Telephone Encounter (Signed)
Pt advised of results and verbalized understanding. 6 month f/u appt was scheduled and pt was advised we would call with results of EMG/NCS once available

## 2021-06-26 NOTE — Therapy (Signed)
[facility] [place], [place], [address] Phone: [phone]   Fax:  [phone]  Physical Therapy Treatment  Patient Details  Name: TIJUANA SCHEIDEGGER MRN: [id number] Date of Birth: 1941-05-17 Referring Provider (PT): Marton Redwood MD   Encounter Date: 06/26/2021   PT End of Session - 06/26/21 1423     Visit Number 8    Number of Visits 12    Date for PT Re-Evaluation 08/31/21    Authorization Type FOTO AT LEAST EVERY 5TH VISIT.  PROGRESS NOTE AT 10TH VISIT.  KX MODIFIER AFTER 15 VISITS.    PT Start Time 0145    PT Stop Time 2725    PT Time Calculation (min) 58 min    Activity Tolerance Patient tolerated treatment well    Behavior During Therapy [facility] for tasks assessed/performed             Past Medical History:  Diagnosis Date   Aortic valve stenosis, mild    per echo 02-13-2016  possible bicuspid w/ moderate thickened and calicified ,  valve area 1.34cm^2    pt denies   COPD (chronic obstructive pulmonary disease) ([place])    GERD (gastroesophageal reflux disease)    no problems since started on CPAP   Hematuria    History of adenomatous polyp of colon    tubular adnenoma's and hyperplastic polyp's 05-19-2002;  08-15-2010;  2015   History of kidney stones    multiple since 1970's   History of squamous cell carcinoma excision    05-14-2012  right arm;  04-18-2015  left ankle   HTN (hypertension)    Hyperlipidemia    Iron deficiency anemia    Left ureteral stone    OSA on CPAP    per study 10-13-2006 mild to moderate osa w/ hypersomnia   Seasonal and perennial allergic rhinitis    Type 2 diabetes mellitus ([place])    type 2   Urgency of urination     Past Surgical History:  Procedure Laterality Date   BIOPSY  03/29/2020   Procedure: BIOPSY;  Surgeon: Clarene Essex, MD;  [facility]: [facility];  Service: Endoscopy;;   CARPAL TUNNEL RELEASE Bilateral 2003   COLONOSCOPY  last one 2015   CT CHEST WITH CONTRAST    ([place] HX)     Advanced aortic and branch vessel atherosclerosis. Tortuous thoracic aorta. Normal heart size, without pericardial effusion. Multivessel coronary artery atherosclerosis   CYSTOSCOPY WITH RETROGRADE PYELOGRAM, URETEROSCOPY AND STENT PLACEMENT Left 04/15/2017   Procedure: CYSTOSCOPY WITH LEFT  RETROGRADE PYELOGRAM, URETEROSCOPYwith stone basketry;  Surgeon: Kathie Rhodes, MD;  Location: [facility];  Service: Urology;  Laterality: Left;   D & C HYSTEROSCOPY W/ RESECTION FIBROID AND POLYP  07/30/2000   DOBUTAMINE STRESS ECHO  01-30-2008   [facility]   no ischemia/  normal wall motion/  post stress ef 79%   ESOPHAGOGASTRODUODENOSCOPY (EGD) WITH PROPOFOL N/A 01/28/2018   Procedure: ESOPHAGOGASTRODUODENOSCOPY (EGD) WITH PROPOFOL;  Surgeon: Clarene Essex, MD;  [facility]: [facility];  Service: Endoscopy;  Laterality: N/A;   ESOPHAGOGASTRODUODENOSCOPY (EGD) WITH PROPOFOL N/A 08/19/2019   Procedure: ESOPHAGOGASTRODUODENOSCOPY (EGD) WITH PROPOFOL;  Surgeon: Clarene Essex, MD;  [facility]: [facility];  Service: Endoscopy;  Laterality: N/A;   ESOPHAGOGASTRODUODENOSCOPY (EGD) WITH PROPOFOL N/A 03/29/2020   Procedure: ESOPHAGOGASTRODUODENOSCOPY (EGD) WITH PROPOFOL;  Surgeon: Clarene Essex, MD;  [facility]: [facility];  Service: Endoscopy;  Laterality: N/A;   FACIAL FRACTURE SURGERY  1955   trauma    HEMOSTASIS CLIP PLACEMENT  08/19/2019   Procedure: HEMOSTASIS CLIP PLACEMENT;  Surgeon: Clarene Essex, MD;  [facility]: [facility];  Service: Endoscopy;;   HOT HEMOSTASIS N/A 01/28/2018   Procedure: HOT HEMOSTASIS (ARGON PLASMA COAGULATION/BICAP);  Surgeon: Clarene Essex, MD;  Location: [facility];  Service: Endoscopy;  Laterality: N/A;   LAPAROSCOPIC CHOLECYSTECTOMY  08/20/2001   w/ ERCP spincterotomy w/ balloon   NM MYOVIEW LTD  7/'19; 6/'21   LOW RISK.  EF > 65%. No Ischemia or Infarction. Normal Study: Hyperdynamic.   POLYPECTOMY  08/19/2019   Procedure: POLYPECTOMY;  Surgeon: Clarene Essex, MD;   [facility]: [facility];  Service: Endoscopy;;   TRANSTHORACIC ECHOCARDIOGRAM  02/13/2016   LVEF 55-60%. Gr 1 DD. ? possible bicuspid AoV w/ moderate thickened / calcified AoV w/ mild Stenosis (Mean Gradietn 15 mmHg, Valve area 1.34cm^2), no regurg.     TRANSTHORACIC ECHOCARDIOGRAM  06/2018   EF 55-65%. Gr 1 DD. Mild AS (? Functionally bicuspid AoV with moderate leaflet thickening) - Mean Gradient 15 mmHg, Peak 32 mmHg   TUBAL LIGATION Bilateral yrs ago    There were no vitals filed for this visit.   Subjective Assessment - 06/26/21 1416     Subjective COVID-19 screen performed prior to patient entering clinic.  Doing better.    Pertinent History COPD, GERD, bilateral CTS, DM,.    How long can you sit comfortably? Varies.    How long can you stand comfortably? Varies.    How long can you walk comfortably? Short community distance with cane.    Patient Stated Goals Reduce pain and get around better and not have legs give way.    Currently in Pain? Yes    Pain Score 4     Pain Orientation Right    Pain Descriptors / Indicators Discomfort    Pain Type Chronic pain    Pain Onset More than a month ago                               [facility] Adult PT Treatment/Exercise - 06/26/21 0001       Exercises   Exercises Knee/Hip      Lumbar Exercises: Aerobic   Nustep Level 3 x 15 minutes.      Traction   Type of Traction Lumbar    Min (lbs) 5    Max (lbs) 90    Hold Time 99    Rest Time 5    Time 15      Manual Therapy   Manual Therapy Soft tissue mobilization    Soft tissue mobilization STW/m x 8 minutes to patient's right LB/SIJ/upper gluteal region.                         PT Long Term Goals - 06/19/21 1125       PT LONG TERM GOAL #1   Title Independent with an HEP.    Time 6    Period Weeks    Status On-going      PT LONG TERM GOAL #2   Title Eliminate right thigh numbness.    Baseline no change in symptoms 06/19/21    Time 6     Period Weeks      PT LONG TERM GOAL #3   Title Stand in erect posture.    Baseline foward flexed posture 06/19/21    Time 6    Period Weeks    Status On-going  PT LONG TERM GOAL #4   Title Perform ADL's with pain not > 3-4/10.    Baseline 5/10 pain with ADL's 06/19/21    Time 6    Period Weeks    Status On-going                   Plan - 06/26/21 1419     Clinical Impression Statement Patient doing well with treatments.  Her gait was remarkable for walking in an erect posture today due to her pain decrease.  Tolerating traction very well.    Personal Factors and Comorbidities Comorbidity 1;Comorbidity 2;Other    Comorbidities COPD, GERD, bilateral CTS, DM,.    Examination-Activity Limitations Other;Locomotion Level    Examination-Participation Restrictions Other    Stability/Clinical Decision Making Evolving/Moderate complexity             Patient will benefit from skilled therapeutic intervention in order to improve the following deficits and impairments:  Pain, Abnormal gait, Decreased activity tolerance, Decreased mobility, Decreased range of motion, Increased muscle spasms, Postural dysfunction  Visit Diagnosis: Chronic right-sided low back pain with right-sided sciatica  Abnormal posture     Problem List Patient Active Problem List   Diagnosis Date Noted   Multiple lung nodules on CT 09/20/2019   COPD mixed type ([place]) 09/16/2018   Coronary artery calcification seen on computed tomography 06/30/2018   Precordial pain 06/30/2018   Moderate aortic stenosis by prior echocardiogram 06/30/2018   Anemia 01/17/2017   Depression 01/17/2017   Thyroid nodule 01/17/2017   Kidney stone 01/17/2017   Osteopenia 01/17/2017   Hyperlipidemia associated with type 2 diabetes mellitus ([place]) 01/17/2017   Leukocytosis 01/17/2017   Adenomatous colon polyp 01/17/2017   Dyspnea 07/20/2011   DIABETES, TYPE 2 07/19/2010   Essential hypertension 07/19/2010   Seasonal  and perennial allergic rhinitis 07/19/2010   SIALADENITIS, RIGHT 07/19/2010   G E R D 07/19/2010   Obstructive sleep apnea 06/06/2010    Zohan Shiflet, [place] MPT 06/26/2021, 3:08 PM  [facility] [address] [place], [place], [address] Phone: [phone]   Fax:  [phone]  Name: GWYNNETH FABIO MRN: [id number] Date of Birth: December 30, 1941

## 2021-06-26 NOTE — Telephone Encounter (Signed)
[name] [name] has been cld and rescheduled to see Dr. Irene Limbo on 7/6 at 1pm.

## 2021-06-26 NOTE — Telephone Encounter (Signed)
Please call patient or her daughter (on [place]) regarding her recent brain MRI without contrast from 06/24/2021.  There were no acute findings.  There was a small cyst in the so-called pineal gland which is typically deemed benign.  She also had a growth on the right side consistent with a benign tumor called meningioma.  It does not press on any surrounding structures, we can recheck with a repeat brain MRI in about 6 months.  Please arrange for 61-month follow-up with myself or the nurse practitioner so we can consider repeating the MRI at the time.  We will keep her posted as to her EMG results (pending for 06/29/21) as they become available in the next few days.

## 2021-06-26 NOTE — Progress Notes (Signed)
  Echocardiogram 2D Echocardiogram has been performed.  [name] [name] 06/26/2021, 11:44 AM

## 2021-06-27 ENCOUNTER — Encounter: Payer: Medicare Other | Admitting: Hematology

## 2021-06-28 ENCOUNTER — Ambulatory Visit: Payer: Medicare Other | Admitting: Physical Therapy

## 2021-06-28 ENCOUNTER — Other Ambulatory Visit: Payer: Self-pay

## 2021-06-28 ENCOUNTER — Encounter: Payer: Self-pay | Admitting: Physical Therapy

## 2021-06-28 DIAGNOSIS — M5441 Lumbago with sciatica, right side: Secondary | ICD-10-CM | POA: Diagnosis not present

## 2021-06-28 DIAGNOSIS — G8929 Other chronic pain: Secondary | ICD-10-CM | POA: Diagnosis not present

## 2021-06-28 DIAGNOSIS — R293 Abnormal posture: Secondary | ICD-10-CM

## 2021-06-28 NOTE — Therapy (Signed)
[facility] [place], [place], [address] Phone: [phone]   Fax:  [phone]  Physical Therapy Treatment  Patient Details  Name: [name] [name] MRN: [id number] Date of Birth: 31-Mar-1941 Referring Provider (PT): Marton Redwood MD   Encounter Date: 06/28/2021   PT End of Session - 06/28/21 1129     Visit Number 9    Number of Visits 12    Date for PT Re-Evaluation 08/31/21    Authorization Type FOTO AT LEAST EVERY 5TH VISIT.  PROGRESS NOTE AT 10TH VISIT.  KX MODIFIER AFTER 15 VISITS.    PT Start Time 1120    PT Stop Time 1218    PT Time Calculation (min) 58 min    Activity Tolerance Patient tolerated treatment well    Behavior During Therapy WFL for tasks assessed/performed             Past Medical History:  Diagnosis Date   Aortic valve stenosis, mild    per echo 02-13-2016  possible bicuspid w/ moderate thickened and calicified ,  valve area 1.34cm^2    pt denies   COPD (chronic obstructive pulmonary disease) ([place])    GERD (gastroesophageal reflux disease)    no problems since started on CPAP   Hematuria    History of adenomatous polyp of colon    tubular adnenoma's and hyperplastic polyp's 05-19-2002;  08-15-2010;  2015   History of kidney stones    multiple since 1970's   History of squamous cell carcinoma excision    05-14-2012  right arm;  04-18-2015  left ankle   HTN (hypertension)    Hyperlipidemia    Iron deficiency anemia    Left ureteral stone    OSA on CPAP    per study 10-13-2006 mild to moderate osa w/ hypersomnia   Seasonal and perennial allergic rhinitis    Type 2 diabetes mellitus ([place])    type 2   Urgency of urination     Past Surgical History:  Procedure Laterality Date   BIOPSY  03/29/2020   Procedure: BIOPSY;  Surgeon: Clarene Essex, MD;  Location: [facility];  Service: Endoscopy;;   CARPAL TUNNEL RELEASE Bilateral 2003   COLONOSCOPY  last one 2015   CT CHEST WITH CONTRAST    ([place] [facility])     Advanced aortic and branch vessel atherosclerosis. Tortuous thoracic aorta. Normal heart size, without pericardial effusion. Multivessel coronary artery atherosclerosis   CYSTOSCOPY WITH RETROGRADE PYELOGRAM, URETEROSCOPY AND STENT PLACEMENT Left 04/15/2017   Procedure: CYSTOSCOPY WITH LEFT  RETROGRADE PYELOGRAM, URETEROSCOPYwith stone basketry;  Surgeon: Kathie Rhodes, MD;  Location: [facility];  Service: Urology;  Laterality: Left;   D & C HYSTEROSCOPY W/ RESECTION FIBROID AND POLYP  07/30/2000   DOBUTAMINE STRESS ECHO  01-30-2008   [facility]   no ischemia/  normal wall motion/  post stress ef 79%   ESOPHAGOGASTRODUODENOSCOPY (EGD) WITH PROPOFOL N/A 01/28/2018   Procedure: ESOPHAGOGASTRODUODENOSCOPY (EGD) WITH PROPOFOL;  Surgeon: Clarene Essex, MD;  Location: [facility];  Service: Endoscopy;  Laterality: N/A;   ESOPHAGOGASTRODUODENOSCOPY (EGD) WITH PROPOFOL N/A 08/19/2019   Procedure: ESOPHAGOGASTRODUODENOSCOPY (EGD) WITH PROPOFOL;  Surgeon: Clarene Essex, MD;  Location: [facility];  Service: Endoscopy;  Laterality: N/A;   ESOPHAGOGASTRODUODENOSCOPY (EGD) WITH PROPOFOL N/A 03/29/2020   Procedure: ESOPHAGOGASTRODUODENOSCOPY (EGD) WITH PROPOFOL;  Surgeon: Clarene Essex, MD;  Location: [facility];  Service: Endoscopy;  Laterality: N/A;   FACIAL FRACTURE SURGERY  1955   trauma    HEMOSTASIS CLIP PLACEMENT  08/19/2019   Procedure: HEMOSTASIS CLIP PLACEMENT;  Surgeon: Clarene Essex, MD;  Location: [facility];  Service: Endoscopy;;   HOT HEMOSTASIS N/A 01/28/2018   Procedure: HOT HEMOSTASIS (ARGON PLASMA COAGULATION/BICAP);  Surgeon: Clarene Essex, MD;  Location: [facility];  Service: Endoscopy;  Laterality: N/A;   LAPAROSCOPIC CHOLECYSTECTOMY  08/20/2001   w/ ERCP spincterotomy w/ balloon   NM MYOVIEW LTD  7/'19; 6/'21   LOW RISK.  EF > 65%. No Ischemia or Infarction. Normal Study: Hyperdynamic.   POLYPECTOMY  08/19/2019   Procedure: POLYPECTOMY;  Surgeon: Clarene Essex, MD;   Location: [facility];  Service: Endoscopy;;   TRANSTHORACIC ECHOCARDIOGRAM  02/13/2016   LVEF 55-60%. Gr 1 DD. ? possible bicuspid AoV w/ moderate thickened / calcified AoV w/ mild Stenosis (Mean Gradietn 15 mmHg, Valve area 1.34cm^2), no regurg.     TRANSTHORACIC ECHOCARDIOGRAM  06/2018   EF 55-65%. Gr 1 DD. Mild AS (? Functionally bicuspid AoV with moderate leaflet thickening) - Mean Gradient 15 mmHg, Peak 32 mmHg   TUBAL LIGATION Bilateral yrs ago    There were no vitals filed for this visit.   Subjective Assessment - 06/28/21 1121     Subjective COVID-19 screen performed prior to patient entering clinic. Reports that traction does help but still getting pain down RLE. Supposed to be having an xray of the R hip.    Pertinent History COPD, GERD, bilateral CTS, DM,.    How long can you sit comfortably? Varies.    How long can you stand comfortably? Varies.    How long can you walk comfortably? Short community distance with cane.    Diagnostic tests MRI,    Patient Stated Goals Reduce pain and get around better and not have legs give way.    Currently in Pain? Yes    Pain Score 4     Pain Location Leg    Pain Orientation Right    Pain Descriptors / Indicators Discomfort    Pain Type Chronic pain    Pain Onset More than a month ago    Pain Frequency Constant                               [facility] Adult PT Treatment/Exercise - 06/28/21 0001       Lumbar Exercises: Aerobic   Nustep Level 3 x 17 minutes.      Traction   Type of Traction Lumbar    Min (lbs) 5    Max (lbs) 90    Hold Time 99    Rest Time 5    Time 15      Manual Therapy   Manual Therapy Soft tissue mobilization    Soft tissue mobilization STW of R lumbar paraspinals, SI joint, glute to reduce tone and soreness                         PT Long Term Goals - 06/19/21 1125       PT LONG TERM GOAL #1   Title Independent with an HEP.    Time 6    Period Weeks    Status  On-going      PT LONG TERM GOAL #2   Title Eliminate right thigh numbness.    Baseline no change in symptoms 06/19/21    Time 6    Period Weeks      PT LONG TERM GOAL #3   Title  Stand in erect posture.    Baseline foward flexed posture 06/19/21    Time 6    Period Weeks    Status On-going      PT LONG TERM GOAL #4   Title Perform ADL's with pain not > 3-4/10.    Baseline 5/10 pain with ADL's 06/19/21    Time 6    Period Weeks    Status On-going                   Plan - 06/28/21 1224     Clinical Impression Statement Patient presented with continued RLE radicular pain. Patient does feel relief with mechanical traction. Increased tone palpable in R lumbar paraspinals and glute. Patient is also palpably tender to R SI joint. Mechanical lumbar traction maintained at 90# max. No complaints following end of treatment.    Personal Factors and Comorbidities Comorbidity 1;Comorbidity 2;Other    Comorbidities COPD, GERD, bilateral CTS, DM,.    Examination-Activity Limitations Other;Locomotion Level    Examination-Participation Restrictions Other    Stability/Clinical Decision Making Evolving/Moderate complexity    Rehab Potential Good    PT Frequency 2x / week    PT Duration 6 weeks    PT Treatment/Interventions ADLs/Self Care Home Management;Cryotherapy;Electrical Stimulation;Ultrasound;Traction;Moist Heat;Functional mobility training;Therapeutic activities;Therapeutic exercise;Manual techniques;Patient/family education;Passive range of motion;Dry needling    PT Next Visit Plan Intermittment lumbar traction to 90#.    Consulted and Agree with Plan of Care Patient             Patient will benefit from skilled therapeutic intervention in order to improve the following deficits and impairments:  Pain, Abnormal gait, Decreased activity tolerance, Decreased mobility, Decreased range of motion, Increased muscle spasms, Postural dysfunction  Visit Diagnosis: Chronic right-sided  low back pain with right-sided sciatica  Abnormal posture     Problem List Patient Active Problem List   Diagnosis Date Noted   Multiple lung nodules on CT 09/20/2019   COPD mixed type ([place]) 09/16/2018   Coronary artery calcification seen on computed tomography 06/30/2018   Precordial pain 06/30/2018   Moderate aortic stenosis by prior echocardiogram 06/30/2018   Anemia 01/17/2017   Depression 01/17/2017   Thyroid nodule 01/17/2017   Kidney stone 01/17/2017   Osteopenia 01/17/2017   Hyperlipidemia associated with type 2 diabetes mellitus ([place]) 01/17/2017   Leukocytosis 01/17/2017   Adenomatous colon polyp 01/17/2017   Dyspnea 07/20/2011   DIABETES, TYPE 2 07/19/2010   Essential hypertension 07/19/2010   Seasonal and perennial allergic rhinitis 07/19/2010   SIALADENITIS, RIGHT 07/19/2010   G E R D 07/19/2010   Obstructive sleep apnea 06/06/2010    Standley Brooking, PTA 06/28/2021, 12:26 PM  [facility] [address] [place], [place], [address] Phone: [phone]   Fax:  [phone]  Name: [name] [name] MRN: [id number] Date of Birth: 1941-08-17

## 2021-06-29 ENCOUNTER — Ambulatory Visit (INDEPENDENT_AMBULATORY_CARE_PROVIDER_SITE_OTHER): Payer: Medicare Other | Admitting: Diagnostic Neuroimaging

## 2021-06-29 ENCOUNTER — Encounter (INDEPENDENT_AMBULATORY_CARE_PROVIDER_SITE_OTHER): Payer: Medicare Other | Admitting: Diagnostic Neuroimaging

## 2021-06-29 DIAGNOSIS — R200 Anesthesia of skin: Secondary | ICD-10-CM | POA: Diagnosis not present

## 2021-06-29 DIAGNOSIS — W19XXXS Unspecified fall, sequela: Secondary | ICD-10-CM

## 2021-06-29 DIAGNOSIS — R269 Unspecified abnormalities of gait and mobility: Secondary | ICD-10-CM

## 2021-06-29 DIAGNOSIS — Z0289 Encounter for other administrative examinations: Secondary | ICD-10-CM

## 2021-06-29 DIAGNOSIS — R2689 Other abnormalities of gait and mobility: Secondary | ICD-10-CM

## 2021-06-29 DIAGNOSIS — R29898 Other symptoms and signs involving the musculoskeletal system: Secondary | ICD-10-CM

## 2021-06-29 DIAGNOSIS — G5711 Meralgia paresthetica, right lower limb: Secondary | ICD-10-CM

## 2021-06-29 DIAGNOSIS — G250 Essential tremor: Secondary | ICD-10-CM

## 2021-06-29 NOTE — Procedures (Signed)
[facility]  NCS (NERVE CONDUCTION STUDY) WITH EMG (ELECTROMYOGRAPHY) REPORT   STUDY DATE: 06/29/21 PATIENT NAME: [name] [name] DOB: 1941-04-28 MRN: [id number]  ORDERING CLINICIAN: Star Age, MD PhD   TECHNOLOGIST: Sherre Scarlet ELECTROMYOGRAPHER: Earlean Polka. Steed Kanaan, MD  CLINICAL INFORMATION: 80 year old female with right leg numbness.  FINDINGS: NERVE CONDUCTION STUDY:  Bilateral peroneal and tibial motor responses are normal.  Bilateral sural and superficial peroneal sensory responses are normal.  Bilateral tibial F wave latencies are normal.   NEEDLE ELECTROMYOGRAPHY:  Needle examination of right lower extremity vastus medialis, tibialis anterior, gastrocnemius is normal.    IMPRESSION:   Normal study.  No electrodiagnostic evidence of large fiber neuropathy this time.    INTERPRETING PHYSICIAN:  Penni Bombard, MD Certified in Neurology, Neurophysiology and Neuroimaging  [facility] [address], [place], [place] [address] [phone]   [facility]    Nerve / Sites Muscle Latency Ref. Amplitude Ref. Rel Amp Segments Distance Velocity Ref. Area    ms ms mV mV %  cm m/s m/s mVms  R Peroneal - EDB     Ankle EDB 4.6 ?6.5 2.3 ?2.0 100 Ankle - EDB 9   7.5     Fib head EDB 9.9  2.4  102 Fib head - Ankle 27 50 ?44 7.1     Pop fossa EDB 12.1  2.3  97.7 Pop fossa - Fib head 10 47 ?44 7.0         Pop fossa - Ankle      L Peroneal - EDB     Ankle EDB 3.8 ?6.5 2.0 ?2.0 100 Ankle - EDB 9   6.6     Fib head EDB 10.2  1.7  86.5 Fib head - Ankle 28 44 ?44 5.7     Pop fossa EDB 12.5  1.7  99.3 Pop fossa - Fib head 10 44 ?44 5.2         Pop fossa - Ankle      R Tibial - AH     Ankle AH 3.1 ?5.8 8.5 ?4.0 100 Ankle - AH 9   15.7     Pop fossa AH 11.6  5.3  61.8 Pop fossa - Ankle 38 45 ?41 12.6  L Tibial - AH     Ankle AH 3.5 ?5.8 6.5 ?4.0 100 Ankle - AH 9   13.2     Pop fossa AH 12.3  4.2  64.4 Pop fossa - Ankle 38 43  ?41 11.1             SNC    Nerve / Sites Rec. Site Peak Lat Ref.  Amp Ref. Segments Distance    ms ms V V  cm  R Sural - Ankle (Calf)     Calf Ankle 3.0 ?4.4 6 ?6 Calf - Ankle 14  L Sural - Ankle (Calf)     Calf Ankle 2.9 ?4.4 7 ?6 Calf - Ankle 14  R Superficial peroneal - Ankle     Lat leg Ankle 3.2 ?4.4 6 ?6 Lat leg - Ankle 14  L Superficial peroneal - Ankle     Lat leg Ankle 3.5 ?4.4 6 ?6 Lat leg - Ankle 14             F  Wave    Nerve F Lat Ref.   ms ms  R Tibial - AH 50.6 ?56.0  L Tibial - AH 50.2 ?56.0  EMG Summary Table    Spontaneous MUAP Recruitment  Muscle IA Fib PSW Fasc Other Amp Dur. Poly Pattern  R. Vastus medialis Normal None None None _______ Normal Normal Normal Normal  R. Tibialis anterior Normal None None None _______ Normal Normal Normal Normal  R. Gastrocnemius (Medial head) Normal None None None _______ Normal Normal Normal Normal

## 2021-07-04 NOTE — Progress Notes (Signed)
HEMATOLOGY/ONCOLOGY CONSULTATION NOTE  Date of Service: 07/05/2021  Patient Care Team: Ginger Organ., MD as PCP - General (Internal Medicine) Leonie Man, MD as PCP - Cardiology (Cardiology)  CHIEF COMPLAINTS/PURPOSE OF CONSULTATION:  Chronic Leukocytosis  HISTORY OF PRESENTING ILLNESS:   [name] [name] is a wonderful 80 y.o. female who has been referred to [place] by Dr. Sable Feil, MD for evaluation and management of leukocytosis. The pt reports that she is doing well overall.  The pt reports that she has been hearing of her elevated WBC for around a period of two years. The patient notes that she had a fall a few months ago and has since had numbness in the side of the thigh. She believes this is due to a pinched nerve. The patient notes a history of chronic back issues and related back pain. They did a nerve conduction study last week at [facility]. They also did a brain scan due to falling on her head. They found a small 1 cm tumor of the lining of the brain that was benign- likely meningioma. The patient expressed her worries with this, as she had three family members who died from a brain tumor.  The patient notes that they have removed several spots on her skin, some benign and some cancerous. She has had a nodule removed from her neck one time. She also routinely gets colonoscopies as needed.  The patient has a history of smoking on and off for around fifty years. She notes that she smokes when she is stressed or anxious, averaging 1/3 pack daily when this occurs. She notes the last time she smoke was on Father's Day this year.  Lab results 02/08/2021 of CBC w/diff and CMP is as follows: all values are WNL except for Glucose of 136, Creatinine of 2.1, BUN of 27, WBC of 13.9K, Monocytes Abs of 1.0K.  On review of systems, pt reports recent fall, recent UTI, fatigue and denies vaginal discharge, infection issues, yeast infection, sudden weight loss, fevers,  chills, night sweats, and any other symptoms.  MEDICAL HISTORY:  Past Medical History:  Diagnosis Date   Aortic valve stenosis, mild    per echo 02-13-2016  possible bicuspid w/ moderate thickened and calicified ,  valve area 1.34cm^2    pt denies   COPD (chronic obstructive pulmonary disease) (HCC)    GERD (gastroesophageal reflux disease)    no problems since started on CPAP   Hematuria    History of adenomatous polyp of colon    tubular adnenoma's and hyperplastic polyp's 05-19-2002;  08-15-2010;  2015   History of kidney stones    multiple since 1970's   History of squamous cell carcinoma excision    05-14-2012  right arm;  04-18-2015  left ankle   HTN (hypertension)    Hyperlipidemia    Iron deficiency anemia    Left ureteral stone    OSA on CPAP    per study 10-13-2006 mild to moderate osa w/ hypersomnia   Seasonal and perennial allergic rhinitis    Type 2 diabetes mellitus ([place])    type 2   Urgency of urination     SURGICAL HISTORY: Past Surgical History:  Procedure Laterality Date   BIOPSY  03/29/2020   Procedure: BIOPSY;  Surgeon: Clarene Essex, MD;  Location: [facility];  Service: Endoscopy;;   CARPAL TUNNEL RELEASE Bilateral 2003   COLONOSCOPY  last one 2015   CT CHEST WITH CONTRAST   ([facility])  Advanced aortic and branch vessel atherosclerosis. Tortuous thoracic aorta. Normal heart size, without pericardial effusion. Multivessel coronary artery atherosclerosis   CYSTOSCOPY WITH RETROGRADE PYELOGRAM, URETEROSCOPY AND STENT PLACEMENT Left 04/15/2017   Procedure: CYSTOSCOPY WITH LEFT  RETROGRADE PYELOGRAM, URETEROSCOPYwith stone basketry;  Surgeon: Kathie Rhodes, MD;  Location: [facility];  Service: Urology;  Laterality: Left;   D & C HYSTEROSCOPY W/ RESECTION FIBROID AND POLYP  07/30/2000   DOBUTAMINE STRESS ECHO  01-30-2008   [facility]   no ischemia/  normal wall motion/  post stress ef 79%   ESOPHAGOGASTRODUODENOSCOPY (EGD) WITH PROPOFOL N/A 01/28/2018    Procedure: ESOPHAGOGASTRODUODENOSCOPY (EGD) WITH PROPOFOL;  Surgeon: Clarene Essex, MD;  Location: [facility];  Service: Endoscopy;  Laterality: N/A;   ESOPHAGOGASTRODUODENOSCOPY (EGD) WITH PROPOFOL N/A 08/19/2019   Procedure: ESOPHAGOGASTRODUODENOSCOPY (EGD) WITH PROPOFOL;  Surgeon: Clarene Essex, MD;  Location: [facility];  Service: Endoscopy;  Laterality: N/A;   ESOPHAGOGASTRODUODENOSCOPY (EGD) WITH PROPOFOL N/A 03/29/2020   Procedure: ESOPHAGOGASTRODUODENOSCOPY (EGD) WITH PROPOFOL;  Surgeon: Clarene Essex, MD;  Location: [facility];  Service: Endoscopy;  Laterality: N/A;   FACIAL FRACTURE SURGERY  1955   trauma    HEMOSTASIS CLIP PLACEMENT  08/19/2019   Procedure: HEMOSTASIS CLIP PLACEMENT;  Surgeon: Clarene Essex, MD;  Location: [facility];  Service: Endoscopy;;   HOT HEMOSTASIS N/A 01/28/2018   Procedure: HOT HEMOSTASIS (ARGON PLASMA COAGULATION/BICAP);  Surgeon: Clarene Essex, MD;  Location: [facility];  Service: Endoscopy;  Laterality: N/A;   LAPAROSCOPIC CHOLECYSTECTOMY  08/20/2001   w/ ERCP spincterotomy w/ balloon   NM MYOVIEW LTD  7/'19; 6/'21   LOW RISK.  EF > 65%. No Ischemia or Infarction. Normal Study: Hyperdynamic.   POLYPECTOMY  08/19/2019   Procedure: POLYPECTOMY;  Surgeon: Clarene Essex, MD;  Location: [facility];  Service: Endoscopy;;   TRANSTHORACIC ECHOCARDIOGRAM  02/13/2016   LVEF 55-60%. Gr 1 DD. ? possible bicuspid AoV w/ moderate thickened / calcified AoV w/ mild Stenosis (Mean Gradietn 15 mmHg, Valve area 1.34cm^2), no regurg.     TRANSTHORACIC ECHOCARDIOGRAM  06/2018   EF 55-65%. Gr 1 DD. Mild AS (? Functionally bicuspid AoV with moderate leaflet thickening) - Mean Gradient 15 mmHg, Peak 32 mmHg   TUBAL LIGATION Bilateral yrs ago    SOCIAL HISTORY: Social History   Socioeconomic History   Marital status: Divorced    Spouse name: Not on file   Number of children: Not on file   Years of education: Not on file   Highest education level: Not on file   Occupational History   Occupation: retired Engineer, water  Tobacco Use   Smoking status: Former    Packs/day: 0.25    Years: 50.00    Pack years: 12.50    Types: Cigarettes    Quit date: 12/01/2015    Years since quitting: 5.5   Smokeless tobacco: Never  Vaping Use   Vaping Use: Never used  Substance and Sexual Activity   Alcohol use: Yes    Alcohol/week: 0.0 standard drinks    Comment: rare   Drug use: No   Sexual activity: Not Currently  Other Topics Concern   Not on file  Social History Narrative   Divorced   Children: 1 daughter & 1 Grand-daughter (that lives with her - freshman @ [employer])   2 yrs Secretary/administrator; 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   Caffeine: 1 drink/day exercise routine to because  of back pain.   Social Determinants of Health   Financial Resource Strain: Not on file  Food Insecurity: Not on file  Transportation Needs: Not on file  Physical Activity: Not on file  Stress: Not on file  Social Connections: Not on file  Intimate Partner Violence: Not on file    FAMILY HISTORY: Family History  Problem Relation Age of Onset   Alzheimer's disease Mother    Colon cancer Paternal Grandfather    Heart attack Paternal Grandfather    Brain cancer Paternal Grandmother    Heart disease Paternal Grandmother        not sure of details   Heart disease Maternal Grandmother        pacemaker, heart attack   Alzheimer's disease Maternal Grandmother    Colon cancer Paternal Uncle    Colon cancer Paternal Uncle    Colon cancer Paternal Aunt    Heart attack Maternal Uncle    Lung cancer Maternal Uncle    Hyperlipidemia Brother    Hypertension Brother     ALLERGIES:  is allergic to metformin and related.  MEDICATIONS:  Current Outpatient Medications  Medication Sig Dispense Refill   albuterol (VENTOLIN HFA) 108 (90 Base) MCG/ACT inhaler Inhale 2 puffs into the lungs every 6 (six) hours as  needed for wheezing or shortness of breath.     ANORO ELLIPTA 62.5-25 MCG/INH AEPB Inhale 1 puff into the lungs daily.     aspirin EC 81 MG tablet Take 81 mg by mouth at bedtime.     Carboxymethylcellul-Glycerin (LUBRICATING EYE DROPS OP) Place 1 drop into both eyes 3 (three) times daily as needed (dry eyes).      Cholecalciferol (VITAMIN D-3) 125 MCG (5000 UT) TABS Take 5,000 Units by mouth See admin instructions. Take 1 tablet (5000 units) by mouth every evening, except Fridays.     diclofenac Sodium (VOLTAREN) 1 % GEL Apply 2 g topically 4 (four) times daily. 100 g 0   diltiazem (CARDIZEM CD) 240 MG 24 hr capsule Take 240 mg by mouth daily.     escitalopram (LEXAPRO) 10 MG tablet Take 10 mg by mouth every morning.      ferrous sulfate 325 (65 FE) MG tablet Take 325 mg by mouth daily with breakfast.     fluticasone (FLONASE) 50 MCG/ACT nasal spray Place 2 sprays into both nostrils daily.     icosapent Ethyl (VASCEPA) 1 g capsule Take 2 capsules by mouth 2 (two) times daily.      insulin aspart (NOVOLOG FLEXPEN) 100 UNIT/ML FlexPen Inject 8 Units into the skin 3 (three) times daily with meals.     insulin detemir (LEVEMIR) 100 UNIT/ML injection Inject 40 Units into the skin daily.      losartan (COZAAR) 100 MG tablet Take 1 tablet (100 mg total) by mouth daily. 90 tablet 3   rosuvastatin (CRESTOR) 10 MG tablet Take 10 mg by mouth at bedtime.     traMADol (ULTRAM) 50 MG tablet Take 50 mg by mouth every 6 (six) hours as needed.     TRULICITY 1.5 TO/6.7TI SOPN Inject 1.5 mg into the skin every Wednesday.      vitamin B-12 (CYANOCOBALAMIN) 500 MCG tablet Take 500 mcg by mouth daily.     Vitamin D, Ergocalciferol, (DRISDOL) 50000 units CAPS capsule Take 50,000 Units by mouth every Friday.      zinc gluconate 50 MG tablet Take 50 mg by mouth daily.     No current facility-administered medications for this  visit.    REVIEW OF SYSTEMS:    10 Point review of Systems was done is negative except as  noted above.  PHYSICAL EXAMINATION: ECOG PERFORMANCE STATUS: 1 - Symptomatic but completely ambulatory  . Vitals:   07/05/21 1259  BP: 132/64  Pulse: 63  Resp: 18  Temp: 98.3 F (36.8 C)  SpO2: 95%   Filed Weights   07/05/21 1259  Weight: 182 lb 14.4 oz (83 kg)   .Body mass index is 32.4 kg/m.   GENERAL:alert, in no acute distress and comfortable SKIN: no acute rashes, no significant lesions EYES: conjunctiva are pink and non-injected, sclera anicteric OROPHARYNX: MMM, no exudates, no oropharyngeal erythema or ulceration NECK: supple, no JVD LYMPH:  no palpable lymphadenopathy in the cervical, axillary or inguinal regions LUNGS: clear to auscultation b/l with normal respiratory effort HEART: regular rate & rhythm ABDOMEN:  normoactive bowel sounds , non tender, not distended. Extremity: no pedal edema PSYCH: alert & oriented x 3 with fluent speech NEURO: no focal motor/sensory deficits  LABORATORY DATA:  I have reviewed the data as listed  . CBC Latest Ref Rng & Units 04/15/2017 01/17/2017  WBC 3.9 - 10.3 10e3/uL - 10.9(H)  Hemoglobin 12.0 - 15.0 g/dL 11.9(L) 11.0(L)  Hematocrit 36.0 - 46.0 % 35.0(L) 36.2  Platelets 145 - 400 10e3/uL - 350    . CMP Latest Ref Rng & Units 06/06/2020 04/15/2017 01/17/2017  Glucose 65 - 99 mg/dL 146(H) 115(H) 161(H)  BUN 8 - 27 mg/dL 27 14 12.0  Creatinine 0.57 - 1.00 mg/dL 2.06(H) 1.30(H) 1.2(H)  Sodium 134 - 144 mmol/L 142 143 143  Potassium 3.5 - 5.2 mmol/L 4.9 4.4 4.6  Chloride 96 - 106 mmol/L 104 109 -  CO2 20 - 29 mmol/L 23 - 23  Calcium 8.7 - 10.3 mg/dL 10.0 - 10.2  Total Protein 6.4 - 8.3 g/dL - - 7.7  Total Bilirubin 0.20 - 1.20 mg/dL - - 0.23  Alkaline Phos 40 - 150 U/L - - 95  AST 5 - 34 U/L - - 24  ALT 0 - 55 U/L - - 27     RADIOGRAPHIC STUDIES: I have personally reviewed the radiological images as listed and agreed with the findings in the report. MR BRAIN WO CONTRAST  Result Date: 06/25/2021  [facility] [address] [place], [place] [address] [phone] NEUROIMAGING REPORT STUDY DATE: 06/25/2019 PATIENT NAME: [name] [name] DOB: [birth year] MRN: [id number] EXAM: MRI Brain without contrast ORDERING CLINICIAN: Star Age, MD, PhD CLINICAL HISTORY: 80 year old woman with fall, gait disturbance and leg weakness COMPARISON FILMS: None TECHNIQUE: MRI of the brain without contrast was obtained utilizing 5 mm axial slices with T1, T2, T2 flair, SWI and diffusion weighted views.  T1 sagittal and T2 coronal views were obtained. CONTRAST: none IMAGING SITE: [place] [facility], [place], [place] FINDINGS: On sagittal images, the spinal cord is imaged caudally to C4 and is normal in caliber.  Degenerative changes are noted at C3-C4 and C4-C5.  The contents of the posterior fossa are of normal size and position.   There is a 3 to 4 mm cystic focus within the pituitary gland involving the pars intermedialis.  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  The optic chiasm appear normal.    There is mild generalized cortical atrophy.  There are no abnormal extra-axial collections of fluid.  In the hemispheres, there are multiple T2/FLAIR hyperintense foci in the subcortical and deep white matter.  None  of these appear to be acute.  They do not enhance.  Some foci also noted in the pons..   The deep gray matter appears normal.   Diffusion weighted images are normal.  Susceptibility weighted images are normal.  There have been bilateral lens replacements.  The orbits are otherwise normal.  Orbits appear normal.   The VIIth/VIIIth nerve complex appears normal.  The mastoid air cells appear normal.  The paranasal sinuses appear normal.  Flow voids are identified within the major intracerebral arteries.   There is a 11 x 4 mm dural based extra-axial mass near the right sylvian fissure that is hyperintense to white matter on the T1-weighted images and hyperintense on the T2 weighted  images..  This likely represents a meningioma though the study was done without contrast to confirm the diagnosis.  There is no mass-effect and adjacent brain appears normal.   This MRI of the brain without contrast shows the following: 1.   No acute findings. 2.   T2/FLAIR hyperintense foci in the hemispheres and pons consistent with mild chronic microvascular ischemic changes. 3.   Mild generalized cortical atrophy, typical for age. 4.   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 5.   11 x 4 mm dural based extra-axial mass near the right sylvian fissure consistent with a small meningioma.  There is no mass-effect. INTERPRETING PHYSICIAN: Richard A. Felecia Shelling, MD, PhD, FAAN Certified in  Neuroimaging by [employer]   NCV with EMG(electromyography)  Result Date: 06/29/2021 Penni Bombard, MD     06/29/2021  3:22 PM  [facility] NCS (NERVE CONDUCTION STUDY) WITH EMG (ELECTROMYOGRAPHY) REPORT STUDY DATE: 06/29/21 PATIENT NAME: [name] [name] DOB: [birth year] MRN: [id number] ORDERING CLINICIAN: Star Age, MD PhD TECHNOLOGIST: Sherre Scarlet ELECTROMYOGRAPHER: Earlean Polka. Penumalli, MD CLINICAL INFORMATION: 80 year old female with right leg numbness. FINDINGS: NERVE CONDUCTION STUDY: Bilateral peroneal and tibial motor responses are normal. Bilateral sural and superficial peroneal sensory responses are normal. Bilateral tibial F wave latencies are normal. NEEDLE ELECTROMYOGRAPHY: Needle examination of right lower extremity vastus medialis, tibialis anterior, gastrocnemius is normal. IMPRESSION: Normal study.  No electrodiagnostic evidence of large fiber neuropathy this time. INTERPRETING PHYSICIAN: Penni Bombard, MD Certified in Neurology, Neurophysiology and Neuroimaging [facility] [address] [place], [place] [address] [phone] [facility]   Nerve / Sites Muscle Latency Ref. Amplitude Ref. Rel Amp Segments  Distance Velocity Ref. Area   ms ms mV mV %  cm m/s m/s mVms R Peroneal - EDB    Ankle EDB 4.6 ?6.5 2.3 ?2.0 100 Ankle - EDB 9   7.5    Fib head EDB 9.9  2.4  102 Fib head - Ankle 27 50 ?44 7.1    Pop fossa EDB 12.1  2.3  97.7 Pop fossa - Fib head 10 47 ?44 7.0        Pop fossa - Ankle     L Peroneal - EDB    Ankle EDB 3.8 ?6.5 2.0 ?2.0 100 Ankle - EDB 9   6.6    Fib head EDB 10.2  1.7  86.5 Fib head - Ankle 28 44 ?44 5.7    Pop fossa EDB 12.5  1.7  99.3 Pop fossa - Fib head 10 44 ?44 5.2        Pop fossa - Ankle     R Tibial - AH    Ankle AH 3.1 ?  5.8 8.5 ?4.0 100 Ankle - AH 9   15.7    Pop fossa AH 11.6  5.3  61.8 Pop fossa - Ankle 38 45 ?41 12.6 L Tibial - AH    Ankle AH 3.5 ?5.8 6.5 ?4.0 100 Ankle - AH 9   13.2    Pop fossa AH 12.3  4.2  64.4 Pop fossa - Ankle 38 43 ?41 11.1           SNC   Nerve / Sites Rec. Site Peak Lat Ref.  Amp Ref. Segments Distance   ms ms V V  cm R Sural - Ankle (Calf)    Calf Ankle 3.0 ?4.4 6 ?6 Calf - Ankle 14 L Sural - Ankle (Calf)    Calf Ankle 2.9 ?4.4 7 ?6 Calf - Ankle 14 R Superficial peroneal - Ankle    Lat leg Ankle 3.2 ?4.4 6 ?6 Lat leg - Ankle 14 L Superficial peroneal - Ankle    Lat leg Ankle 3.5 ?4.4 6 ?6 Lat leg - Ankle 14           F  Wave   Nerve F Lat Ref.  ms ms R Tibial - AH 50.6 ?56.0 L Tibial - AH 50.2 ?56.0       EMG Summary Table   Spontaneous MUAP Recruitment Muscle IA Fib PSW Fasc Other Amp Dur. Poly Pattern R. Vastus medialis Normal None None None _______ Normal Normal Normal Normal R. Tibialis anterior Normal None None None _______ Normal Normal Normal Normal R. Gastrocnemius (Medial head) Normal None None None _______ Normal Normal Normal Normal    ECHOCARDIOGRAM COMPLETE  Result Date: 06/26/2021    ECHOCARDIOGRAM REPORT   Patient Name:   [name] [name] Date of Exam: 06/26/2021 Medical Rec #:  [id number]          Height:       63.0 in Accession #:    [id number]         Weight:       183.0 lb Date of Birth:  09/02/1941           BSA:          1.862 m  Patient Age:    44 years           BP:           125/69 mmHg Patient Gender: F                  HR:           63 bpm. Exam Location:  [facility] Procedure: 2D Echo, Cardiac Doppler and Color Doppler Indications:    Aortic Stenosis 424.1 / 135.0  History:        Patient has prior history of Echocardiogram examinations, most                 recent 06/23/2020. CAD, COPD; Risk Factors:Hypertension, Diabetes                 and Dyslipidemia.  Sonographer:    Wenda Low Referring Phys: [place]  1. Left ventricular ejection fraction, by estimation, is >75%. The left ventricle has hyperdynamic function. The left ventricle has no regional wall motion abnormalities. There is moderate left ventricular hypertrophy. Left ventricular diastolic parameters are indeterminate.  2. Right ventricular systolic function is normal. The right ventricular size is normal. Tricuspid regurgitation signal is inadequate for assessing PA pressure.  3. The mitral valve is grossly  normal. No evidence of mitral valve regurgitation.  4. The aortic valve is tricuspid. There is moderate calcification of the aortic valve. Aortic valve regurgitation is not visualized. Moderate aortic valve stenosis. Aortic valve mean gradient measures 21.3 mmHg. Aortic valve Vmax measures 3.24 m/s. Dimentionless index 0.43.  5. The inferior vena cava is normal in size with greater than 50% respiratory variability, suggesting right atrial pressure of 3 mmHg. FINDINGS  Left Ventricle: Left ventricular ejection fraction, by estimation, is >75%. The left ventricle has hyperdynamic function. The left ventricle has no regional wall motion abnormalities. The left ventricular internal cavity size was normal in size. There is moderate left ventricular hypertrophy. Left ventricular diastolic parameters are indeterminate. Right Ventricle: The right ventricular size is normal. No increase in right ventricular wall thickness. Right ventricular systolic  function is normal. Tricuspid regurgitation signal is inadequate for assessing PA pressure. Left Atrium: Left atrial size was normal in size. Right Atrium: Right atrial size was normal in size. Pericardium: There is no evidence of pericardial effusion. Presence of pericardial fat pad. Mitral Valve: The mitral valve is grossly normal. Mild to moderate mitral annular calcification. No evidence of mitral valve regurgitation. MV peak gradient, 12.7 mmHg. The mean mitral valve gradient is 4.0 mmHg. Tricuspid Valve: The tricuspid valve is grossly normal. Tricuspid valve regurgitation is trivial. Aortic Valve: The aortic valve is tricuspid. There is moderate calcification of the aortic valve. There is mild to moderate aortic valve annular calcification. Aortic valve regurgitation is not visualized. Moderate aortic stenosis is present. Aortic valve mean gradient measures 21.3 mmHg. Aortic valve peak gradient measures 41.9 mmHg. Aortic valve area, by VTI measures 1.35 cm. Pulmonic Valve: The pulmonic valve was grossly normal. Pulmonic valve regurgitation is trivial. Aorta: The aortic root is normal in size and structure. Venous: The inferior vena cava is normal in size with greater than 50% respiratory variability, suggesting right atrial pressure of 3 mmHg. IAS/Shunts: The interatrial septum appears to be lipomatous. No atrial level shunt detected by color flow Doppler.  LEFT VENTRICLE PLAX 2D LVIDd:         3.85 cm  Diastology LVIDs:         2.05 cm  LV e' medial:    5.70 cm/s LV PW:         1.38 cm  LV E/e' medial:  16.3 LV IVS:        1.36 cm  LV e' lateral:   7.53 cm/s LVOT diam:     2.00 cm  LV E/e' lateral: 12.3 LV SV:         92 LV SV Index:   50 LVOT Area:     3.14 cm  RIGHT VENTRICLE RV Basal diam:  2.92 cm RV Mid diam:    3.26 cm RV S prime:     13.80 cm/s TAPSE (M-mode): 2.3 cm LEFT ATRIUM             Index       RIGHT ATRIUM           Index LA diam:        4.30 cm 2.31 cm/m  RA Area:     10.10 cm LA Vol  (A2C):   57.5 ml 30.88 ml/m RA Volume:   15.70 ml  8.43 ml/m LA Vol (A4C):   55.1 ml 29.59 ml/m LA Biplane Vol: 57.5 ml 30.88 ml/m  AORTIC VALVE AV Area (Vmax):    1.13 cm AV Area (Vmean):   1.25 cm AV  Area (VTI):     1.35 cm AV Vmax:           323.67 cm/s AV Vmean:          205.000 cm/s AV VTI:            0.682 m AV Peak Grad:      41.9 mmHg AV Mean Grad:      21.3 mmHg LVOT Vmax:         116.00 cm/s LVOT Vmean:        81.800 cm/s LVOT VTI:          0.294 m LVOT/AV VTI ratio: 0.43  AORTA Ao Root diam: 3.10 cm Ao Asc diam:  3.10 cm MITRAL VALVE MV Area (PHT): 2.19 cm     SHUNTS MV Area VTI:   1.65 cm     Systemic VTI:  0.29 m MV Peak grad:  12.7 mmHg    Systemic Diam: 2.00 cm MV Mean grad:  4.0 mmHg MV Vmax:       1.78 m/s MV Vmean:      87.4 cm/s MV Decel Time: 347 msec MV E velocity: 92.70 cm/s MV A velocity: 122.00 cm/s MV E/A ratio:  0.76 Rozann Lesches MD Electronically signed by Rozann Lesches MD Signature Date/Time: 06/26/2021/12:59:18 PM    Final     ASSESSMENT & PLAN:   80 yo with   1) Chronic leucocytosis -- primarily neutrophilia. No significant myeloid left shift. Likely reactive due to smoking or other factors. PLAN: -Discussed leukocytosis and causes. Can be reactive or due to bone marrow issue. -Advised pt this most likely seems to be a reactive process. Could be due to fluctuations in blood sugar or history of smoking. The patient has primarily neutrophils elevated and is not acutely elevated. -Advised pt that inflammation in lungs, recent UTI, and falls could all reactively elevate her count. -Advised pt that sometimes positive ANA levels can cause inflammation if it is associated with autoimmune condition. She has never been previously diagnosed with any autoimmune disorders. -Recommended pt receive lung cancer screening with her PCP due to h/o smoking. -Will get additional labs today. -Will see back in 2 weeks via phone.   FOLLOW UP: Labs today Phone visit w Dr Irene Limbo  in 2 weeks   All of the patients questions were answered with apparent satisfaction. The patient knows to call the clinic with any problems, questions or concerns.  I spent 40 minutes counseling the patient face to face. The total time spent in the appointment was 60 minutes and more than 50% was on counseling and direct patient cares.    Sullivan Lone MD [place] AAHIVMS [facility] Hematology/Oncology Physician [facility]  (Office):       [phone] (Work cell):  [phone] (Fax):           [phone]  07/05/2021 1:55 PM   I, Reinaldo Raddle, am acting as scribe for Dr. Sullivan Lone, MD.  .I have reviewed the above documentation for accuracy and completeness, and I agree with the above. Brunetta Genera MD

## 2021-07-05 ENCOUNTER — Telehealth: Payer: Self-pay

## 2021-07-05 ENCOUNTER — Other Ambulatory Visit: Payer: Self-pay

## 2021-07-05 ENCOUNTER — Inpatient Hospital Stay: Payer: Medicare Other

## 2021-07-05 ENCOUNTER — Inpatient Hospital Stay: Payer: Medicare Other | Attending: Hematology | Admitting: Hematology

## 2021-07-05 VITALS — BP 132/64 | HR 63 | Temp 98.3°F | Resp 18 | Wt 182.9 lb

## 2021-07-05 DIAGNOSIS — D72829 Elevated white blood cell count, unspecified: Secondary | ICD-10-CM | POA: Diagnosis not present

## 2021-07-05 DIAGNOSIS — I10 Essential (primary) hypertension: Secondary | ICD-10-CM | POA: Insufficient documentation

## 2021-07-05 DIAGNOSIS — E119 Type 2 diabetes mellitus without complications: Secondary | ICD-10-CM | POA: Insufficient documentation

## 2021-07-05 DIAGNOSIS — Z87891 Personal history of nicotine dependence: Secondary | ICD-10-CM | POA: Insufficient documentation

## 2021-07-05 DIAGNOSIS — E785 Hyperlipidemia, unspecified: Secondary | ICD-10-CM | POA: Insufficient documentation

## 2021-07-05 DIAGNOSIS — J449 Chronic obstructive pulmonary disease, unspecified: Secondary | ICD-10-CM | POA: Diagnosis not present

## 2021-07-05 DIAGNOSIS — Z794 Long term (current) use of insulin: Secondary | ICD-10-CM | POA: Insufficient documentation

## 2021-07-05 DIAGNOSIS — Z7982 Long term (current) use of aspirin: Secondary | ICD-10-CM | POA: Diagnosis not present

## 2021-07-05 DIAGNOSIS — G4733 Obstructive sleep apnea (adult) (pediatric): Secondary | ICD-10-CM | POA: Insufficient documentation

## 2021-07-05 DIAGNOSIS — K219 Gastro-esophageal reflux disease without esophagitis: Secondary | ICD-10-CM | POA: Insufficient documentation

## 2021-07-05 DIAGNOSIS — I2510 Atherosclerotic heart disease of native coronary artery without angina pectoris: Secondary | ICD-10-CM | POA: Diagnosis not present

## 2021-07-05 DIAGNOSIS — Z79899 Other long term (current) drug therapy: Secondary | ICD-10-CM | POA: Insufficient documentation

## 2021-07-05 LAB — CBC WITH DIFFERENTIAL (CANCER CENTER ONLY)
Abs Immature Granulocytes: 0.05 10*3/uL (ref 0.00–0.07)
Basophils Absolute: 0.1 10*3/uL (ref 0.0–0.1)
Basophils Relative: 1 %
Eosinophils Absolute: 0.2 10*3/uL (ref 0.0–0.5)
Eosinophils Relative: 2 %
HCT: 39.9 % (ref 36.0–46.0)
Hemoglobin: 12.5 g/dL (ref 12.0–15.0)
Immature Granulocytes: 0 %
Lymphocytes Relative: 19 %
Lymphs Abs: 2.3 10*3/uL (ref 0.7–4.0)
MCH: 29.8 pg (ref 26.0–34.0)
MCHC: 31.3 g/dL (ref 30.0–36.0)
MCV: 95.2 fL (ref 80.0–100.0)
Monocytes Absolute: 0.9 10*3/uL (ref 0.1–1.0)
Monocytes Relative: 8 %
Neutro Abs: 8.4 10*3/uL — ABNORMAL HIGH (ref 1.7–7.7)
Neutrophils Relative %: 70 %
Platelet Count: 251 10*3/uL (ref 150–400)
RBC: 4.19 MIL/uL (ref 3.87–5.11)
RDW: 14.4 % (ref 11.5–15.5)
WBC Count: 12 10*3/uL — ABNORMAL HIGH (ref 4.0–10.5)
nRBC: 0 % (ref 0.0–0.2)

## 2021-07-05 LAB — CMP (CANCER CENTER ONLY)
ALT: 26 U/L (ref 0–44)
AST: 38 U/L (ref 15–41)
Albumin: 3.4 g/dL — ABNORMAL LOW (ref 3.5–5.0)
Alkaline Phosphatase: 90 U/L (ref 38–126)
Anion gap: 7 (ref 5–15)
BUN: 24 mg/dL — ABNORMAL HIGH (ref 8–23)
CO2: 27 mmol/L (ref 22–32)
Calcium: 9.7 mg/dL (ref 8.9–10.3)
Chloride: 108 mmol/L (ref 98–111)
Creatinine: 2.34 mg/dL — ABNORMAL HIGH (ref 0.44–1.00)
GFR, Estimated: 21 mL/min — ABNORMAL LOW (ref >60)
Glucose, Bld: 107 mg/dL — ABNORMAL HIGH (ref 70–99)
Potassium: 4.5 mmol/L (ref 3.5–5.1)
Sodium: 142 mmol/L (ref 135–145)
Total Bilirubin: 0.4 mg/dL (ref 0.3–1.2)
Total Protein: 7.6 g/dL (ref 6.5–8.1)

## 2021-07-05 LAB — LACTATE DEHYDROGENASE: LDH: 129 U/L (ref 98–192)

## 2021-07-05 NOTE — Telephone Encounter (Signed)
-----   Message from Star Age, MD sent at 06/29/2021  4:02 PM EDT ----- Please call patient and advise her that her EMG and nerve conduction velocity test on the right leg showed benign findings, no evidence of pinched nerve from back or neuropathy/nerve damage.

## 2021-07-05 NOTE — Telephone Encounter (Signed)
I called the pt and left a vm advising of results and advised of results.   Pt was advised to cb if she had questions/concerns.

## 2021-07-06 ENCOUNTER — Telehealth: Payer: Self-pay | Admitting: Hematology

## 2021-07-06 DIAGNOSIS — E1129 Type 2 diabetes mellitus with other diabetic kidney complication: Secondary | ICD-10-CM | POA: Diagnosis not present

## 2021-07-06 DIAGNOSIS — Z794 Long term (current) use of insulin: Secondary | ICD-10-CM | POA: Diagnosis not present

## 2021-07-06 DIAGNOSIS — I129 Hypertensive chronic kidney disease with stage 1 through stage 4 chronic kidney disease, or unspecified chronic kidney disease: Secondary | ICD-10-CM | POA: Diagnosis not present

## 2021-07-06 DIAGNOSIS — N184 Chronic kidney disease, stage 4 (severe): Secondary | ICD-10-CM | POA: Diagnosis not present

## 2021-07-06 DIAGNOSIS — E785 Hyperlipidemia, unspecified: Secondary | ICD-10-CM | POA: Diagnosis not present

## 2021-07-06 NOTE — Telephone Encounter (Signed)
Scheduled follow-up appointment per 7/6 los. Patient is aware. 

## 2021-07-07 ENCOUNTER — Encounter: Payer: Self-pay | Admitting: Physical Therapy

## 2021-07-07 ENCOUNTER — Other Ambulatory Visit: Payer: Self-pay

## 2021-07-07 ENCOUNTER — Ambulatory Visit: Payer: Medicare Other | Attending: Internal Medicine | Admitting: Physical Therapy

## 2021-07-07 DIAGNOSIS — G8929 Other chronic pain: Secondary | ICD-10-CM | POA: Diagnosis not present

## 2021-07-07 DIAGNOSIS — R293 Abnormal posture: Secondary | ICD-10-CM | POA: Diagnosis not present

## 2021-07-07 DIAGNOSIS — M5441 Lumbago with sciatica, right side: Secondary | ICD-10-CM | POA: Insufficient documentation

## 2021-07-07 NOTE — Therapy (Signed)
[facility] [place], [place], [address] Phone: [phone]   Fax:  [phone]  Physical Therapy Treatment  Patient Details  Name: [name] [name] MRN: [id number] Date of Birth: 16-Nov-1941 Referring Provider (PT): Marton Redwood MD   Encounter Date: 07/07/2021   PT End of Session - 07/07/21 1119     Visit Number 10    Number of Visits 12    Date for PT Re-Evaluation 08/31/21    Authorization Type FOTO AT LEAST EVERY 5TH VISIT.  PROGRESS NOTE AT 10TH VISIT.  KX MODIFIER AFTER 15 VISITS.    PT Start Time 1117    PT Stop Time 1210    PT Time Calculation (min) 53 min    Activity Tolerance Patient tolerated treatment well    Behavior During Therapy WFL for tasks assessed/performed             Past Medical History:  Diagnosis Date   Aortic valve stenosis, mild    per echo 02-13-2016  possible bicuspid w/ moderate thickened and calicified ,  valve area 1.34cm^2    pt denies   COPD (chronic obstructive pulmonary disease) ([place])    GERD (gastroesophageal reflux disease)    no problems since started on CPAP   Hematuria    History of adenomatous polyp of colon    tubular adnenoma's and hyperplastic polyp's 05-19-2002;  08-15-2010;  2015   History of kidney stones    multiple since 1970's   History of squamous cell carcinoma excision    05-14-2012  right arm;  04-18-2015  left ankle   HTN (hypertension)    Hyperlipidemia    Iron deficiency anemia    Left ureteral stone    OSA on CPAP    per study 10-13-2006 mild to moderate osa w/ hypersomnia   Seasonal and perennial allergic rhinitis    Type 2 diabetes mellitus ([place])    type 2   Urgency of urination     Past Surgical History:  Procedure Laterality Date   BIOPSY  03/29/2020   Procedure: BIOPSY;  Surgeon: Clarene Essex, MD;  Location: [facility];  Service: Endoscopy;;   CARPAL TUNNEL RELEASE Bilateral 2003   COLONOSCOPY  last one 2015   CT CHEST WITH CONTRAST    ([place] HX)     Advanced aortic and branch vessel atherosclerosis. Tortuous thoracic aorta. Normal heart size, without pericardial effusion. Multivessel coronary artery atherosclerosis   CYSTOSCOPY WITH RETROGRADE PYELOGRAM, URETEROSCOPY AND STENT PLACEMENT Left 04/15/2017   Procedure: CYSTOSCOPY WITH LEFT  RETROGRADE PYELOGRAM, URETEROSCOPYwith stone basketry;  Surgeon: Kathie Rhodes, MD;  Location: [facility];  Service: Urology;  Laterality: Left;   D & C HYSTEROSCOPY W/ RESECTION FIBROID AND POLYP  07/30/2000   DOBUTAMINE STRESS ECHO  01-30-2008   [facility]   no ischemia/  normal wall motion/  post stress ef 79%   ESOPHAGOGASTRODUODENOSCOPY (EGD) WITH PROPOFOL N/A 01/28/2018   Procedure: ESOPHAGOGASTRODUODENOSCOPY (EGD) WITH PROPOFOL;  Surgeon: Clarene Essex, MD;  [facility]: [facility];  Service: Endoscopy;  Laterality: N/A;   ESOPHAGOGASTRODUODENOSCOPY (EGD) WITH PROPOFOL N/A 08/19/2019   Procedure: ESOPHAGOGASTRODUODENOSCOPY (EGD) WITH PROPOFOL;  Surgeon: Clarene Essex, MD;  [facility]: [facility];  Service: Endoscopy;  Laterality: N/A;   ESOPHAGOGASTRODUODENOSCOPY (EGD) WITH PROPOFOL N/A 03/29/2020   Procedure: ESOPHAGOGASTRODUODENOSCOPY (EGD) WITH PROPOFOL;  Surgeon: Clarene Essex, MD;  Location: [facility];  Service: Endoscopy;  Laterality: N/A;   FACIAL FRACTURE SURGERY  1955   trauma    HEMOSTASIS CLIP PLACEMENT  08/19/2019   Procedure: HEMOSTASIS CLIP PLACEMENT;  Surgeon: Clarene Essex, MD;  [facility]: [facility];  Service: Endoscopy;;   HOT HEMOSTASIS N/A 01/28/2018   Procedure: HOT HEMOSTASIS (ARGON PLASMA COAGULATION/BICAP);  Surgeon: Clarene Essex, MD;  Location: [facility];  Service: Endoscopy;  Laterality: N/A;   LAPAROSCOPIC CHOLECYSTECTOMY  08/20/2001   w/ ERCP spincterotomy w/ balloon   NM MYOVIEW LTD  7/'19; 6/'21   LOW RISK.  EF > 65%. No Ischemia or Infarction. Normal Study: Hyperdynamic.   POLYPECTOMY  08/19/2019   Procedure: POLYPECTOMY;  Surgeon: Clarene Essex, MD;   [facility]: [facility];  Service: Endoscopy;;   TRANSTHORACIC ECHOCARDIOGRAM  02/13/2016   LVEF 55-60%. Gr 1 DD. ? possible bicuspid AoV w/ moderate thickened / calcified AoV w/ mild Stenosis (Mean Gradietn 15 mmHg, Valve area 1.34cm^2), no regurg.     TRANSTHORACIC ECHOCARDIOGRAM  06/2018   EF 55-65%. Gr 1 DD. Mild AS (? Functionally bicuspid AoV with moderate leaflet thickening) - Mean Gradient 15 mmHg, Peak 32 mmHg   TUBAL LIGATION Bilateral yrs ago    There were no vitals filed for this visit.   Subjective Assessment - 07/07/21 1116     Subjective COVID-19 screen performed prior to patient entering clinic. Patient has been experiencing shooting/pins pain into anterior R thigh. Has been doing nerve study, MRI, xrays for other health conditions.    Pertinent History COPD, GERD, bilateral CTS, DM,.    How long can you sit comfortably? Varies.    How long can you stand comfortably? Varies.    How long can you walk comfortably? Short community distance with cane.    Diagnostic tests MRI,    Patient Stated Goals Reduce pain and get around better and not have legs give way.    Currently in Pain? Yes    Pain Location Back   reports that could be related to kidney issue per patient report   Pain Orientation Right;Lower    Pain Descriptors / Indicators Discomfort    Pain Type Chronic pain    Pain Onset More than a month ago    Pain Frequency Constant                               [facility] Adult PT Treatment/Exercise - 07/07/21 0001       Knee/Hip Exercises: Aerobic   Nustep L3 x15 min      Traction   Type of Traction Lumbar    Min (lbs) 5    Max (lbs) 90    Hold Time 99    Rest Time 5    Time 15      Manual Therapy   Manual Therapy Soft tissue mobilization    Soft tissue mobilization STW of R lumbar paraspinals, SI joint, glute to reduce tone and soreness                         PT Long Term Goals - 06/19/21 1125       PT LONG TERM GOAL #1    Title Independent with an HEP.    Time 6    Period Weeks    Status On-going      PT LONG TERM GOAL #2   Title Eliminate right thigh numbness.    Baseline no change in symptoms 06/19/21    Time 6    Period Weeks      PT LONG TERM GOAL #3  Title Stand in erect posture.    Baseline foward flexed posture 06/19/21    Time 6    Period Weeks    Status On-going      PT LONG TERM GOAL #4   Title Perform ADL's with pain not > 3-4/10.    Baseline 5/10 pain with ADL's 06/19/21    Time 6    Period Weeks    Status On-going                   Plan - 07/07/21 1230     Clinical Impression Statement Patient presented in clinic with continued LBP but she was unsure if it was related to her "bad" R kidney. Patient has been having multiple scans and testing done in the last few days per patient report. Patient presented with less tone of R glute but increased tone of R lumbar paraspinals. Patient also reported a new shooting, pins feeling of anterior R thigh. Normal traction response to 90# max.    Personal Factors and Comorbidities Comorbidity 1;Comorbidity 2;Other    Comorbidities COPD, GERD, bilateral CTS, DM,.    Examination-Activity Limitations Other;Locomotion Level    Examination-Participation Restrictions Other    Stability/Clinical Decision Making Evolving/Moderate complexity    Rehab Potential Good    PT Frequency 2x / week    PT Duration 6 weeks    PT Treatment/Interventions ADLs/Self Care Home Management;Cryotherapy;Electrical Stimulation;Ultrasound;Traction;Moist Heat;Functional mobility training;Therapeutic activities;Therapeutic exercise;Manual techniques;Patient/family education;Passive range of motion;Dry needling    PT Next Visit Plan Intermittment lumbar traction to 90#.    Consulted and Agree with Plan of Care Patient             Patient will benefit from skilled therapeutic intervention in order to improve the following deficits and impairments:  Pain,  Abnormal gait, Decreased activity tolerance, Decreased mobility, Decreased range of motion, Increased muscle spasms, Postural dysfunction  Visit Diagnosis: Chronic right-sided low back pain with right-sided sciatica  Abnormal posture     Problem List Patient Active Problem List   Diagnosis Date Noted   Multiple lung nodules on CT 09/20/2019   COPD mixed type ([place]) 09/16/2018   Coronary artery calcification seen on computed tomography 06/30/2018   Precordial pain 06/30/2018   Moderate aortic stenosis by prior echocardiogram 06/30/2018   Anemia 01/17/2017   Depression 01/17/2017   Thyroid nodule 01/17/2017   Kidney stone 01/17/2017   Osteopenia 01/17/2017   Hyperlipidemia associated with type 2 diabetes mellitus ([place]) 01/17/2017   Leukocytosis 01/17/2017   Adenomatous colon polyp 01/17/2017   Dyspnea 07/20/2011   DIABETES, TYPE 2 07/19/2010   Essential hypertension 07/19/2010   Seasonal and perennial allergic rhinitis 07/19/2010   SIALADENITIS, RIGHT 07/19/2010   G E R D 07/19/2010   Obstructive sleep apnea 06/06/2010    Standley Brooking, PTA 07/07/2021, 12:34 PM  [facility] [address] [place], [place], [address] Phone: [phone]   Fax:  [phone]  Name: [name] [name] MRN: [id number] Date of Birth: 1941-05-21  Progress Note Reporting Period 06/01/21 to 07/07/21  See note below for Objective Data and Assessment of Progress/Goals. Patient's goals currently ongoing.    Mali Applegate MPT

## 2021-07-12 ENCOUNTER — Encounter: Payer: Medicare Other | Admitting: Physical Therapy

## 2021-07-14 ENCOUNTER — Other Ambulatory Visit: Payer: Self-pay

## 2021-07-14 ENCOUNTER — Ambulatory Visit: Payer: Medicare Other | Admitting: Physical Therapy

## 2021-07-14 DIAGNOSIS — G8929 Other chronic pain: Secondary | ICD-10-CM

## 2021-07-14 DIAGNOSIS — R293 Abnormal posture: Secondary | ICD-10-CM | POA: Diagnosis not present

## 2021-07-14 DIAGNOSIS — M5441 Lumbago with sciatica, right side: Secondary | ICD-10-CM | POA: Diagnosis not present

## 2021-07-14 LAB — BCR ABL1 FISH (GENPATH)

## 2021-07-14 LAB — JAK2 (INCLUDING V617F AND EXON 12), MPL,& CALR-NEXT GEN SEQ

## 2021-07-14 NOTE — Therapy (Signed)
[facility] [place], [place], [address] Phone: [phone]   Fax:  [phone]  Physical Therapy Treatment  Patient Details  Name: [name] [name] MRN: [id number] Date of Birth: June 19, 1941 Referring Provider (PT): Marton Redwood MD   Encounter Date: 07/14/2021   PT End of Session - 07/14/21 1302     Visit Number 11    Number of Visits 12    Date for PT Re-Evaluation 08/31/21    Authorization Type FOTO AT LEAST EVERY 5TH VISIT.  PROGRESS NOTE AT 10TH VISIT.  KX MODIFIER AFTER 15 VISITS.    PT Start Time 1115    PT Stop Time 1210    PT Time Calculation (min) 55 min    Activity Tolerance Patient tolerated treatment well    Behavior During Therapy WFL for tasks assessed/performed             Past Medical History:  Diagnosis Date   Aortic valve stenosis, mild    per echo 02-13-2016  possible bicuspid w/ moderate thickened and calicified ,  valve area 1.34cm^2    pt denies   COPD (chronic obstructive pulmonary disease) ([place])    GERD (gastroesophageal reflux disease)    no problems since started on CPAP   Hematuria    History of adenomatous polyp of colon    tubular adnenoma's and hyperplastic polyp's 05-19-2002;  08-15-2010;  2015   History of kidney stones    multiple since 1970's   History of squamous cell carcinoma excision    05-14-2012  right arm;  04-18-2015  left ankle   HTN (hypertension)    Hyperlipidemia    Iron deficiency anemia    Left ureteral stone    OSA on CPAP    per study 10-13-2006 mild to moderate osa w/ hypersomnia   Seasonal and perennial allergic rhinitis    Type 2 diabetes mellitus ([place])    type 2   Urgency of urination     Past Surgical History:  Procedure Laterality Date   BIOPSY  03/29/2020   Procedure: BIOPSY;  Surgeon: Clarene Essex, MD;  [facility]: [facility];  Service: Endoscopy;;   CARPAL TUNNEL RELEASE Bilateral 2003   COLONOSCOPY  last one 2015   CT CHEST WITH CONTRAST    ([place] HX)     Advanced aortic and branch vessel atherosclerosis. Tortuous thoracic aorta. Normal heart size, without pericardial effusion. Multivessel coronary artery atherosclerosis   CYSTOSCOPY WITH RETROGRADE PYELOGRAM, URETEROSCOPY AND STENT PLACEMENT Left 04/15/2017   Procedure: CYSTOSCOPY WITH LEFT  RETROGRADE PYELOGRAM, URETEROSCOPYwith stone basketry;  Surgeon: Kathie Rhodes, MD;  Location: [facility];  Service: Urology;  Laterality: Left;   D & C HYSTEROSCOPY W/ RESECTION FIBROID AND POLYP  07/30/2000   DOBUTAMINE STRESS ECHO  01-30-2008   [facility]   no ischemia/  normal wall motion/  post stress ef 79%   ESOPHAGOGASTRODUODENOSCOPY (EGD) WITH PROPOFOL N/A 01/28/2018   Procedure: ESOPHAGOGASTRODUODENOSCOPY (EGD) WITH PROPOFOL;  Surgeon: Clarene Essex, MD;  [facility];  Service: Endoscopy;  Laterality: N/A;   ESOPHAGOGASTRODUODENOSCOPY (EGD) WITH PROPOFOL N/A 08/19/2019   Procedure: ESOPHAGOGASTRODUODENOSCOPY (EGD) WITH PROPOFOL;  Surgeon: Clarene Essex, MD;  [facility]: [facility];  Service: Endoscopy;  Laterality: N/A;   ESOPHAGOGASTRODUODENOSCOPY (EGD) WITH PROPOFOL N/A 03/29/2020   Procedure: ESOPHAGOGASTRODUODENOSCOPY (EGD) WITH PROPOFOL;  Surgeon: Clarene Essex, MD;  [facility]: [facility];  Service: Endoscopy;  Laterality: N/A;   FACIAL FRACTURE SURGERY  1955   trauma    HEMOSTASIS CLIP PLACEMENT  08/19/2019   Procedure: HEMOSTASIS CLIP PLACEMENT;  Surgeon: Clarene Essex, MD;  [facility]: [facility];  Service: Endoscopy;;   HOT HEMOSTASIS N/A 01/28/2018   Procedure: HOT HEMOSTASIS (ARGON PLASMA COAGULATION/BICAP);  Surgeon: Clarene Essex, MD;  Location: [facility];  Service: Endoscopy;  Laterality: N/A;   LAPAROSCOPIC CHOLECYSTECTOMY  08/20/2001   w/ ERCP spincterotomy w/ balloon   NM MYOVIEW LTD  7/'19; 6/'21   LOW RISK.  EF > 65%. No Ischemia or Infarction. Normal Study: Hyperdynamic.   POLYPECTOMY  08/19/2019   Procedure: POLYPECTOMY;  Surgeon: Clarene Essex, MD;   [facility]: [facility];  Service: Endoscopy;;   TRANSTHORACIC ECHOCARDIOGRAM  02/13/2016   LVEF 55-60%. Gr 1 DD. ? possible bicuspid AoV w/ moderate thickened / calcified AoV w/ mild Stenosis (Mean Gradietn 15 mmHg, Valve area 1.34cm^2), no regurg.     TRANSTHORACIC ECHOCARDIOGRAM  06/2018   EF 55-65%. Gr 1 DD. Mild AS (? Functionally bicuspid AoV with moderate leaflet thickening) - Mean Gradient 15 mmHg, Peak 32 mmHg   TUBAL LIGATION Bilateral yrs ago    There were no vitals filed for this visit.   Subjective Assessment - 07/14/21 1302     Subjective COVID-19 screen performed prior to patient entering clinic.  Much better.  Almost no pain.    Pertinent History COPD, GERD, bilateral CTS, DM,.    How long can you sit comfortably? Varies.    How long can you stand comfortably? Varies.    How long can you walk comfortably? Short community distance with cane.    Diagnostic tests MRI,    Currently in Pain? Yes    Pain Score 1     Pain Location Back    Pain Orientation Right;Lower    Pain Descriptors / Indicators Discomfort    Pain Type Chronic pain    Pain Onset More than a month ago                               [facility] Adult PT Treatment/Exercise - 07/14/21 0001       Exercises   Exercises Knee/Hip      Lumbar Exercises: Aerobic   Nustep Level 4 x 23 minutes  monitoted Nustep with focus on increased cadence today over extended time for endurance.      Modalities   Modalities Traction      Traction   Type of Traction Lumbar    Min (lbs) 5    Max (lbs) 90    Hold Time 99    Rest Time 5    Time 15                         PT Long Term Goals - 06/19/21 1125       PT LONG TERM GOAL #1   Title Independent with an HEP.    Time 6    Period Weeks    Status On-going      PT LONG TERM GOAL #2   Title Eliminate right thigh numbness.    Baseline no change in symptoms 06/19/21    Time 6    Period Weeks      PT LONG TERM GOAL #3   Title  Stand in erect posture.    Baseline foward flexed posture 06/19/21    Time 6    Period Weeks    Status On-going      PT LONG TERM GOAL #4  Title Perform ADL's with pain not > 3-4/10.    Baseline 5/10 pain with ADL's 06/19/21    Time 6    Period Weeks    Status On-going                   Plan - 07/14/21 1305     Clinical Impression Statement Patient did great today with no pain reported after treatment.  She was able to increase her time, cadence and resistance today without complaint.  She is very pleased with her progress.    Personal Factors and Comorbidities Comorbidity 1;Comorbidity 2;Other    Comorbidities COPD, GERD, bilateral CTS, DM,.    Examination-Activity Limitations Other;Locomotion Level    Examination-Participation Restrictions Other    Stability/Clinical Decision Making Evolving/Moderate complexity    Rehab Potential Good    PT Frequency 2x / week    PT Duration 6 weeks    PT Treatment/Interventions ADLs/Self Care Home Management;Cryotherapy;Electrical Stimulation;Ultrasound;Traction;Moist Heat;Functional mobility training;Therapeutic activities;Therapeutic exercise;Manual techniques;Patient/family education;Passive range of motion;Dry needling    PT Next Visit Plan FOTO and discharge next visit.    Consulted and Agree with Plan of Care Patient             Patient will benefit from skilled therapeutic intervention in order to improve the following deficits and impairments:  Pain, Abnormal gait, Decreased activity tolerance, Decreased mobility, Decreased range of motion, Increased muscle spasms, Postural dysfunction  Visit Diagnosis: Chronic right-sided low back pain with right-sided sciatica  Abnormal posture     Problem List Patient Active Problem List   Diagnosis Date Noted   Multiple lung nodules on CT 09/20/2019   COPD mixed type ([place]) 09/16/2018   Coronary artery calcification seen on computed tomography 06/30/2018   Precordial pain  06/30/2018   Moderate aortic stenosis by prior echocardiogram 06/30/2018   Anemia 01/17/2017   Depression 01/17/2017   Thyroid nodule 01/17/2017   Kidney stone 01/17/2017   Osteopenia 01/17/2017   Hyperlipidemia associated with type 2 diabetes mellitus ([place]) 01/17/2017   Leukocytosis 01/17/2017   Adenomatous colon polyp 01/17/2017   Dyspnea 07/20/2011   DIABETES, TYPE 2 07/19/2010   Essential hypertension 07/19/2010   Seasonal and perennial allergic rhinitis 07/19/2010   SIALADENITIS, RIGHT 07/19/2010   G E R D 07/19/2010   Obstructive sleep apnea 06/06/2010    Nakyra Bourn, [place] MPT 07/14/2021, 1:09 PM  [facility] [address] [place], [place], [address] Phone: [phone]   Fax:  [phone]  Name: [name] [name] MRN: [id number] Date of Birth: 1941/03/30

## 2021-07-18 ENCOUNTER — Ambulatory Visit: Payer: Medicare Other | Admitting: *Deleted

## 2021-07-19 ENCOUNTER — Inpatient Hospital Stay: Payer: Medicare Other | Admitting: Hematology

## 2021-07-20 ENCOUNTER — Other Ambulatory Visit: Payer: Self-pay

## 2021-07-20 ENCOUNTER — Ambulatory Visit: Payer: Medicare Other | Admitting: Physical Therapy

## 2021-07-20 DIAGNOSIS — R293 Abnormal posture: Secondary | ICD-10-CM | POA: Diagnosis not present

## 2021-07-20 DIAGNOSIS — M5441 Lumbago with sciatica, right side: Secondary | ICD-10-CM

## 2021-07-20 DIAGNOSIS — G8929 Other chronic pain: Secondary | ICD-10-CM

## 2021-07-20 NOTE — Patient Instructions (Signed)
  Lower Body: Toe Rise   Standing, place feet apart. Hold arms out for balance or use support. Rise up on toes. Hold _3___ seconds, then lower. Repeat immediately. Repeat __10__ times. Do __2__ sessions per day.   Half Squat to Chair   Stand with feet shoulder width apart. Push buttocks backward and lower slowly, sitting in chair lightly and returning to standing position. Complete _2_ sets of 10_ repetitions. Perform __2-3_ sessions per day.     Hip abduction   While sitting with good posture, tie theraband around knees and pull apart. Slowly resume starting position. x30 1-2 x day    Standing lat pull with theraband  Anchor bands higher than your head. Start with your arms straight out in front of you at shoulder height (or a little above).  Pull bands down next to your body and then slowly return to the starting position.  10-30 x1day

## 2021-07-20 NOTE — Therapy (Signed)
[facility] [address] [place], , [address] Phone: [phone]   Fax:  [phone]  Physical Therapy Treatment  Patient Details  Name: [name] [name] MRN: [id number] Date of Birth: 01/24/1941 Referring Provider (PT): William Shaw MD   Encounter Date: 07/20/2021   PT End of Session - 07/20/21 1120     Visit Number 12    Number of Visits 12    Date for PT Re-Evaluation 08/31/21    Authorization Type FOTO 12th visit 58 score AT LEAST EVERY 5TH VISIT.  PROGRESS NOTE AT 10TH VISIT.  KX MODIFIER AFTER 15 VISITS.    PT Start Time 1116    PT Stop Time 1201    PT Time Calculation (min) 45 min    Activity Tolerance Patient tolerated treatment well    Behavior During Therapy WFL for tasks assessed/performed             Past Medical History:  Diagnosis Date   Aortic valve stenosis, mild    per echo 02-13-2016  possible bicuspid w/ moderate thickened and calicified ,  valve area 1.34cm^2    pt denies   COPD (chronic obstructive pulmonary disease) (HCC)    GERD (gastroesophageal reflux disease)    no problems since started on CPAP   Hematuria    History of adenomatous polyp of colon    tubular adnenoma's and hyperplastic polyp's 05-19-2002;  08-15-2010;  2015   History of kidney stones    multiple since 1970's   History of squamous cell carcinoma excision    05-14-2012  right arm;  04-18-2015  left ankle   HTN (hypertension)    Hyperlipidemia    Iron deficiency anemia    Left ureteral stone    OSA on CPAP    per study 10-13-2006 mild to moderate osa w/ hypersomnia   Seasonal and perennial allergic rhinitis    Type 2 diabetes mellitus (HCC)    type 2   Urgency of urination     Past Surgical History:  Procedure Laterality Date   BIOPSY  03/29/2020   Procedure: BIOPSY;  Surgeon: Magod, Marc, MD;  Location: [facility];  Service: Endoscopy;;   CARPAL TUNNEL RELEASE Bilateral 2003   COLONOSCOPY  last one 2015   CT  CHEST WITH CONTRAST   ([facility] HX)     Advanced aortic and branch vessel atherosclerosis. Tortuous thoracic aorta. Normal heart size, without pericardial effusion. Multivessel coronary artery atherosclerosis   CYSTOSCOPY WITH RETROGRADE PYELOGRAM, URETEROSCOPY AND STENT PLACEMENT Left 04/15/2017   Procedure: CYSTOSCOPY WITH LEFT  RETROGRADE PYELOGRAM, URETEROSCOPYwith stone basketry;  Surgeon: Mark Ottelin, MD;  Location:  SURGERY CENTER;  Service: Urology;  Laterality: Left;   D & C HYSTEROSCOPY W/ RESECTION FIBROID AND POLYP  07/30/2000   DOBUTAMINE STRESS ECHO  01-30-2008   [facility]   no ischemia/  normal wall motion/  post stress ef 79%   ESOPHAGOGASTRODUODENOSCOPY (EGD) WITH PROPOFOL N/A 01/28/2018   Procedure: ESOPHAGOGASTRODUODENOSCOPY (EGD) WITH PROPOFOL;  Surgeon: Magod, Marc, MD;  Location: [facility];  Service: Endoscopy;  Laterality: N/A;   ESOPHAGOGASTRODUODENOSCOPY (EGD) WITH PROPOFOL N/A 08/19/2019   Procedure: ESOPHAGOGASTRODUODENOSCOPY (EGD) WITH PROPOFOL;  Surgeon: Magod, Marc, MD;  Location: [facility];  Service: Endoscopy;  Laterality: N/A;   ESOPHAGOGASTRODUODENOSCOPY (EGD) WITH PROPOFOL N/A 03/29/2020   Procedure: ESOPHAGOGASTRODUODENOSCOPY (EGD) WITH PROPOFOL;  Surgeon: Magod, Marc, MD;  Location: [facility];  Service: Endoscopy;  Laterality: N/A;   FACIAL FRACTURE SURGERY  1955   trauma      HEMOSTASIS CLIP PLACEMENT  08/19/2019   Procedure: HEMOSTASIS CLIP PLACEMENT;  Surgeon: Clarene Essex, MD;  Location: [facility];  Service: Endoscopy;;   HOT HEMOSTASIS N/A 01/28/2018   Procedure: HOT HEMOSTASIS (ARGON PLASMA COAGULATION/BICAP);  Surgeon: Clarene Essex, MD;  Location: [facility];  Service: Endoscopy;  Laterality: N/A;   LAPAROSCOPIC CHOLECYSTECTOMY  08/20/2001   w/ ERCP spincterotomy w/ balloon   NM MYOVIEW LTD  7/'19; 6/'21   LOW RISK.  EF > 65%. No Ischemia or Infarction. Normal Study: Hyperdynamic.   POLYPECTOMY  08/19/2019   Procedure: POLYPECTOMY;  Surgeon:  Clarene Essex, MD;  Location: [facility];  Service: Endoscopy;;   TRANSTHORACIC ECHOCARDIOGRAM  02/13/2016   LVEF 55-60%. Gr 1 DD. ? possible bicuspid AoV w/ moderate thickened / calcified AoV w/ mild Stenosis (Mean Gradietn 15 mmHg, Valve area 1.34cm^2), no regurg.     TRANSTHORACIC ECHOCARDIOGRAM  06/2018   EF 55-65%. Gr 1 DD. Mild AS (? Functionally bicuspid AoV with moderate leaflet thickening) - Mean Gradient 15 mmHg, Peak 32 mmHg   TUBAL LIGATION Bilateral yrs ago    There were no vitals filed for this visit.   Subjective Assessment - 07/20/21 1115     Subjective COVID-19 screen performed prior to patient entering clinic.  Patient arrived with less pain yet some ongoing symptoms in LE    Pertinent History COPD, GERD, bilateral CTS, DM,.    How long can you sit comfortably? Varies.    How long can you stand comfortably? Varies.    How long can you walk comfortably? Short community distance with cane.    Diagnostic tests MRI,    Patient Stated Goals Reduce pain and get around better and not have legs give way.    Currently in Pain? Yes    Pain Score 1     Pain Location Back    Pain Orientation Right;Lower    Pain Radiating Towards numbness in right LE to knee    Pain Onset More than a month ago    Pain Frequency Constant    Aggravating Factors  transition movements    Pain Relieving Factors upright standing posture                               [facility] Adult PT Treatment/Exercise - 07/20/21 0001       Lumbar Exercises: Aerobic   Nustep L4 x83mn UE/LE      Lumbar Exercises: Standing   Shoulder Extension Strengthening;Both;20 reps;Theraband    Theraband Level (Shoulder Extension) Level 2 (Red)    Shoulder Extension Limitations with abdominal bracing      Knee/Hip Exercises: Standing   Heel Raises Both;20 reps      Knee/Hip Exercises: Seated   Clamshell with TheraBand Red   x30   Sit to Sand with UE support   2x10     Traction   Type of Traction  Lumbar    Min (lbs) 5    Max (lbs) 90    Hold Time 99    Rest Time 5    Time 15                    PT Education - 07/20/21 1154     Education Details HEP progression    Person(s) Educated Patient    Methods Explanation;Demonstration;Handout    Comprehension Verbalized understanding;Returned demonstration  PT Long Term Goals - 07/20/21 1123       PT LONG TERM GOAL #1   Title Independent with an HEP.    Baseline Met 07/20/21    Time 6    Period Weeks    Status Achieved      PT LONG TERM GOAL #2   Title Eliminate right thigh numbness.    Baseline no change in symptoms 07/20/21    Time 6    Period Weeks    Status Not Met      PT LONG TERM GOAL #3   Title Stand in erect posture.    Baseline improved yet only limited upright posture 07/20/21    Time 6    Period Weeks    Status Not Met      PT LONG TERM GOAL #4   Title Perform ADL's with pain not > 3-4/10.    Baseline pain is minimal with numbness 07/20/21    Time 6    Period Weeks    Status Achieved                   Plan - 07/20/21 1155     Clinical Impression Statement Patient tolerated treatment well today. HEP given for home progression. Patient has little pain yet no relief from symptoms of numbness in LE. Patient is going to DC to HEP and joining a gym program.    Personal Factors and Comorbidities Comorbidity 1;Comorbidity 2;Other    Comorbidities COPD, GERD, bilateral CTS, DM,.    Examination-Activity Limitations Other;Locomotion Level    Examination-Participation Restrictions Other    Stability/Clinical Decision Making Evolving/Moderate complexity    Rehab Potential Good    PT Frequency 2x / week    PT Duration 6 weeks    PT Treatment/Interventions ADLs/Self Care Home Management;Cryotherapy;Electrical Stimulation;Ultrasound;Traction;Moist Heat;Functional mobility training;Therapeutic activities;Therapeutic exercise;Manual techniques;Patient/family education;Passive  range of motion;Dry needling    PT Next Visit Plan DC per PT    Consulted and Agree with Plan of Care Patient             Patient will benefit from skilled therapeutic intervention in order to improve the following deficits and impairments:  Pain, Abnormal gait, Decreased activity tolerance, Decreased mobility, Decreased range of motion, Increased muscle spasms, Postural dysfunction  Visit Diagnosis: Chronic right-sided low back pain with right-sided sciatica  Abnormal posture     Problem List Patient Active Problem List   Diagnosis Date Noted   Multiple lung nodules on CT 09/20/2019   COPD mixed type (HCC) 09/16/2018   Coronary artery calcification seen on computed tomography 06/30/2018   Precordial pain 06/30/2018   Moderate aortic stenosis by prior echocardiogram 06/30/2018   Anemia 01/17/2017   Depression 01/17/2017   Thyroid nodule 01/17/2017   Kidney stone 01/17/2017   Osteopenia 01/17/2017   Hyperlipidemia associated with type 2 diabetes mellitus (HCC) 01/17/2017   Leukocytosis 01/17/2017   Adenomatous colon polyp 01/17/2017   Dyspnea 07/20/2011   DIABETES, TYPE 2 07/19/2010   Essential hypertension 07/19/2010   Seasonal and perennial allergic rhinitis 07/19/2010   SIALADENITIS, RIGHT 07/19/2010   G E R D 07/19/2010   Obstructive sleep apnea 06/06/2010    , PTA 07/20/21 12:07 PM   [facility] [address] [place], [place], [address] Phone: [phone]   Fax:  [phone]  Name: Tashay T Worst MRN: [id number] Date of Birth: 05/07/1941   PHYSICAL THERAPY DISCHARGE SUMMARY  Visits from Start of Care: 12.  Current functional level   related to goals / functional outcomes: See above.   Remaining deficits: Patient very pleased with her overall progress.     Education / Equipment: HEP.   Patient agrees to discharge. Patient goals were partially met. Patient is being discharged due to  being pleased with the current functional level.    Chad Applegate MPT   

## 2021-07-24 ENCOUNTER — Other Ambulatory Visit: Payer: Self-pay | Admitting: Cardiology

## 2021-07-25 ENCOUNTER — Other Ambulatory Visit: Payer: Self-pay | Admitting: Cardiology

## 2021-07-30 NOTE — Assessment & Plan Note (Signed)
This machine was reset for her, but SD card still has previous owner's name. She usess it and benefits with no concerns. Plan- continue CPAP 8/ [employer]

## 2021-07-30 NOTE — Assessment & Plan Note (Signed)
Stable with no exacerbation. Plan- continue present meds. Encourage exercise- regular walking

## 2021-08-10 DIAGNOSIS — E785 Hyperlipidemia, unspecified: Secondary | ICD-10-CM | POA: Diagnosis not present

## 2021-08-17 DIAGNOSIS — N184 Chronic kidney disease, stage 4 (severe): Secondary | ICD-10-CM | POA: Diagnosis not present

## 2021-08-17 DIAGNOSIS — F3341 Major depressive disorder, recurrent, in partial remission: Secondary | ICD-10-CM | POA: Diagnosis not present

## 2021-08-17 DIAGNOSIS — E1149 Type 2 diabetes mellitus with other diabetic neurological complication: Secondary | ICD-10-CM | POA: Diagnosis not present

## 2021-08-17 DIAGNOSIS — J449 Chronic obstructive pulmonary disease, unspecified: Secondary | ICD-10-CM | POA: Diagnosis not present

## 2021-08-17 DIAGNOSIS — Z794 Long term (current) use of insulin: Secondary | ICD-10-CM | POA: Diagnosis not present

## 2021-08-17 DIAGNOSIS — E1129 Type 2 diabetes mellitus with other diabetic kidney complication: Secondary | ICD-10-CM | POA: Diagnosis not present

## 2021-08-17 DIAGNOSIS — I10 Essential (primary) hypertension: Secondary | ICD-10-CM | POA: Diagnosis not present

## 2021-08-17 DIAGNOSIS — G4733 Obstructive sleep apnea (adult) (pediatric): Secondary | ICD-10-CM | POA: Diagnosis not present

## 2021-08-17 DIAGNOSIS — M8580 Other specified disorders of bone density and structure, unspecified site: Secondary | ICD-10-CM | POA: Diagnosis not present

## 2021-08-17 DIAGNOSIS — E669 Obesity, unspecified: Secondary | ICD-10-CM | POA: Diagnosis not present

## 2021-08-17 DIAGNOSIS — E785 Hyperlipidemia, unspecified: Secondary | ICD-10-CM | POA: Diagnosis not present

## 2021-08-17 DIAGNOSIS — I129 Hypertensive chronic kidney disease with stage 1 through stage 4 chronic kidney disease, or unspecified chronic kidney disease: Secondary | ICD-10-CM | POA: Diagnosis not present

## 2021-08-21 ENCOUNTER — Other Ambulatory Visit: Payer: Self-pay | Admitting: Internal Medicine

## 2021-08-21 DIAGNOSIS — F17210 Nicotine dependence, cigarettes, uncomplicated: Secondary | ICD-10-CM

## 2021-09-05 DIAGNOSIS — Z87442 Personal history of urinary calculi: Secondary | ICD-10-CM | POA: Diagnosis not present

## 2021-09-05 DIAGNOSIS — D509 Iron deficiency anemia, unspecified: Secondary | ICD-10-CM | POA: Diagnosis not present

## 2021-09-05 DIAGNOSIS — N184 Chronic kidney disease, stage 4 (severe): Secondary | ICD-10-CM | POA: Diagnosis not present

## 2021-09-05 DIAGNOSIS — R809 Proteinuria, unspecified: Secondary | ICD-10-CM | POA: Diagnosis not present

## 2021-09-05 DIAGNOSIS — Z23 Encounter for immunization: Secondary | ICD-10-CM | POA: Diagnosis not present

## 2021-09-05 DIAGNOSIS — E1122 Type 2 diabetes mellitus with diabetic chronic kidney disease: Secondary | ICD-10-CM | POA: Diagnosis not present

## 2021-09-05 DIAGNOSIS — I129 Hypertensive chronic kidney disease with stage 1 through stage 4 chronic kidney disease, or unspecified chronic kidney disease: Secondary | ICD-10-CM | POA: Diagnosis not present

## 2021-09-06 ENCOUNTER — Ambulatory Visit (INDEPENDENT_AMBULATORY_CARE_PROVIDER_SITE_OTHER): Payer: Medicare Other | Admitting: Cardiology

## 2021-09-06 ENCOUNTER — Encounter: Payer: Self-pay | Admitting: Cardiology

## 2021-09-06 ENCOUNTER — Other Ambulatory Visit: Payer: Self-pay

## 2021-09-06 DIAGNOSIS — E785 Hyperlipidemia, unspecified: Secondary | ICD-10-CM | POA: Diagnosis not present

## 2021-09-06 DIAGNOSIS — I2510 Atherosclerotic heart disease of native coronary artery without angina pectoris: Secondary | ICD-10-CM | POA: Diagnosis not present

## 2021-09-06 DIAGNOSIS — I350 Nonrheumatic aortic (valve) stenosis: Secondary | ICD-10-CM | POA: Diagnosis not present

## 2021-09-06 DIAGNOSIS — I10 Essential (primary) hypertension: Secondary | ICD-10-CM

## 2021-09-06 DIAGNOSIS — E1169 Type 2 diabetes mellitus with other specified complication: Secondary | ICD-10-CM | POA: Diagnosis not present

## 2021-09-06 NOTE — Patient Instructions (Signed)

## 2021-09-06 NOTE — Progress Notes (Signed)
Primary Care Provider: Ginger Organ., MD Cardiologist: Glenetta Hew, MD Electrophysiologist: None  Clinic Note: Chief Complaint  Patient presents with   Follow-up    5-6 months.  Echo results.   Aortic Stenosis    Echo results.  No real exertional dyspnea.   ===================================  ASSESSMENT/PLAN   Problem List Items Addressed This Visit       Cardiology Problems   Essential hypertension (Chronic)    Blood pressure looks good on current meds -> we have previously converted from amlodipine to diltiazem to allow for some rate control.  In doing so we also increase losartan.  BP now remained stable.  No change      Moderate aortic stenosis by prior echocardiogram (Chronic)    Interestingly, the gradient "improved "compared to last time now down to 21 mmHg.  She does have hyperdynamic function with moderate LVH and therefore as there is some probably mild intracavitary gradient.  Likely associated diastolic function with atrial dilation, but not really having any significant heart failure symptoms.  Plan: With stable echocardiogram, plan to go back to every other year echoes.-> Recheck echo in summer 2024. Continue losartan and diltiazem for rate control and afterload reduction.      Coronary artery calcification seen on computed tomography (Chronic)    Back to back negative stress test in 2019 and 2021. No active symptoms now to suggest progression of disease.  Lipids look pretty well controlled as his blood pressure.  Plan: Continue diltiazem and losartan along with 10 mg rosuvastatin and Vascepa 2 g twice daily. Continue to encourage her dietary adjustments and increase exercise level.  Hopefully as her back improves still improved. She is on Trulicity which hopefully will continue to help her weight.. No longer on Farxiga-question concern of renal insufficiency and UTI issues. On maintenance dose aspirin.        Other   Hyperlipidemia associated  with type 2 diabetes mellitus (HCC) (Chronic)    Target LDL is less than 100, but goal will be less than 70.  Most recent lipids showed LDL of 73 on current doses of rosuvastatin and Vascepa.  Triglycerides also doing better.  With current levels, would not be any more aggressive.  Remains on Trulicity along with Levemir insulin for diabetes.  Last A1c recorded was in August-6.7.  Defer management to PCP.       ===================================  HPI:    [name] [name]") is a 80 y.o. female with a PMH Notable for Moderate Aortic Stenosis (functionally bicuspid), OSA-CPAP, COPD, Coronary Calcification (on CT, nonischemic Myoview) who presents today for 2-month follow-up to discuss follow-up echocardiogram.  [name] [name] was last seen on February 28, 2021 for routine follow-up.  She had made some dietary changes and had lost weight, unfortunately with her back pain she has not yet been to be as active as she would like.  Remains scared of walking because of fear of falling.  Also was feeling homebound because of fear of COVID.  Not going out.  Noted exertional dyspnea and mild end of day swelling.  Only noted chest pain with coughing, not with exertion. => Recommended converting diltiazem to p.m. dosing. -> Echo ordered prior to this visit  Recent Hospitalizations: None  Reviewed  CV studies:    The following studies were reviewed today: (if available, images/films reviewed: From Epic Chart or Care Everywhere) TTE 06/26/2021: EF> 75%.  Hyperdynamic function.  Moderate LVH.  Indeterminate filling pressures.  Normal RV.  Moderate aortic valve calcification.  Mean gradient 21 mmHg.  (Mild to Moderate AS)-previous mean gradient was 27 mmHg.   Interval History:   [name] [name] returns for follow-up today actually doing pretty well.  Her PCP referred her for rehab for chronic back pain..  After doing  Rehab for little while,  she is now doing Silver sneakers3 days a week for 1  hour.  Unfortunately, she has taken a break from the rehab and exercise.She says that her back is been hurting her a lot.  She has felt a lot better after using heat & stretching.  She has been making a concerted effort to adjust her diet recently.  Eating less and more healthy. Unfortunate, she Had backtracked a little bit when she first started having the back discomfort.  She says that she started "pity eating ".  She is now more active, Was actually out helping her family but up a fence.  She did have to stop Every now and then because of getting short of breath and back pain, but was surprised at how well she did. 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  CV Review of Symptoms (Summary) Cardiovascular ROS: positive for - Mild exercise intolerance because of Back pain and unsteady gait. Some chest wall pain, but no Exertional chest pain. negative for - chest pain, edema, irregular heartbeat, orthopnea, palpitations, paroxysmal nocturnal dyspnea, rapid heart rate, shortness of breath, or Lightheadedness, dizziness or wooziness, syncope/near syncope, TIA/amaurosis fugax, claudication  REVIEWED OF SYSTEMS   Review of Systems  Constitutional:  Positive for malaise/fatigue (Had stopped exercising some because of back injury, now getting back into it.  Energy level improving.) and weight loss (Has been doing well, but backtracked.  Now eating better and exercising again.).  HENT:  Negative for congestion and nosebleeds.   Respiratory:  Positive for shortness of breath (When she overdoes it). Negative for cough.   Cardiovascular:  Positive for leg swelling (Trivial end of day). Negative for claudication.  Gastrointestinal:  Negative for abdominal pain, blood in stool, melena and vomiting.  Genitourinary:  Negative for hematuria.  Musculoskeletal:  Positive for back pain and joint pain (Left hip).  Neurological:  Positive for weakness (Legs are little bit weak from the back  pain.). Negative for dizziness (Only if she stands up very quickly) and focal weakness.       Radicular pain down the right leg.  Psychiatric/Behavioral:  Negative for depression and memory loss. The patient is nervous/anxious. The patient does not have insomnia.   -> Now that the COVID restrictions have lightened up, she is looking forward to getting back out and doing things.Has started Silver sneakers at the [facility].  I have reviewed and (if needed) personally updated the patient's problem list, medications, allergies, past medical and surgical history, social and family history.   PAST MEDICAL HISTORY   Past Medical History:  Diagnosis Date   Aortic valve stenosis, mild 02/2016   Echo February 2017: possible bicuspid w/ moderate thickened and calicified, AVA ~2.99 cm -no symptoms; June 2022: Hyperdynamic EF 75%.  Mild to moderate AS-mean gradient 21 mmHg   COPD (chronic obstructive pulmonary disease) (HCC)    GERD (gastroesophageal reflux disease)    no problems since started on CPAP   Hematuria    History of adenomatous polyp of colon    tubular adnenoma's and hyperplastic polyp's 05-19-2002;  08-15-2010;  2015   History of kidney stones    multiple  since 1970's   History of squamous cell carcinoma excision    05-14-2012  right arm;  04-18-2015  left ankle   HTN (hypertension)    Hyperlipidemia    Iron deficiency anemia    Left ureteral stone    OSA on CPAP    per study 10-13-2006 mild to moderate osa w/ hypersomnia   Seasonal and perennial allergic rhinitis    Type 2 diabetes mellitus ([place])    type 2   Urgency of urination     PAST SURGICAL HISTORY   Past Surgical History:  Procedure Laterality Date   BIOPSY  03/29/2020   Procedure: BIOPSY;  Surgeon: Clarene Essex, MD;  Location: [facility];  Service: Endoscopy;;   CARPAL TUNNEL RELEASE Bilateral 2003   COLONOSCOPY  last one 2015   CT CHEST WITH CONTRAST   ([facility])     Advanced aortic and branch vessel atherosclerosis.  Tortuous thoracic aorta. Normal heart size, without pericardial effusion. Multivessel coronary artery atherosclerosis   CYSTOSCOPY WITH RETROGRADE PYELOGRAM, URETEROSCOPY AND STENT PLACEMENT Left 04/15/2017   Procedure: CYSTOSCOPY WITH LEFT  RETROGRADE PYELOGRAM, URETEROSCOPYwith stone basketry;  Surgeon: Kathie Rhodes, MD;  Location: [place];  Service: Urology;  Laterality: Left;   D & C HYSTEROSCOPY W/ RESECTION FIBROID AND POLYP  07/30/2000   DOBUTAMINE STRESS ECHO  01-30-2008   [facility]   no ischemia/  normal wall motion/  post stress ef 79%   ESOPHAGOGASTRODUODENOSCOPY (EGD) WITH PROPOFOL N/A 01/28/2018   Procedure: ESOPHAGOGASTRODUODENOSCOPY (EGD) WITH PROPOFOL;  Surgeon: Clarene Essex, MD;  Location: [facility];  Service: Endoscopy;  Laterality: N/A;   ESOPHAGOGASTRODUODENOSCOPY (EGD) WITH PROPOFOL N/A 08/19/2019   Procedure: ESOPHAGOGASTRODUODENOSCOPY (EGD) WITH PROPOFOL;  Surgeon: Clarene Essex, MD;  Location: [facility];  Service: Endoscopy;  Laterality: N/A;   ESOPHAGOGASTRODUODENOSCOPY (EGD) WITH PROPOFOL N/A 03/29/2020   Procedure: ESOPHAGOGASTRODUODENOSCOPY (EGD) WITH PROPOFOL;  Surgeon: Clarene Essex, MD;  Location: [facility];  Service: Endoscopy;  Laterality: N/A;   FACIAL FRACTURE SURGERY  1955   trauma    HEMOSTASIS CLIP PLACEMENT  08/19/2019   Procedure: HEMOSTASIS CLIP PLACEMENT;  Surgeon: Clarene Essex, MD;  Location: [facility];  Service: Endoscopy;;   HOT HEMOSTASIS N/A 01/28/2018   Procedure: HOT HEMOSTASIS (ARGON PLASMA COAGULATION/BICAP);  Surgeon: Clarene Essex, MD;  Location: [facility];  Service: Endoscopy;  Laterality: N/A;   LAPAROSCOPIC CHOLECYSTECTOMY  08/20/2001   w/ ERCP spincterotomy w/ balloon   NM MYOVIEW LTD  7/'19; 6/'21   LOW RISK.  EF > 65%. No Ischemia or Infarction. Normal Study: Hyperdynamic.   POLYPECTOMY  08/19/2019   Procedure: POLYPECTOMY;  Surgeon: Clarene Essex, MD;  Location: [facility];  Service: Endoscopy;;   TRANSTHORACIC  ECHOCARDIOGRAM  02/13/2016   LVEF 55-60%. Gr 1 DD. ? possible bicuspid AoV w/ moderate thickened / calcified AoV w/ mild Stenosis (Mean Gradietn 15 mmHg, Valve area 1.34cm^2), no regurg.     TRANSTHORACIC ECHOCARDIOGRAM  06/2018   a) EF 55-65%. Gr 1 DD. Mild AS (? Functionally bicuspid AoV w/ mod leaflet thickening) - Mean Grad 15 mmHg, Peak 32 mmHg; b) EF 65 to 70% with dynamic mid cavity gradient due to hyperdynamic LV (peak 28 mmHg-at rest).  Moderate LVH.  Mod AS, Mean grad 27 mmHg   TRANSTHORACIC ECHOCARDIOGRAM  06/26/2021   EF> 75%.  Hyperdynamic function.  Moderate LVH.  Indeterminate filling pressures.  Normal RV.  Moderate aortic valve calcification.  Mean gradient 21 mmHg.  (Mild to Moderate AS)-previous mean gradient was 27 mmHg.  TUBAL LIGATION Bilateral yrs ago    Immunization History  Administered Date(s) Administered   Influenza Split 11/01/2011, 09/30/2012, 10/31/2013, 12/01/2015, 09/12/2016   Influenza, Quadrivalent, Recombinant, Inj, Pf 10/04/2018, 09/12/2019, 09/22/2020   Influenza-Unspecified 10/05/2014   Moderna Sars-Covid-2 Vaccination 01/23/2020, 02/20/2020, 10/27/2020   Td 12/11/2016   Zoster, Live 05/08/2010    MEDICATIONS/ALLERGIES   No outpatient medications have been marked as taking for the 09/06/21 encounter (Office Visit) with Leonie Man, MD.    Allergies  Allergen Reactions   Metformin And Related     Caused kidney issues    SOCIAL HISTORY/FAMILY HISTORY   Reviewed in Epic:  Pertinent findings:  Social History   Tobacco Use   Smoking status: Former    Packs/day: 0.25    Years: 50.00    Pack years: 12.50    Types: Cigarettes    Quit date: 12/01/2015    Years since quitting: 5.8   Smokeless tobacco: Never  Vaping Use   Vaping Use: Never used  Substance Use Topics   Alcohol use: Yes    Alcohol/week: 0.0 standard drinks    Comment: rare   Drug use: No   Social History   Social History Narrative   Divorced   Children: 1 daughter  & 1 Grand-daughter (that lives with her - freshman @ [employer])   2 yrs Secretary/administrator; 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   Caffeine: 1 drink/day exercise routine to because of back pain.    OBJCTIVE -PE, EKG, labs   Wt Readings from Last 3 Encounters:  09/06/21 183 lb 6.4 oz (83.2 kg)  07/05/21 182 lb 14.4 oz (83 kg)  06/13/21 183 lb (83 kg)    Physical Exam: BP 122/60   Pulse 79   Ht 5\' 3"  (1.6 m)   Wt 183 lb 6.4 oz (83.2 kg)   SpO2 94%   BMI 32.49 kg/m  Physical Exam Vitals reviewed.  Constitutional:      General: She is not in acute distress.    Appearance: Normal appearance. She is obese. She is not ill-appearing or toxic-appearing.  HENT:     Head: Normocephalic and atraumatic.  Neck:     Vascular: No carotid bruit (Radiated AoV murmur), hepatojugular reflux or JVD.  Cardiovascular:     Rate and Rhythm: Normal rate and regular rhythm. No extrasystoles are present.    Chest Wall: PMI is not displaced (Difficult to assess).     Pulses: No decreased pulses.     Heart sounds: S1 normal and S2 normal. Heart sounds are distant. Murmur (Harsh 2/6 C-D SEM at RUSB-neck) heard.    No gallop.  Pulmonary:     Effort: Pulmonary effort is normal. No respiratory distress.     Breath sounds: Normal breath sounds. No wheezing, rhonchi or rales.  Chest:     Chest wall: Tenderness present.  Musculoskeletal:        General: Swelling (Trivial puffy ankle swelling) present.     Cervical back: Normal range of motion and neck supple.  Skin:    General: Skin is warm and dry.  Neurological:     General: No focal deficit present.     Mental Status: She is alert and oriented to person, place, and time. Mental status is at baseline.     Gait: Gait abnormal.  Psychiatric:        Mood and Affect: Mood normal.  Behavior: Behavior normal.        Thought Content: Thought content normal.        Judgment: Judgment  normal.     Adult ECG Report  Not checked  Recent Labs:   02/08/2021: TC 150, TG 191, HDL 39, LDL 73. 08/17/2021: A1c 6.7.  No results found for: CHOL, HDL, LDLCALC, LDLDIRECT, TRIG, CHOLHDL Lab Results  Component Value Date   CREATININE 2.34 (H) 07/05/2021   BUN 24 (H) 07/05/2021   NA 142 07/05/2021   K 4.5 07/05/2021   CL 108 07/05/2021   CO2 27 07/05/2021   CBC Latest Ref Rng & Units 07/05/2021 04/15/2017 01/17/2017  WBC 4.0 - 10.5 K/uL 12.0(H) - 10.9(H)  Hemoglobin 12.0 - 15.0 g/dL 12.5 11.9(L) 11.0(L)  Hematocrit 36.0 - 46.0 % 39.9 35.0(L) 36.2  Platelets 150 - 400 K/uL 251 - 350    No results found for: HGBA1C No results found for: TSH  ==================================================  COVID-19 Education: The signs and symptoms of COVID-19 were discussed with the patient and how to seek care for testing (follow up with PCP or arrange E-visit).    I spent a total of 57minutes with the patient spent in direct patient consultation.  Additional time spent with chart review  / charting (studies, outside notes, etc): 16 min Total Time: 34 min  Current medicines are reviewed at length with the patient today.  (+/- concerns) None  This visit occurred during the SARS-CoV-2 public health emergency.  Safety protocols were in place, including screening questions prior to the visit, additional usage of staff PPE, and extensive cleaning of exam room while observing appropriate contact time as indicated for disinfecting solutions.  Notice: This dictation was prepared with Dragon dictation along with smaller phrase technology. Any transcriptional errors that result from this process are unintentional and may not be corrected upon review.  Patient Instructions / Medication Changes & Studies & Tests Ordered   Patient Instructions  Medication Instructions:  No changes *If you need a refill on your cardiac medications before your next appointment, please call your pharmacy*   Lab  Work: Not needed If you have labs (blood work) drawn today and your tests are completely normal, you will receive your results only by: MyChart Message (if you have MyChart) OR A paper copy in the mail If you have any lab test that is abnormal or we need to change your treatment, we will call you to review the results.   Testing/Procedures: Not needed   Follow-Up: At [facility], you and your health needs are our priority.  As part of our continuing mission to provide you with exceptional heart care, we have created designated Provider Care Teams.  These Care Teams include your primary Cardiologist (physician) and Advanced Practice Providers (APPs -  Physician Assistants and Nurse Practitioners) who all work together to provide you with the care you need, when you need it.  We recommend signing up for the patient portal called "MyChart".  Sign up information is provided on this After Visit Summary.  MyChart is used to connect with patients for Virtual Visits (Telemedicine).  Patients are able to view lab/test results, encounter notes, upcoming appointments, etc.  Non-urgent messages can be sent to your provider as well.   To learn more about what you can do with MyChart, go to [email or web address].    Your next appointment:   12 month(s)  The format for your next appointment:   In Person  Provider:   Glenetta Hew, MD  Studies Ordered:   No orders of the defined types were placed in this encounter.    Glenetta Hew, M.D., M.S. Interventional Cardiologist   Pager # [phone] Phone # [phone] [address] [place], [place] [address]   Thank you for choosing [facility]!!

## 2021-09-08 ENCOUNTER — Other Ambulatory Visit: Payer: Self-pay

## 2021-09-08 ENCOUNTER — Ambulatory Visit
Admission: RE | Admit: 2021-09-08 | Discharge: 2021-09-08 | Disposition: A | Discharge status: Home / Self Care | Payer: Medicare Other | Source: Ambulatory Visit | Attending: Internal Medicine | Admitting: Internal Medicine

## 2021-09-08 DIAGNOSIS — R0600 Dyspnea, unspecified: Secondary | ICD-10-CM | POA: Diagnosis not present

## 2021-09-08 DIAGNOSIS — I700 Atherosclerosis of aorta: Secondary | ICD-10-CM | POA: Diagnosis not present

## 2021-09-08 DIAGNOSIS — F17210 Nicotine dependence, cigarettes, uncomplicated: Secondary | ICD-10-CM

## 2021-09-08 DIAGNOSIS — J449 Chronic obstructive pulmonary disease, unspecified: Secondary | ICD-10-CM | POA: Diagnosis not present

## 2021-09-08 DIAGNOSIS — J9811 Atelectasis: Secondary | ICD-10-CM | POA: Diagnosis not present

## 2021-09-08 DIAGNOSIS — R911 Solitary pulmonary nodule: Secondary | ICD-10-CM | POA: Diagnosis not present

## 2021-09-08 IMAGING — CT CT CHEST W/O CM
1 series · 15 of 34 positions shown, 19 images · non-contrast
Comparison: [DATE]

CLINICAL DATA: Dyspnea, COPD, chest congestion, pulmonary nodule

EXAM:
CT CHEST WITHOUT CONTRAST
TECHNIQUE: Multidetector CT imaging of the chest was performed following the
standard protocol without IV contrast.

[Series 2: chest w/(date) · axial · 0.75mm/px · z∈[-253,-37]mm · 15 of 128 slices shown, 19 images]
[im 10/128  mediastinal]
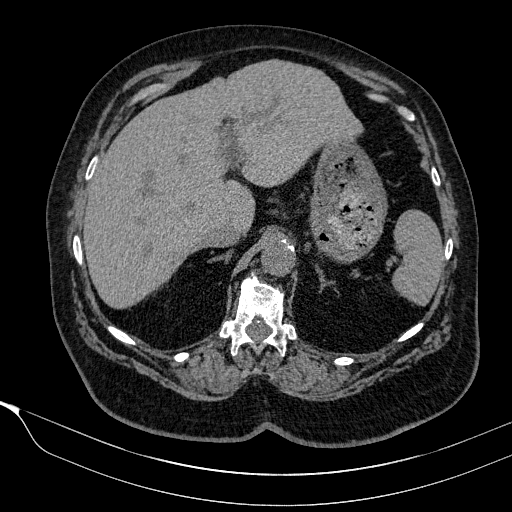
[im 10/128  lung]
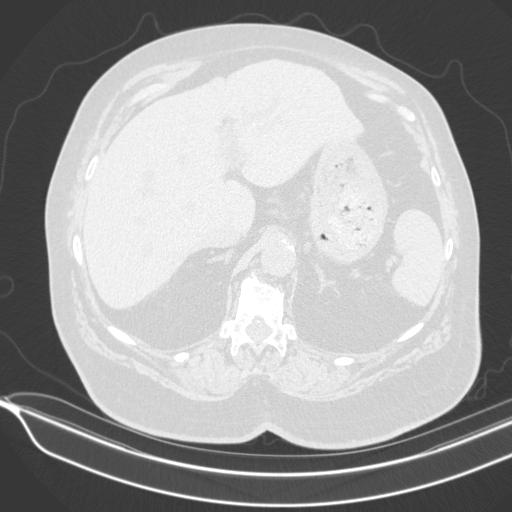
[im 19/128  lung]
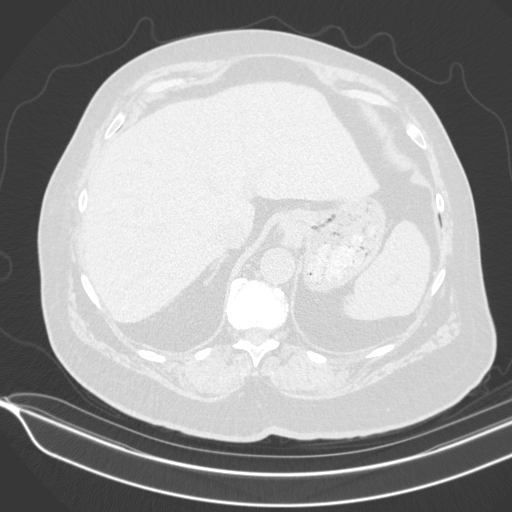
[im 26/128  lung]
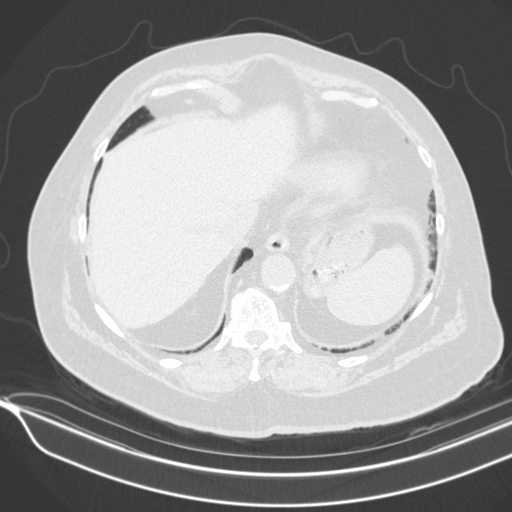
[im 33/128  lung]
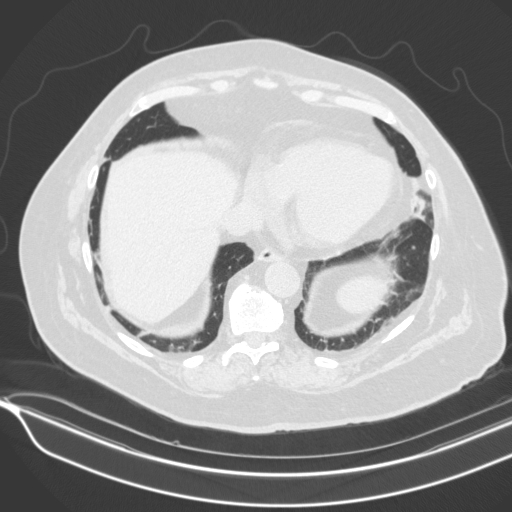
[im 43/128  mediastinal]
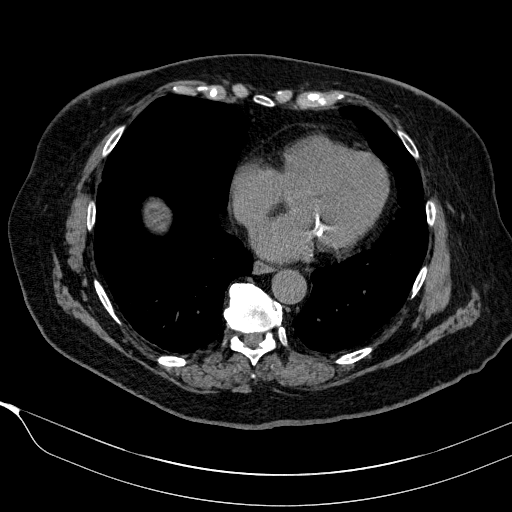
[im 43/128  lung]
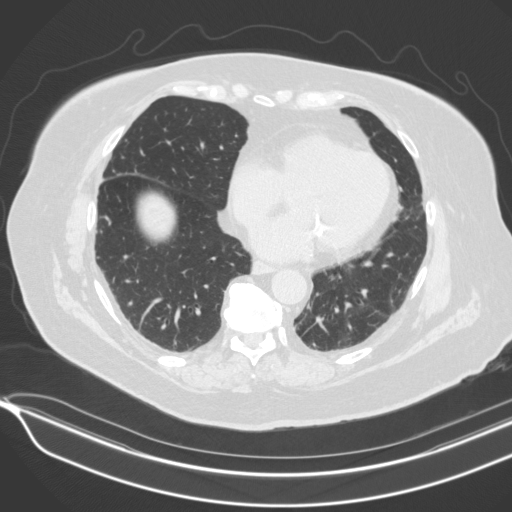
[im 51/128  lung]
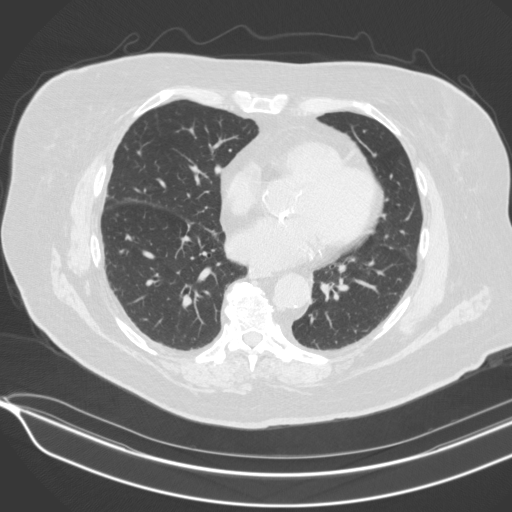
[im 57/128  lung]
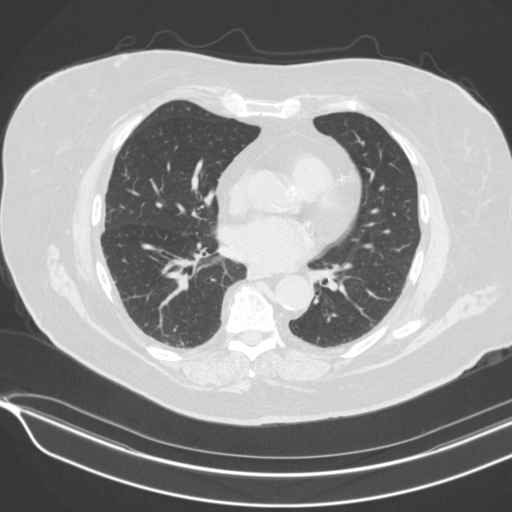
[im 66/128  lung]
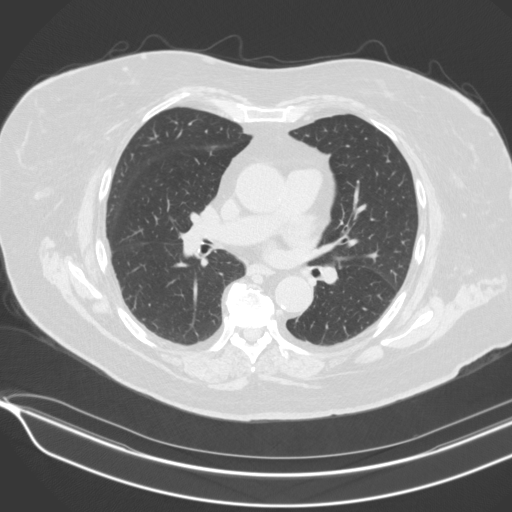
[im 71/128  mediastinal]
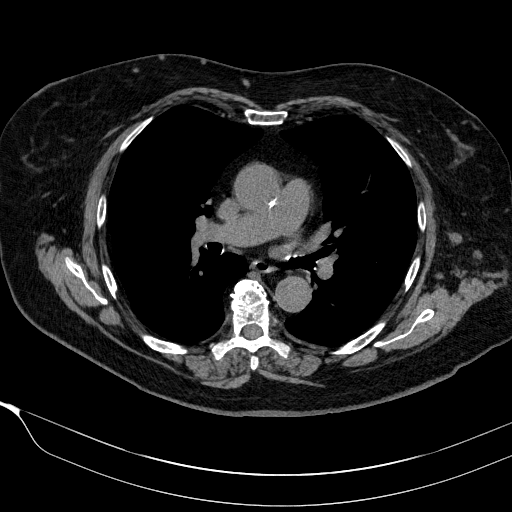
[im 71/128  lung]
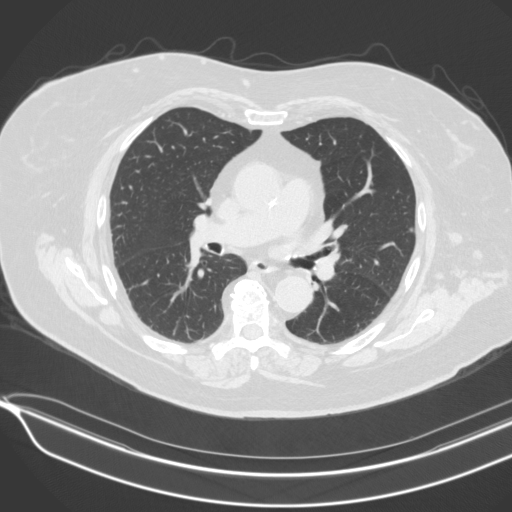
[im 77/128  lung]
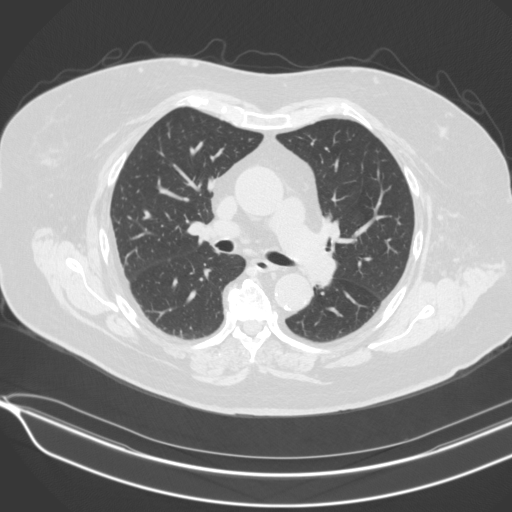
[im 85/128  lung]
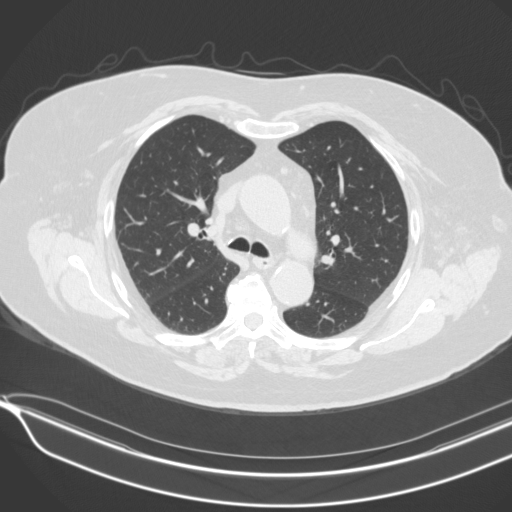
[im 95/128  lung]
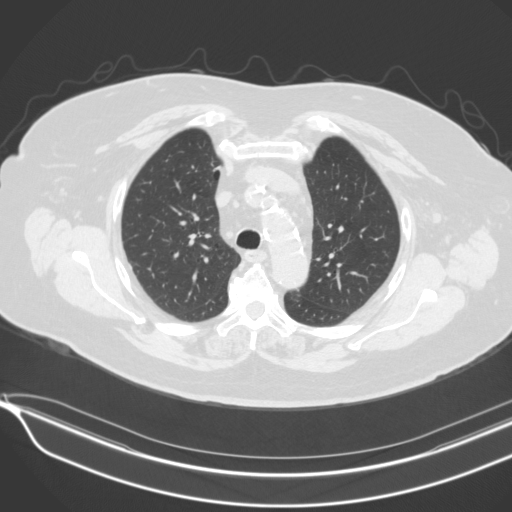
[im 102/128  mediastinal]
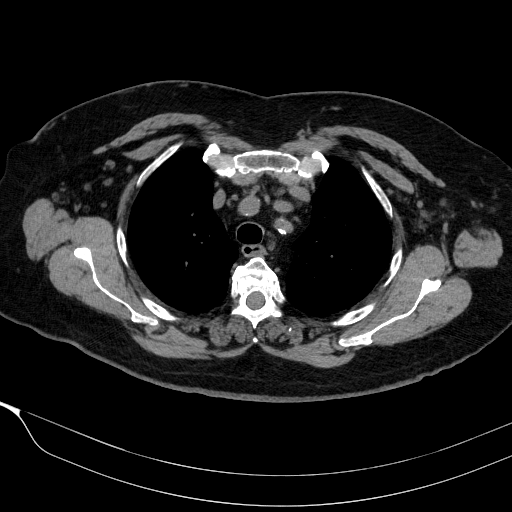
[im 102/128  lung]
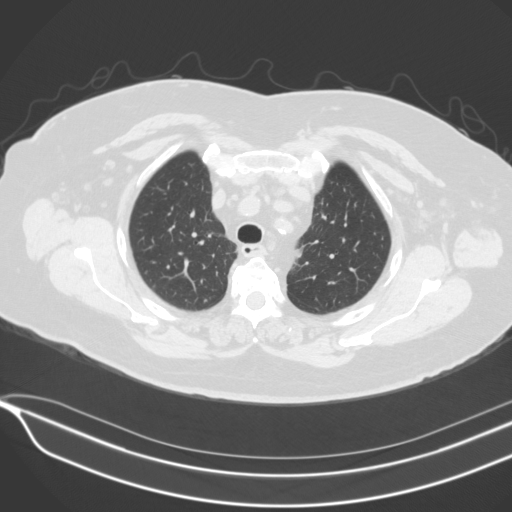
[im 109/128  lung]
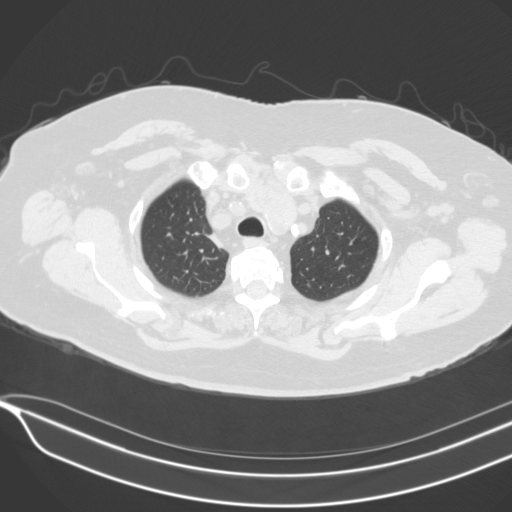
[im 118/128  lung]
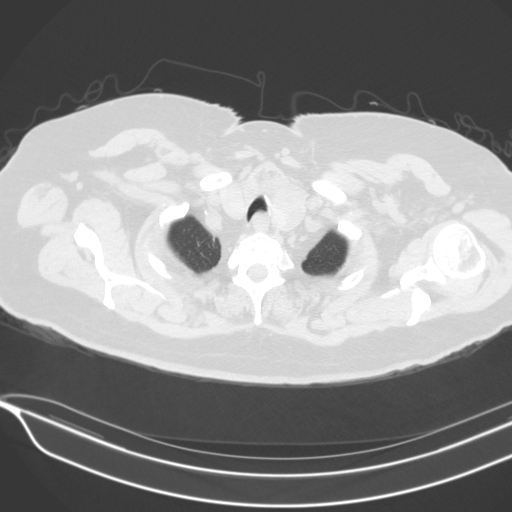

[15 of 34 positions shown; findings below may reference images not displayed]

FINDINGS: Cardiovascular: Extensive multi-vessel coronary artery
calcification. Global cardiac size within normal limits. Moderate
calcification of the aortic valve leaflets. No pericardial effusion.
The central pulmonary arteries are of normal caliber. Moderate
atherosclerotic calcification within the thoracic aorta. No aortic
aneurysm.

Mediastinum/Nodes: Multinodular goiter appears grossly unchanged
from prior examination. This was better assessed on thyroid sonogram
of [DATE]. No pathologic thoracic adenopathy. The esophagus is
unremarkable.

Lungs/Pleura: Stable subpleural pulmonary nodule within the left
upper lobe, axial image # [DATE] and 58/3. No new focal pulmonary
nodules. Minimal bibasilar atelectasis. No focal pulmonary
infiltrates. No pneumothorax or pleural effusion. Central airways
are widely patent.

Upper Abdomen: No acute abnormality.

Musculoskeletal: No lytic or blastic bone lesion. Degenerative
changes are seen within the thoracic spine.
IMPRESSION: Stable left upper lobe pulmonary nodules, considered benign given
their stability over time. Further follow-up is not required.

Multinodular goiter, better assessed on thyroid sonogram of
[DATE].

Extensive multi-vessel coronary artery calcification.

Moderate calcification of the aortic valve leaflets.
Echocardiography may be helpful to assess for valvular dysfunction.
Normal cardiac size.

Aortic Atherosclerosis ([PF]-[PF]).

## 2021-09-24 ENCOUNTER — Encounter: Payer: Self-pay | Admitting: Cardiology

## 2021-09-24 NOTE — Assessment & Plan Note (Addendum)
Blood pressure looks good on current meds -> we have previously converted from amlodipine to diltiazem to allow for some rate control.  In doing so we also increase losartan.  BP now remained stable.  No change

## 2021-09-24 NOTE — Assessment & Plan Note (Signed)
Back to back negative stress test in 2019 and 2021. No active symptoms now to suggest progression of disease.  Lipids look pretty well controlled as his blood pressure.  Plan: Continue diltiazem and losartan along with 10 mg rosuvastatin and Vascepa 2 g twice daily.  Continue to encourage her dietary adjustments and increase exercise level.  Hopefully as her back improves still improved.  She is on Trulicity which hopefully will continue to help her weight..  No longer on Farxiga-question concern of renal insufficiency and UTI issues.  On maintenance dose aspirin.

## 2021-09-24 NOTE — Assessment & Plan Note (Addendum)
Target LDL is less than 100, but goal will be less than 70.  Most recent lipids showed LDL of 73 on current doses of rosuvastatin and Vascepa.  Triglycerides also doing better.  With current levels, would not be any more aggressive.  Remains on Trulicity along with Levemir insulin for diabetes.  Last A1c recorded was in August-6.7.  Defer management to PCP.

## 2021-09-24 NOTE — Assessment & Plan Note (Addendum)
Interestingly, the gradient "improved "compared to last time now down to 21 mmHg.  She does have hyperdynamic function with moderate LVH and therefore as there is some probably mild intracavitary gradient.  Likely associated diastolic function with atrial dilation, but not really having any significant heart failure symptoms.  Plan: With stable echocardiogram, plan to go back to every other year echoes.-> Recheck echo in summer 2024. Continue losartan and diltiazem for rate control and afterload reduction.

## 2021-10-25 DIAGNOSIS — K1329 Other disturbances of oral epithelium, including tongue: Secondary | ICD-10-CM | POA: Diagnosis not present

## 2021-12-04 DIAGNOSIS — K1329 Other disturbances of oral epithelium, including tongue: Secondary | ICD-10-CM | POA: Diagnosis not present

## 2021-12-07 DIAGNOSIS — I129 Hypertensive chronic kidney disease with stage 1 through stage 4 chronic kidney disease, or unspecified chronic kidney disease: Secondary | ICD-10-CM | POA: Diagnosis not present

## 2021-12-07 DIAGNOSIS — Z794 Long term (current) use of insulin: Secondary | ICD-10-CM | POA: Diagnosis not present

## 2021-12-07 DIAGNOSIS — E1129 Type 2 diabetes mellitus with other diabetic kidney complication: Secondary | ICD-10-CM | POA: Diagnosis not present

## 2021-12-07 DIAGNOSIS — E785 Hyperlipidemia, unspecified: Secondary | ICD-10-CM | POA: Diagnosis not present

## 2021-12-07 DIAGNOSIS — N184 Chronic kidney disease, stage 4 (severe): Secondary | ICD-10-CM | POA: Diagnosis not present

## 2021-12-18 ENCOUNTER — Ambulatory Visit: Payer: Medicare Other | Admitting: Neurology

## 2021-12-27 DIAGNOSIS — Z20822 Contact with and (suspected) exposure to covid-19: Secondary | ICD-10-CM | POA: Diagnosis not present

## 2022-01-09 DIAGNOSIS — R809 Proteinuria, unspecified: Secondary | ICD-10-CM | POA: Diagnosis not present

## 2022-01-09 DIAGNOSIS — I129 Hypertensive chronic kidney disease with stage 1 through stage 4 chronic kidney disease, or unspecified chronic kidney disease: Secondary | ICD-10-CM | POA: Diagnosis not present

## 2022-01-09 DIAGNOSIS — D509 Iron deficiency anemia, unspecified: Secondary | ICD-10-CM | POA: Diagnosis not present

## 2022-01-09 DIAGNOSIS — N184 Chronic kidney disease, stage 4 (severe): Secondary | ICD-10-CM | POA: Diagnosis not present

## 2022-01-09 DIAGNOSIS — Z87442 Personal history of urinary calculi: Secondary | ICD-10-CM | POA: Diagnosis not present

## 2022-01-09 DIAGNOSIS — E1122 Type 2 diabetes mellitus with diabetic chronic kidney disease: Secondary | ICD-10-CM | POA: Diagnosis not present

## 2022-01-17 DIAGNOSIS — K1329 Other disturbances of oral epithelium, including tongue: Secondary | ICD-10-CM | POA: Diagnosis not present

## 2022-01-31 ENCOUNTER — Ambulatory Visit (INDEPENDENT_AMBULATORY_CARE_PROVIDER_SITE_OTHER): Payer: Medicare Other | Admitting: Neurology

## 2022-01-31 ENCOUNTER — Encounter: Payer: Self-pay | Admitting: Neurology

## 2022-01-31 ENCOUNTER — Telehealth: Payer: Self-pay | Admitting: Neurology

## 2022-01-31 VITALS — BP 133/74 | HR 61 | Ht 63.0 in | Wt 184.4 lb

## 2022-01-31 DIAGNOSIS — R2689 Other abnormalities of gait and mobility: Secondary | ICD-10-CM

## 2022-01-31 DIAGNOSIS — D329 Benign neoplasm of meninges, unspecified: Secondary | ICD-10-CM

## 2022-01-31 DIAGNOSIS — Z8639 Personal history of other endocrine, nutritional and metabolic disease: Secondary | ICD-10-CM

## 2022-01-31 NOTE — Patient Instructions (Signed)
It was nice to see you again today.  I am glad to hear you feel stable.  Please work towards complete smoking cessation and try to stay well hydrated and well rested.  We will do another brain MRI (without contrast) to make your findings are stable. We will call you with the test results. We will have to schedule you for this on a separate date. This test requires authorization from your insurance, and we will take care of the insurance process. We will plan a follow up in this clinic in one year.

## 2022-01-31 NOTE — Telephone Encounter (Signed)
Medicare/aarp order sent to GI, [facility] they will reach out to the patient to schedule.  °

## 2022-01-31 NOTE — Progress Notes (Signed)
Subjective:    [name] [name] ID: [name] [name] is a 81 y.o. female.  HPI    Interim history:   [name] [name] is an 81 year old right-handed woman with an underlying complex medical history of aortic stenosis, COPD, nephrolithiasis, CKD, aortic valve stenosis, squamous cell carcinoma, hypertension, hyperlipidemia, hematuria, reflux disease, right leg pain, anemia, seasonal allergies, diabetes, and obesity, who presents for follow-up consultation of [name] [name] balance problem and history of fall with injury.  The [name] [name] is unaccompanied today. I first met [name] [name] at the request of [name] [name] primary care physician on 06/13/2021, at which time [name] [name] reported a recent fall.  [name] [name] felt that [name] [name] right leg intermittently became weak.  When [name] [name] had fallen [name] [name] was on Lyrica.  [name] [name] stopped it since then.  [name] [name] had side effects on Lyrica.  [name] [name] was advised to stay better hydrated, and work on complete smoking cessation, stay consistent with [name] [name] CPAP therapy and consider using a cane for gait safety.  [name] [name] was encouraged to limit [name] [name] caffeine consumption.  [name] [name] was advised to proceed with a brain MRI as well as EMG nerve conduction velocity test of the lower extremities.    [name] [name] had a brain MRI without contrast on 06/24/2021 and I reviewed the results: IMPRESSION: This MRI of the brain without contrast shows the following: 1.   No acute findings. 2.   T2/FLAIR hyperintense foci in the hemispheres and pons consistent with mild chronic microvascular ischemic changes. 3.   Mild generalized cortical atrophy, typical for age. 4.   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 5.   11 x 4 mm dural based extra-axial mass near the right sylvian fissure consistent with a small meningioma.  There is no mass-effect.   [name] [name] was noted of the test results.  [name] [name] had an EMG and nerve conduction velocity test of the lower extremities on 06/29/2021 and I reviewed the results:   IMPRESSION:    Normal study.  No  electrodiagnostic evidence of large fiber neuropathy this time.   [name] [name] was test results.  Today, 01/31/2022: [name] [name] reports no recent falls.  [name] [name] had recent blood work on 01/09/2022 and [name] [name] brought a copy of [name] [name] results.  TIBC was slightly below normal at 246, iron 55, iron saturation within limits at 22%.  Ferritin mildly elevated at 191.  PTH in the normal range at 30.  CMP showed glucose of 129, BUN 26, creatinine elevated at 2.24, electrolytes benign, hemoglobin 12.3.  [name] [name] has had recent increase in stress, particularly affecting [name] [name] daughter's health.  [name] [name] daughter had a significant leg injury and needs additional help at the house.  [name] [name]'s significant other also had an injury related to a tractor.  [name] [name] is still occasionally smokes.  [name] [name] had a checkup with [name] [name] nephrologist and has an appointment pending with [name] [name] primary care for blood work as well.   Previously:   06/13/21: ([name] [name]) reports balance issues for the past few months.  [name] [name] reports pain and numbness affecting [name] [name] right thigh.  Sometimes [name] [name] leg feels weak on the right side, this comes and goes.  [name] [name] had recent falls, [name] [name] fell about a month ago, this was when [name] [name] was still on Lyrica.  [name] [name] reports that [name] [name] had side effects on it and felt drunk.  [name] [name] stopped the Lyrica but the day after [name] [name] stopped the Lyrica [name] [name] had another fall and fell on [name] [name] face, [name] [name] injured [name] [name] face but did not seek immediate medical attention.  [name] [name] was with [name] [name] family members at the time, fell outside and was trying to hold onto the front of the car but slipped off the hood of the car.  [name] [name] realized that [name] [name] was falling but could not stabilize herself.  [name] [name] sustained facial bruising and a cut above the right upper lip, and eventually went to [facility] to get checked out, no scan was done of [name] [name] head.  [name] [name] admits that [name] [name] does not hydrate very well with water, estimates that [name] [name] drinks about 1 cup of water.  [name] [name] drinks very little other fluids in general.  [name] [name] reports  full compliance with [name] [name] CPAP machine.  [name] [name] reports stress, [name] [name] takes care of of [name] [name] daughter and helps financially, [name] [name] has 1 granddaughter who [name] [name] supports through college.  [name] [name] also takes care of all of [name] [name] younger brother who has dyslexia.  [name] [name] helped raise his kids in the past as well.  [name] [name] has been in physical therapy in [place].  [name] [name] father died in 50 War II, mom lived to be in [name] [name] late 29s.  [name] [name] does not feel that [name] [name] has numbness or tingling or burning in [name] [name] feet.  [name] [name] most recent A1c was less than 7 as [name] [name] recalls.  [name] [name] drinks alcohol on special occasions.  [name] [name] drinks 1 cup of coffee in the morning, unsweetened tea about 2 glasses/day, restarted smoking about a month ago and smokes about 5 or 6 cigarettes/day.  [name] [name] was previously able to quit smoking cold [place] [name] [name] reports.   I reviewed your office note from 05/17/2021.  [name] [name] has been in physical therapy for back pain.  [name] [name] had a recent lumbar spine MRI without contrast on 05/05/2021 and I reviewed the results: IMPRESSION: 1. 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 2. Mild multilevel lumbar spondylosis without foraminal or canal stenosis at any level. [name] [name] had blood work through your office on 02/08/2021 and I was able to review the results: Glucose was 136, creatinine 2.1, BUN 27, otherwise benign findings, WBC was elevated at 13.91, neutrophils mildly elevated at 10.  Lipid profile showed total cholesterol of 150, triglycerides 191, HDL 39, LDL 73, apolipoprotein B was mildly elevated at 96, TSH normal at 2.28, A1c on 02/15/2021 was 8.2.  Vitamin D level on 02/15/2021 was 68.2.   [name] [name] was seen by orthopedics, Dr. [name] on 04/10/2021 and I was able to review his note.  [name] [name] reported knee pain at the time as well as lower back stiffness.  [name] [name] was found to have mild arthritic changes in the right hip and x-ray of the lower back showed pseudo spondylolisthesis of L5 on S1.  He advised [name] [name] to take Tylenol arthritis as [name] [name]  cannot take anti-inflammatory medication secondary to kidney disease and cannot take prednisone secondary to [name] [name] diabetes.  [name] [name] was advised to follow-up as needed.   [name] [name] has a history of jaw tremor and hand tremors.  [name] [name] brother has tremors as well.  [name] [name] Past Medical History Is Significant For: Past Medical History:  Diagnosis Date   Aortic valve stenosis, mild 02/2016   Echo February 2017: possible bicuspid w/ moderate thickened and calicified, AVA ~5.73 cm -no symptoms; June 2022: Hyperdynamic EF 75%.  Mild to moderate AS-mean gradient 21 mmHg   COPD (chronic obstructive pulmonary disease) (HCC)    GERD (gastroesophageal reflux disease)    no problems since started on CPAP   Hematuria    History of adenomatous polyp of colon    tubular  adnenoma's and hyperplastic polyp's 05-19-2002;  08-15-2010;  2015   History of kidney stones    multiple since 1970's   History of squamous cell carcinoma excision    05-14-2012  right arm;  04-18-2015  left ankle   HTN (hypertension)    Hyperlipidemia    Iron deficiency anemia    Left ureteral stone    OSA on CPAP    per study 10-13-2006 mild to moderate osa w/ hypersomnia   Seasonal and perennial allergic rhinitis    Type 2 diabetes mellitus ([place])    type 2   Urgency of urination     [name] [name] Past Surgical History Is Significant For: Past Surgical History:  Procedure Laterality Date   BIOPSY  03/29/2020   Procedure: BIOPSY;  Surgeon: Clarene Essex, MD;  Location: [facility];  Service: Endoscopy;;   CARPAL TUNNEL RELEASE Bilateral 2003   COLONOSCOPY  last one 2015   CT CHEST WITH CONTRAST   ([place] [facility])     Advanced aortic and branch vessel atherosclerosis. Tortuous thoracic aorta. Normal heart size, without pericardial effusion. Multivessel coronary artery atherosclerosis   CYSTOSCOPY WITH RETROGRADE PYELOGRAM, URETEROSCOPY AND STENT PLACEMENT Left 04/15/2017   Procedure: CYSTOSCOPY WITH LEFT  RETROGRADE PYELOGRAM, URETEROSCOPYwith stone  basketry;  Surgeon: Kathie Rhodes, MD;  Location: [facility];  Service: Urology;  Laterality: Left;   D & C HYSTEROSCOPY W/ RESECTION FIBROID AND POLYP  07/30/2000   DOBUTAMINE STRESS ECHO  01-30-2008   [facility]   no ischemia/  normal wall motion/  post stress ef 79%   ESOPHAGOGASTRODUODENOSCOPY (EGD) WITH PROPOFOL N/A 01/28/2018   Procedure: ESOPHAGOGASTRODUODENOSCOPY (EGD) WITH PROPOFOL;  Surgeon: Clarene Essex, MD;  Location: [facility];  Service: Endoscopy;  Laterality: N/A;   ESOPHAGOGASTRODUODENOSCOPY (EGD) WITH PROPOFOL N/A 08/19/2019   Procedure: ESOPHAGOGASTRODUODENOSCOPY (EGD) WITH PROPOFOL;  Surgeon: Clarene Essex, MD;  Location: [facility];  Service: Endoscopy;  Laterality: N/A;   ESOPHAGOGASTRODUODENOSCOPY (EGD) WITH PROPOFOL N/A 03/29/2020   Procedure: ESOPHAGOGASTRODUODENOSCOPY (EGD) WITH PROPOFOL;  Surgeon: Clarene Essex, MD;  Location: [facility];  Service: Endoscopy;  Laterality: N/A;   FACIAL FRACTURE SURGERY  1955   trauma    HEMOSTASIS CLIP PLACEMENT  08/19/2019   Procedure: HEMOSTASIS CLIP PLACEMENT;  Surgeon: Clarene Essex, MD;  Location: [facility];  Service: Endoscopy;;   HOT HEMOSTASIS N/A 01/28/2018   Procedure: HOT HEMOSTASIS (ARGON PLASMA COAGULATION/BICAP);  Surgeon: Clarene Essex, MD;  Location: [facility];  Service: Endoscopy;  Laterality: N/A;   LAPAROSCOPIC CHOLECYSTECTOMY  08/20/2001   w/ ERCP spincterotomy w/ balloon   NM MYOVIEW LTD  7/'19; 6/'21   LOW RISK.  EF > 65%. No Ischemia or Infarction. Normal Study: Hyperdynamic.   POLYPECTOMY  08/19/2019   Procedure: POLYPECTOMY;  Surgeon: Clarene Essex, MD;  Location: [facility];  Service: Endoscopy;;   TRANSTHORACIC ECHOCARDIOGRAM  02/13/2016   LVEF 55-60%. Gr 1 DD. ? possible bicuspid AoV w/ moderate thickened / calcified AoV w/ mild Stenosis (Mean Gradietn 15 mmHg, Valve area 1.34cm^2), no regurg.     TRANSTHORACIC ECHOCARDIOGRAM  06/2018   a) EF 55-65%. Gr 1 DD. Mild AS (? Functionally bicuspid  AoV w/ mod leaflet thickening) - Mean Grad 15 mmHg, Peak 32 mmHg; b) EF 65 to 70% with dynamic mid cavity gradient due to hyperdynamic LV (peak 28 mmHg-at rest).  Moderate LVH.  Mod AS, Mean grad 27 mmHg   TRANSTHORACIC ECHOCARDIOGRAM  06/26/2021   EF> 75%.  Hyperdynamic function.  Moderate LVH.  Indeterminate filling pressures.  Normal  RV.  Moderate aortic valve calcification.  Mean gradient 21 mmHg.  (Mild to Moderate AS)-previous mean gradient was 27 mmHg.   TUBAL LIGATION Bilateral yrs ago    [name] [name] Family History Is Significant For: Family History  Problem Relation Age of Onset   Alzheimer's disease Mother    Colon cancer Paternal Grandfather    Heart attack Paternal Grandfather    Brain cancer Paternal Grandmother    Heart disease Paternal Grandmother        not sure of details   Heart disease Maternal Grandmother        pacemaker, heart attack   Alzheimer's disease Maternal Grandmother    Colon cancer Paternal Uncle    Colon cancer Paternal Uncle    Colon cancer Paternal Aunt    Heart attack Maternal Uncle    Lung cancer Maternal Uncle    Hyperlipidemia Brother    Hypertension Brother     [name] [name] Social History Is Significant For: Social History   Socioeconomic History   Marital status: Divorced    Spouse name: Not on file   Number of children: Not on file   Years of education: Not on file   Highest education level: Not on file  Occupational History   Occupation: retired Engineer, water  Tobacco Use   Smoking status: Former    Packs/day: 0.25    Years: 50.00    Pack years: 12.50    Types: Cigarettes    Quit date: 12/01/2015    Years since quitting: 6.1   Smokeless tobacco: Never  Vaping Use   Vaping Use: Never used  Substance and Sexual Activity   Alcohol use: Yes    Alcohol/week: 0.0 standard drinks    Comment: rare   Drug use: No   Sexual activity: Not Currently  Other Topics Concern   Not on file  Social History Narrative   Divorced   Children: 1 daughter & 1  Grand-daughter (that lives with [name] [name] - freshman @ [employer])   2 yrs Secretary/administrator; 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   Caffeine: 1 drink/day exercise routine to because of back pain.   Social Determinants of Health   Financial Resource Strain: Not on file  Food Insecurity: Not on file  Transportation Needs: Not on file  Physical Activity: Not on file  Stress: Not on file  Social Connections: Not on file    [name] [name] Allergies Are:  Allergies  Allergen Reactions   Metformin And Related     Caused kidney issues  :   [name] [name] Current Medications Are:  Outpatient Encounter Medications as of 01/31/2022  Medication Sig   albuterol (VENTOLIN HFA) 108 (90 Base) MCG/ACT inhaler Inhale 2 puffs into the lungs every 6 (six) hours as needed for wheezing or shortness of breath.   ANORO ELLIPTA 62.5-25 MCG/INH AEPB Inhale 1 puff into the lungs daily.   aspirin EC 81 MG tablet Take 81 mg by mouth at bedtime.   Carboxymethylcellul-Glycerin (LUBRICATING EYE DROPS OP) Place 1 drop into both eyes 3 (three) times daily as needed (dry eyes).    Cholecalciferol (VITAMIN D-3) 125 MCG (5000 UT) TABS Take 5,000 Units by mouth See admin instructions. Take 1 tablet (5000 units) by mouth every evening, except Fridays.   diclofenac Sodium (VOLTAREN) 1 % GEL Apply 2 g topically 4 (four) times daily.   diltiazem (CARDIZEM CD) 240 MG 24 hr capsule TAKE 1 CAPSULE  DAILY   escitalopram (LEXAPRO) 10 MG tablet Take 10 mg by mouth every morning.    ferrous sulfate 325 (65 FE) MG tablet Take 325 mg by mouth daily with breakfast.   icosapent Ethyl (VASCEPA) 1 g capsule Take 2 capsules by mouth 2 (two) times daily.    insulin aspart (NOVOLOG FLEXPEN) 100 UNIT/ML FlexPen Inject 8 Units into the skin 3 (three) times daily with meals.   insulin detemir (LEVEMIR) 100 UNIT/ML injection Inject 40 Units into the skin daily.    losartan (COZAAR) 100 MG tablet TAKE 1 TABLET  DAILY   rosuvastatin (CRESTOR) 10 MG tablet Take 10 mg by mouth at bedtime.   traMADol (ULTRAM) 50 MG tablet Take 50 mg by mouth every 6 (six) hours as needed.   TRULICITY 1.5 EV/0.3JK SOPN Inject 1.5 mg into the skin every Wednesday.    vitamin B-12 (CYANOCOBALAMIN) 500 MCG tablet Take 500 mcg by mouth daily.   Vitamin D, Ergocalciferol, (DRISDOL) 50000 units CAPS capsule Take 50,000 Units by mouth every Friday.    zinc gluconate 50 MG tablet Take 50 mg by mouth daily.   No facility-administered encounter medications on file as of 01/31/2022.  :  Review of Systems:  Out of a complete 14 point review of systems, all are reviewed and negative with the exception of these symptoms as listed below: Review of Systems  Neurological:        Pt states [name] [name] is here for follow up visit to discuss MRI Pt states [name] [name] is during fine after [name] [name] fall last year. ;Pt states [name] [name] is always SOB.Pt states [name] [name] has not fallen since June    Objective:  Neurological Exam  Physical Exam Physical Examination:   Vitals:   01/31/22 1028  BP: 133/74  Pulse: 61   General Examination: The [name] [name] is a very pleasant 81 y.o. female in no acute distress. [name] [name] appears well-developed and well-nourished and well groomed.   HEENT: Normocephalic, atraumatic, pupils are equal, round and reactive to light and accommodation.  [name] [name] is status post bilateral cataract repairs, tracking is well-preserved, hearing grossly intact, face is symmetric with normal facial animation, no evidence of dysarthria or hypophonia.  Tongue protrudes centrally and palate elevates symmetrically, mild mouth dryness noted.  No carotid bruits.  Neck is supple with full range of motion.  [name] [name] has a intermittent lower lip and jaw tremor.  [name] [name] has a history of tremor affecting primarily [name] [name] head but also [name] [name] hands.     Chest: Clear to auscultation without wheezing, rhonchi or crackles noted.   Heart: S1+S2+0, regular and normal without murmurs, rubs or gallops  noted.    Abdomen: Soft, non-tender and non-distended with normal bowel sounds appreciated on auscultation.   Extremities: There is trace edema in the distal lower extremities bilaterally.    Skin: Warm and dry without trophic changes noted.   Musculoskeletal: exam reveals no obvious joint deformities.    Neurologically:  Mental status: The [name] [name] is awake, alert and oriented in all 4 spheres. [name] [name] immediate and remote memory, attention, language skills and fund of knowledge are appropriate. There is no evidence of aphasia, agnosia, apraxia or anomia. Speech is clear with normal prosody and enunciation. Thought process is linear. Mood is normal and affect is normal.  Cranial nerves II - XII are as described above under HEENT exam. In addition: shoulder shrug is normal with equal shoulder height noted. Motor exam: Normal bulk, strength and tone is noted. There is a very mild bilateral upper  extremity postural and action tremor, no resting tremor, no drift.  Romberg is not tested for safety concerns.  Fine motor skills and coordination: intact grossly in the upper and lower extremities.    Cerebellar testing: No dysmetria or intention tremor. There is no truncal or gait ataxia.  Sensory exam: intact to light touch in the upper and lower extremities.  Gait, station and balance: [name] [name] stands with mild difficulty, requires no assistance, [name] [name] walks slightly in securely and slowly but without evidence of shuffling and with preserved arm swing noted.  [name] [name] turns slowly and cautiously.   Assessment and Plan:    In summary, Adaleigh T Coto is a very pleasant 81 year old female with an underlying complex medical history of aortic stenosis, COPD, nephrolithiasis, CKD, aortic valve stenosis, squamous cell carcinoma, hypertension, hyperlipidemia, hematuria, reflux disease, right leg pain, anemia, seasonal allergies, diabetes, and obesity, who presents for follow-up consultation of [name] [name] balance problem and a  history of fall in 2022.  Exam is stable.  [name] [name] feels that [name] [name] balance in fact is a little better.  Examination does not support peripheral neuropathy or parkinsonism.  [name] [name] is no longer on Lyrica. [name] [name] is advised to work on optimal hydration with water, and complete smoking cessation. Work up with with an EMG and nerve conduction velocity test revealed no obvious evidence of peripheral neuropathy or radiculopathy  Brain MRI without contrast (d/t kidney impairment) showed no acute findings and incidental findings of a pineal cyst and small meningioma.  [name] [name] has had no new symptoms.  [name] [name] is advised to proceed with a repeat brain MRI to look for stability in [name] [name] pineal gland cyst as well as small meningioma.  We will call [name] [name] with [name] [name] test results.  [name] [name] is advised to follow-up routinely in 1 year in this office. I answered all [name] [name] questions today and [name] [name] was in agreement with our plan. I spent 30 minutes in total face-to-face time and in reviewing records during pre-charting, more than 50% of which was spent in counseling and coordination of care, reviewing test results, reviewing medications and treatment regimen and/or in discussing or reviewing the diagnosis of balance problem, meningioma, pineal cyst, the prognosis and treatment options. Pertinent laboratory and imaging test results that were available during this visit with the [name] [name] were reviewed by me and considered in my medical decision making (see chart for details).

## 2022-02-02 DIAGNOSIS — Z20822 Contact with and (suspected) exposure to covid-19: Secondary | ICD-10-CM | POA: Diagnosis not present

## 2022-02-05 ENCOUNTER — Other Ambulatory Visit: Payer: Self-pay

## 2022-02-05 ENCOUNTER — Ambulatory Visit
Admission: RE | Admit: 2022-02-05 | Discharge: 2022-02-05 | Disposition: A | Discharge status: Home / Self Care | Payer: Medicare Other | Source: Ambulatory Visit | Attending: Neurology | Admitting: Neurology

## 2022-02-05 DIAGNOSIS — I619 Nontraumatic intracerebral hemorrhage, unspecified: Secondary | ICD-10-CM | POA: Diagnosis not present

## 2022-02-05 DIAGNOSIS — D329 Benign neoplasm of meninges, unspecified: Secondary | ICD-10-CM | POA: Diagnosis not present

## 2022-02-05 DIAGNOSIS — R2689 Other abnormalities of gait and mobility: Secondary | ICD-10-CM

## 2022-02-05 DIAGNOSIS — E236 Other disorders of pituitary gland: Secondary | ICD-10-CM | POA: Diagnosis not present

## 2022-02-05 DIAGNOSIS — Z8639 Personal history of other endocrine, nutritional and metabolic disease: Secondary | ICD-10-CM

## 2022-02-05 IMAGING — MR MR HEAD W/O CM
10 series · 48 of 48 positions shown · non-contrast
Comparison: [DATE]

CLINICAL DATA: Brain/CNS neoplasm, monitor incidental finding of
pineal cyst and meningioma in [5E].

History of pineal cyst [5E] ([5E]-CM)
Meningioma (HCC) [5E] ([5E]-CM)
EXAM:
MRI HEAD WITHOUT CONTRAST
TECHNIQUE: Multiplanar, multiecho pulse sequences of the brain and surrounding
structures were obtained without intravenous contrast.

[Series 2: T1 · sagittal · 5.0mm · 0.45mm/px · 3 of 25 slices shown]
[im 1/25]
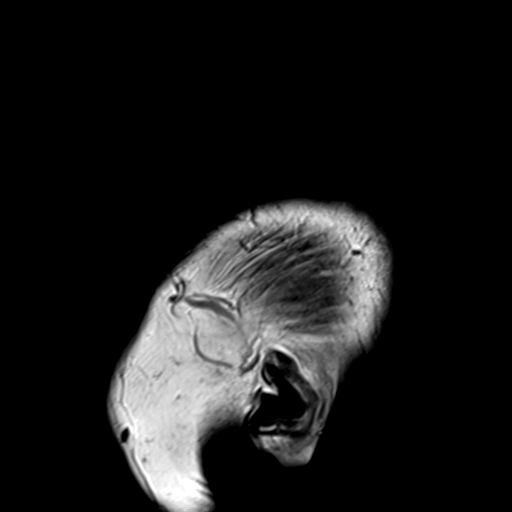
[im 13/25]
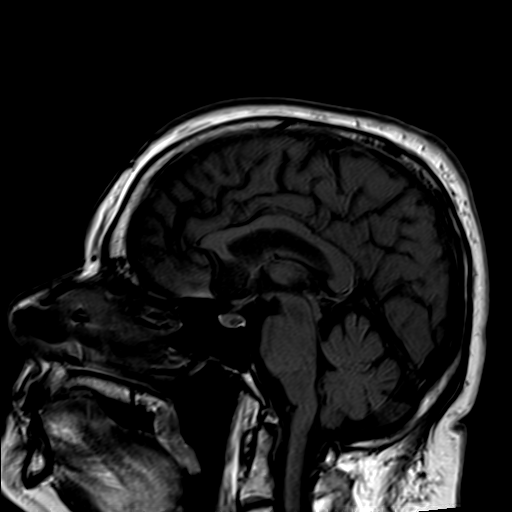
[im 25/25]
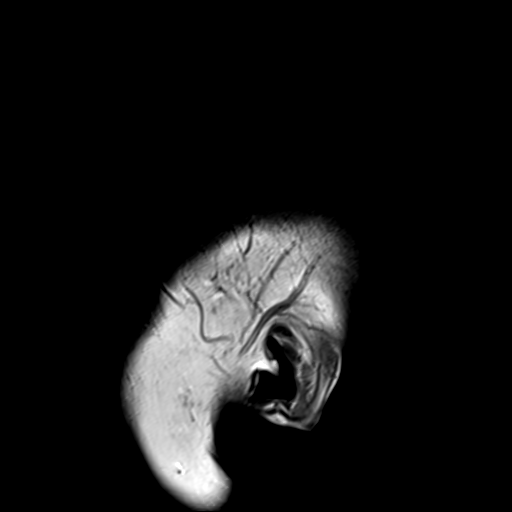

[Series 3: ax ep2d_diff_3 · axial · 3.0mm · 1.80mm/px · z∈[-80,+81]mm · 8 of 105 slices shown]
[im 1/105]
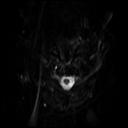
[im 15/105]
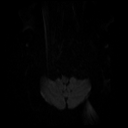
[im 30/105]
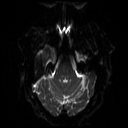
[im 45/105]
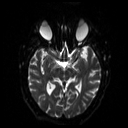
[im 60/105]
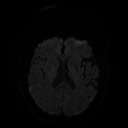
[im 75/105]
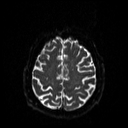
[im 90/105]
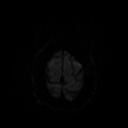
[im 105/105]
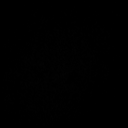

[Series 4: ax ep2d_diff_3_adc · axial · 3.0mm · 1.80mm/px · z∈[-80,+81]mm · 4 of 55 slices shown]
[im 1/55]
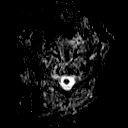
[im 19/55]
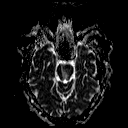
[im 37/55]
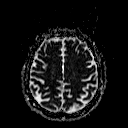
[im 55/55]
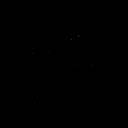

[Series 5: cor ep2d_diff · coronal · 5.0mm · 1.77mm/px · 5 of 60 slices shown]
[im 1/60]
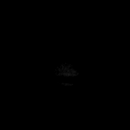
[im 15/60]
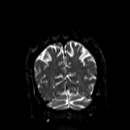
[im 30/60]
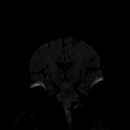
[im 45/60]
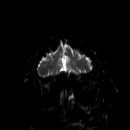
[im 60/60]
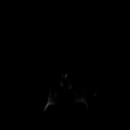

[Series 6: cor ep2d_diff_adc · coronal · 5.0mm · 1.77mm/px · 2 of 30 slices shown]
[im 1/30]
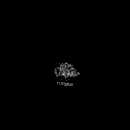
[im 30/30]
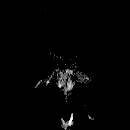

[Series 8: swi_images · axial · 2.0mm · 0.98mm/px · z∈[-77,+80]mm · 6 of 80 slices shown]
[im 1/80]
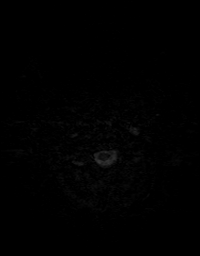
[im 16/80]
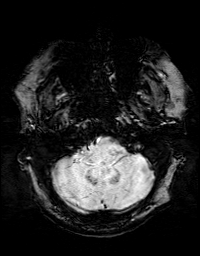
[im 32/80]
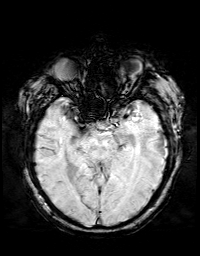
[im 48/80]
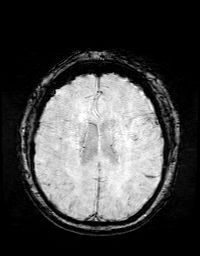
[im 64/80]
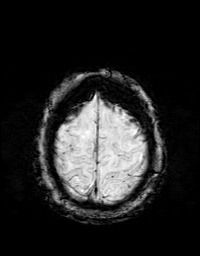
[im 80/80]
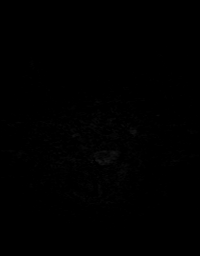

[Series 9: FLAIR · axial · 3.0mm · 0.43mm/px · z∈[-76,+76]mm · 3 of 40 slices shown]
[im 1/40]
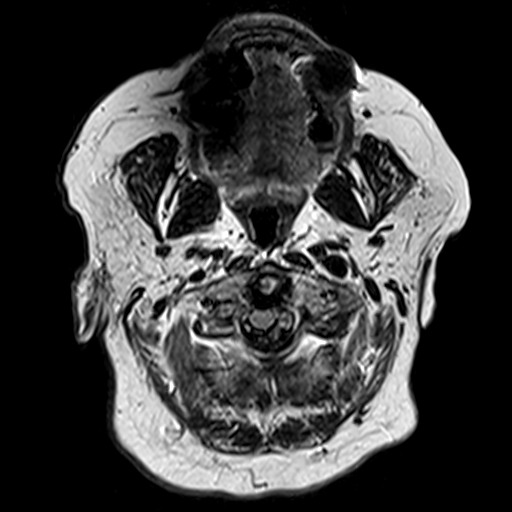
[im 20/40]
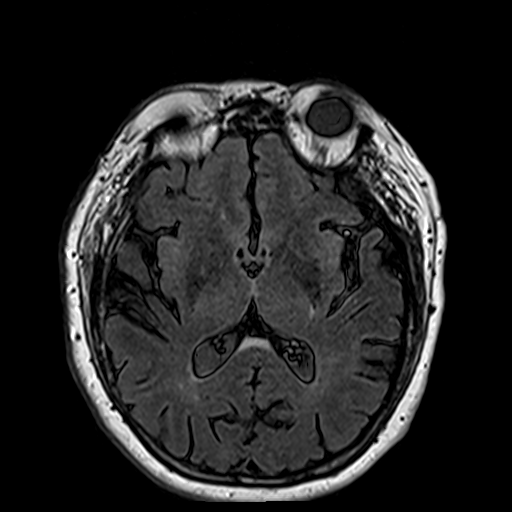
[im 40/40]
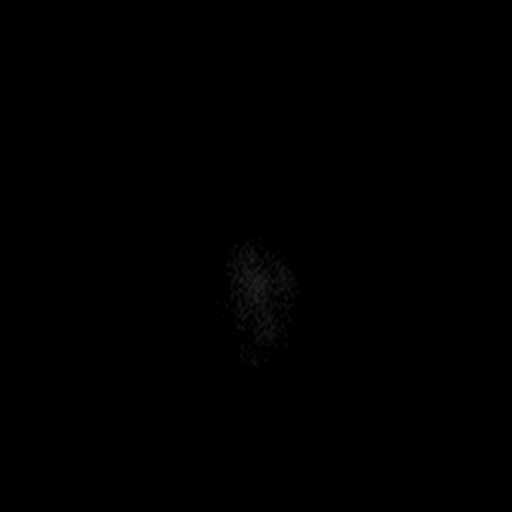

[Series 10: T2 · axial · 5.0mm · 0.65mm/px · z∈[-82,+85]mm · 2 of 29 slices shown (1 of 2)]
[im 1/29]
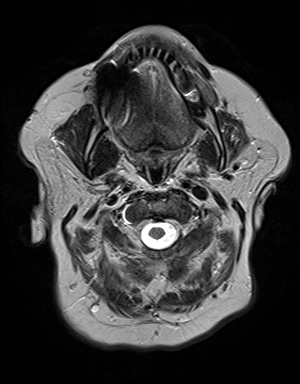
[im 29/29]
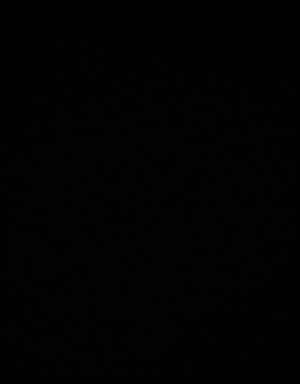

[Series 11: t1_mpr_(person_name) · axial · 1.0mm · 0.72mm/px · z∈[-78,+80]mm · 13 of 160 slices shown]
[im 1/160]
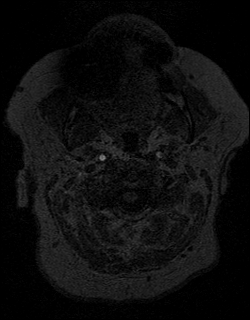
[im 14/160]
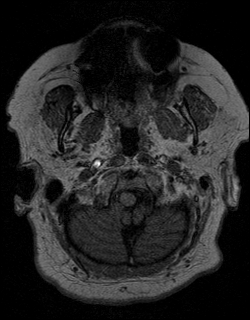
[im 27/160]
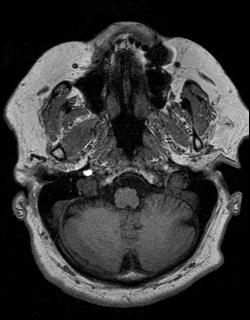
[im 40/160]
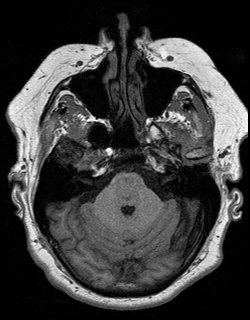
[im 54/160]
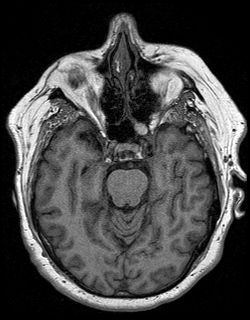
[im 67/160]
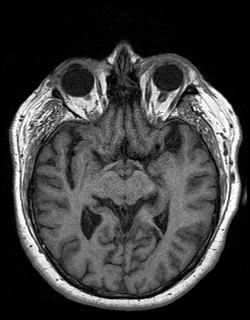
[im 80/160]
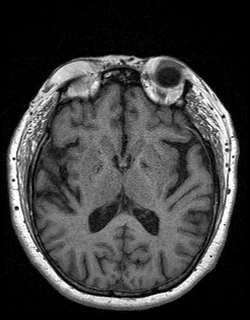
[im 93/160]
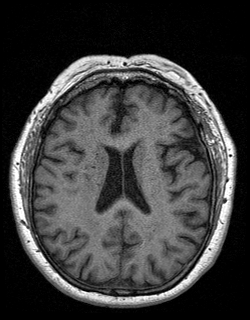
[im 107/160]
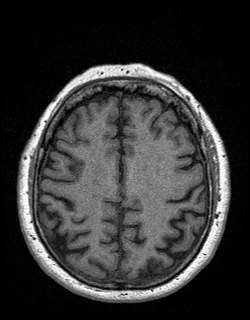
[im 120/160]
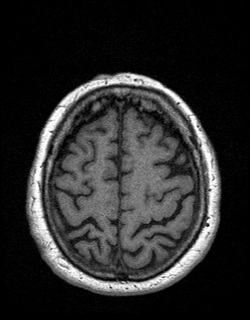
[im 133/160]
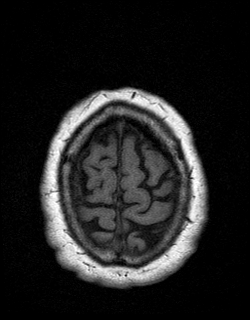
[im 146/160]
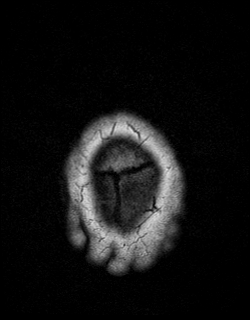
[im 160/160]
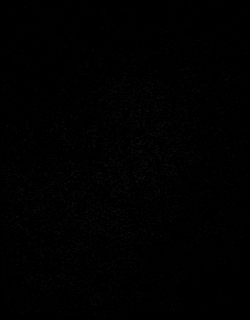

[Series 12: T2 · coronal · 5.0mm · 0.43mm/px · 2 of 28 slices shown (2 of 2)]
[im 1/28]
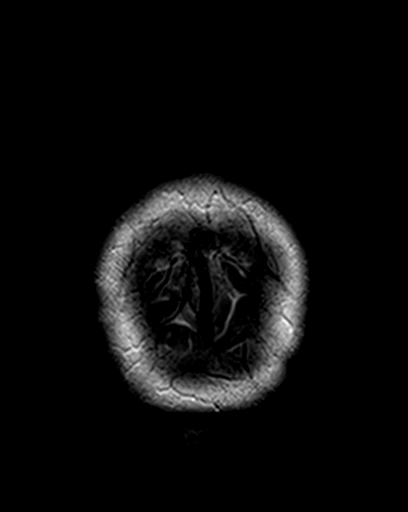
[im 28/28]
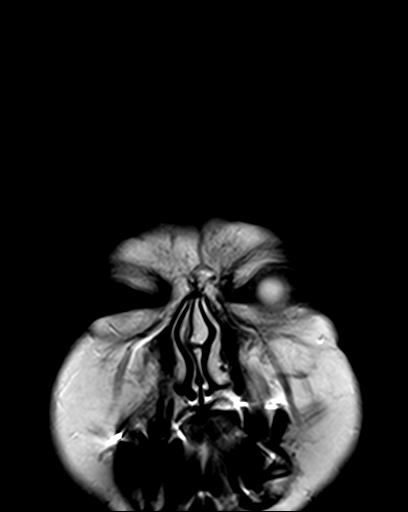

[48 of 48 positions shown; findings below may reference images not displayed]

FINDINGS: Brain: No acute infarct, mass effect or extra-axial collection. No
acute or chronic hemorrhage. There is multifocal hyperintense
T2-weighted signal within the white matter. Parenchymal volume and
CSF spaces are normal. Unchanged appearance of meningioma adjacent
to the right temporal lobe measuring 10 x 4 mm. Cystic space in the
posterior pituitary gland, likely Rathke cleft cyst.

Vascular: Major flow voids are preserved.

Skull and upper cervical spine: Normal calvarium and skull base.
Visualized upper cervical spine and soft tissues are normal.

Sinuses/Orbits:No paranasal sinus fluid levels or advanced mucosal
thickening. No mastoid or middle ear effusion. Normal orbits.
IMPRESSION: 1. No acute intracranial abnormality.
2. Unchanged appearance of 10 x 4 mm meningioma adjacent to the
right temporal lobe.
3. Unchanged Rathke's cleft cyst in the pituitary gland.

## 2022-02-15 DIAGNOSIS — M8589 Other specified disorders of bone density and structure, multiple sites: Secondary | ICD-10-CM | POA: Diagnosis not present

## 2022-02-15 DIAGNOSIS — E1129 Type 2 diabetes mellitus with other diabetic kidney complication: Secondary | ICD-10-CM | POA: Diagnosis not present

## 2022-02-15 DIAGNOSIS — I10 Essential (primary) hypertension: Secondary | ICD-10-CM | POA: Diagnosis not present

## 2022-02-15 DIAGNOSIS — E1149 Type 2 diabetes mellitus with other diabetic neurological complication: Secondary | ICD-10-CM | POA: Diagnosis not present

## 2022-02-15 DIAGNOSIS — N184 Chronic kidney disease, stage 4 (severe): Secondary | ICD-10-CM | POA: Diagnosis not present

## 2022-02-15 DIAGNOSIS — E559 Vitamin D deficiency, unspecified: Secondary | ICD-10-CM | POA: Diagnosis not present

## 2022-02-15 DIAGNOSIS — E041 Nontoxic single thyroid nodule: Secondary | ICD-10-CM | POA: Diagnosis not present

## 2022-02-15 DIAGNOSIS — I129 Hypertensive chronic kidney disease with stage 1 through stage 4 chronic kidney disease, or unspecified chronic kidney disease: Secondary | ICD-10-CM | POA: Diagnosis not present

## 2022-02-15 DIAGNOSIS — E785 Hyperlipidemia, unspecified: Secondary | ICD-10-CM | POA: Diagnosis not present

## 2022-02-22 DIAGNOSIS — I129 Hypertensive chronic kidney disease with stage 1 through stage 4 chronic kidney disease, or unspecified chronic kidney disease: Secondary | ICD-10-CM | POA: Diagnosis not present

## 2022-02-22 DIAGNOSIS — E1129 Type 2 diabetes mellitus with other diabetic kidney complication: Secondary | ICD-10-CM | POA: Diagnosis not present

## 2022-02-22 DIAGNOSIS — E785 Hyperlipidemia, unspecified: Secondary | ICD-10-CM | POA: Diagnosis not present

## 2022-02-22 DIAGNOSIS — Z1331 Encounter for screening for depression: Secondary | ICD-10-CM | POA: Diagnosis not present

## 2022-02-22 DIAGNOSIS — Z1339 Encounter for screening examination for other mental health and behavioral disorders: Secondary | ICD-10-CM | POA: Diagnosis not present

## 2022-02-22 DIAGNOSIS — M8580 Other specified disorders of bone density and structure, unspecified site: Secondary | ICD-10-CM | POA: Diagnosis not present

## 2022-02-22 DIAGNOSIS — J449 Chronic obstructive pulmonary disease, unspecified: Secondary | ICD-10-CM | POA: Diagnosis not present

## 2022-02-22 DIAGNOSIS — D329 Benign neoplasm of meninges, unspecified: Secondary | ICD-10-CM | POA: Diagnosis not present

## 2022-02-22 DIAGNOSIS — N184 Chronic kidney disease, stage 4 (severe): Secondary | ICD-10-CM | POA: Diagnosis not present

## 2022-02-22 DIAGNOSIS — Z0000 Encounter for general adult medical examination without abnormal findings: Secondary | ICD-10-CM | POA: Diagnosis not present

## 2022-02-22 DIAGNOSIS — I2510 Atherosclerotic heart disease of native coronary artery without angina pectoris: Secondary | ICD-10-CM | POA: Diagnosis not present

## 2022-02-22 DIAGNOSIS — E1149 Type 2 diabetes mellitus with other diabetic neurological complication: Secondary | ICD-10-CM | POA: Diagnosis not present

## 2022-02-22 DIAGNOSIS — Z794 Long term (current) use of insulin: Secondary | ICD-10-CM | POA: Diagnosis not present

## 2022-02-22 DIAGNOSIS — I700 Atherosclerosis of aorta: Secondary | ICD-10-CM | POA: Diagnosis not present

## 2022-02-27 ENCOUNTER — Other Ambulatory Visit (HOSPITAL_COMMUNITY): Payer: Self-pay | Admitting: Internal Medicine

## 2022-02-27 DIAGNOSIS — Z1231 Encounter for screening mammogram for malignant neoplasm of breast: Secondary | ICD-10-CM

## 2022-03-14 ENCOUNTER — Other Ambulatory Visit: Payer: Self-pay

## 2022-03-14 ENCOUNTER — Encounter: Payer: Self-pay | Admitting: Physical Therapy

## 2022-03-14 ENCOUNTER — Ambulatory Visit: Payer: Medicare Other | Attending: Internal Medicine | Admitting: Physical Therapy

## 2022-03-14 DIAGNOSIS — G8929 Other chronic pain: Secondary | ICD-10-CM | POA: Diagnosis not present

## 2022-03-14 DIAGNOSIS — R293 Abnormal posture: Secondary | ICD-10-CM | POA: Insufficient documentation

## 2022-03-14 DIAGNOSIS — M5441 Lumbago with sciatica, right side: Secondary | ICD-10-CM | POA: Insufficient documentation

## 2022-03-14 NOTE — Therapy (Signed)
[place] [facility] ?[place] ?[place], [place], [address] ?Phone: [phone]   Fax:  [phone] ? ?Physical Therapy Evaluation ? ?Patient Details  ?Name: [name] [name] ?MRN: [id number] ?Date of Birth: May 24, 1941 ?Referring Provider (PT): Marton Redwood MD. ? ? ?Encounter Date: 03/14/2022 ? ? PT End of Session - 03/14/22 1536   ? ? Visit Number 1   ? Number of Visits 12   ? Date for PT Re-Evaluation 04/25/22   ? Authorization Type FOTO AT LEAST EVERY 5TH VISIT.  PROGRESS NOTE AT 10TH VISIT.  KX MODIFIER AFTER 15 VISITS.   ? PT Start Time 1030   ? PT Stop Time 1122   ? PT Time Calculation (min) 52 min   ? Activity Tolerance Patient tolerated treatment well   ? Behavior During Therapy [facility] for tasks assessed/performed   ? ?  ?  ? ?  ? ? ?Past Medical History:  ?Diagnosis Date  ? Aortic valve stenosis, mild 02/2016  ? Echo February 2017: possible bicuspid w/ moderate thickened and calicified, AVA ~0.27 cm? -no symptoms; June 2022: Hyperdynamic EF 75%.  Mild to moderate AS-mean gradient 21 mmHg  ? COPD (chronic obstructive pulmonary disease) ([place])   ? GERD (gastroesophageal reflux disease)   ? no problems since started on CPAP  ? Hematuria   ? History of adenomatous polyp of colon   ? tubular adnenoma's and hyperplastic polyp's 05-19-2002;  08-15-2010;  2015  ? History of kidney stones   ? multiple since 1970's  ? History of squamous cell carcinoma excision   ? 05-14-2012  right arm;  04-18-2015  left ankle  ? HTN (hypertension)   ? Hyperlipidemia   ? Iron deficiency anemia   ? Left ureteral stone   ? OSA on CPAP   ? per study 10-13-2006 mild to moderate osa w/ hypersomnia  ? Seasonal and perennial allergic rhinitis   ? Type 2 diabetes mellitus ([place])   ? type 2  ? Urgency of urination   ? ? ?Past Surgical History:  ?Procedure Laterality Date  ? BIOPSY  03/29/2020  ? Procedure: BIOPSY;  Surgeon: Clarene Essex, MD;  Location: [facility];  Service: Endoscopy;;  ? CARPAL TUNNEL  RELEASE Bilateral 2003  ? COLONOSCOPY  last one 2015  ? CT CHEST WITH CONTRAST   ([place] HX)    ? Advanced aortic and branch vessel atherosclerosis. Tortuous thoracic aorta. Normal heart size, without pericardial effusion. Multivessel coronary artery atherosclerosis  ? CYSTOSCOPY WITH RETROGRADE PYELOGRAM, URETEROSCOPY AND STENT PLACEMENT Left 04/15/2017  ? Procedure: CYSTOSCOPY WITH LEFT  RETROGRADE PYELOGRAM, URETEROSCOPYwith stone basketry;  Surgeon: Kathie Rhodes, MD;  Location[facility];  Service: Urology;  Laterality: Left;  ? D & C HYSTEROSCOPY W/ RESECTION FIBROID AND POLYP  07/30/2000  ? DOBUTAMINE STRESS ECHO  01-30-2008   [facility]  ? no ischemia/  normal wall motion/  post stress ef 79%  ? ESOPHAGOGASTRODUODENOSCOPY (EGD) WITH PROPOFOL N/A 01/28/2018  ? Procedure: ESOPHAGOGASTRODUODENOSCOPY (EGD) WITH PROPOFOL;  Surgeon: Clarene Essex, MD;  [facility];  Service: Endoscopy;  Laterality: N/A;  ? ESOPHAGOGASTRODUODENOSCOPY (EGD) WITH PROPOFOL N/A 08/19/2019  ? Procedure: ESOPHAGOGASTRODUODENOSCOPY (EGD) WITH PROPOFOL;  Surgeon: Clarene Essex, MD;  [facility];  Service: Endoscopy;  Laterality: N/A;  ? ESOPHAGOGASTRODUODENOSCOPY (EGD) WITH PROPOFOL N/A 03/29/2020  ? Procedure: ESOPHAGOGASTRODUODENOSCOPY (EGD) WITH PROPOFOL;  Surgeon: Clarene Essex, MD;  [facility];  Service: Endoscopy;  Laterality: N/A;  ? [place]  ? trauma   ?  HEMOSTASIS CLIP PLACEMENT  08/19/2019  ? Procedure: HEMOSTASIS CLIP PLACEMENT;  Surgeon: Clarene Essex, MD;  [facility];  Service: Endoscopy;;  ? HOT HEMOSTASIS N/A 01/28/2018  ? Procedure: HOT HEMOSTASIS (ARGON PLASMA COAGULATION/BICAP);  Surgeon: Clarene Essex, MD;  Location[facility];  Service: Endoscopy;  Laterality: N/A;  ? LAPAROSCOPIC CHOLECYSTECTOMY  08/20/2001  ? w/ ERCP spincterotomy w/ balloon  ? NM MYOVIEW LTD  7/'19; 6/'21  ? LOW RISK.  EF > 65%. No Ischemia or Infarction. Normal Study: Hyperdynamic.   ? POLYPECTOMY  08/19/2019  ? Procedure: POLYPECTOMY;  Surgeon: Clarene Essex, MD;  [facility];  Service: Endoscopy;;  ? TRANSTHORACIC ECHOCARDIOGRAM  02/13/2016  ? LVEF 55-60%. Gr 1 DD. ? possible bicuspid AoV w/ moderate thickened / calcified AoV w/ mild Stenosis (Mean Gradietn 15 mmHg, Valve area 1.34cm^2), no regurg.    ? TRANSTHORACIC ECHOCARDIOGRAM  06/2018  ? a) EF 55-65%. Gr 1 DD. Mild AS (? Functionally bicuspid AoV w/ mod leaflet thickening) - Mean Grad 15 mmHg, Peak 32 mmHg; b) EF 65 to 70% with dynamic mid cavity gradient due to hyperdynamic LV (peak 28 mmHg-at rest).  Moderate LVH.  Mod AS, Mean grad 27 mmHg  ? TRANSTHORACIC ECHOCARDIOGRAM  06/26/2021  ? EF> 75%.  Hyperdynamic function.  Moderate LVH.  Indeterminate filling pressures.  Normal RV.  Moderate aortic valve calcification.  Mean gradient 21 mmHg.  (Mild to Moderate AS)-previous mean gradient was 27 mmHg.  ? TUBAL LIGATION Bilateral yrs ago  ? ? ?There were no vitals filed for this visit. ? ? ? Subjective Assessment - 03/14/22 1547   ? ? Subjective COVID-19 screen performed prior to patient entering clinic.  The patient presents to PT with an exacerbation of right-sided low back about a month ago.  She had PT in the past which was very helpful.  She states she re-injured herself by getting up on a stool then a chair to get a plant and fell.  She is also experiencing pain and numbness into her right lateral thigh region.  Her pain israted at a 6/10 today but can rise to a 8+/10 with movement.  Heat and cold dcerease her pain.  She has also found spinal traction to be effective in the past.   ? Pertinent History CTR, DM, COPD, GERD, HTN.   ? How long can you sit comfortably? No problem.   ? How long can you stand comfortably? Varies.   ? How long can you walk comfortably? Short community distances.  Recommend she use a cane.   ? Patient Stated Goals Get out of pain and be able to do more.   ? Currently in Pain? Yes   ? Pain Score 6     ? Pain Location Back   ? Pain Orientation Right   ? Pain Descriptors / Indicators Aching   ? Pain Type Chronic pain   ? Pain Radiating Towards Right lateral thigh.   ? Pain Onset 1 to 4 weeks ago   ? Pain Frequency Constant   ? Aggravating Factors  See above.   ? Pain Relieving Factors See above.   ? ?  ?  ? ?  ? ? ? ? ? [facility] PT Assessment - 03/14/22 0001   ? ?  ? Assessment  ? Medical Diagnosis Low back pain.   ? Referring Provider (PT) Marton Redwood MD.   ? Onset Date/Surgical Date --   ~a month ago.  ?  ? Precautions  ? Precaution  Comments I recommend patient use cane for additonal safety.   ?  ? Restrictions  ? Weight Bearing Restrictions No   ?  ? Balance Screen  ? Has the patient fallen in the past 6 months Yes   ? How many times? 2.   [place] from chair and another fall due to medication which she is no longer taking.  ? Has the patient had a decrease in activity level because of a fear of falling?  Yes   ? Is the patient reluctant to leave their home because of a fear of falling?  No   ?  ? Home Environment  ? Living Environment Private residence   ?  ? Prior Function  ? Level of Independence Independent   ?  ? Observation/Other Assessments  ? Focus on Therapeutic Outcomes (FOTO)  Complete.   ?  ? Posture/Postural Control  ? Posture/Postural Control Postural limitations   ? Postural Limitations Rounded Shoulders;Forward head   ?  ? Deep Tendon Reflexes  ? DTR Assessment Site Patella;Achilles   ? Patella DTR 2+   ? Achilles DTR 1+   ?  ? ROM / Strength  ? AROM / PROM / Strength AROM;Strength   ?  ? AROM  ? Overall AROM Comments Lumbar flexion limited by 50% and extension to 10 degrees.   ?  ? Strength  ? Overall Strength Comments Normal LE strength.   ?  ? Palpation  ? Palpation comment Tender over right low back region and sacrum.   ?  ? Special Tests  ? Other special tests Equal leg lengths.  No significant pain increase with SLR and FABER testing.   ?  ? Bed Mobility  ? Bed Mobility --   Pain transition from  supine to sit.  ?  ? Transfers  ? Comments Pain from sit to stand.  Patient using armrests.   ?  ? Ambulation/Gait  ? Gait Comments Antalgic gait with patient walking in some spinal flexion.   ? ?  ?  ? ?

## 2022-03-14 NOTE — Progress Notes (Signed)
? Patient ID: [name] [name], female    DOB: 08/07/1941, 81 y.o.   MRN: [id number] ? ?HPI ?F former smoker- Followed for allergic rhinitis, hypersomnia w/ OSA, COPD, complicated by DM, HBP, GERD ?NPSG 10/10/06 RDI/AHI 15.6/hr ?PFT 12/09/2015-moderate obstruction, diffusion severely reduced.  FVC 1.6/59%, FEV1 1.18/58%, ratio 0.74, TLC 81%, DLCO 42% ? ?---------------------------------------------------------------------------- ? ? ?03/16/21- 81 year old female former smoker followed for OSA, COPD, Lung Nodules, allergic rhinitis, complicated by DM 2, HBP, GERD, HBP, Aortic Atherosclerosis,  ?-Anoro, albuterol hfa, Melatonin ?CPAP 8/ [employer] ?Download-compliance 100%, AHI 0.8/ hr ?Body weight today-181 lbs  ?Covid vax-3 Moderna ?Flu vax-had ?-----1 year follow up OSA. Doing well on cpap.  ?Download reviewed. ?Still using SD card with name "Derenda Fennel" ?Some DOE- gets no exercise. ?Some postnasal drip. ? ?03/16/22- 81 year old female former smoker followed for OSA, COPD, Lung Nodules, allergic rhinitis, complicated by DM 2, HBP, GERD, HBP, Aortic Atherosclerosis/ CAD, Aortic Stenosis,   ?-Anoro, albuterol hfa, Melatonin ?CPAP 8/ [facility] ?Download-compliance 100%, AHI 0.8/ hr ?Body weight today-181 lbs ?Covid vax-4 Moderna ?Flu vax-had ?-----Patient is doing good, no concerns ?Still using old [facility] machine which came with SD card with someone else's name on it. This machine still works well. We discussed replacing it nd would probably change to auto 5-15 when that happens. ?Admits some DOE- nothing new. Some wheeze but no cough. Back pain mostly limits her activity.  ?She is aware of CAD, evident on CT. ?CT chest 09/11/21 (Dr Brigitte Pulse)-  ?IMPRESSION: ?Stable left upper lobe pulmonary nodules, considered benign given ?their stability over time. Further follow-up is not required. ?Multinodular goiter, better assessed on thyroid sonogram of ?09/08/2019. ?Extensive multi-vessel coronary artery calcification. ?Moderate calcification  of the aortic valve leaflets. ?Echocardiography may be helpful to assess for valvular dysfunction. ?Normal cardiac size. ?Aortic Atherosclerosis (ICD10-I70.0). ? ?Review of Systems- see HPI    + = positive ?Constitutional:   No-   weight loss, night sweats, fevers, chills, fatigue, lassitude. ?HEENT:   No-   headaches, difficulty swallowing, tooth/dental problems, sore throat,  ?                No-   sneezing, itching, ear ache, no-nasal congestion, post nasal drip,  ?CV:  No-   chest pain, orthopnea, PND, swelling in lower extremities, anasarca, dizziness, palpitations ?GI:  No-   heartburn, indigestion, abdominal pain, nausea, vomiting,  ?Resp:   No-  excess mucus,  ?           No-   productive cough,  No non-productive cough,  No-  coughing up of blood.   ?           No-   change in color of mucus.  No- wheezing.   ?Skin: No-   rash or lesions. ?GU:  ?MS:  No-   joint pain or swelling.  ?Psych:  No- change in mood or affect. No depression or anxiety.  No memory loss. ?  ?Objective:  ? Physical Exam ?General- Alert, Oriented, Affect-appropriate, Distress- none acute, +overweight ?Skin- rash-none, lesions- none, excoriation- none ?Lymphadenopathy- none ?Head- atraumatic ?           Eyes- Gross vision intact, PERRLA, conjunctivae clear secretions ?           Ears- Hearing, canals-normal ?           Nose- Clear, no-Septal dev, mucus, polyps, erosion, perforation  ?           Throat- Mallampati III-IV , mucosa-not unusually dry ,  drainage- none,  tonsils- atrophic ?Neck- flexible , trachea midline, no stridor , thyroid nl, carotid no bruit ?Chest - symmetrical excursion , unlabored ?          Heart/CV- RRR , no murmur , no gallop  , no rub, nl s1 s2 ?                          - JVD- none , edema- none, stasis changes- none, varices- none ?          Lung- + clear, unlabored, no-wheezing,cough- none, dullness-none, rub- none ?          Chest wall-  ?Abd-  ?Br/ Gen/ Rectal- Not done, not indicated ?Extrem- cyanosis-  none, clubbing, none, atrophy- none, strength- nl ?Neuro- grossly intact to observation ? ? ?   ? ? ?

## 2022-03-16 ENCOUNTER — Encounter: Payer: Self-pay | Admitting: Internal Medicine

## 2022-03-16 ENCOUNTER — Ambulatory Visit (INDEPENDENT_AMBULATORY_CARE_PROVIDER_SITE_OTHER): Payer: Medicare Other | Admitting: Internal Medicine

## 2022-03-16 ENCOUNTER — Other Ambulatory Visit: Payer: Self-pay

## 2022-03-16 DIAGNOSIS — I2510 Atherosclerotic heart disease of native coronary artery without angina pectoris: Secondary | ICD-10-CM | POA: Diagnosis not present

## 2022-03-16 DIAGNOSIS — J449 Chronic obstructive pulmonary disease, unspecified: Secondary | ICD-10-CM | POA: Diagnosis not present

## 2022-03-16 DIAGNOSIS — G4733 Obstructive sleep apnea (adult) (pediatric): Secondary | ICD-10-CM | POA: Diagnosis not present

## 2022-03-16 MED ORDER — TRELEGY ELLIPTA 100-62.5-25 MCG/ACT IN AEPB: 1.0000 | INHALATION_SPRAY | Freq: Every day | RESPIRATORY_TRACT | 0 refills | Status: AC

## 2022-03-16 NOTE — Patient Instructions (Signed)
We can continue CPAP 8. Please let us know if you need help replacing your machine. I will recommend a change of settings when the time comes. ? ?Order- sample x 2 Trelegy 100 inhaler    inhale 1 puff then rinse mouth, once daily. Use the samples up and see if you feel they helped more than the Anoro. ? ? ?

## 2022-03-17 NOTE — Assessment & Plan Note (Signed)
Try samples Trelegy 100 instead of Anoro for effect on DOE and wheeze. ?

## 2022-03-17 NOTE — Assessment & Plan Note (Signed)
Followed by cardiology 

## 2022-03-17 NOTE — Assessment & Plan Note (Addendum)
Good compliance and control. This machine was from [facility]. When she is ready, anticipate order to [place] to replace machine and change to auto 5-15. Continues to benefit from CPAP.  ?

## 2022-03-19 ENCOUNTER — Telehealth: Payer: Self-pay | Admitting: Internal Medicine

## 2022-03-19 ENCOUNTER — Ambulatory Visit: Payer: Medicare Other | Admitting: Physical Therapy

## 2022-03-19 DIAGNOSIS — G4733 Obstructive sleep apnea (adult) (pediatric): Secondary | ICD-10-CM

## 2022-03-19 NOTE — Telephone Encounter (Signed)
ATC patient to let her know that order has been placed for new CPAP machine and that order will be sent to [place] per DPR left detailed message letting her know above information. Advised her to call with any questions. Nothing further needed at this time. ?

## 2022-03-20 ENCOUNTER — Telehealth: Payer: Self-pay | Admitting: Internal Medicine

## 2022-03-21 ENCOUNTER — Encounter: Payer: Self-pay | Admitting: Physical Therapy

## 2022-03-21 ENCOUNTER — Other Ambulatory Visit: Payer: Self-pay

## 2022-03-21 ENCOUNTER — Telehealth: Payer: Self-pay | Admitting: Internal Medicine

## 2022-03-21 ENCOUNTER — Ambulatory Visit: Payer: Medicare Other | Admitting: Physical Therapy

## 2022-03-21 DIAGNOSIS — Z20822 Contact with and (suspected) exposure to covid-19: Secondary | ICD-10-CM | POA: Diagnosis not present

## 2022-03-21 DIAGNOSIS — R293 Abnormal posture: Secondary | ICD-10-CM | POA: Diagnosis not present

## 2022-03-21 DIAGNOSIS — G8929 Other chronic pain: Secondary | ICD-10-CM | POA: Diagnosis not present

## 2022-03-21 DIAGNOSIS — M5441 Lumbago with sciatica, right side: Secondary | ICD-10-CM | POA: Diagnosis not present

## 2022-03-21 NOTE — Therapy (Signed)
[place] [facility] ?[place] ?[place], [place], [address] ?Phone: [phone]   Fax:  [phone] ? ?Physical Therapy Treatment ? ?Patient Details  ?Name: [name] [name] ?MRN: [id number] ?Date of Birth: [birth year] ?Referring Provider (PT): Marton Redwood MD. ? ? ?Encounter Date: 03/21/2022 ? ? PT End of Session - 03/21/22 1118   ? ? Visit Number 2   ? Number of Visits 12   ? Date for PT Re-Evaluation 04/25/22   ? Authorization Type FOTO AT LEAST EVERY 5TH VISIT.  PROGRESS NOTE AT 10TH VISIT.  KX MODIFIER AFTER 15 VISITS.   ? PT Start Time 1116   ? PT Stop Time 1206   ? PT Time Calculation (min) 50 min   ? Activity Tolerance Patient tolerated treatment well   ? Behavior During Therapy [facility] for tasks assessed/performed   ? ?  ?  ? ?  ? ? ?Past Medical History:  ?Diagnosis Date  ? Aortic valve stenosis, mild 02/2016  ? Echo February 2017: possible bicuspid w/ moderate thickened and calicified, AVA ~8.93 cm? -no symptoms; June 2022: Hyperdynamic EF 75%.  Mild to moderate AS-mean gradient 21 mmHg  ? COPD (chronic obstructive pulmonary disease) ([place])   ? GERD (gastroesophageal reflux disease)   ? no problems since started on CPAP  ? Hematuria   ? History of adenomatous polyp of colon   ? tubular adnenoma's and hyperplastic polyp's 05-19-2002;  08-15-2010;  2015  ? History of kidney stones   ? multiple since 1970's  ? History of squamous cell carcinoma excision   ? 05-14-2012  right arm;  04-18-2015  left ankle  ? HTN (hypertension)   ? Hyperlipidemia   ? Iron deficiency anemia   ? Left ureteral stone   ? OSA on CPAP   ? per study 10-13-2006 mild to moderate osa w/ hypersomnia  ? Seasonal and perennial allergic rhinitis   ? Type 2 diabetes mellitus ([place])   ? type 2  ? Urgency of urination   ? ? ?Past Surgical History:  ?Procedure Laterality Date  ? BIOPSY  03/29/2020  ? Procedure: BIOPSY;  Surgeon: Clarene Essex, MD;  [facility];  Service: Endoscopy;;  ? CARPAL TUNNEL  RELEASE Bilateral 2003  ? COLONOSCOPY  last one 2015  ? CT CHEST WITH CONTRAST   ([place] HX)    ? Advanced aortic and branch vessel atherosclerosis. Tortuous thoracic aorta. Normal heart size, without pericardial effusion. Multivessel coronary artery atherosclerosis  ? CYSTOSCOPY WITH RETROGRADE PYELOGRAM, URETEROSCOPY AND STENT PLACEMENT Left 04/15/2017  ? Procedure: CYSTOSCOPY WITH LEFT  RETROGRADE PYELOGRAM, URETEROSCOPYwith stone basketry;  Surgeon: Kathie Rhodes, MD;  Location: [facility];  Service: Urology;  Laterality: Left;  ? D & C HYSTEROSCOPY W/ RESECTION FIBROID AND POLYP  07/30/2000  ? DOBUTAMINE STRESS ECHO  01-30-2008   [facility]  ? no ischemia/  normal wall motion/  post stress ef 79%  ? ESOPHAGOGASTRODUODENOSCOPY (EGD) WITH PROPOFOL N/A 01/28/2018  ? Procedure: ESOPHAGOGASTRODUODENOSCOPY (EGD) WITH PROPOFOL;  Surgeon: Clarene Essex, MD;  [facility];  Service: Endoscopy;  Laterality: N/A;  ? ESOPHAGOGASTRODUODENOSCOPY (EGD) WITH PROPOFOL N/A 08/19/2019  ? Procedure: ESOPHAGOGASTRODUODENOSCOPY (EGD) WITH PROPOFOL;  Surgeon: Clarene Essex, MD;  [facility];  Service: Endoscopy;  Laterality: N/A;  ? ESOPHAGOGASTRODUODENOSCOPY (EGD) WITH PROPOFOL N/A 03/29/2020  ? Procedure: ESOPHAGOGASTRODUODENOSCOPY (EGD) WITH PROPOFOL;  Surgeon: Clarene Essex, MD;  [facility];  Service: Endoscopy;  Laterality: N/A;  ? [place]  ? trauma   ?  HEMOSTASIS CLIP PLACEMENT  08/19/2019  ? Procedure: HEMOSTASIS CLIP PLACEMENT;  Surgeon: Clarene Essex, MD;  [facility];  Service: Endoscopy;;  ? HOT HEMOSTASIS N/A 01/28/2018  ? Procedure: HOT HEMOSTASIS (ARGON PLASMA COAGULATION/BICAP);  Surgeon: Clarene Essex, MD;  [facility];  Service: Endoscopy;  Laterality: N/A;  ? LAPAROSCOPIC CHOLECYSTECTOMY  08/20/2001  ? w/ ERCP spincterotomy w/ balloon  ? NM MYOVIEW LTD  7/'19; 6/'21  ? LOW RISK.  EF > 65%. No Ischemia or Infarction. Normal Study: Hyperdynamic.   ? POLYPECTOMY  08/19/2019  ? Procedure: POLYPECTOMY;  Surgeon: Clarene Essex, MD;  [facility];  Service: Endoscopy;;  ? TRANSTHORACIC ECHOCARDIOGRAM  02/13/2016  ? LVEF 55-60%. Gr 1 DD. ? possible bicuspid AoV w/ moderate thickened / calcified AoV w/ mild Stenosis (Mean Gradietn 15 mmHg, Valve area 1.34cm^2), no regurg.    ? TRANSTHORACIC ECHOCARDIOGRAM  06/2018  ? a) EF 55-65%. Gr 1 DD. Mild AS (? Functionally bicuspid AoV w/ mod leaflet thickening) - Mean Grad 15 mmHg, Peak 32 mmHg; b) EF 65 to 70% with dynamic mid cavity gradient due to hyperdynamic LV (peak 28 mmHg-at rest).  Moderate LVH.  Mod AS, Mean grad 27 mmHg  ? TRANSTHORACIC ECHOCARDIOGRAM  06/26/2021  ? EF> 75%.  Hyperdynamic function.  Moderate LVH.  Indeterminate filling pressures.  Normal RV.  Moderate aortic valve calcification.  Mean gradient 21 mmHg.  (Mild to Moderate AS)-previous mean gradient was 27 mmHg.  ? TUBAL LIGATION Bilateral yrs ago  ? ? ?There were no vitals filed for this visit. ? ? Subjective Assessment - 03/21/22 1116   ? ? Subjective Hurting so bad on Monday that she could not come to PT. Having LBP and R heel pain along lateral heel.   ? Pertinent History CTR, DM, COPD, GERD, HTN.   ? How long can you sit comfortably? No problem.   ? How long can you stand comfortably? Varies.   ? How long can you walk comfortably? Short community distances.  Recommend she use a cane.   ? Patient Stated Goals Get out of pain and be able to do more.   ? Currently in Pain? Yes   ? Pain Score 8    ? Pain Location Back   ? Pain Orientation Right;Lower   ? Pain Descriptors / Indicators Aching   ? Pain Type Chronic pain   ? Pain Radiating Towards R lateral thigh, R lateral heel   ? Pain Onset More than a month ago   ? Pain Frequency Constant   ? ?  ?  ? ?  ? ? ? ? ? [facility] PT Assessment - 03/21/22 0001   ? ?  ? Assessment  ? Medical Diagnosis Low back pain.   ? Referring Provider (PT) Marton Redwood MD.   ? Next MD Visit 08/2022   ?  ? Precautions   ? Precaution Comments I recommend patient use cane for additonal safety.   ?  ? Restrictions  ? Weight Bearing Restrictions No   ? ?  ?  ? ?  ? ? ? ? ? ? ? ? ? ? ? ? ? ? ? ? [place] Adult PT Treatment/Exercise - 03/21/22 0001   ? ?  ? Exercises  ? Exercises Lumbar   ?  ? Lumbar Exercises: Aerobic  ? Nustep L3 x10 min   ?  ? Modalities  ? Modalities Electrical Stimulation;Traction   ?  ? Electrical Stimulation  ? Electrical Stimulation Location R low  back/hip   ? Electrical Stimulation Action IFC   ? Electrical Stimulation Parameters 80-150 hz x10 min   ? Electrical Stimulation Goals Tone;Pain   ?  ? Traction  ? Type of Traction Lumbar   ? Min (lbs) 15   ? Max (lbs) 70   ? Hold Time 99   ? Rest Time 5   ? Time 15   ? ?  ?  ? ?  ? ? ? ? ? ? ? ? ? ? ? ? ? ? ? PT Long Term Goals - 03/21/22 1149   ? ?  ? PT LONG TERM GOAL #1  ? Title Independent with an HEP.   ? Time 6   ? Period Weeks   ? Status On-going   ?  ? PT LONG TERM GOAL #2  ? Title Eliminate right thigh numbness.   ? Time 6   ? Period Weeks   ? Status On-going   ?  ? PT LONG TERM GOAL #3  ? Title Stand in erect posture.   ? Time 6   ? Period Weeks   ? Status On-going   ?  ? PT LONG TERM GOAL #4  ? Title Perform ADL's with pain not > 3-4/10.   ? Time 6   ? Period Weeks   ? Status On-going   ? ?  ?  ? ?  ? ? ? ? ? ? ? ? Plan - 03/21/22 1208   ? ? Clinical Impression Statement Patient presented in clinic with cotinued high level R LBP. Now patient reporting R lateral heel pain as well. Light and conservative treatment completed in order to monitor symptoms and not exaggerate symptoms. No complaints during treatment today and POC completed today helped in the past. Normal modalities response noted following removal of the modalities.   ? Personal Factors and Comorbidities Comorbidity 1;Other   ? Comorbidities CTR, DM, COPD, GERD, HTN.   ? Examination-Activity Limitations Bed Mobility;Transfers;Locomotion Level;Other   ? Examination-Participation Restrictions  Other;Meal Prep   ? Stability/Clinical Decision Making Evolving/Moderate complexity   ? Rehab Potential Good   ? PT Frequency 2x / week   ? PT Duration 6 weeks   ? PT Treatment/Interventions ADLs/Self Care Home M

## 2022-03-21 NOTE — Telephone Encounter (Signed)
Called patient but call went straight to VM. Left a message for her to call us back.  ?

## 2022-03-22 NOTE — Telephone Encounter (Signed)
We do not have a copy of patient's sleep study on file. I called [employer] and spoke with Chastity. She will send an email to the cpap department to see if they can locate it and send it to our office. I have provided her with our fax number.  ? ?Nothing further needed at time of call.  ?

## 2022-03-26 ENCOUNTER — Telehealth: Payer: Self-pay | Admitting: Internal Medicine

## 2022-03-26 ENCOUNTER — Encounter: Payer: Self-pay | Admitting: Physical Therapy

## 2022-03-26 ENCOUNTER — Ambulatory Visit: Payer: Medicare Other | Admitting: Physical Therapy

## 2022-03-26 ENCOUNTER — Other Ambulatory Visit: Payer: Self-pay

## 2022-03-26 DIAGNOSIS — R293 Abnormal posture: Secondary | ICD-10-CM

## 2022-03-26 DIAGNOSIS — M5441 Lumbago with sciatica, right side: Secondary | ICD-10-CM | POA: Diagnosis not present

## 2022-03-26 DIAGNOSIS — G8929 Other chronic pain: Secondary | ICD-10-CM

## 2022-03-26 NOTE — Therapy (Signed)
[place] ?[facility] ?[place] ?[place], [place], [address] ?Phone: [phone]   Fax:  [phone] ? ?Physical Therapy Treatment ? ?Patient Details  ?Name: [name] [name] ?MRN: [id number] ?Date of Birth: 11-19-1941 ?Referring Provider (PT): Marton Redwood MD. ? ? ?Encounter Date: 03/26/2022 ? ? PT End of Session - 03/26/22 6962   ? ? Visit Number 3   ? Number of Visits 12   ? Date for PT Re-Evaluation 04/25/22   ? Authorization Type FOTO AT LEAST EVERY 5TH VISIT.  PROGRESS NOTE AT 10TH VISIT.  KX MODIFIER AFTER 15 VISITS.   ? PT Start Time [phone]   ? PT Stop Time 1038   ? PT Time Calculation (min) 52 min   ? Activity Tolerance Patient tolerated treatment well   ? Behavior During Therapy [facility] for tasks assessed/performed   ? ?  ?  ? ?  ? ? ?Past Medical History:  ?Diagnosis Date  ? Aortic valve stenosis, mild 02/2016  ? Echo February 2017: possible bicuspid w/ moderate thickened and calicified, AVA ~4.13 cm? -no symptoms; June 2022: Hyperdynamic EF 75%.  Mild to moderate AS-mean gradient 21 mmHg  ? COPD (chronic obstructive pulmonary disease) ([place])   ? GERD (gastroesophageal reflux disease)   ? no problems since started on CPAP  ? Hematuria   ? History of adenomatous polyp of colon   ? tubular adnenoma's and hyperplastic polyp's 05-19-2002;  08-15-2010;  2015  ? History of kidney stones   ? multiple since 1970's  ? History of squamous cell carcinoma excision   ? 05-14-2012  right arm;  04-18-2015  left ankle  ? HTN (hypertension)   ? Hyperlipidemia   ? Iron deficiency anemia   ? Left ureteral stone   ? OSA on CPAP   ? per study 10-13-2006 mild to moderate osa w/ hypersomnia  ? Seasonal and perennial allergic rhinitis   ? Type 2 diabetes mellitus ([place])   ? type 2  ? Urgency of urination   ? ? ?Past Surgical History:  ?Procedure Laterality Date  ? BIOPSY  03/29/2020  ? Procedure: BIOPSY;  Surgeon: Clarene Essex, MD;  Location: [facility];  Service: Endoscopy;;  ? CARPAL TUNNEL  RELEASE Bilateral 2003  ? COLONOSCOPY  last one 2015  ? CT CHEST WITH CONTRAST   ([place] HX)    ? Advanced aortic and branch vessel atherosclerosis. Tortuous thoracic aorta. Normal heart size, without pericardial effusion. Multivessel coronary artery atherosclerosis  ? CYSTOSCOPY WITH RETROGRADE PYELOGRAM, URETEROSCOPY AND STENT PLACEMENT Left 04/15/2017  ? Procedure: CYSTOSCOPY WITH LEFT  RETROGRADE PYELOGRAM, URETEROSCOPYwith stone basketry;  Surgeon: Kathie Rhodes, MD;  Location: [facility];  Service: Urology;  Laterality: Left;  ? D & C HYSTEROSCOPY W/ RESECTION FIBROID AND POLYP  07/30/2000  ? DOBUTAMINE STRESS ECHO  01-30-2008   [facility]  ? no ischemia/  normal wall motion/  post stress ef 79%  ? ESOPHAGOGASTRODUODENOSCOPY (EGD) WITH PROPOFOL N/A 01/28/2018  ? Procedure: ESOPHAGOGASTRODUODENOSCOPY (EGD) WITH PROPOFOL;  Surgeon: Clarene Essex, MD;  Location: [facility];  Service: Endoscopy;  Laterality: N/A;  ? ESOPHAGOGASTRODUODENOSCOPY (EGD) WITH PROPOFOL N/A 08/19/2019  ? Procedure: ESOPHAGOGASTRODUODENOSCOPY (EGD) WITH PROPOFOL;  Surgeon: Clarene Essex, MD;  [facility]: [facility];  Service: Endoscopy;  Laterality: N/A;  ? ESOPHAGOGASTRODUODENOSCOPY (EGD) WITH PROPOFOL N/A 03/29/2020  ? Procedure: ESOPHAGOGASTRODUODENOSCOPY (EGD) WITH PROPOFOL;  Surgeon: Clarene Essex, MD;  [facility]: [facility];  Service: Endoscopy;  Laterality: N/A;  ? [place]  ? trauma   ?  HEMOSTASIS CLIP PLACEMENT  08/19/2019  ? Procedure: HEMOSTASIS CLIP PLACEMENT;  Surgeon: Clarene Essex, MD;  [facility]: [facility];  Service: Endoscopy;;  ? HOT HEMOSTASIS N/A 01/28/2018  ? Procedure: HOT HEMOSTASIS (ARGON PLASMA COAGULATION/BICAP);  Surgeon: Clarene Essex, MD;  Location: [facility];  Service: Endoscopy;  Laterality: N/A;  ? LAPAROSCOPIC CHOLECYSTECTOMY  08/20/2001  ? w/ ERCP spincterotomy w/ balloon  ? NM MYOVIEW LTD  7/'19; 6/'21  ? LOW RISK.  EF > 65%. No Ischemia or Infarction. Normal Study: Hyperdynamic.   ? POLYPECTOMY  08/19/2019  ? Procedure: POLYPECTOMY;  Surgeon: Clarene Essex, MD;  [facility]: [facility];  Service: Endoscopy;;  ? TRANSTHORACIC ECHOCARDIOGRAM  02/13/2016  ? LVEF 55-60%. Gr 1 DD. ? possible bicuspid AoV w/ moderate thickened / calcified AoV w/ mild Stenosis (Mean Gradietn 15 mmHg, Valve area 1.34cm^2), no regurg.    ? TRANSTHORACIC ECHOCARDIOGRAM  06/2018  ? a) EF 55-65%. Gr 1 DD. Mild AS (? Functionally bicuspid AoV w/ mod leaflet thickening) - Mean Grad 15 mmHg, Peak 32 mmHg; b) EF 65 to 70% with dynamic mid cavity gradient due to hyperdynamic LV (peak 28 mmHg-at rest).  Moderate LVH.  Mod AS, Mean grad 27 mmHg  ? TRANSTHORACIC ECHOCARDIOGRAM  06/26/2021  ? EF> 75%.  Hyperdynamic function.  Moderate LVH.  Indeterminate filling pressures.  Normal RV.  Moderate aortic valve calcification.  Mean gradient 21 mmHg.  (Mild to Moderate AS)-previous mean gradient was 27 mmHg.  ? TUBAL LIGATION Bilateral yrs ago  ? ? ?There were no vitals filed for this visit. ? ? Subjective Assessment - 03/26/22 0951   ? ? Subjective Having a lot of pain today but more in her R hip. Yesterday reports her pain as 10/10. Knows walking may help but walking causes more pain.   ? Pertinent History CTR, DM, COPD, GERD, HTN.   ? How long can you sit comfortably? No problem.   ? How long can you stand comfortably? Varies.   ? How long can you walk comfortably? Short community distances.  Recommend she use a cane.   ? Patient Stated Goals Get out of pain and be able to do more.   ? Currently in Pain? Yes   ? Pain Score 5    ? Pain Location Hip   ? Pain Orientation Right   ? Pain Descriptors / Indicators Aching;Discomfort   ? Pain Type Chronic pain   ? Pain Onset More than a month ago   ? Pain Frequency Constant   ? ?  ?  ? ?  ? ? ? ? ? ? ? ? ? ? ? ? ? ? ? ? ? ? ? ? [place] Adult PT Treatment/Exercise - 03/26/22 0001   ? ?  ? Lumbar Exercises: Aerobic  ? Nustep L3 x13 min   ?  ? Modalities  ? Modalities Electrical  Stimulation;Traction   ?  ? Electrical Stimulation  ? Electrical Stimulation Location B low back   ? Electrical Stimulation Action IFC   ? Electrical Stimulation Parameters 80-150 hz x10 min   ? Electrical Stimulation Goals Tone;Pain   ?  ? Traction  ? Type of Traction Lumbar   ? Min (lbs) 15   ? Max (lbs) 75   ? Hold Time 99   ? Rest Time 5   ? Time 15   ? ?  ?  ? ?  ? ? ? ? ? ? ? ? ? ? ? ? ? ? ? PT Long  Term Goals - 03/21/22 1149   ? ?  ? PT LONG TERM GOAL #1  ? Title Independent with an HEP.   ? Time 6   ? Period Weeks   ? Status On-going   ?  ? PT LONG TERM GOAL #2  ? Title Eliminate right thigh numbness.   ? Time 6   ? Period Weeks   ? Status On-going   ?  ? PT LONG TERM GOAL #3  ? Title Stand in erect posture.   ? Time 6   ? Period Weeks   ? Status On-going   ?  ? PT LONG TERM GOAL #4  ? Title Perform ADL's with pain not > 3-4/10.   ? Time 6   ? Period Weeks   ? Status On-going   ? ?  ?  ? ?  ? ? ? ? ? ? ? ? Plan - 03/26/22 1126   ? ? Clinical Impression Statement Patient presented in clinic with reports of more R hip pain today. Patient reporting way more pain overall today. After review of all xrays and imaging from 2022 in which is was concluded of R hip osteoarthritis and L4-L5 degeneration and anteriolithesis. Minimal increase in lumbar traction max pull. Patient requires mod-max assist for transition from supine <> sit. Electrical stimulation completed in sitting per patient comfort during transfers.   ? Personal Factors and Comorbidities Comorbidity 1;Other   ? Comorbidities CTR, DM, COPD, GERD, HTN.   ? Examination-Activity Limitations Bed Mobility;Transfers;Locomotion Level;Other   ? Examination-Participation Restrictions Other;Meal Prep   ? Stability/Clinical Decision Making Evolving/Moderate complexity   ? Rehab Potential Good   ? PT Frequency 2x / week   ? PT Duration 6 weeks   ? PT Treatment/Interventions ADLs/Self Care Home Management;Cryotherapy;Electrical Stimulation;Ultrasound;Traction;Moist  Heat;Functional mobility training;Therapeutic activities;Therapeutic exercise;Manual techniques;Patient/family education;Passive range of motion;Dry needling   ? PT Next Visit Plan Combo e'stim/US; STW/M, core exercise progre

## 2022-03-27 NOTE — Telephone Encounter (Signed)
No sleep study found on patient in chart. ATC Jessica to notify. LMTCB  ?

## 2022-03-27 NOTE — Telephone Encounter (Signed)
Lm for Janett Billow with [facility]  ?

## 2022-03-28 ENCOUNTER — Ambulatory Visit (HOSPITAL_COMMUNITY)
Admission: RE | Admit: 2022-03-28 | Discharge: 2022-03-28 | Disposition: A | Discharge status: Home / Self Care | Payer: Medicare Other | Source: Ambulatory Visit | Attending: Internal Medicine | Admitting: Internal Medicine

## 2022-03-28 DIAGNOSIS — Z1231 Encounter for screening mammogram for malignant neoplasm of breast: Secondary | ICD-10-CM | POA: Insufficient documentation

## 2022-03-28 IMAGING — MG MM DIGITAL SCREENING BILAT W/ TOMO AND CAD
6 of 10 series · 6 of 30 positions shown · non-contrast
Comparison: Previous exam(s).

ACR Breast Density Category a: The breast tissue is almost entirely
fatty.

CLINICAL DATA: Screening.

EXAM:
DIGITAL SCREENING BILATERAL MAMMOGRAM WITH TOMOSYNTHESIS AND CAD
TECHNIQUE: Bilateral screening digital craniocaudal and mediolateral oblique
mammograms were obtained. Bilateral screening digital breast
tomosynthesis was performed. The images were evaluated with
computer-aided detection.

[R MLO synth-2D (1 of 2)]
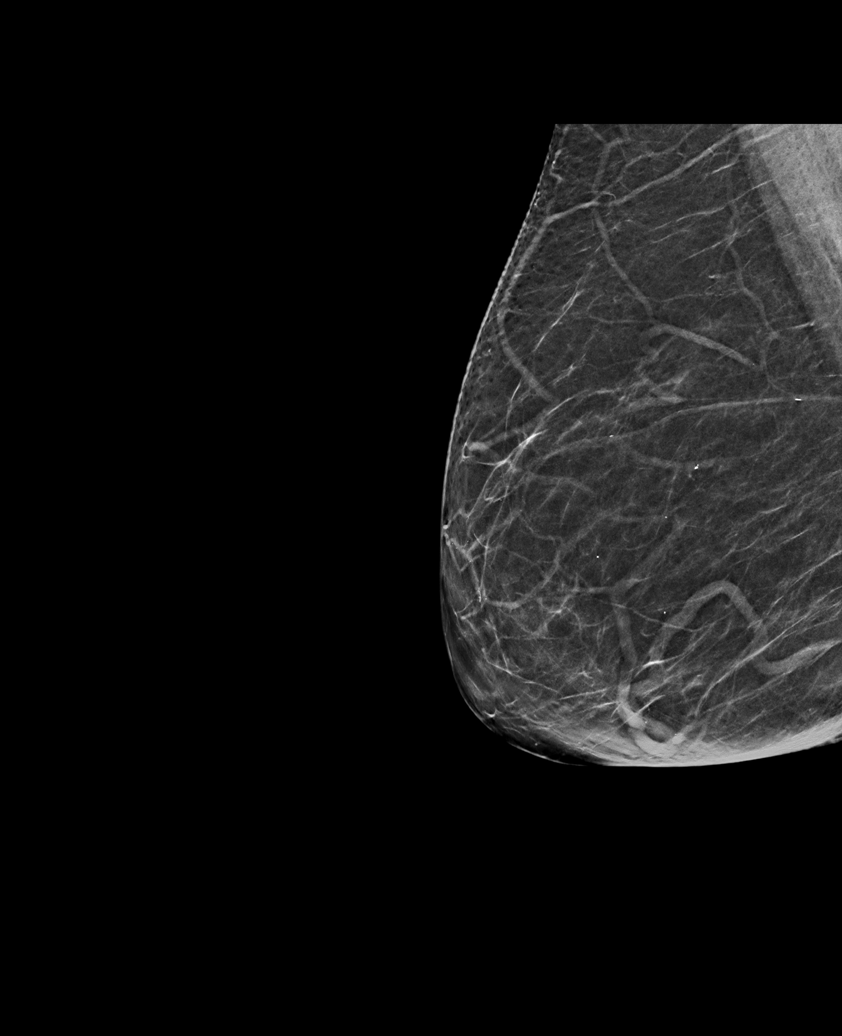

[R MLO synth-2D (2 of 2)]
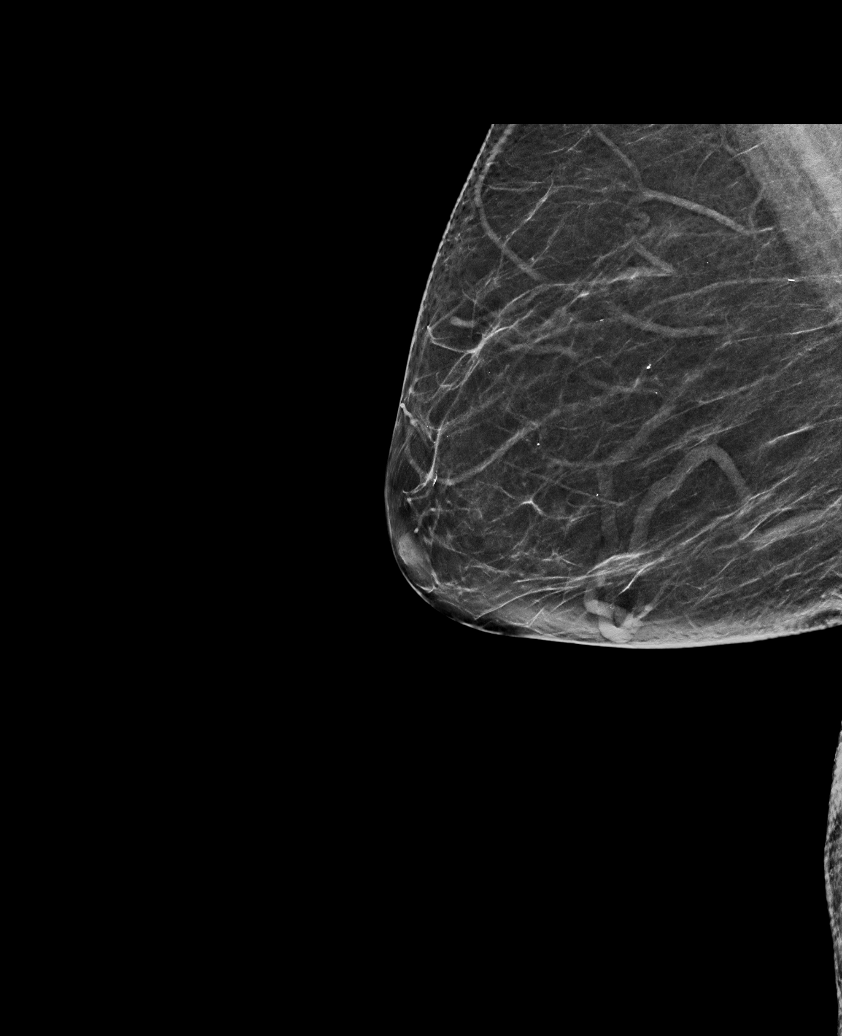

[R CC synth-2D]
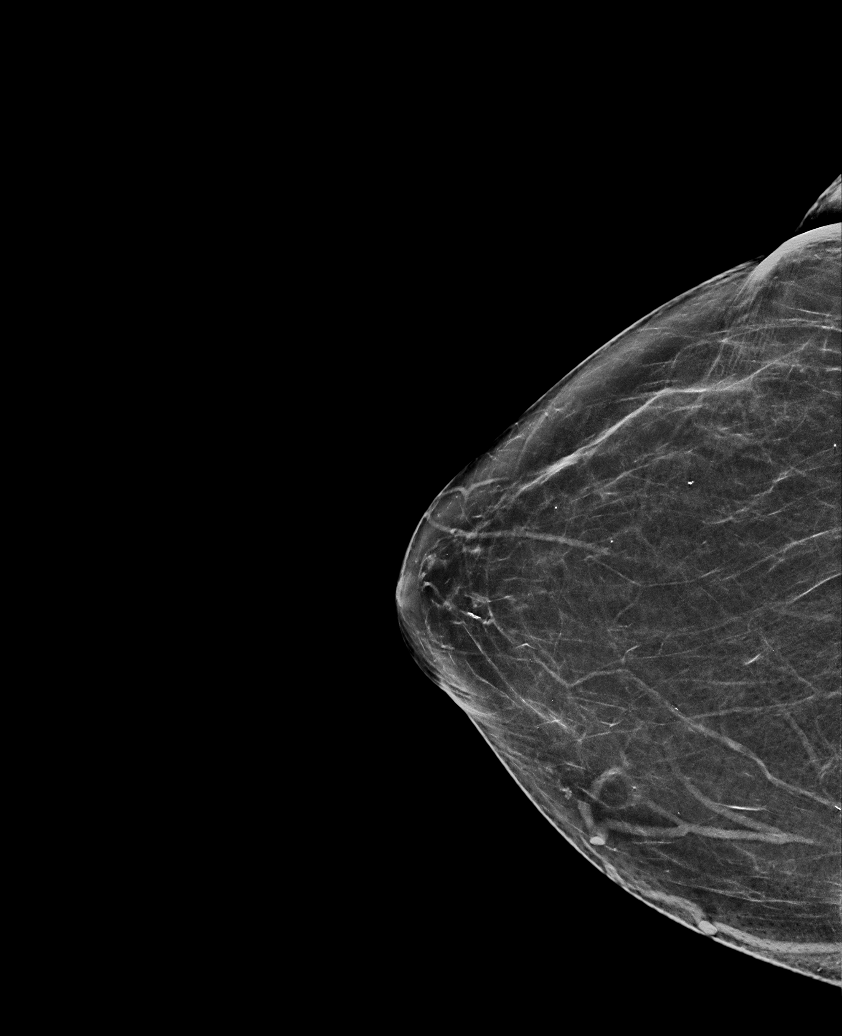

[L CC synth-2D]
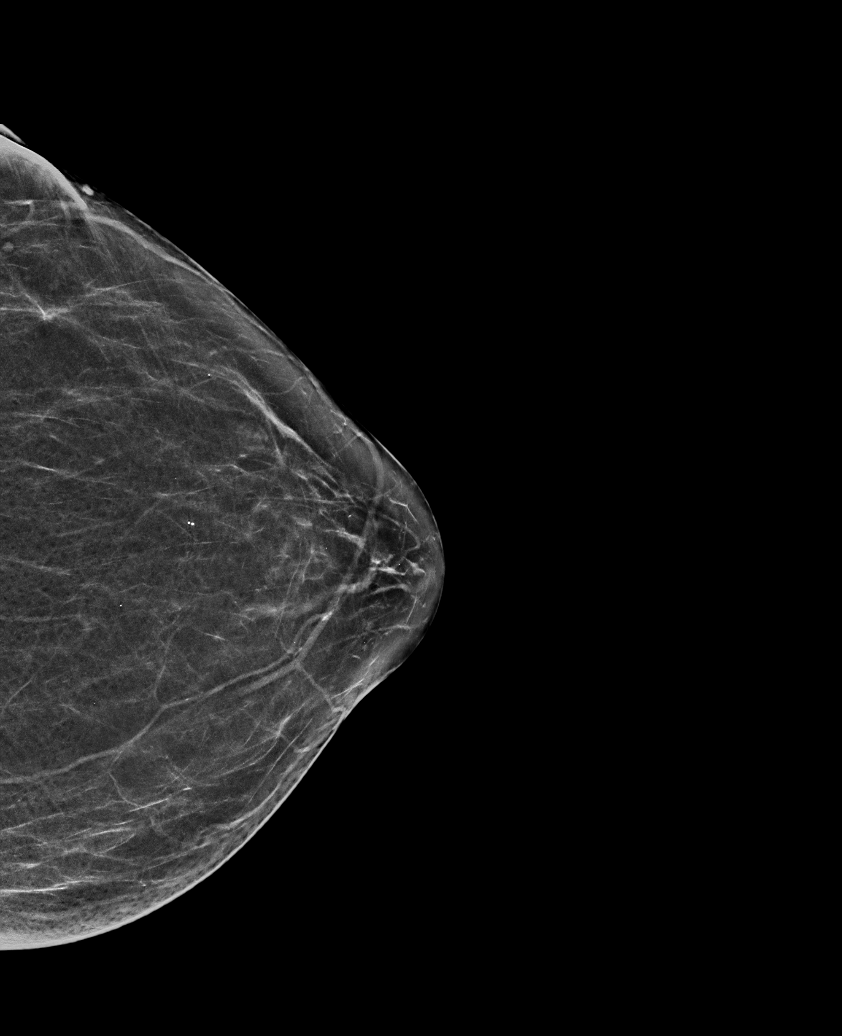

[L MLO synth-2D]
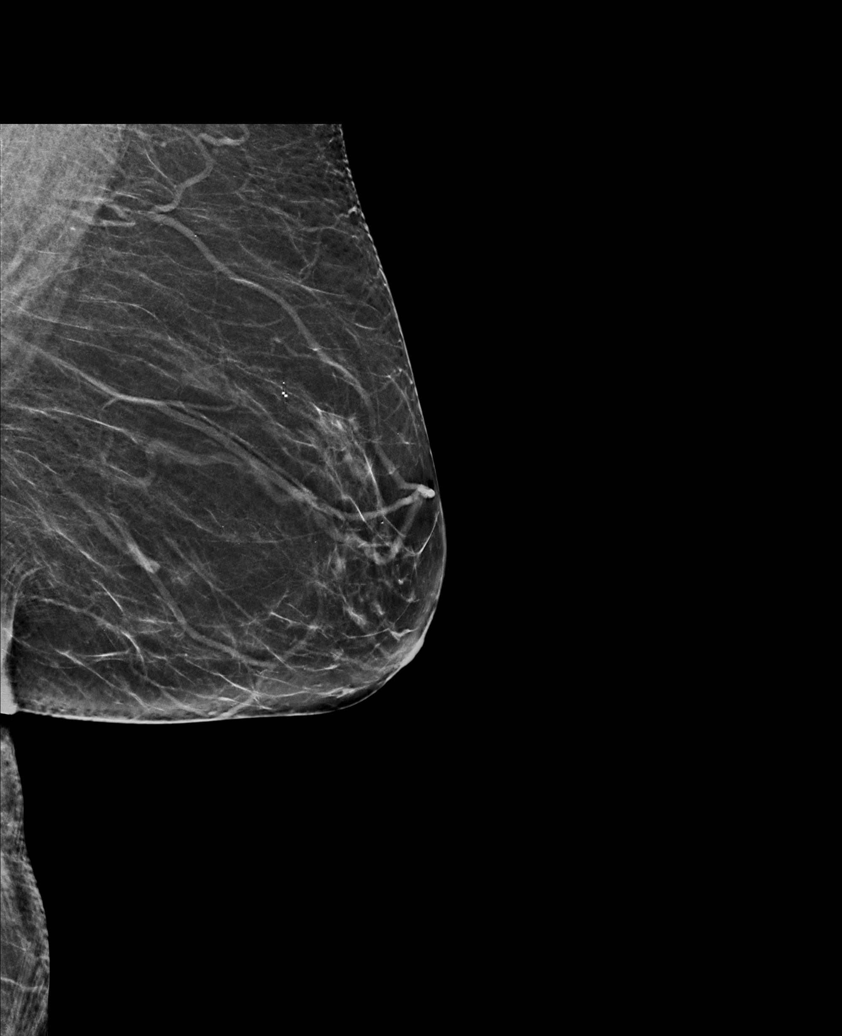

[L MLO tomo · tomo slice 31/61.0]
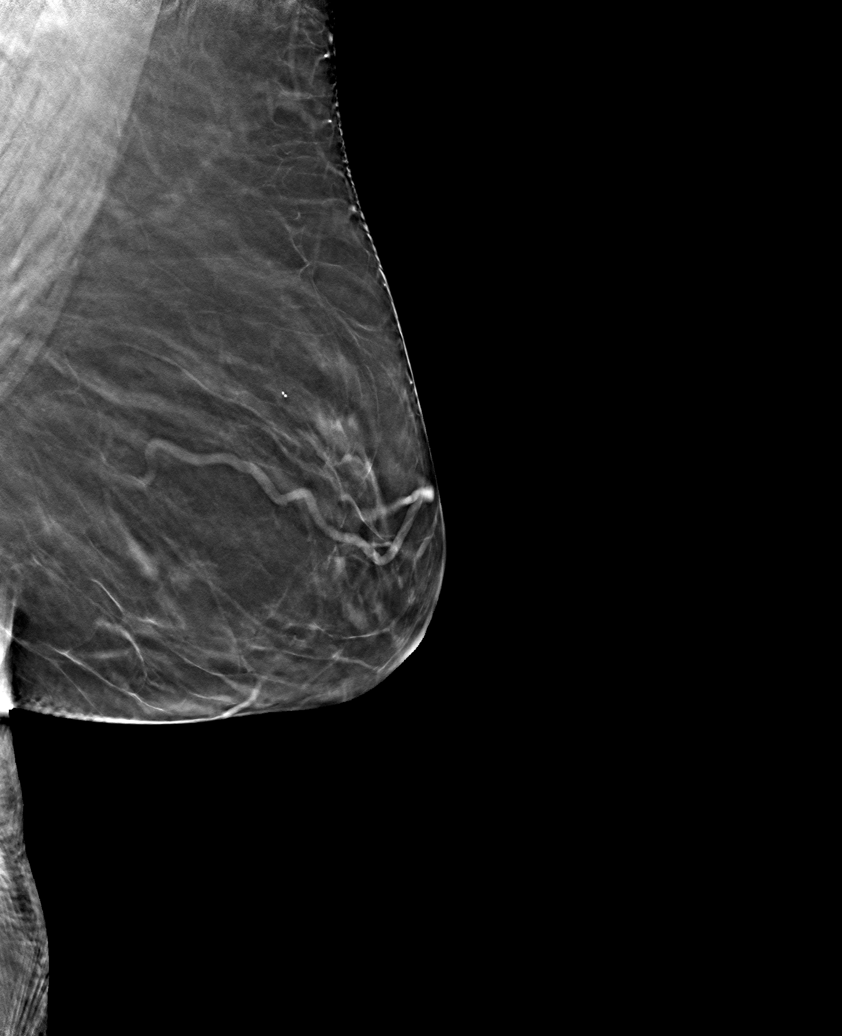

[6 of 30 positions shown; findings below may reference images not displayed]

FINDINGS: There are no findings suspicious for malignancy.
IMPRESSION: No mammographic evidence of malignancy. A result letter of this
screening mammogram will be mailed directly to the patient.

RECOMMENDATION:
Screening mammogram in one year. (Code:[8U])

BI-RADS CATEGORY  1: Negative.

## 2022-03-28 NOTE — Telephone Encounter (Signed)
Patient states [facility] needs sleep study. Also needs pressure range for CPAP machine. Patient phone number is [phone]. ?

## 2022-03-28 NOTE — Telephone Encounter (Signed)
Called [employer] and spoke with Janett Billow to get fax number that we needed to send sleep study to and have printed the scan to be faxed. ? ?Attempted to call pt but unable to reach. Left detailed message for pt letting her know that we are going to send her sleep study results to [facility]. Nothing further needed. ?

## 2022-03-29 ENCOUNTER — Encounter: Payer: Self-pay | Admitting: Physical Therapy

## 2022-03-29 ENCOUNTER — Ambulatory Visit: Payer: Medicare Other | Admitting: Physical Therapy

## 2022-03-29 DIAGNOSIS — R293 Abnormal posture: Secondary | ICD-10-CM

## 2022-03-29 DIAGNOSIS — G8929 Other chronic pain: Secondary | ICD-10-CM

## 2022-03-29 DIAGNOSIS — M5441 Lumbago with sciatica, right side: Secondary | ICD-10-CM | POA: Diagnosis not present

## 2022-03-29 NOTE — Therapy (Signed)
?[facility] ?[place] ?[place], [place], [address] ?Phone: [phone]   Fax:  [phone] ? ?Physical Therapy Treatment ? ?Patient Details  ?Name: [name] [name] ?MRN: [id number] ?Date of Birth: 09/30/1941 ?Referring Provider (PT): Marton Redwood MD. ? ? ?Encounter Date: 03/29/2022 ? ? PT End of Session - 03/29/22 1122   ? ? Visit Number 4   ? Number of Visits 12   ? Date for PT Re-Evaluation 04/25/22   ? Authorization Type FOTO AT LEAST EVERY 5TH VISIT.  PROGRESS NOTE AT 10TH VISIT.  KX MODIFIER AFTER 15 VISITS.   ? PT Start Time 1117   ? PT Stop Time 1208   ? PT Time Calculation (min) 51 min   ? Activity Tolerance Patient tolerated treatment well   ? Behavior During Therapy [facility] for tasks assessed/performed   ? ?  ?  ? ?  ? ? ?Past Medical History:  ?Diagnosis Date  ? Aortic valve stenosis, mild 02/2016  ? Echo February 2017: possible bicuspid w/ moderate thickened and calicified, AVA ~0.86 cm? -no symptoms; June 2022: Hyperdynamic EF 75%.  Mild to moderate AS-mean gradient 21 mmHg  ? COPD (chronic obstructive pulmonary disease) ([place])   ? GERD (gastroesophageal reflux disease)   ? no problems since started on CPAP  ? Hematuria   ? History of adenomatous polyp of colon   ? tubular adnenoma's and hyperplastic polyp's 05-19-2002;  08-15-2010;  2015  ? History of kidney stones   ? multiple since 1970's  ? History of squamous cell carcinoma excision   ? 05-14-2012  right arm;  04-18-2015  left ankle  ? HTN (hypertension)   ? Hyperlipidemia   ? Iron deficiency anemia   ? Left ureteral stone   ? OSA on CPAP   ? per study 10-13-2006 mild to moderate osa w/ hypersomnia  ? Seasonal and perennial allergic rhinitis   ? Type 2 diabetes mellitus ([place])   ? type 2  ? Urgency of urination   ? ? ?Past Surgical History:  ?Procedure Laterality Date  ? BIOPSY  03/29/2020  ? Procedure: BIOPSY;  Surgeon: Clarene Essex, MD;  [facility]: [facility];  Service: Endoscopy;;  ? CARPAL TUNNEL  RELEASE Bilateral 2003  ? COLONOSCOPY  last one 2015  ? CT CHEST WITH CONTRAST   ([place] HX)    ? Advanced aortic and branch vessel atherosclerosis. Tortuous thoracic aorta. Normal heart size, without pericardial effusion. Multivessel coronary artery atherosclerosis  ? CYSTOSCOPY WITH RETROGRADE PYELOGRAM, URETEROSCOPY AND STENT PLACEMENT Left 04/15/2017  ? Procedure: CYSTOSCOPY WITH LEFT  RETROGRADE PYELOGRAM, URETEROSCOPYwith stone basketry;  Surgeon: Kathie Rhodes, MD;  Location: [facility];  Service: Urology;  Laterality: Left;  ? D & C HYSTEROSCOPY W/ RESECTION FIBROID AND POLYP  07/30/2000  ? DOBUTAMINE STRESS ECHO  01-30-2008   [facility]  ? no ischemia/  normal wall motion/  post stress ef 79%  ? ESOPHAGOGASTRODUODENOSCOPY (EGD) WITH PROPOFOL N/A 01/28/2018  ? Procedure: ESOPHAGOGASTRODUODENOSCOPY (EGD) WITH PROPOFOL;  Surgeon: Clarene Essex, MD;  [facility]: [facility];  Service: Endoscopy;  Laterality: N/A;  ? ESOPHAGOGASTRODUODENOSCOPY (EGD) WITH PROPOFOL N/A 08/19/2019  ? Procedure: ESOPHAGOGASTRODUODENOSCOPY (EGD) WITH PROPOFOL;  Surgeon: Clarene Essex, MD;  [facility];  Service: Endoscopy;  Laterality: N/A;  ? ESOPHAGOGASTRODUODENOSCOPY (EGD) WITH PROPOFOL N/A 03/29/2020  ? Procedure: ESOPHAGOGASTRODUODENOSCOPY (EGD) WITH PROPOFOL;  Surgeon: Clarene Essex, MD;  [facility]: [facility];  Service: Endoscopy;  Laterality: N/A;  ? [place]  ? trauma   ?  HEMOSTASIS CLIP PLACEMENT  08/19/2019  ? Procedure: HEMOSTASIS CLIP PLACEMENT;  Surgeon: Clarene Essex, MD;  [facility];  Service: Endoscopy;;  ? HOT HEMOSTASIS N/A 01/28/2018  ? Procedure: HOT HEMOSTASIS (ARGON PLASMA COAGULATION/BICAP);  Surgeon: Clarene Essex, MD;  Location: [facility];  Service: Endoscopy;  Laterality: N/A;  ? LAPAROSCOPIC CHOLECYSTECTOMY  08/20/2001  ? w/ ERCP spincterotomy w/ balloon  ? NM MYOVIEW LTD  7/'19; 6/'21  ? LOW RISK.  EF > 65%. No Ischemia or Infarction. Normal Study: Hyperdynamic.   ? POLYPECTOMY  08/19/2019  ? Procedure: POLYPECTOMY;  Surgeon: Clarene Essex, MD;  [facility];  Service: Endoscopy;;  ? TRANSTHORACIC ECHOCARDIOGRAM  02/13/2016  ? LVEF 55-60%. Gr 1 DD. ? possible bicuspid AoV w/ moderate thickened / calcified AoV w/ mild Stenosis (Mean Gradietn 15 mmHg, Valve area 1.34cm^2), no regurg.    ? TRANSTHORACIC ECHOCARDIOGRAM  06/2018  ? a) EF 55-65%. Gr 1 DD. Mild AS (? Functionally bicuspid AoV w/ mod leaflet thickening) - Mean Grad 15 mmHg, Peak 32 mmHg; b) EF 65 to 70% with dynamic mid cavity gradient due to hyperdynamic LV (peak 28 mmHg-at rest).  Moderate LVH.  Mod AS, Mean grad 27 mmHg  ? TRANSTHORACIC ECHOCARDIOGRAM  06/26/2021  ? EF> 75%.  Hyperdynamic function.  Moderate LVH.  Indeterminate filling pressures.  Normal RV.  Moderate aortic valve calcification.  Mean gradient 21 mmHg.  (Mild to Moderate AS)-previous mean gradient was 27 mmHg.  ? TUBAL LIGATION Bilateral yrs ago  ? ? ?There were no vitals filed for this visit. ? ? Subjective Assessment - 03/29/22 1121   ? ? Subjective Has had a good day but had more pain yesterday after going to a few appointments.   ? Pertinent History CTR, DM, COPD, GERD, HTN.   ? How long can you sit comfortably? No problem.   ? How long can you stand comfortably? Varies.   ? How long can you walk comfortably? Short community distances.  Recommend she use a cane.   ? Patient Stated Goals Get out of pain and be able to do more.   ? Currently in Pain? Yes   ? Pain Score 5    ? Pain Location Back   ? Pain Orientation Lower   ? Pain Descriptors / Indicators Discomfort   ? Pain Type Chronic pain   ? Pain Onset More than a month ago   ? Pain Frequency Constant   ? ?  ?  ? ?  ? ? ? ? ? [facility] PT Assessment - 03/29/22 0001   ? ?  ? Assessment  ? Medical Diagnosis Low back pain.   ? Referring Provider (PT) Marton Redwood MD.   ? Next MD Visit 08/2022   ?  ? Precautions  ? Precaution Comments I recommend patient use cane for additonal safety.   ? ?   ?  ? ?  ? ? ? ? ? ? ? ? ? ? ? ? ? ? ? ? [place] Adult PT Treatment/Exercise - 03/29/22 0001   ? ?  ? Lumbar Exercises: Aerobic  ? Nustep L3 x10 min   ?  ? Modalities  ? Modalities Electrical Stimulation;Traction   ?  ? Electrical Stimulation  ? Electrical Stimulation Location B low back   ? Electrical Stimulation Action IFC   ? Electrical Stimulation Parameters 80-150 hz x10 min   ? Electrical Stimulation Goals Tone;Pain   ?  ? Traction  ? Type of Traction Lumbar   ?  Min (lbs) 15   ? Max (lbs) 80   ? Hold Time 99   ? Rest Time 5   ? Time 15   ? ?  ?  ? ?  ? ? ? ? ? ? ? ? ? ? ? ? ? ? ? PT Long Term Goals - 03/21/22 1149   ? ?  ? PT LONG TERM GOAL #1  ? Title Independent with an HEP.   ? Time 6   ? Period Weeks   ? Status On-going   ?  ? PT LONG TERM GOAL #2  ? Title Eliminate right thigh numbness.   ? Time 6   ? Period Weeks   ? Status On-going   ?  ? PT LONG TERM GOAL #3  ? Title Stand in erect posture.   ? Time 6   ? Period Weeks   ? Status On-going   ?  ? PT LONG TERM GOAL #4  ? Title Perform ADL's with pain not > 3-4/10.   ? Time 6   ? Period Weeks   ? Status On-going   ? ?  ?  ? ?  ? ? ? ? ? ? ? ? Plan - 03/29/22 1211   ? ? Clinical Impression Statement Patient reporting less pain but after a lot of activity pain increases. Patient presented with only 5/10 LBP today. Normal modalities response noted for pain control. Max traction increased to 80# with no complaints.   ? Personal Factors and Comorbidities Comorbidity 1;Other   ? Examination-Activity Limitations Bed Mobility;Transfers;Locomotion Level;Other   ? Examination-Participation Restrictions Other;Meal Prep   ? Stability/Clinical Decision Making Evolving/Moderate complexity   ? Rehab Potential Good   ? PT Frequency 2x / week   ? PT Duration 6 weeks   ? PT Treatment/Interventions ADLs/Self Care Home Management;Cryotherapy;Electrical Stimulation;Ultrasound;Traction;Moist Heat;Functional mobility training;Therapeutic activities;Therapeutic exercise;Manual  techniques;Patient/family education;Passive range of motion;Dry needling   ? PT Next Visit Plan Combo e'stim/US; STW/M, core exercise progression.  Consider intermittment traction beginning at 60#.   ? Consulted

## 2022-04-03 ENCOUNTER — Encounter: Payer: Self-pay | Admitting: Physical Therapy

## 2022-04-03 ENCOUNTER — Ambulatory Visit: Payer: Medicare Other | Attending: Internal Medicine | Admitting: Physical Therapy

## 2022-04-03 DIAGNOSIS — G8929 Other chronic pain: Secondary | ICD-10-CM | POA: Insufficient documentation

## 2022-04-03 DIAGNOSIS — R293 Abnormal posture: Secondary | ICD-10-CM | POA: Insufficient documentation

## 2022-04-03 DIAGNOSIS — M5441 Lumbago with sciatica, right side: Secondary | ICD-10-CM | POA: Diagnosis not present

## 2022-04-03 NOTE — Therapy (Signed)
[place] [facility] ?[address] ?[place], [place], [address] ?Phone: [phone]   Fax:  [phone] ? ?Physical Therapy Treatment ? ?Patient Details  ?Name: [name] [name] ?MRN: [id number] ?Date of Birth: July 10, 1941 ?Referring Provider (PT): Marton Redwood MD. ? ? ?Encounter Date: 04/03/2022 ? ? PT End of Session - 04/03/22 1151   ? ? Visit Number 5   ? Number of Visits 12   ? Date for PT Re-Evaluation 04/25/22   ? Authorization Type FOTO AT LEAST EVERY 5TH VISIT.  PROGRESS NOTE AT 10TH VISIT.  KX MODIFIER AFTER 15 VISITS.   ? PT Start Time 1118   ? PT Stop Time 1207   ? PT Time Calculation (min) 49 min   ? Activity Tolerance Patient tolerated treatment well   ? Behavior During Therapy [facility] for tasks assessed/performed   ? ?  ?  ? ?  ? ? ?Past Medical History:  ?Diagnosis Date  ? Aortic valve stenosis, mild 02/2016  ? Echo February 2017: possible bicuspid w/ moderate thickened and calicified, AVA ~5.39 cm? -no symptoms; June 2022: Hyperdynamic EF 75%.  Mild to moderate AS-mean gradient 21 mmHg  ? COPD (chronic obstructive pulmonary disease) ([place])   ? GERD (gastroesophageal reflux disease)   ? no problems since started on CPAP  ? Hematuria   ? History of adenomatous polyp of colon   ? tubular adnenoma's and hyperplastic polyp's 05-19-2002;  08-15-2010;  2015  ? History of kidney stones   ? multiple since 1970's  ? History of squamous cell carcinoma excision   ? 05-14-2012  right arm;  04-18-2015  left ankle  ? HTN (hypertension)   ? Hyperlipidemia   ? Iron deficiency anemia   ? Left ureteral stone   ? OSA on CPAP   ? per study 10-13-2006 mild to moderate osa w/ hypersomnia  ? Seasonal and perennial allergic rhinitis   ? Type 2 diabetes mellitus ([place])   ? type 2  ? Urgency of urination   ? ? ?Past Surgical History:  ?Procedure Laterality Date  ? BIOPSY  03/29/2020  ? Procedure: BIOPSY;  Surgeon: Clarene Essex, MD;  Location: [facility];  Service: Endoscopy;;  ? CARPAL TUNNEL RELEASE  Bilateral 2003  ? COLONOSCOPY  last one 2015  ? CT CHEST WITH CONTRAST   ([place] HX)    ? Advanced aortic and branch vessel atherosclerosis. Tortuous thoracic aorta. Normal heart size, without pericardial effusion. Multivessel coronary artery atherosclerosis  ? CYSTOSCOPY WITH RETROGRADE PYELOGRAM, URETEROSCOPY AND STENT PLACEMENT Left 04/15/2017  ? Procedure: CYSTOSCOPY WITH LEFT  RETROGRADE PYELOGRAM, URETEROSCOPYwith stone basketry;  Surgeon: Kathie Rhodes, MD;  Location[facility];  Service: Urology;  Laterality: Left;  ? D & C HYSTEROSCOPY W/ RESECTION FIBROID AND POLYP  07/30/2000  ? DOBUTAMINE STRESS ECHO  01-30-2008   [facility]  ? no ischemia/  normal wall motion/  post stress ef 79%  ? ESOPHAGOGASTRODUODENOSCOPY (EGD) WITH PROPOFOL N/A 01/28/2018  ? Procedure: ESOPHAGOGASTRODUODENOSCOPY (EGD) WITH PROPOFOL;  Surgeon: Clarene Essex, MD;  Location: [facility];  Service: Endoscopy;  Laterality: N/A;  ? ESOPHAGOGASTRODUODENOSCOPY (EGD) WITH PROPOFOL N/A 08/19/2019  ? Procedure: ESOPHAGOGASTRODUODENOSCOPY (EGD) WITH PROPOFOL;  Surgeon: Clarene Essex, MD;  Location: [facility];  Service: Endoscopy;  Laterality: N/A;  ? ESOPHAGOGASTRODUODENOSCOPY (EGD) WITH PROPOFOL N/A 03/29/2020  ? Procedure: ESOPHAGOGASTRODUODENOSCOPY (EGD) WITH PROPOFOL;  Surgeon: Clarene Essex, MD;  Location: [facility];  Service: Endoscopy;  Laterality: N/A;  ? [place]  ? trauma   ?  HEMOSTASIS CLIP PLACEMENT  08/19/2019  ? Procedure: HEMOSTASIS CLIP PLACEMENT;  Surgeon: Clarene Essex, MD;  Location: [facility];  Service: Endoscopy;;  ? HOT HEMOSTASIS N/A 01/28/2018  ? Procedure: HOT HEMOSTASIS (ARGON PLASMA COAGULATION/BICAP);  Surgeon: Clarene Essex, MD;  Location: [facility];  Service: Endoscopy;  Laterality: N/A;  ? LAPAROSCOPIC CHOLECYSTECTOMY  08/20/2001  ? w/ ERCP spincterotomy w/ balloon  ? NM MYOVIEW LTD  7/'19; 6/'21  ? LOW RISK.  EF > 65%. No Ischemia or Infarction. Normal Study: Hyperdynamic.  ?  POLYPECTOMY  08/19/2019  ? Procedure: POLYPECTOMY;  Surgeon: Clarene Essex, MD;  Location: [facility];  Service: Endoscopy;;  ? TRANSTHORACIC ECHOCARDIOGRAM  02/13/2016  ? LVEF 55-60%. Gr 1 DD. ? possible bicuspid AoV w/ moderate thickened / calcified AoV w/ mild Stenosis (Mean Gradietn 15 mmHg, Valve area 1.34cm^2), no regurg.    ? TRANSTHORACIC ECHOCARDIOGRAM  06/2018  ? a) EF 55-65%. Gr 1 DD. Mild AS (? Functionally bicuspid AoV w/ mod leaflet thickening) - Mean Grad 15 mmHg, Peak 32 mmHg; b) EF 65 to 70% with dynamic mid cavity gradient due to hyperdynamic LV (peak 28 mmHg-at rest).  Moderate LVH.  Mod AS, Mean grad 27 mmHg  ? TRANSTHORACIC ECHOCARDIOGRAM  06/26/2021  ? EF> 75%.  Hyperdynamic function.  Moderate LVH.  Indeterminate filling pressures.  Normal RV.  Moderate aortic valve calcification.  Mean gradient 21 mmHg.  (Mild to Moderate AS)-previous mean gradient was 27 mmHg.  ? TUBAL LIGATION Bilateral yrs ago  ? ? ?There were no vitals filed for this visit. ? ? Subjective Assessment - 04/03/22 1120   ? ? Subjective Reports she feels better today and has been using TENS unit at home.   ? Pertinent History CTR, DM, COPD, GERD, HTN.   ? How long can you sit comfortably? No problem.   ? How long can you stand comfortably? Varies.   ? How long can you walk comfortably? Short community distances.  Recommend she use a cane.   ? Patient Stated Goals Get out of pain and be able to do more.   ? Currently in Pain? Yes   ? Pain Score 3    ? Pain Location Back   ? Pain Orientation Lower   ? Pain Descriptors / Indicators Discomfort   ? Pain Type Chronic pain   ? Pain Onset More than a month ago   ? Pain Frequency Constant   ? ?  ?  ? ?  ? ? ? ? ? [facility] PT Assessment - 04/03/22 0001   ? ?  ? Assessment  ? Medical Diagnosis Low back pain.   ? Referring Provider (PT) Marton Redwood MD.   ? Next MD Visit 08/2022   ?  ? Precautions  ? Precaution Comments I recommend patient use cane for additonal safety.   ? ?  ?  ? ?   ? ? ? ? ? ? ? ? ? ? ? ? ? ? ? ? [facility] Adult PT Treatment/Exercise - 04/03/22 0001   ? ?  ? Lumbar Exercises: Aerobic  ? Nustep L3 x13 min   ?  ? Lumbar Exercises: Standing  ? Other Standing Lumbar Exercises B hip abduction, extension 5x5 reps each   ?  ? Lumbar Exercises: Seated  ? Long [employer] on Chair AROM;Both;15 reps   ? Hip Flexion on Ball AROM;Both;15 reps   ? Sit to Stand 15 reps   ?  ? Modalities  ? Modalities Traction   ?  ?  Traction  ? Type of Traction Lumbar   ? Min (lbs) 15   ? Max (lbs) 80   ? Hold Time 99   ? Rest Time 5   ? Time 15   ? ?  ?  ? ?  ? ? ? ? ? ? ? ? ? ? ? ? ? ? ? PT Long Term Goals - 03/21/22 1149   ? ?  ? PT LONG TERM GOAL #1  ? Title Independent with an HEP.   ? Time 6   ? Period Weeks   ? Status On-going   ?  ? PT LONG TERM GOAL #2  ? Title Eliminate right thigh numbness.   ? Time 6   ? Period Weeks   ? Status On-going   ?  ? PT LONG TERM GOAL #3  ? Title Stand in erect posture.   ? Time 6   ? Period Weeks   ? Status On-going   ?  ? PT LONG TERM GOAL #4  ? Title Perform ADL's with pain not > 3-4/10.   ? Time 6   ? Period Weeks   ? Status On-going   ? ?  ?  ? ?  ? ? ? ? ? ? ? ? Plan - 04/03/22 1153   ? ? Clinical Impression Statement Patient presented in clinic with less overall LBP but states that pain fluctuates at times. Patient progressd through more standing and functional strengthening with posture focus throughout. Mechanical lumbar traction continued at 80#. Using TENS unit at home which she states has helped.   ? Personal Factors and Comorbidities Comorbidity 1;Other   ? Comorbidities CTR, DM, COPD, GERD, HTN.   ? Examination-Activity Limitations Bed Mobility;Transfers;Locomotion Level;Other   ? Examination-Participation Restrictions Other;Meal Prep   ? Stability/Clinical Decision Making Evolving/Moderate complexity   ? Rehab Potential Good   ? PT Frequency 2x / week   ? PT Duration 6 weeks   ? PT Treatment/Interventions ADLs/Self Care Home Management;Cryotherapy;Electrical  Stimulation;Ultrasound;Traction;Moist Heat;Functional mobility training;Therapeutic activities;Therapeutic exercise;Manual techniques;Patient/family education;Passive range of motion;Dry needling   ? PT Next Visit

## 2022-04-04 ENCOUNTER — Telehealth: Payer: Self-pay | Admitting: Internal Medicine

## 2022-04-04 NOTE — Telephone Encounter (Signed)
[facility] should have the order that already has settings. Called pt and there was no answer- LMTCB.  ?

## 2022-04-04 NOTE — Telephone Encounter (Signed)
Patient is returning phone call. Patient phone number is [phone]. ?

## 2022-04-05 ENCOUNTER — Ambulatory Visit: Payer: Medicare Other | Admitting: *Deleted

## 2022-04-05 DIAGNOSIS — G8929 Other chronic pain: Secondary | ICD-10-CM

## 2022-04-05 DIAGNOSIS — M5441 Lumbago with sciatica, right side: Secondary | ICD-10-CM | POA: Diagnosis not present

## 2022-04-05 DIAGNOSIS — R293 Abnormal posture: Secondary | ICD-10-CM | POA: Diagnosis not present

## 2022-04-05 NOTE — Therapy (Signed)
[place] ?[facility] ?[address] ?[place], [place], [address] ?Phone: [phone]   Fax:  [phone] ? ?Physical Therapy Treatment ? ?Patient Details  ?Name: [name] [name] ?MRN: [id number] ?Date of Birth: 08-May-1941 ?Referring Provider (PT): Marton Redwood MD. ? ? ?Encounter Date: 04/05/2022 ? ? PT End of Session - 04/05/22 1132   ? ? Visit Number 6   ? Number of Visits 12   ? Date for PT Re-Evaluation 04/25/22   ? Authorization Type FOTO AT LEAST EVERY 5TH VISIT.  PROGRESS NOTE AT 10TH VISIT.  KX MODIFIER AFTER 15 VISITS.   ? PT Start Time 1117   ? PT Stop Time 1208   ? PT Time Calculation (min) 51 min   ? ?  ?  ? ?  ? ? ?Past Medical History:  ?Diagnosis Date  ? Aortic valve stenosis, mild 02/2016  ? Echo February 2017: possible bicuspid w/ moderate thickened and calicified, AVA ~1.01 cm? -no symptoms; June 2022: Hyperdynamic EF 75%.  Mild to moderate AS-mean gradient 21 mmHg  ? COPD (chronic obstructive pulmonary disease) ([place])   ? GERD (gastroesophageal reflux disease)   ? no problems since started on CPAP  ? Hematuria   ? History of adenomatous polyp of colon   ? tubular adnenoma's and hyperplastic polyp's 05-19-2002;  08-15-2010;  2015  ? History of kidney stones   ? multiple since 1970's  ? History of squamous cell carcinoma excision   ? 05-14-2012  right arm;  04-18-2015  left ankle  ? HTN (hypertension)   ? Hyperlipidemia   ? Iron deficiency anemia   ? Left ureteral stone   ? OSA on CPAP   ? per study 10-13-2006 mild to moderate osa w/ hypersomnia  ? Seasonal and perennial allergic rhinitis   ? Type 2 diabetes mellitus ([place])   ? type 2  ? Urgency of urination   ? ? ?Past Surgical History:  ?Procedure Laterality Date  ? BIOPSY  03/29/2020  ? Procedure: BIOPSY;  Surgeon: Clarene Essex, MD;  [facility]: [facility];  Service: Endoscopy;;  ? CARPAL TUNNEL RELEASE Bilateral 2003  ? COLONOSCOPY  last one 2015  ? CT CHEST WITH CONTRAST   ([place] HX)    ? Advanced aortic and branch  vessel atherosclerosis. Tortuous thoracic aorta. Normal heart size, without pericardial effusion. Multivessel coronary artery atherosclerosis  ? CYSTOSCOPY WITH RETROGRADE PYELOGRAM, URETEROSCOPY AND STENT PLACEMENT Left 04/15/2017  ? Procedure: CYSTOSCOPY WITH LEFT  RETROGRADE PYELOGRAM, URETEROSCOPYwith stone basketry;  Surgeon: Kathie Rhodes, MD;  Location: [facility];  Service: Urology;  Laterality: Left;  ? D & C HYSTEROSCOPY W/ RESECTION FIBROID AND POLYP  07/30/2000  ? DOBUTAMINE STRESS ECHO  01-30-2008   [facility]  ? no ischemia/  normal wall motion/  post stress ef 79%  ? ESOPHAGOGASTRODUODENOSCOPY (EGD) WITH PROPOFOL N/A 01/28/2018  ? Procedure: ESOPHAGOGASTRODUODENOSCOPY (EGD) WITH PROPOFOL;  Surgeon: Clarene Essex, MD;  [facility]: [facility];  Service: Endoscopy;  Laterality: N/A;  ? ESOPHAGOGASTRODUODENOSCOPY (EGD) WITH PROPOFOL N/A 08/19/2019  ? Procedure: ESOPHAGOGASTRODUODENOSCOPY (EGD) WITH PROPOFOL;  Surgeon: Clarene Essex, MD;  [facility]: [facility];  Service: Endoscopy;  Laterality: N/A;  ? ESOPHAGOGASTRODUODENOSCOPY (EGD) WITH PROPOFOL N/A 03/29/2020  ? Procedure: ESOPHAGOGASTRODUODENOSCOPY (EGD) WITH PROPOFOL;  Surgeon: Clarene Essex, MD;  [facility]: [facility];  Service: Endoscopy;  Laterality: N/A;  ? [place]  ? trauma   ? HEMOSTASIS CLIP PLACEMENT  08/19/2019  ? Procedure: HEMOSTASIS CLIP PLACEMENT;  Surgeon: Clarene Essex, MD;  [facility]: [facility];  Service: Endoscopy;;  ? HOT HEMOSTASIS N/A 01/28/2018  ? Procedure: HOT HEMOSTASIS (ARGON PLASMA COAGULATION/BICAP);  Surgeon: Clarene Essex, MD;  Location: [facility];  Service: Endoscopy;  Laterality: N/A;  ? LAPAROSCOPIC CHOLECYSTECTOMY  08/20/2001  ? w/ ERCP spincterotomy w/ balloon  ? NM MYOVIEW LTD  7/'19; 6/'21  ? LOW RISK.  EF > 65%. No Ischemia or Infarction. Normal Study: Hyperdynamic.  ? POLYPECTOMY  08/19/2019  ? Procedure: POLYPECTOMY;  Surgeon: Clarene Essex, MD;  [facility]: [facility];  Service:  Endoscopy;;  ? TRANSTHORACIC ECHOCARDIOGRAM  02/13/2016  ? LVEF 55-60%. Gr 1 DD. ? possible bicuspid AoV w/ moderate thickened / calcified AoV w/ mild Stenosis (Mean Gradietn 15 mmHg, Valve area 1.34cm^2), no regurg.    ? TRANSTHORACIC ECHOCARDIOGRAM  06/2018  ? a) EF 55-65%. Gr 1 DD. Mild AS (? Functionally bicuspid AoV w/ mod leaflet thickening) - Mean Grad 15 mmHg, Peak 32 mmHg; b) EF 65 to 70% with dynamic mid cavity gradient due to hyperdynamic LV (peak 28 mmHg-at rest).  Moderate LVH.  Mod AS, Mean grad 27 mmHg  ? TRANSTHORACIC ECHOCARDIOGRAM  06/26/2021  ? EF> 75%.  Hyperdynamic function.  Moderate LVH.  Indeterminate filling pressures.  Normal RV.  Moderate aortic valve calcification.  Mean gradient 21 mmHg.  (Mild to Moderate AS)-previous mean gradient was 27 mmHg.  ? TUBAL LIGATION Bilateral yrs ago  ? ? ?There were no vitals filed for this visit. ? ? Subjective Assessment - 04/05/22 1130   ? ? Subjective Reports she feels fair today. Pain 7/10. Traction and Estim really helps   ? Pertinent History CTR, DM, COPD, GERD, HTN.   ? How long can you sit comfortably? No problem.   ? How long can you stand comfortably? Varies.   ? How long can you walk comfortably? Short community distances.  Recommend she use a cane.   ? Patient Stated Goals Get out of pain and be able to do more.   ? Currently in Pain? Yes   ? Pain Score 7    ? Pain Location Back   ? Pain Orientation Lower   ? Pain Descriptors / Indicators Discomfort   ? Pain Type Chronic pain   ? Pain Onset More than a month ago   ? ?  ?  ? ?  ? ? ? ? ? ? ? ? ? ? ? ? ? ? ? ? ? ? ? ? [facility] Adult PT Treatment/Exercise - 04/05/22 0001   ? ?  ? Lumbar Exercises: Aerobic  ? Nustep L3 x14 min   ?  ? Modalities  ? Modalities Traction   ?  ? Electrical Stimulation  ? Electrical Stimulation Location B low back   ? Electrical Stimulation Action IFC   ? Electrical Stimulation Parameters 80-'150hz'$  x 15 mins   ? Electrical Stimulation Goals Tone;Pain   ?  ? Traction  ? Type  of Traction Lumbar   ? Min (lbs) 15   ? Max (lbs) 80   ? Hold Time 99   ? Rest Time 5   ? Time 15   ? ?  ?  ? ?  ? ? ? ? ? ? ? ? ? ? ? ? ? ? ? PT Long Term Goals - 03/21/22 1149   ? ?  ? PT LONG TERM GOAL #1  ? Title Independent with an HEP.   ? Time 6   ? Period Weeks   ? Status On-going   ?  ?  PT LONG TERM GOAL #2  ? Title Eliminate right thigh numbness.   ? Time 6   ? Period Weeks   ? Status On-going   ?  ? PT LONG TERM GOAL #3  ? Title Stand in erect posture.   ? Time 6   ? Period Weeks   ? Status On-going   ?  ? PT LONG TERM GOAL #4  ? Title Perform ADL's with pain not > 3-4/10.   ? Time 6   ? Period Weeks   ? Status On-going   ? ?  ?  ? ?  ? ? ? ? ? ? ? ? Plan - 04/05/22 1308   ? ? Clinical Impression Statement Pt arrived today with reports doing better overall with PT. She did well with [place] as well as Traction at 80#s again and did well. IFC end of session.   ? Personal Factors and Comorbidities Comorbidity 1;Other   ? Comorbidities CTR, DM, COPD, GERD, HTN.   ? Examination-Activity Limitations Bed Mobility;Transfers;Locomotion Level;Other   ? Examination-Participation Restrictions Other;Meal Prep   ? Stability/Clinical Decision Making Evolving/Moderate complexity   ? Rehab Potential Good   ? PT Frequency 2x / week   ? PT Duration 6 weeks   ? PT Treatment/Interventions ADLs/Self Care Home Management;Cryotherapy;Electrical Stimulation;Ultrasound;Traction;Moist Heat;Functional mobility training;Therapeutic activities;Therapeutic exercise;Manual techniques;Patient/family education;Passive range of motion;Dry needling   ? PT Next Visit Plan Continue functional training and strengthening with traction.   ? Consulted and Agree with Plan of Care Patient   ? ?  ?  ? ?  ? ? ?Patient will benefit from skilled therapeutic intervention in order to improve the following deficits and impairments:  Abnormal gait, Increased muscle spasms, Decreased activity tolerance, Decreased range of motion, Postural dysfunction,  Pain ? ?Visit Diagnosis: ?Chronic right-sided low back pain with right-sided sciatica ? ?Abnormal posture ? ? ? ? ?Problem List ?Patient Active Problem List  ? Diagnosis Date Noted  ? Multiple lung nodules on CT

## 2022-04-10 ENCOUNTER — Ambulatory Visit: Payer: Medicare Other | Admitting: Physical Therapy

## 2022-04-10 DIAGNOSIS — M5441 Lumbago with sciatica, right side: Secondary | ICD-10-CM | POA: Diagnosis not present

## 2022-04-10 DIAGNOSIS — G8929 Other chronic pain: Secondary | ICD-10-CM | POA: Diagnosis not present

## 2022-04-10 DIAGNOSIS — R293 Abnormal posture: Secondary | ICD-10-CM | POA: Diagnosis not present

## 2022-04-10 DIAGNOSIS — Z20822 Contact with and (suspected) exposure to covid-19: Secondary | ICD-10-CM | POA: Diagnosis not present

## 2022-04-10 NOTE — Therapy (Signed)
[place] ?[facility] ?[address] ?[place], [place], [address] ?Phone: [phone]   Fax:  [phone] ? ?Physical Therapy Treatment ? ?Patient Details  ?Name: [name] [name] ?MRN: [id number] ?Date of Birth: 04-06-1941 ?Referring Provider (PT): Marton Redwood MD. ? ? ?Encounter Date: 04/10/2022 ? ? PT End of Session - 04/10/22 1138   ? ? Visit Number 7   ? Number of Visits 12   ? Authorization Type FOTO AT LEAST EVERY 5TH VISIT.  PROGRESS NOTE AT 10TH VISIT.  KX MODIFIER AFTER 15 VISITS.   ? PT Start Time 1115   ? PT Stop Time 1210   ? PT Time Calculation (min) 55 min   ? Activity Tolerance Patient tolerated treatment well   ? Behavior During Therapy [facility] for tasks assessed/performed   ? ?  ?  ? ?  ? ? ?Past Medical History:  ?Diagnosis Date  ? Aortic valve stenosis, mild 02/2016  ? Echo February 2017: possible bicuspid w/ moderate thickened and calicified, AVA ~3.22 cm? -no symptoms; June 2022: Hyperdynamic EF 75%.  Mild to moderate AS-mean gradient 21 mmHg  ? COPD (chronic obstructive pulmonary disease) ([place])   ? GERD (gastroesophageal reflux disease)   ? no problems since started on CPAP  ? Hematuria   ? History of adenomatous polyp of colon   ? tubular adnenoma's and hyperplastic polyp's 05-19-2002;  08-15-2010;  2015  ? History of kidney stones   ? multiple since 1970's  ? History of squamous cell carcinoma excision   ? 05-14-2012  right arm;  04-18-2015  left ankle  ? HTN (hypertension)   ? Hyperlipidemia   ? Iron deficiency anemia   ? Left ureteral stone   ? OSA on CPAP   ? per study 10-13-2006 mild to moderate osa w/ hypersomnia  ? Seasonal and perennial allergic rhinitis   ? Type 2 diabetes mellitus ([place])   ? type 2  ? Urgency of urination   ? ? ?Past Surgical History:  ?Procedure Laterality Date  ? BIOPSY  03/29/2020  ? Procedure: BIOPSY;  Surgeon: Clarene Essex, MD;  Location: [facility];  Service: Endoscopy;;  ? CARPAL TUNNEL RELEASE Bilateral 2003  ? COLONOSCOPY  last  one 2015  ? CT CHEST WITH CONTRAST   ([place] HX)    ? Advanced aortic and branch vessel atherosclerosis. Tortuous thoracic aorta. Normal heart size, without pericardial effusion. Multivessel coronary artery atherosclerosis  ? CYSTOSCOPY WITH RETROGRADE PYELOGRAM, URETEROSCOPY AND STENT PLACEMENT Left 04/15/2017  ? Procedure: CYSTOSCOPY WITH LEFT  RETROGRADE PYELOGRAM, URETEROSCOPYwith stone basketry;  Surgeon: Kathie Rhodes, MD;  Location: [facility];  Service: Urology;  Laterality: Left;  ? D & C HYSTEROSCOPY W/ RESECTION FIBROID AND POLYP  07/30/2000  ? DOBUTAMINE STRESS ECHO  01-30-2008   [facility]  ? no ischemia/  normal wall motion/  post stress ef 79%  ? ESOPHAGOGASTRODUODENOSCOPY (EGD) WITH PROPOFOL N/A 01/28/2018  ? Procedure: ESOPHAGOGASTRODUODENOSCOPY (EGD) WITH PROPOFOL;  Surgeon: Clarene Essex, MD;  Location: [facility];  Service: Endoscopy;  Laterality: N/A;  ? ESOPHAGOGASTRODUODENOSCOPY (EGD) WITH PROPOFOL N/A 08/19/2019  ? Procedure: ESOPHAGOGASTRODUODENOSCOPY (EGD) WITH PROPOFOL;  Surgeon: Clarene Essex, MD;  [facility]: [facility];  Service: Endoscopy;  Laterality: N/A;  ? ESOPHAGOGASTRODUODENOSCOPY (EGD) WITH PROPOFOL N/A 03/29/2020  ? Procedure: ESOPHAGOGASTRODUODENOSCOPY (EGD) WITH PROPOFOL;  Surgeon: Clarene Essex, MD;  Location: [facility];  Service: Endoscopy;  Laterality: N/A;  ? [place]  ? trauma   ? HEMOSTASIS CLIP PLACEMENT  08/19/2019  ?  Procedure: HEMOSTASIS CLIP PLACEMENT;  Surgeon: Clarene Essex, MD;  Location: [facility];  Service: Endoscopy;;  ? HOT HEMOSTASIS N/A 01/28/2018  ? Procedure: HOT HEMOSTASIS (ARGON PLASMA COAGULATION/BICAP);  Surgeon: Clarene Essex, MD;  Location: [facility];  Service: Endoscopy;  Laterality: N/A;  ? LAPAROSCOPIC CHOLECYSTECTOMY  08/20/2001  ? w/ ERCP spincterotomy w/ balloon  ? NM MYOVIEW LTD  7/'19; 6/'21  ? LOW RISK.  EF > 65%. No Ischemia or Infarction. Normal Study: Hyperdynamic.  ? POLYPECTOMY  08/19/2019  ? Procedure:  POLYPECTOMY;  Surgeon: Clarene Essex, MD;  [facility]: [facility];  Service: Endoscopy;;  ? TRANSTHORACIC ECHOCARDIOGRAM  02/13/2016  ? LVEF 55-60%. Gr 1 DD. ? possible bicuspid AoV w/ moderate thickened / calcified AoV w/ mild Stenosis (Mean Gradietn 15 mmHg, Valve area 1.34cm^2), no regurg.    ? TRANSTHORACIC ECHOCARDIOGRAM  06/2018  ? a) EF 55-65%. Gr 1 DD. Mild AS (? Functionally bicuspid AoV w/ mod leaflet thickening) - Mean Grad 15 mmHg, Peak 32 mmHg; b) EF 65 to 70% with dynamic mid cavity gradient due to hyperdynamic LV (peak 28 mmHg-at rest).  Moderate LVH.  Mod AS, Mean grad 27 mmHg  ? TRANSTHORACIC ECHOCARDIOGRAM  06/26/2021  ? EF> 75%.  Hyperdynamic function.  Moderate LVH.  Indeterminate filling pressures.  Normal RV.  Moderate aortic valve calcification.  Mean gradient 21 mmHg.  (Mild to Moderate AS)-previous mean gradient was 27 mmHg.  ? TUBAL LIGATION Bilateral yrs ago  ? ? ?There were no vitals filed for this visit. ? ? Subjective Assessment - 04/10/22 1138   ? ? Subjective Treatment are helping.  Patient reporting feeling about 30% better.   ? Pertinent History CTR, DM, COPD, GERD, HTN.   ? How long can you sit comfortably? No problem.   ? How long can you stand comfortably? Varies.   ? How long can you walk comfortably? Short community distances.  Recommend she use a cane.   ? Patient Stated Goals Get out of pain and be able to do more.   ? Currently in Pain? Yes   ? Pain Score 5    ? Pain Location Back   ? Pain Orientation Lower   ? Pain Descriptors / Indicators Discomfort   ? Pain Onset More than a month ago   ? ?  ?  ? ?  ? ? ? ? ? ? ? ? ? ? ? ? ? ? ? ? ? ? ? ? [place] Adult PT Treatment/Exercise - 04/10/22 0001   ? ?  ? Exercises  ? Exercises Knee/Hip   ?  ? Lumbar Exercises: Aerobic  ? Nustep Level 4 x 10 minutes.   ?  ? Electrical Stimulation  ? Electrical Stimulation Location Bilateral low back   ? Electrical Stimulation Action IFC at 80-150 Hz.   ? Electrical Stimulation Parameters 40% scan x  15 minutes.   ? Electrical Stimulation Goals Pain;Tone   ?  ? Traction  ? Type of Traction Lumbar   ? Min (lbs) 15   ? Max (lbs) 83   ? Hold Time 99   ? Rest Time 5   ? Time 15   ? ?  ?  ? ?  ? ? ? ? ? ? ? ? ? ? ? ? ? ? ? PT Long Term Goals - 03/21/22 1149   ? ?  ? PT LONG TERM GOAL #1  ? Title Independent with an HEP.   ? Time 6   ? Period  Weeks   ? Status On-going   ?  ? PT LONG TERM GOAL #2  ? Title Eliminate right thigh numbness.   ? Time 6   ? Period Weeks   ? Status On-going   ?  ? PT LONG TERM GOAL #3  ? Title Stand in erect posture.   ? Time 6   ? Period Weeks   ? Status On-going   ?  ? PT LONG TERM GOAL #4  ? Title Perform ADL's with pain not > 3-4/10.   ? Time 6   ? Period Weeks   ? Status On-going   ? ?  ?  ? ?  ? ? ? ? ? ? ? ? Plan - 04/10/22 1200   ? ? Clinical Impression Statement Patient pleased with her progress reporting she feels about 30% better.  Patient did great with a 3# increase in traction today andfelt better following.   ? Personal Factors and Comorbidities Comorbidity 1;Other   ? Comorbidities CTR, DM, COPD, GERD, HTN.   ? Examination-Activity Limitations Bed Mobility;Transfers;Locomotion Level;Other   ? Examination-Participation Restrictions Other;Meal Prep   ? Stability/Clinical Decision Making Evolving/Moderate complexity   ? Rehab Potential Good   ? PT Frequency 2x / week   ? PT Duration 6 weeks   ? PT Treatment/Interventions ADLs/Self Care Home Management;Cryotherapy;Electrical Stimulation;Ultrasound;Traction;Moist Heat;Functional mobility training;Therapeutic activities;Therapeutic exercise;Manual techniques;Patient/family education;Passive range of motion;Dry needling   ? PT Next Visit Plan Continue functional training and strengthening with traction.   ? Consulted and Agree with Plan of Care Patient   ? ?  ?  ? ?  ? ? ?Patient will benefit from skilled therapeutic intervention in order to improve the following deficits and impairments:  Abnormal gait, Increased muscle spasms,  Decreased activity tolerance, Decreased range of motion, Postural dysfunction, Pain ? ?Visit Diagnosis: ?Chronic right-sided low back pain with right-sided sciatica ? ?Abnormal posture ? ? ? ? ?Problem List ?

## 2022-04-10 NOTE — Telephone Encounter (Signed)
Attempted to call pt but line went directly to VM. Left message for her to return call. Due to multiple attempts trying to reach pt and unable to do so, per protocol encounter will be closed. ?

## 2022-04-11 NOTE — Telephone Encounter (Signed)
See message on 04/04/22.  ?

## 2022-04-12 ENCOUNTER — Ambulatory Visit: Payer: Medicare Other

## 2022-04-12 DIAGNOSIS — R293 Abnormal posture: Secondary | ICD-10-CM

## 2022-04-12 DIAGNOSIS — G8929 Other chronic pain: Secondary | ICD-10-CM

## 2022-04-12 DIAGNOSIS — M5441 Lumbago with sciatica, right side: Secondary | ICD-10-CM | POA: Diagnosis not present

## 2022-04-12 NOTE — Therapy (Signed)
[place] ?[facility] ?[address] ?[place], [place], [address] ?Phone: [phone]   Fax:  [phone] ? ?Physical Therapy Treatment ? ?Patient Details  ?Name: [name] [name] ?MRN: [id number] ?Date of Birth: 07-22-1941 ?Referring Provider (PT): Marton Redwood MD. ? ? ?Encounter Date: 04/12/2022 ? ? PT End of Session - 04/12/22 1306   ? ? Visit Number 8   ? Number of Visits 12   ? Authorization Type FOTO AT LEAST EVERY 5TH VISIT.  PROGRESS NOTE AT 10TH VISIT.  KX MODIFIER AFTER 15 VISITS.   ? PT Start Time 2585   ? PT Stop Time 0215   ? PT Time Calculation (min) 790 min   ? Activity Tolerance Patient tolerated treatment well   ? Behavior During Therapy [facility] for tasks assessed/performed   ? ?  ?  ? ?  ? ? ?Past Medical History:  ?Diagnosis Date  ? Aortic valve stenosis, mild 02/2016  ? Echo February 2017: possible bicuspid w/ moderate thickened and calicified, AVA ~2.77 cm? -no symptoms; June 2022: Hyperdynamic EF 75%.  Mild to moderate AS-mean gradient 21 mmHg  ? COPD (chronic obstructive pulmonary disease) ([place])   ? GERD (gastroesophageal reflux disease)   ? no problems since started on CPAP  ? Hematuria   ? History of adenomatous polyp of colon   ? tubular adnenoma's and hyperplastic polyp's 05-19-2002;  08-15-2010;  2015  ? History of kidney stones   ? multiple since 1970's  ? History of squamous cell carcinoma excision   ? 05-14-2012  right arm;  04-18-2015  left ankle  ? HTN (hypertension)   ? Hyperlipidemia   ? Iron deficiency anemia   ? Left ureteral stone   ? OSA on CPAP   ? per study 10-13-2006 mild to moderate osa w/ hypersomnia  ? Seasonal and perennial allergic rhinitis   ? Type 2 diabetes mellitus ([employer])   ? type 2  ? Urgency of urination   ? ? ?Past Surgical History:  ?Procedure Laterality Date  ? BIOPSY  03/29/2020  ? Procedure: BIOPSY;  Surgeon: Clarene Essex, MD;  Location: [facility];  Service: Endoscopy;;  ? CARPAL TUNNEL RELEASE Bilateral 2003  ? COLONOSCOPY  last  one 2015  ? CT CHEST WITH CONTRAST   ([place] HX)    ? Advanced aortic and branch vessel atherosclerosis. Tortuous thoracic aorta. Normal heart size, without pericardial effusion. Multivessel coronary artery atherosclerosis  ? CYSTOSCOPY WITH RETROGRADE PYELOGRAM, URETEROSCOPY AND STENT PLACEMENT Left 04/15/2017  ? Procedure: CYSTOSCOPY WITH LEFT  RETROGRADE PYELOGRAM, URETEROSCOPYwith stone basketry;  Surgeon: Kathie Rhodes, MD;  Location: [facility];  Service: Urology;  Laterality: Left;  ? D & C HYSTEROSCOPY W/ RESECTION FIBROID AND POLYP  07/30/2000  ? DOBUTAMINE STRESS ECHO  01-30-2008   [facility]  ? no ischemia/  normal wall motion/  post stress ef 79%  ? ESOPHAGOGASTRODUODENOSCOPY (EGD) WITH PROPOFOL N/A 01/28/2018  ? Procedure: ESOPHAGOGASTRODUODENOSCOPY (EGD) WITH PROPOFOL;  Surgeon: Clarene Essex, MD;  [facility]: [facility];  Service: Endoscopy;  Laterality: N/A;  ? ESOPHAGOGASTRODUODENOSCOPY (EGD) WITH PROPOFOL N/A 08/19/2019  ? Procedure: ESOPHAGOGASTRODUODENOSCOPY (EGD) WITH PROPOFOL;  Surgeon: Clarene Essex, MD;  [facility];  Service: Endoscopy;  Laterality: N/A;  ? ESOPHAGOGASTRODUODENOSCOPY (EGD) WITH PROPOFOL N/A 03/29/2020  ? Procedure: ESOPHAGOGASTRODUODENOSCOPY (EGD) WITH PROPOFOL;  Surgeon: Clarene Essex, MD;  [facility]: [facility];  Service: Endoscopy;  Laterality: N/A;  ? [place]  ? trauma   ? HEMOSTASIS CLIP PLACEMENT  08/19/2019  ?  Procedure: HEMOSTASIS CLIP PLACEMENT;  Surgeon: Clarene Essex, MD;  [facility]: [facility];  Service: Endoscopy;;  ? HOT HEMOSTASIS N/A 01/28/2018  ? Procedure: HOT HEMOSTASIS (ARGON PLASMA COAGULATION/BICAP);  Surgeon: Clarene Essex, MD;  Location: [facility];  Service: Endoscopy;  Laterality: N/A;  ? LAPAROSCOPIC CHOLECYSTECTOMY  08/20/2001  ? w/ ERCP spincterotomy w/ balloon  ? NM MYOVIEW LTD  7/'19; 6/'21  ? LOW RISK.  EF > 65%. No Ischemia or Infarction. Normal Study: Hyperdynamic.  ? POLYPECTOMY  08/19/2019  ? Procedure:  POLYPECTOMY;  Surgeon: Clarene Essex, MD;  [facility]: [facility];  Service: Endoscopy;;  ? TRANSTHORACIC ECHOCARDIOGRAM  02/13/2016  ? LVEF 55-60%. Gr 1 DD. ? possible bicuspid AoV w/ moderate thickened / calcified AoV w/ mild Stenosis (Mean Gradietn 15 mmHg, Valve area 1.34cm^2), no regurg.    ? TRANSTHORACIC ECHOCARDIOGRAM  06/2018  ? a) EF 55-65%. Gr 1 DD. Mild AS (? Functionally bicuspid AoV w/ mod leaflet thickening) - Mean Grad 15 mmHg, Peak 32 mmHg; b) EF 65 to 70% with dynamic mid cavity gradient due to hyperdynamic LV (peak 28 mmHg-at rest).  Moderate LVH.  Mod AS, Mean grad 27 mmHg  ? TRANSTHORACIC ECHOCARDIOGRAM  06/26/2021  ? EF> 75%.  Hyperdynamic function.  Moderate LVH.  Indeterminate filling pressures.  Normal RV.  Moderate aortic valve calcification.  Mean gradient 21 mmHg.  (Mild to Moderate AS)-previous mean gradient was 27 mmHg.  ? TUBAL LIGATION Bilateral yrs ago  ? ? ?There were no vitals filed for this visit. ? ? Subjective Assessment - 04/12/22 1305   ? ? Subjective Pt arrives for today's treatment session 5 mins late.  Pt denies any pain upon arriving for today's treatment session.   ? Pertinent History CTR, DM, COPD, GERD, HTN.   ? How long can you sit comfortably? No problem.   ? How long can you stand comfortably? Varies.   ? How long can you walk comfortably? Short community distances.  Recommend she use a cane.   ? Patient Stated Goals Get out of pain and be able to do more.   ? Currently in Pain? No/denies   ? Pain Onset More than a month ago   ? ?  ?  ? ?  ? ? ? ? ? ? ? ? ? ? ? ? ? ? ? ? ? ? ? ? [place] Adult PT Treatment/Exercise - 04/12/22 0001   ? ?  ? Lumbar Exercises: Aerobic  ? Nustep Lvl 4 x 10 mins   ?  ? Modalities  ? Modalities Traction;Electrical Stimulation   ?  ? Electrical Stimulation  ? Electrical Stimulation Location Bilateral low back   ? Electrical Stimulation Action IFC   ? Electrical Stimulation Parameters 80-150 Hz x 15 mins   ? Electrical Stimulation Goals Pain;Tone    ?  ? Traction  ? Type of Traction Lumbar   ? Min (lbs) 15   ? Max (lbs) 83   ? Hold Time 99   ? Rest Time 5   ? Time 15   ? ?  ?  ? ?  ? ? ? ? ? ? ? ? ? ? ? ? ? ? ? PT Long Term Goals - 03/21/22 1149   ? ?  ? PT LONG TERM GOAL #1  ? Title Independent with an HEP.   ? Time 6   ? Period Weeks   ? Status On-going   ?  ? PT LONG TERM GOAL #2  ? Title  Eliminate right thigh numbness.   ? Time 6   ? Period Weeks   ? Status On-going   ?  ? PT LONG TERM GOAL #3  ? Title Stand in erect posture.   ? Time 6   ? Period Weeks   ? Status On-going   ?  ? PT LONG TERM GOAL #4  ? Title Perform ADL's with pain not > 3-4/10.   ? Time 6   ? Period Weeks   ? Status On-going   ? ?  ?  ? ?  ? ? ? ? ? ? ? ? Plan - 04/12/22 1306   ? ? Clinical Impression Statement Pt arrives for today's treatment session denying in pain.  Pt states that she has felt very good since her last treatment session.  Pt declined increase in weight today on lumbar traction due to feeling so much better after last session.   ? Personal Factors and Comorbidities Comorbidity 1;Other   ? Comorbidities CTR, DM, COPD, GERD, HTN.   ? Examination-Activity Limitations Bed Mobility;Transfers;Locomotion Level;Other   ? Examination-Participation Restrictions Other;Meal Prep   ? Stability/Clinical Decision Making Evolving/Moderate complexity   ? Rehab Potential Good   ? PT Frequency 2x / week   ? PT Duration 6 weeks   ? PT Treatment/Interventions ADLs/Self Care Home Management;Cryotherapy;Electrical Stimulation;Ultrasound;Traction;Moist Heat;Functional mobility training;Therapeutic activities;Therapeutic exercise;Manual techniques;Patient/family education;Passive range of motion;Dry needling   ? PT Next Visit Plan Continue functional training and strengthening with traction.   ? Consulted and Agree with Plan of Care Patient   ? ?  ?  ? ?  ? ? ?Patient will benefit from skilled therapeutic intervention in order to improve the following deficits and impairments:  Abnormal gait,  Increased muscle spasms, Decreased activity tolerance, Decreased range of motion, Postural dysfunction, Pain ? ?Visit Diagnosis: ?Chronic right-sided low back pain with right-sided sciatica ? ?Abnormal

## 2022-04-17 ENCOUNTER — Ambulatory Visit: Payer: Medicare Other

## 2022-04-17 DIAGNOSIS — R293 Abnormal posture: Secondary | ICD-10-CM

## 2022-04-17 DIAGNOSIS — G8929 Other chronic pain: Secondary | ICD-10-CM

## 2022-04-17 DIAGNOSIS — M5441 Lumbago with sciatica, right side: Secondary | ICD-10-CM | POA: Diagnosis not present

## 2022-04-17 NOTE — Therapy (Signed)
[place] ?[facility] ?[address] ?[place], [place], [address] ?Phone: [phone]   Fax:  [phone] ? ?Physical Therapy Treatment ? ?Patient Details  ?Name: [name] [name] ?MRN: [id number] ?Date of Birth: April 20, 1941 ?Referring Provider (PT): Marton Redwood MD. ? ? ?Encounter Date: 04/17/2022 ? ? PT End of Session - 04/17/22 1305   ? ? Visit Number 9   ? Number of Visits 12   ? Date for PT Re-Evaluation 04/25/22   ? Authorization Type FOTO AT LEAST EVERY 5TH VISIT.  PROGRESS NOTE AT 10TH VISIT.  KX MODIFIER AFTER 15 VISITS.   ? PT Start Time 1302   ? PT Stop Time 1348   ? PT Time Calculation (min) 46 min   ? Activity Tolerance Patient tolerated treatment well   ? Behavior During Therapy [facility] for tasks assessed/performed   ? ?  ?  ? ?  ? ? ?Past Medical History:  ?Diagnosis Date  ? Aortic valve stenosis, mild 02/2016  ? Echo February 2017: possible bicuspid w/ moderate thickened and calicified, AVA ~5.73 cm? -no symptoms; June 2022: Hyperdynamic EF 75%.  Mild to moderate AS-mean gradient 21 mmHg  ? COPD (chronic obstructive pulmonary disease) ([place])   ? GERD (gastroesophageal reflux disease)   ? no problems since started on CPAP  ? Hematuria   ? History of adenomatous polyp of colon   ? tubular adnenoma's and hyperplastic polyp's 05-19-2002;  08-15-2010;  2015  ? History of kidney stones   ? multiple since 1970's  ? History of squamous cell carcinoma excision   ? 05-14-2012  right arm;  04-18-2015  left ankle  ? HTN (hypertension)   ? Hyperlipidemia   ? Iron deficiency anemia   ? Left ureteral stone   ? OSA on CPAP   ? per study 10-13-2006 mild to moderate osa w/ hypersomnia  ? Seasonal and perennial allergic rhinitis   ? Type 2 diabetes mellitus ([place])   ? type 2  ? Urgency of urination   ? ? ?Past Surgical History:  ?Procedure Laterality Date  ? BIOPSY  03/29/2020  ? Procedure: BIOPSY;  Surgeon: Clarene Essex, MD;  Location: [facility];  Service: Endoscopy;;  ? CARPAL TUNNEL  RELEASE Bilateral 2003  ? COLONOSCOPY  last one 2015  ? CT CHEST WITH CONTRAST   ([place] HX)    ? Advanced aortic and branch vessel atherosclerosis. Tortuous thoracic aorta. Normal heart size, without pericardial effusion. Multivessel coronary artery atherosclerosis  ? CYSTOSCOPY WITH RETROGRADE PYELOGRAM, URETEROSCOPY AND STENT PLACEMENT Left 04/15/2017  ? Procedure: CYSTOSCOPY WITH LEFT  RETROGRADE PYELOGRAM, URETEROSCOPYwith stone basketry;  Surgeon: Kathie Rhodes, MD;  Location: [facility];  Service: Urology;  Laterality: Left;  ? D & C HYSTEROSCOPY W/ RESECTION FIBROID AND POLYP  07/30/2000  ? DOBUTAMINE STRESS ECHO  01-30-2008   [facility]  ? no ischemia/  normal wall motion/  post stress ef 79%  ? ESOPHAGOGASTRODUODENOSCOPY (EGD) WITH PROPOFOL N/A 01/28/2018  ? Procedure: ESOPHAGOGASTRODUODENOSCOPY (EGD) WITH PROPOFOL;  Surgeon: Clarene Essex, MD;  Location: [facility];  Service: Endoscopy;  Laterality: N/A;  ? ESOPHAGOGASTRODUODENOSCOPY (EGD) WITH PROPOFOL N/A 08/19/2019  ? Procedure: ESOPHAGOGASTRODUODENOSCOPY (EGD) WITH PROPOFOL;  Surgeon: Clarene Essex, MD;  [facility]: [facility];  Service: Endoscopy;  Laterality: N/A;  ? ESOPHAGOGASTRODUODENOSCOPY (EGD) WITH PROPOFOL N/A 03/29/2020  ? Procedure: ESOPHAGOGASTRODUODENOSCOPY (EGD) WITH PROPOFOL;  Surgeon: Clarene Essex, MD;  Location: [facility];  Service: Endoscopy;  Laterality: N/A;  ? [place]  ? trauma   ?  HEMOSTASIS CLIP PLACEMENT  08/19/2019  ? Procedure: HEMOSTASIS CLIP PLACEMENT;  Surgeon: Clarene Essex, MD;  Location: [facility];  Service: Endoscopy;;  ? HOT HEMOSTASIS N/A 01/28/2018  ? Procedure: HOT HEMOSTASIS (ARGON PLASMA COAGULATION/BICAP);  Surgeon: Clarene Essex, MD;  Location: [facility];  Service: Endoscopy;  Laterality: N/A;  ? LAPAROSCOPIC CHOLECYSTECTOMY  08/20/2001  ? w/ ERCP spincterotomy w/ balloon  ? NM MYOVIEW LTD  7/'19; 6/'21  ? LOW RISK.  EF > 65%. No Ischemia or Infarction. Normal Study: Hyperdynamic.   ? POLYPECTOMY  08/19/2019  ? Procedure: POLYPECTOMY;  Surgeon: Clarene Essex, MD;  [facility]: [facility];  Service: Endoscopy;;  ? TRANSTHORACIC ECHOCARDIOGRAM  02/13/2016  ? LVEF 55-60%. Gr 1 DD. ? possible bicuspid AoV w/ moderate thickened / calcified AoV w/ mild Stenosis (Mean Gradietn 15 mmHg, Valve area 1.34cm^2), no regurg.    ? TRANSTHORACIC ECHOCARDIOGRAM  06/2018  ? a) EF 55-65%. Gr 1 DD. Mild AS (? Functionally bicuspid AoV w/ mod leaflet thickening) - Mean Grad 15 mmHg, Peak 32 mmHg; b) EF 65 to 70% with dynamic mid cavity gradient due to hyperdynamic LV (peak 28 mmHg-at rest).  Moderate LVH.  Mod AS, Mean grad 27 mmHg  ? TRANSTHORACIC ECHOCARDIOGRAM  06/26/2021  ? EF> 75%.  Hyperdynamic function.  Moderate LVH.  Indeterminate filling pressures.  Normal RV.  Moderate aortic valve calcification.  Mean gradient 21 mmHg.  (Mild to Moderate AS)-previous mean gradient was 27 mmHg.  ? TUBAL LIGATION Bilateral yrs ago  ? ? ?There were no vitals filed for this visit. ? ? Subjective Assessment - 04/17/22 1304   ? ? Subjective Patient reports that her back has not been hurting recently. She notes that she had some sharp pain on the outside of her right knee yesterday.   ? Pertinent History CTR, DM, COPD, GERD, HTN.   ? How long can you sit comfortably? No problem.   ? How long can you stand comfortably? Varies.   ? How long can you walk comfortably? Short community distances.  Recommend she use a cane.   ? Patient Stated Goals Get out of pain and be able to do more.   ? Currently in Pain? No/denies   ? Pain Onset More than a month ago   ? ?  ?  ? ?  ? ? ? ? ? ? ? ? ? ? ? ? ? ? ? ? ? ? ? ? [place] Adult PT Treatment/Exercise - 04/17/22 0001   ? ?  ? Lumbar Exercises: Stretches  ? Lower Trunk Rotation Other (comment)   3 minutes  ?  ? Lumbar Exercises: Aerobic  ? Nustep L4 x 16 minutes   ?  ? Lumbar Exercises: Standing  ? Other Standing Lumbar Exercises Ball press   2 minutes; 5 second hold  ?  ? Lumbar Exercises:  Seated  ? Other Seated Lumbar Exercises Ball roll out   2 minutes  ? Other Seated Lumbar Exercises Open books   standing; 2 minutes  ?  ? Lumbar Exercises: Supine  ? Bridge Non-compliant;Other (comment)   3 minutes  ? Straight Leg Raise 20 reps   BLE  ? ?  ?  ? ?  ? ? ? ? ? ? ? ? ? ? ? ? ? ? ? PT Long Term Goals - 03/21/22 1149   ? ?  ? PT LONG TERM GOAL #1  ? Title Independent with an HEP.   ? Time 6   ? Period  Weeks   ? Status On-going   ?  ? PT LONG TERM GOAL #2  ? Title Eliminate right thigh numbness.   ? Time 6   ? Period Weeks   ? Status On-going   ?  ? PT LONG TERM GOAL #3  ? Title Stand in erect posture.   ? Time 6   ? Period Weeks   ? Status On-going   ?  ? PT LONG TERM GOAL #4  ? Title Perform ADL's with pain not > 3-4/10.   ? Time 6   ? Period Weeks   ? Status On-going   ? ?  ?  ? ?  ? ? ? ? ? ? ? ? Plan - 04/17/22 1306   ? ? Clinical Impression Statement Patient was introduced to multiple new standing, seated, and supine interventions for improved lumbar mobiltiy, strength, and stability. She required minimal cuing with today's new interventions for proper exercise performance to avoid compensating with surrounding musculature. She reported no pain or discomfort with any of today's interventions. She reported that she felt great upon the conclusion of treatment. Her HEP was updated to include today's new interventions to reduce her pain and improve her functional mobility throughout the day while completing her daily activities. She continues to require skilled physical therapy to address her remaining impairments to return to her prior level of function.   ? Personal Factors and Comorbidities Comorbidity 1;Other   ? Comorbidities CTR, DM, COPD, GERD, HTN.   ? Examination-Activity Limitations Bed Mobility;Transfers;Locomotion Level;Other   ? Examination-Participation Restrictions Other;Meal Prep   ? Stability/Clinical Decision Making Evolving/Moderate complexity   ? Rehab Potential Good   ? PT Frequency  2x / week   ? PT Duration 6 weeks   ? PT Treatment/Interventions ADLs/Self Care Home Management;Cryotherapy;Electrical Stimulation;Ultrasound;Traction;Moist Heat;Functional mobility training;Therapeutic Marya Fossa

## 2022-04-19 ENCOUNTER — Ambulatory Visit: Payer: Medicare Other | Admitting: *Deleted

## 2022-04-19 DIAGNOSIS — G8929 Other chronic pain: Secondary | ICD-10-CM | POA: Diagnosis not present

## 2022-04-19 DIAGNOSIS — M5441 Lumbago with sciatica, right side: Secondary | ICD-10-CM | POA: Diagnosis not present

## 2022-04-19 DIAGNOSIS — R293 Abnormal posture: Secondary | ICD-10-CM

## 2022-04-19 NOTE — Therapy (Signed)
[place] ?[facility] ?[place] ?[place], [place], [address] ?Phone: [phone]   Fax:  [phone] ? ?Physical Therapy Treatment ? ?Patient Details  ?Name: [name] [name] ?MRN: [id number] ?Date of Birth: 1941/05/23 ?Referring Provider (PT): Marton Redwood MD. ? ? ?Encounter Date: 04/19/2022 ? ? PT End of Session - 04/19/22 1111   ? ? Visit Number 10   ? Number of Visits 12   ? Date for PT Re-Evaluation 04/25/22   ? Authorization Type FOTO AT LEAST EVERY 5TH VISIT.  PROGRESS NOTE AT 10TH VISIT.  KX MODIFIER AFTER 15 VISITS.   ? PT Start Time 1030   ? PT Stop Time 1120   ? PT Time Calculation (min) 50 min   ? Activity Tolerance Patient tolerated treatment well   ? Behavior During Therapy [facility] for tasks assessed/performed   ? ?  ?  ? ?  ? ? ?Past Medical History:  ?Diagnosis Date  ? Aortic valve stenosis, mild 02/2016  ? Echo February 2017: possible bicuspid w/ moderate thickened and calicified, AVA ~2.77 cm? -no symptoms; June 2022: Hyperdynamic EF 75%.  Mild to moderate AS-mean gradient 21 mmHg  ? COPD (chronic obstructive pulmonary disease) ([place])   ? GERD (gastroesophageal reflux disease)   ? no problems since started on CPAP  ? Hematuria   ? History of adenomatous polyp of colon   ? tubular adnenoma's and hyperplastic polyp's 05-19-2002;  08-15-2010;  2015  ? History of kidney stones   ? multiple since 1970's  ? History of squamous cell carcinoma excision   ? 05-14-2012  right arm;  04-18-2015  left ankle  ? HTN (hypertension)   ? Hyperlipidemia   ? Iron deficiency anemia   ? Left ureteral stone   ? OSA on CPAP   ? per study 10-13-2006 mild to moderate osa w/ hypersomnia  ? Seasonal and perennial allergic rhinitis   ? Type 2 diabetes mellitus ([place])   ? type 2  ? Urgency of urination   ? ? ?Past Surgical History:  ?Procedure Laterality Date  ? BIOPSY  03/29/2020  ? Procedure: BIOPSY;  Surgeon: Clarene Essex, MD;  [facility];  Service: Endoscopy;;  ? CARPAL TUNNEL  RELEASE Bilateral 2003  ? COLONOSCOPY  last one 2015  ? CT CHEST WITH CONTRAST   ([place] HX)    ? Advanced aortic and branch vessel atherosclerosis. Tortuous thoracic aorta. Normal heart size, without pericardial effusion. Multivessel coronary artery atherosclerosis  ? CYSTOSCOPY WITH RETROGRADE PYELOGRAM, URETEROSCOPY AND STENT PLACEMENT Left 04/15/2017  ? Procedure: CYSTOSCOPY WITH LEFT  RETROGRADE PYELOGRAM, URETEROSCOPYwith stone basketry;  Surgeon: Kathie Rhodes, MD;  Location: [facility];  Service: Urology;  Laterality: Left;  ? D & C HYSTEROSCOPY W/ RESECTION FIBROID AND POLYP  07/30/2000  ? DOBUTAMINE STRESS ECHO  01-30-2008   [facility]  ? no ischemia/  normal wall motion/  post stress ef 79%  ? ESOPHAGOGASTRODUODENOSCOPY (EGD) WITH PROPOFOL N/A 01/28/2018  ? Procedure: ESOPHAGOGASTRODUODENOSCOPY (EGD) WITH PROPOFOL;  Surgeon: Clarene Essex, MD;  [facility];  Service: Endoscopy;  Laterality: N/A;  ? ESOPHAGOGASTRODUODENOSCOPY (EGD) WITH PROPOFOL N/A 08/19/2019  ? Procedure: ESOPHAGOGASTRODUODENOSCOPY (EGD) WITH PROPOFOL;  Surgeon: Clarene Essex, MD;  [facility]: [facility];  Service: Endoscopy;  Laterality: N/A;  ? ESOPHAGOGASTRODUODENOSCOPY (EGD) WITH PROPOFOL N/A 03/29/2020  ? Procedure: ESOPHAGOGASTRODUODENOSCOPY (EGD) WITH PROPOFOL;  Surgeon: Clarene Essex, MD;  [facility];  Service: Endoscopy;  Laterality: N/A;  ? [place]  ? trauma   ?  HEMOSTASIS CLIP PLACEMENT  08/19/2019  ? Procedure: HEMOSTASIS CLIP PLACEMENT;  Surgeon: Clarene Essex, MD;  [facility];  Service: Endoscopy;;  ? HOT HEMOSTASIS N/A 01/28/2018  ? Procedure: HOT HEMOSTASIS (ARGON PLASMA COAGULATION/BICAP);  Surgeon: Clarene Essex, MD;  Location: [facility];  Service: Endoscopy;  Laterality: N/A;  ? LAPAROSCOPIC CHOLECYSTECTOMY  08/20/2001  ? w/ ERCP spincterotomy w/ balloon  ? NM MYOVIEW LTD  7/'19; 6/'21  ? LOW RISK.  EF > 65%. No Ischemia or Infarction. Normal Study: Hyperdynamic.   ? POLYPECTOMY  08/19/2019  ? Procedure: POLYPECTOMY;  Surgeon: Clarene Essex, MD;  [facility]: [facility];  Service: Endoscopy;;  ? TRANSTHORACIC ECHOCARDIOGRAM  02/13/2016  ? LVEF 55-60%. Gr 1 DD. ? possible bicuspid AoV w/ moderate thickened / calcified AoV w/ mild Stenosis (Mean Gradietn 15 mmHg, Valve area 1.34cm^2), no regurg.    ? TRANSTHORACIC ECHOCARDIOGRAM  06/2018  ? a) EF 55-65%. Gr 1 DD. Mild AS (? Functionally bicuspid AoV w/ mod leaflet thickening) - Mean Grad 15 mmHg, Peak 32 mmHg; b) EF 65 to 70% with dynamic mid cavity gradient due to hyperdynamic LV (peak 28 mmHg-at rest).  Moderate LVH.  Mod AS, Mean grad 27 mmHg  ? TRANSTHORACIC ECHOCARDIOGRAM  06/26/2021  ? EF> 75%.  Hyperdynamic function.  Moderate LVH.  Indeterminate filling pressures.  Normal RV.  Moderate aortic valve calcification.  Mean gradient 21 mmHg.  (Mild to Moderate AS)-previous mean gradient was 27 mmHg.  ? TUBAL LIGATION Bilateral yrs ago  ? ? ?There were no vitals filed for this visit. ? ? Subjective Assessment - 04/19/22 1035   ? ? Subjective Having increased LBP today. Would like traction and Estim today   ? Pertinent History CTR, DM, COPD, GERD, HTN.   ? How long can you sit comfortably? No problem.   ? How long can you stand comfortably? Varies.   ? How long can you walk comfortably? Short community distances.  Recommend she use a cane.   ? Patient Stated Goals Get out of pain and be able to do more.   ? Currently in Pain? Yes   ? Pain Score 5    ? Pain Location Back   ? Pain Orientation Lower   ? Pain Descriptors / Indicators Discomfort   ? Pain Type Chronic pain   ? Pain Onset More than a month ago   ? ?  ?  ? ?  ? ? ? ? ? ? ? ? ? ? ? ? ? ? ? ? ? ? ? ? [place] Adult PT Treatment/Exercise - 04/19/22 0001   ? ?  ? Exercises  ? Exercises Knee/Hip   ?  ? Lumbar Exercises: Aerobic  ? Nustep L4 x 16 minutes   ?  ? Modalities  ? Modalities Traction;Electrical Stimulation   ?  ? Electrical Stimulation  ? Electrical Stimulation Location  Bilateral low back   ? Electrical Stimulation Action IFC   ? Electrical Stimulation Parameters 80-'150hz'$  x 15 mins   ? Electrical Stimulation Goals Pain;Tone   ?  ? Traction  ? Type of Traction Lumbar   ? Min (lbs) 15   ? Max (lbs) 85   ? Hold Time 99   ? Rest Time 5   ? Time 15   ? ?  ?  ? ?  ? ? ? ? ? ? ? ? ? ? ? ? ? ? ? PT Long Term Goals - 03/21/22 1149   ? ?  ? PT  LONG TERM GOAL #1  ? Title Independent with an HEP.   ? Time 6   ? Period Weeks   ? Status On-going   ?  ? PT LONG TERM GOAL #2  ? Title Eliminate right thigh numbness.   ? Time 6   ? Period Weeks   ? Status On-going   ?  ? PT LONG TERM GOAL #3  ? Title Stand in erect posture.   ? Time 6   ? Period Weeks   ? Status On-going   ?  ? PT LONG TERM GOAL #4  ? Title Perform ADL's with pain not > 3-4/10.   ? Time 6   ? Period Weeks   ? Status On-going   ? ?  ?  ? ?  ? ? ? ? ? ? ? ? Plan - 04/19/22 1126   ? ? Clinical Impression Statement Pt arrived today doing fairly well and reports Rxs are helping and doing some of the exs at home. She was able to perform exs without increased pain and Traction was performed at 85 #s and tolerated well. Estim end of Rx for pain control. Pt reports decreased pain end of session.   ? Personal Factors and Comorbidities Comorbidity 1;Other   ? Comorbidities CTR, DM, COPD, GERD, HTN.   ? Examination-Activity Limitations Bed Mobility;Transfers;Locomotion Level;Other   ? Examination-Participation Restrictions Other;Meal Prep   ? Stability/Clinical Decision Making Evolving/Moderate complexity   ? Rehab Potential Good   ? PT Frequency 2x / week   ? PT Duration 6 weeks   ? PT Treatment/Interventions ADLs/Self Care Home Management;Cryotherapy;Electrical Stimulation;Ultrasound;Traction;Moist Heat;Functional mobility training;Therapeutic activities;Therapeutic exercise;Manual techniques;Patient/family education;Passive range of motion;Dry needling   ? PT Next Visit Plan Continue functional training and strengthening with traction.  Assess traction at 85#s   ? Consulted and Agree with Plan of Care Patient   ? ?  ?  ? ?  ? ? ?Patient will benefit from skilled therapeutic intervention in order to improve the following deficits and impairme

## 2022-04-24 ENCOUNTER — Ambulatory Visit: Payer: Medicare Other | Admitting: Physical Therapy

## 2022-04-24 DIAGNOSIS — G8929 Other chronic pain: Secondary | ICD-10-CM

## 2022-04-24 DIAGNOSIS — R293 Abnormal posture: Secondary | ICD-10-CM

## 2022-04-24 DIAGNOSIS — M5441 Lumbago with sciatica, right side: Secondary | ICD-10-CM | POA: Diagnosis not present

## 2022-04-24 NOTE — Therapy (Signed)
[place] [facility] ?[place] ?[place], [place], [address] ?Phone: [phone]   Fax:  [phone] ? ?Physical Therapy Treatment ? ?Patient Details  ?Name: [name] [name] ?MRN: [id number] ?Date of Birth: [birth year] ?Referring Provider (PT): Marton Redwood MD. ? ? ?Encounter Date: 04/24/2022 ? ? PT End of Session - 04/24/22 1212   ? ? Visit Number 11   ? Number of Visits 12   ? Date for PT Re-Evaluation 04/25/22   ? Authorization Type FOTO AT LEAST EVERY 5TH VISIT.  PROGRESS NOTE AT 10TH VISIT.  KX MODIFIER AFTER 15 VISITS.   ? PT Start Time 1030   ? PT Stop Time 3888   ? PT Time Calculation (min) 56 min   ? Behavior During Therapy [facility] for tasks assessed/performed   ? ?  ?  ? ?  ? ? ?Past Medical History:  ?Diagnosis Date  ? Aortic valve stenosis, mild 02/2016  ? Echo February 2017: possible bicuspid w/ moderate thickened and calicified, AVA ~2.80 cm? -no symptoms; June 2022: Hyperdynamic EF 75%.  Mild to moderate AS-mean gradient 21 mmHg  ? COPD (chronic obstructive pulmonary disease) ([place])   ? GERD (gastroesophageal reflux disease)   ? no problems since started on CPAP  ? Hematuria   ? History of adenomatous polyp of colon   ? tubular adnenoma's and hyperplastic polyp's 05-19-2002;  08-15-2010;  2015  ? History of kidney stones   ? multiple since 1970's  ? History of squamous cell carcinoma excision   ? 05-14-2012  right arm;  04-18-2015  left ankle  ? HTN (hypertension)   ? Hyperlipidemia   ? Iron deficiency anemia   ? Left ureteral stone   ? OSA on CPAP   ? per study 10-13-2006 mild to moderate osa w/ hypersomnia  ? Seasonal and perennial allergic rhinitis   ? Type 2 diabetes mellitus ([place])   ? type 2  ? Urgency of urination   ? ? ?Past Surgical History:  ?Procedure Laterality Date  ? BIOPSY  03/29/2020  ? Procedure: BIOPSY;  Surgeon: Clarene Essex, MD;  Location: [facility];  Service: Endoscopy;;  ? CARPAL TUNNEL RELEASE Bilateral 2003  ? COLONOSCOPY  last one 2015  ? CT  CHEST WITH CONTRAST   ([place] HX)    ? Advanced aortic and branch vessel atherosclerosis. Tortuous thoracic aorta. Normal heart size, without pericardial effusion. Multivessel coronary artery atherosclerosis  ? CYSTOSCOPY WITH RETROGRADE PYELOGRAM, URETEROSCOPY AND STENT PLACEMENT Left 04/15/2017  ? Procedure: CYSTOSCOPY WITH LEFT  RETROGRADE PYELOGRAM, URETEROSCOPYwith stone basketry;  Surgeon: Kathie Rhodes, MD;  Location: [facility];  Service: Urology;  Laterality: Left;  ? D & C HYSTEROSCOPY W/ RESECTION FIBROID AND POLYP  07/30/2000  ? DOBUTAMINE STRESS ECHO  01-30-2008   [facility]  ? no ischemia/  normal wall motion/  post stress ef 79%  ? ESOPHAGOGASTRODUODENOSCOPY (EGD) WITH PROPOFOL N/A 01/28/2018  ? Procedure: ESOPHAGOGASTRODUODENOSCOPY (EGD) WITH PROPOFOL;  Surgeon: Clarene Essex, MD;  [facility];  Service: Endoscopy;  Laterality: N/A;  ? ESOPHAGOGASTRODUODENOSCOPY (EGD) WITH PROPOFOL N/A 08/19/2019  ? Procedure: ESOPHAGOGASTRODUODENOSCOPY (EGD) WITH PROPOFOL;  Surgeon: Clarene Essex, MD;  [facility];  Service: Endoscopy;  Laterality: N/A;  ? ESOPHAGOGASTRODUODENOSCOPY (EGD) WITH PROPOFOL N/A 03/29/2020  ? Procedure: ESOPHAGOGASTRODUODENOSCOPY (EGD) WITH PROPOFOL;  Surgeon: Clarene Essex, MD;  [facility];  Service: Endoscopy;  Laterality: N/A;  ? [place]  ? trauma   ? HEMOSTASIS CLIP PLACEMENT  08/19/2019  ?  Procedure: HEMOSTASIS CLIP PLACEMENT;  Surgeon: Clarene Essex, MD;  [facility];  Service: Endoscopy;;  ? HOT HEMOSTASIS N/A 01/28/2018  ? Procedure: HOT HEMOSTASIS (ARGON PLASMA COAGULATION/BICAP);  Surgeon: Clarene Essex, MD;  Location: [facility];  Service: Endoscopy;  Laterality: N/A;  ? LAPAROSCOPIC CHOLECYSTECTOMY  08/20/2001  ? w/ ERCP spincterotomy w/ balloon  ? NM MYOVIEW LTD  7/'19; 6/'21  ? LOW RISK.  EF > 65%. No Ischemia or Infarction. Normal Study: Hyperdynamic.  ? POLYPECTOMY  08/19/2019  ? Procedure: POLYPECTOMY;   Surgeon: Clarene Essex, MD;  [facility];  Service: Endoscopy;;  ? TRANSTHORACIC ECHOCARDIOGRAM  02/13/2016  ? LVEF 55-60%. Gr 1 DD. ? possible bicuspid AoV w/ moderate thickened / calcified AoV w/ mild Stenosis (Mean Gradietn 15 mmHg, Valve area 1.34cm^2), no regurg.    ? TRANSTHORACIC ECHOCARDIOGRAM  06/2018  ? a) EF 55-65%. Gr 1 DD. Mild AS (? Functionally bicuspid AoV w/ mod leaflet thickening) - Mean Grad 15 mmHg, Peak 32 mmHg; b) EF 65 to 70% with dynamic mid cavity gradient due to hyperdynamic LV (peak 28 mmHg-at rest).  Moderate LVH.  Mod AS, Mean grad 27 mmHg  ? TRANSTHORACIC ECHOCARDIOGRAM  06/26/2021  ? EF> 75%.  Hyperdynamic function.  Moderate LVH.  Indeterminate filling pressures.  Normal RV.  Moderate aortic valve calcification.  Mean gradient 21 mmHg.  (Mild to Moderate AS)-previous mean gradient was 27 mmHg.  ? TUBAL LIGATION Bilateral yrs ago  ? ? ?There were no vitals filed for this visit. ? ? Subjective Assessment - 04/24/22 1212   ? ? Subjective Doing really well today.   ? Pertinent History CTR, DM, COPD, GERD, HTN.   ? How long can you sit comfortably? No problem.   ? How long can you stand comfortably? Varies.   ? How long can you walk comfortably? Short community distances.  Recommend she use a cane.   ? Patient Stated Goals Get out of pain and be able to do more.   ? Currently in Pain? Yes   ? Pain Score 2    ? Pain Location Back   ? Pain Orientation Lower   ? Pain Descriptors / Indicators Discomfort   ? Pain Type Chronic pain   ? Pain Onset More than a month ago   ? ?  ?  ? ?  ? ? ? ? ? ? ? ? ? ? ? ? ? ? ? ? ? ? ? ? [place] Adult PT Treatment/Exercise - 04/24/22 0001   ? ?  ? Exercises  ? Exercises Knee/Hip   ?  ? Lumbar Exercises: Aerobic  ? Nustep Level 4 x 15 minutes.   ?  ? Electrical Stimulation  ? Electrical Stimulation Location Bil LB   ? Electrical Stimulation Action IFC at 80-150 Hz.   ? Electrical Stimulation Parameters 40% scan x 15 minutes.   ? Electrical Stimulation  Goals Pain;Tone   ?  ? Traction  ? Type of Traction Lumbar   ? Min (lbs) 15   ? Max (lbs) 87   ? Hold Time 99   ? Rest Time 5   ? Time 15   ? ?  ?  ? ?  ? ? ? ? ? ? ? ? ? ? ? ? ? ? ? PT Long Term Goals - 03/21/22 1149   ? ?  ? PT LONG TERM GOAL #1  ? Title Independent with an HEP.   ? Time 6   ? Period  Weeks   ? Status On-going   ?  ? PT LONG TERM GOAL #2  ? Title Eliminate right thigh numbness.   ? Time 6   ? Period Weeks   ? Status On-going   ?  ? PT LONG TERM GOAL #3  ? Title Stand in erect posture.   ? Time 6   ? Period Weeks   ? Status On-going   ?  ? PT LONG TERM GOAL #4  ? Title Perform ADL's with pain not > 3-4/10.   ? Time 6   ? Period Weeks   ? Status On-going   ? ?  ?  ? ?  ? ? ? ? ? ? ? ? Plan - 04/24/22 1215   ? ? Clinical Impression Statement Patient with lowered pain-level today.  She tolerated a 2# increase in traction without complaint.   ? Personal Factors and Comorbidities Comorbidity 1;Other   ? Comorbidities CTR, DM, COPD, GERD, HTN.   ? Examination-Activity Limitations Bed Mobility;Transfers;Locomotion Level;Other   ? Examination-Participation Restrictions Other;Meal Prep   ? Stability/Clinical Decision Making Evolving/Moderate complexity   ? Rehab Potential Good   ? PT Frequency 2x / week   ? PT Duration 6 weeks   ? PT Treatment/Interventions ADLs/Self Care Home Management;Cryotherapy;Electrical Stimulation;Ultrasound;Traction;Moist Heat;Functional mobility training;Therapeutic activities;Therapeutic exercise;Manual techniques;Patient/family education;Passive range of motion;Dry needling   ? PT Next Visit Plan Traction to 90#.   ? PT Home Exercise Plan Access Code: [email or web address]  URL: [email or web address]  Date: 04/17/2022  Prepared by: Jacqulynn Cadet    Exercises  - Standing Thoracic Open Book at [place] x daily - 7 x weekly - 3 sets - 10 reps  - Seated 3 Way Exercise Ball Roll Out Stretch  - 1 x daily - 7 x weekly - 3 sets - 10 reps  - Seated Abdominal Press into [employer]  - 1 x  daily - 7 x weekly - 3 sets - 10 reps - 3-5 seconds hold  - Lower Trunk Rotations  - 1 x daily - 7 x weekly - 3 sets - 10 reps  - Supine Bridge  - 1 x daily - 7 x weekly - 3 sets - 10 reps  - Supine Active St

## 2022-04-26 ENCOUNTER — Ambulatory Visit: Payer: Medicare Other

## 2022-04-26 DIAGNOSIS — G8929 Other chronic pain: Secondary | ICD-10-CM | POA: Diagnosis not present

## 2022-04-26 DIAGNOSIS — R293 Abnormal posture: Secondary | ICD-10-CM | POA: Diagnosis not present

## 2022-04-26 DIAGNOSIS — Z20822 Contact with and (suspected) exposure to covid-19: Secondary | ICD-10-CM | POA: Diagnosis not present

## 2022-04-26 DIAGNOSIS — M5441 Lumbago with sciatica, right side: Secondary | ICD-10-CM | POA: Diagnosis not present

## 2022-04-26 NOTE — Therapy (Signed)
[place] ?[facility] ?[place] ?[place], [place], [address] ?Phone: [phone]   Fax:  [phone] ? ?Physical Therapy Treatment ? ?Patient Details  ?Name: [name] [name] ?MRN: [id number] ?Date of Birth: [birth year] ?Referring Provider (PT): Marton Redwood MD. ? ? ?Encounter Date: 04/26/2022 ? ? PT End of Session - 04/26/22 1037   ? ? Visit Number 12   ? Number of Visits 16   ? Date for PT Re-Evaluation 06/08/22   ? Authorization Type FOTO AT LEAST EVERY 5TH VISIT.  PROGRESS NOTE AT 10TH VISIT.  KX MODIFIER AFTER 15 VISITS.   ? PT Start Time 1031   ? PT Stop Time 1130   ? PT Time Calculation (min) 59 min   ? Activity Tolerance Patient tolerated treatment well   ? Behavior During Therapy [facility] for tasks assessed/performed   ? ?  ?  ? ?  ? ? ?Past Medical History:  ?Diagnosis Date  ? Aortic valve stenosis, mild 02/2016  ? Echo February 2017: possible bicuspid w/ moderate thickened and calicified, AVA ~7.51 cm? -no symptoms; June 2022: Hyperdynamic EF 75%.  Mild to moderate AS-mean gradient 21 mmHg  ? COPD (chronic obstructive pulmonary disease) ([place])   ? GERD (gastroesophageal reflux disease)   ? no problems since started on CPAP  ? Hematuria   ? History of adenomatous polyp of colon   ? tubular adnenoma's and hyperplastic polyp's 05-19-2002;  08-15-2010;  2015  ? History of kidney stones   ? multiple since 1970's  ? History of squamous cell carcinoma excision   ? 05-14-2012  right arm;  04-18-2015  left ankle  ? HTN (hypertension)   ? Hyperlipidemia   ? Iron deficiency anemia   ? Left ureteral stone   ? OSA on CPAP   ? per study 10-13-2006 mild to moderate osa w/ hypersomnia  ? Seasonal and perennial allergic rhinitis   ? Type 2 diabetes mellitus ([place])   ? type 2  ? Urgency of urination   ? ? ?Past Surgical History:  ?Procedure Laterality Date  ? BIOPSY  03/29/2020  ? Procedure: BIOPSY;  Surgeon: Clarene Essex, MD;  [facility];  Service: Endoscopy;;  ? CARPAL TUNNEL  RELEASE Bilateral 2003  ? COLONOSCOPY  last one 2015  ? CT CHEST WITH CONTRAST   ([place] HX)    ? Advanced aortic and branch vessel atherosclerosis. Tortuous thoracic aorta. Normal heart size, without pericardial effusion. Multivessel coronary artery atherosclerosis  ? CYSTOSCOPY WITH RETROGRADE PYELOGRAM, URETEROSCOPY AND STENT PLACEMENT Left 04/15/2017  ? Procedure: CYSTOSCOPY WITH LEFT  RETROGRADE PYELOGRAM, URETEROSCOPYwith stone basketry;  Surgeon: Kathie Rhodes, MD;  Location: [facility];  Service: Urology;  Laterality: Left;  ? D & C HYSTEROSCOPY W/ RESECTION FIBROID AND POLYP  07/30/2000  ? DOBUTAMINE STRESS ECHO  01-30-2008   [facility]  ? no ischemia/  normal wall motion/  post stress ef 79%  ? ESOPHAGOGASTRODUODENOSCOPY (EGD) WITH PROPOFOL N/A 01/28/2018  ? Procedure: ESOPHAGOGASTRODUODENOSCOPY (EGD) WITH PROPOFOL;  Surgeon: Clarene Essex, MD;  [facility];  Service: Endoscopy;  Laterality: N/A;  ? ESOPHAGOGASTRODUODENOSCOPY (EGD) WITH PROPOFOL N/A 08/19/2019  ? Procedure: ESOPHAGOGASTRODUODENOSCOPY (EGD) WITH PROPOFOL;  Surgeon: Clarene Essex, MD;  [facility];  Service: Endoscopy;  Laterality: N/A;  ? ESOPHAGOGASTRODUODENOSCOPY (EGD) WITH PROPOFOL N/A 03/29/2020  ? Procedure: ESOPHAGOGASTRODUODENOSCOPY (EGD) WITH PROPOFOL;  Surgeon: Clarene Essex, MD;  [facility];  Service: Endoscopy;  Laterality: N/A;  ? [place]  ? trauma   ?  HEMOSTASIS CLIP PLACEMENT  08/19/2019  ? Procedure: HEMOSTASIS CLIP PLACEMENT;  Surgeon: Clarene Essex, MD;  [facility];  Service: Endoscopy;;  ? HOT HEMOSTASIS N/A 01/28/2018  ? Procedure: HOT HEMOSTASIS (ARGON PLASMA COAGULATION/BICAP);  Surgeon: Clarene Essex, MD;  Location[facility];  Service: Endoscopy;  Laterality: N/A;  ? LAPAROSCOPIC CHOLECYSTECTOMY  08/20/2001  ? w/ ERCP spincterotomy w/ balloon  ? NM MYOVIEW LTD  7/'19; 6/'21  ? LOW RISK.  EF > 65%. No Ischemia or Infarction. Normal Study: Hyperdynamic.   ? POLYPECTOMY  08/19/2019  ? Procedure: POLYPECTOMY;  Surgeon: Clarene Essex, MD;  [facility];  Service: Endoscopy;;  ? TRANSTHORACIC ECHOCARDIOGRAM  02/13/2016  ? LVEF 55-60%. Gr 1 DD. ? possible bicuspid AoV w/ moderate thickened / calcified AoV w/ mild Stenosis (Mean Gradietn 15 mmHg, Valve area 1.34cm^2), no regurg.    ? TRANSTHORACIC ECHOCARDIOGRAM  06/2018  ? a) EF 55-65%. Gr 1 DD. Mild AS (? Functionally bicuspid AoV w/ mod leaflet thickening) - Mean Grad 15 mmHg, Peak 32 mmHg; b) EF 65 to 70% with dynamic mid cavity gradient due to hyperdynamic LV (peak 28 mmHg-at rest).  Moderate LVH.  Mod AS, Mean grad 27 mmHg  ? TRANSTHORACIC ECHOCARDIOGRAM  06/26/2021  ? EF> 75%.  Hyperdynamic function.  Moderate LVH.  Indeterminate filling pressures.  Normal RV.  Moderate aortic valve calcification.  Mean gradient 21 mmHg.  (Mild to Moderate AS)-previous mean gradient was 27 mmHg.  ? TUBAL LIGATION Bilateral yrs ago  ? ? ?There were no vitals filed for this visit. ? ? Subjective Assessment - 04/26/22 1035   ? ? Subjective Patient reports that her back is hurting a little today. However, she feels that this was caused by having to ride a lot in the car yesterday and not able to do her HEP. She notes that her HEP has been helping get rid of her pain. She feels that she is 50% better than she was prior to therapy.   ? Pertinent History CTR, DM, COPD, GERD, HTN.   ? How long can you sit comfortably? No problem.   ? How long can you stand comfortably? Varies.   ? How long can you walk comfortably? Short community distances.  Recommend she use a cane.   ? Patient Stated Goals Get out of pain and be able to do more.   ? Currently in Pain? Yes   ? Pain Score 5    ? Pain Location Back   ? Pain Onset More than a month ago   ? ?  ?  ? ?  ? ? ? ? ? [facility] PT Assessment - 04/26/22 0001   ? ?  ? Observation/Other Assessments  ? Focus on Therapeutic Outcomes (FOTO)  63.76   ? ?  ?  ? ?  ? ? ? ? ? ? ? ? ? ? ? ? ? ? ? ? [place]  Adult PT Treatment/Exercise - 04/26/22 0001   ? ?  ? Lumbar Exercises: Aerobic  ? Nustep L4 x 20 minutes   ?  ? Modalities  ? Modalities Traction   ?  ? Electrical Stimulation  ? Electrical Stimulation Location Bil LB   no redness or adverse reaction to today's modalities  ? Electrical Stimulation Action IFC   ? Electrical Stimulation Parameters 80-150 Hz '@40'$ % scan x 10 minutes   ? Electrical Stimulation Goals Pain;Tone   ?  ? Traction  ? Type of Traction Lumbar   ? Min (lbs)  15   ? Max (lbs) 90   ? Hold Time 99   ? Rest Time 5   ? Time 15   ? ?  ?  ? ?  ? ? ? ? ? ? ? ? ? ? ? ? ? ? ? PT Long Term Goals - 04/26/22 1038   ? ?  ? PT LONG TERM GOAL #1  ? Title Independent with an HEP.   ? Time 6   ? Period Weeks   ? Status Achieved   ?  ? PT LONG TERM GOAL #2  ? Title Eliminate right thigh numbness.   ? Baseline "not as bad as it used to be" and "it is not waking me up at night"   ? Time 6   ? Period Weeks   ? Status On-going   ?  ? PT LONG TERM GOAL #3  ? Title Stand in erect posture.   ? Time 6   ? Period Weeks   ? Status On-going   ?  ? PT LONG TERM GOAL #4  ? Title Perform ADL's with pain not > 3-4/10.   ? Baseline most days, but pain can get over 4/10 after busy days   ? Time 6   ? Period Weeks   ? Status On-going   ? ?  ?  ? ?  ? ? ? ? ? ? ? ? Plan - 04/26/22 1038   ? ? Clinical Impression Statement Patient is making fair progress with skilled physical therapy as evidenced by her subjective reports and progress toward her goals. She was progressed with familair interventions with no pain or discomfort. She reported that her back felt better upon the conclusion of treatment. She would continue to benefit from skilled physical therapy to address her remaining impairments to maximize her functional mobility.   ? Personal Factors and Comorbidities Comorbidity 1;Other   ? Comorbidities CTR, DM, COPD, GERD, HTN.   ? Examination-Activity Limitations Bed Mobility;Transfers;Locomotion Level;Other   ?  Examination-Participation Restrictions Other;Meal Prep   ? Stability/Clinical Decision Making Evolving/Moderate complexity   ? Rehab Potential Good   ? PT Frequency 1x / week   ? PT Duration 4 weeks   ? PT Treatment/Intervention

## 2022-04-27 NOTE — Patient Outreach (Signed)
Received a Health Coach referral notification for Ms. Yontz. ?I have assigned [name], RN to call for follow up and determine if there are any Case Management needs.  ?  ?Arville Care, CBCS, CMAA ?[place] Management Assistant ?[facility] ?[phone]    ?

## 2022-04-30 ENCOUNTER — Other Ambulatory Visit: Payer: Self-pay | Admitting: *Deleted

## 2022-04-30 NOTE — Patient Outreach (Signed)
[place] [facility]) Care Management ? ?04/30/2022 ? ?[name] [name] ?1941/01/18 ?[id number] ? ?Unsuccessful outreach attempt made to patient. An individual answered the phone and explained that the patient was not at home. RN Health Coach briefly discussed [facility] program with this individual who wrote this nurse's contact information down. The individual stated she would ask the patient to callback. ? ?Plan: ?RN Health Coach will call patient within the next several business days and will send patient an unsuccessful letter.  ? ?[name] RN, BSN ?[facility] Care Management  ?RN Health Coach ?[phone] ?[name] ? ? ?

## 2022-05-01 ENCOUNTER — Ambulatory Visit: Payer: Medicare Other | Admitting: *Deleted

## 2022-05-02 ENCOUNTER — Other Ambulatory Visit: Payer: Self-pay | Admitting: *Deleted

## 2022-05-02 NOTE — Patient Outreach (Signed)
[facility]) Care Management ? ?05/02/2022 ? ?Ymani T Parker ?12-Dec-1941 ?[id number] ? ?Telephone out reach call to patient to discuss [facility] program. Patient explained that she would be interested in the program but would like to first wait to receive the [facility] pamphlet and letter this nurse sent her previously before she engages in any further conversation to ensure the [facility] program is legitimate. Nurse asked permission to call the patient back next week and it was agreed this nurse would call the patient back next Wednesday.  ? ?Plan: ?RN Health Coach will call patient 05/09/22. ? ?[name] RN, BSN ?[facility] Care Management  ?RN Health Coach ?[phone] ?[email or web address] ? ? ?

## 2022-05-03 ENCOUNTER — Ambulatory Visit: Payer: Medicare Other | Attending: Internal Medicine

## 2022-05-03 DIAGNOSIS — R293 Abnormal posture: Secondary | ICD-10-CM | POA: Diagnosis not present

## 2022-05-03 DIAGNOSIS — M5441 Lumbago with sciatica, right side: Secondary | ICD-10-CM | POA: Diagnosis not present

## 2022-05-03 DIAGNOSIS — G8929 Other chronic pain: Secondary | ICD-10-CM | POA: Insufficient documentation

## 2022-05-03 NOTE — Therapy (Signed)
[place] [facility] ?[place] ?[place], [place], [address] ?Phone: [phone]   Fax:  [phone] ? ?Physical Therapy Treatment ? ?Patient Details  ?Name: [name] [name] ?MRN: [id number] ?Date of Birth: 08-Mar-1941 ?Referring Provider (PT): Marton Redwood MD. ? ? ?Encounter Date: 05/03/2022 ? ? PT End of Session - 05/03/22 1304   ? ? Visit Number 13   ? Number of Visits 16   ? Date for PT Re-Evaluation 06/08/22   ? Authorization Type FOTO AT LEAST EVERY 5TH VISIT.  PROGRESS NOTE AT 10TH VISIT.  KX MODIFIER AFTER 15 VISITS.   ? PT Start Time 1300   ? PT Stop Time 9509   ? PT Time Calculation (min) 49 min   ? Activity Tolerance Patient tolerated treatment well   ? Behavior During Therapy [facility] for tasks assessed/performed   ? ?  ?  ? ?  ? ? ?Past Medical History:  ?Diagnosis Date  ? Aortic valve stenosis, mild 02/2016  ? Echo February 2017: possible bicuspid w/ moderate thickened and calicified, AVA ~3.26 cm? -no symptoms; June 2022: Hyperdynamic EF 75%.  Mild to moderate AS-mean gradient 21 mmHg  ? COPD (chronic obstructive pulmonary disease) ([place])   ? GERD (gastroesophageal reflux disease)   ? no problems since started on CPAP  ? Hematuria   ? History of adenomatous polyp of colon   ? tubular adnenoma's and hyperplastic polyp's 05-19-2002;  08-15-2010;  2015  ? History of kidney stones   ? multiple since 1970's  ? History of squamous cell carcinoma excision   ? 05-14-2012  right arm;  04-18-2015  left ankle  ? HTN (hypertension)   ? Hyperlipidemia   ? Iron deficiency anemia   ? Left ureteral stone   ? OSA on CPAP   ? per study 10-13-2006 mild to moderate osa w/ hypersomnia  ? Seasonal and perennial allergic rhinitis   ? Type 2 diabetes mellitus ([place])   ? type 2  ? Urgency of urination   ? ? ?Past Surgical History:  ?Procedure Laterality Date  ? BIOPSY  03/29/2020  ? Procedure: BIOPSY;  Surgeon: Clarene Essex, MD;  Location: [facility];  Service: Endoscopy;;  ? CARPAL TUNNEL  RELEASE Bilateral 2003  ? COLONOSCOPY  last one 2015  ? CT CHEST WITH CONTRAST   ([place] HX)    ? Advanced aortic and branch vessel atherosclerosis. Tortuous thoracic aorta. Normal heart size, without pericardial effusion. Multivessel coronary artery atherosclerosis  ? CYSTOSCOPY WITH RETROGRADE PYELOGRAM, URETEROSCOPY AND STENT PLACEMENT Left 04/15/2017  ? Procedure: CYSTOSCOPY WITH LEFT  RETROGRADE PYELOGRAM, URETEROSCOPYwith stone basketry;  Surgeon: Kathie Rhodes, MD;  Location: [facility];  Service: Urology;  Laterality: Left;  ? D & C HYSTEROSCOPY W/ RESECTION FIBROID AND POLYP  07/30/2000  ? DOBUTAMINE STRESS ECHO  01-30-2008   [facility]  ? no ischemia/  normal wall motion/  post stress ef 79%  ? ESOPHAGOGASTRODUODENOSCOPY (EGD) WITH PROPOFOL N/A 01/28/2018  ? Procedure: ESOPHAGOGASTRODUODENOSCOPY (EGD) WITH PROPOFOL;  Surgeon: Clarene Essex, MD;  [facility]: [facility];  Service: Endoscopy;  Laterality: N/A;  ? ESOPHAGOGASTRODUODENOSCOPY (EGD) WITH PROPOFOL N/A 08/19/2019  ? Procedure: ESOPHAGOGASTRODUODENOSCOPY (EGD) WITH PROPOFOL;  Surgeon: Clarene Essex, MD;  [facility]: [facility];  Service: Endoscopy;  Laterality: N/A;  ? ESOPHAGOGASTRODUODENOSCOPY (EGD) WITH PROPOFOL N/A 03/29/2020  ? Procedure: ESOPHAGOGASTRODUODENOSCOPY (EGD) WITH PROPOFOL;  Surgeon: Clarene Essex, MD;  [facility]: [facility];  Service: Endoscopy;  Laterality: N/A;  ? [place]  ? trauma   ?  HEMOSTASIS CLIP PLACEMENT  08/19/2019  ? Procedure: HEMOSTASIS CLIP PLACEMENT;  Surgeon: Clarene Essex, MD;  [facility]: [facility];  Service: Endoscopy;;  ? HOT HEMOSTASIS N/A 01/28/2018  ? Procedure: HOT HEMOSTASIS (ARGON PLASMA COAGULATION/BICAP);  Surgeon: Clarene Essex, MD;  Location: [facility];  Service: Endoscopy;  Laterality: N/A;  ? LAPAROSCOPIC CHOLECYSTECTOMY  08/20/2001  ? w/ ERCP spincterotomy w/ balloon  ? NM MYOVIEW LTD  7/'19; 6/'21  ? LOW RISK.  EF > 65%. No Ischemia or Infarction. Normal Study: Hyperdynamic.   ? POLYPECTOMY  08/19/2019  ? Procedure: POLYPECTOMY;  Surgeon: Clarene Essex, MD;  [facility]: [facility];  Service: Endoscopy;;  ? TRANSTHORACIC ECHOCARDIOGRAM  02/13/2016  ? LVEF 55-60%. Gr 1 DD. ? possible bicuspid AoV w/ moderate thickened / calcified AoV w/ mild Stenosis (Mean Gradietn 15 mmHg, Valve area 1.34cm^2), no regurg.    ? TRANSTHORACIC ECHOCARDIOGRAM  06/2018  ? a) EF 55-65%. Gr 1 DD. Mild AS (? Functionally bicuspid AoV w/ mod leaflet thickening) - Mean Grad 15 mmHg, Peak 32 mmHg; b) EF 65 to 70% with dynamic mid cavity gradient due to hyperdynamic LV (peak 28 mmHg-at rest).  Moderate LVH.  Mod AS, Mean grad 27 mmHg  ? TRANSTHORACIC ECHOCARDIOGRAM  06/26/2021  ? EF> 75%.  Hyperdynamic function.  Moderate LVH.  Indeterminate filling pressures.  Normal RV.  Moderate aortic valve calcification.  Mean gradient 21 mmHg.  (Mild to Moderate AS)-previous mean gradient was 27 mmHg.  ? TUBAL LIGATION Bilateral yrs ago  ? ? ?There were no vitals filed for this visit. ? ? Subjective Assessment - 05/03/22 1304   ? ? Subjective Pt arrives for today's treatment session denying any pain.  Pt states that she had to cancel earlier this week due to a stomach bug   ? Pertinent History CTR, DM, COPD, GERD, HTN.   ? How long can you sit comfortably? No problem.   ? How long can you stand comfortably? Varies.   ? How long can you walk comfortably? Short community distances.  Recommend she use a cane.   ? Patient Stated Goals Get out of pain and be able to do more.   ? Pain Onset More than a month ago   ? ?  ?  ? ?  ? ? ? ? ? ? ? ? ? ? ? ? ? ? ? ? ? ? ? ? [place] Adult PT Treatment/Exercise - 05/03/22 0001   ? ?  ? Lumbar Exercises: Stretches  ? Single Knee to Chest Stretch 5 reps;10 seconds   Bilaterally  ? Lower Trunk Rotation 5 reps;10 seconds   Bilaterally  ?  ? Lumbar Exercises: Aerobic  ? Nustep Lvl 4 x 20 mins   ?  ? Modalities  ? Modalities Electrical Stimulation;Moist Heat   ?  ? Moist Heat Therapy  ? Number Minutes  Moist Heat 20 Minutes   ? Moist Heat Location Lumbar Spine   ?  ? Electrical Stimulation  ? Electrical Stimulation Location Bil LB   ? Electrical Stimulation Action IFC   ? Electrical Stimulation Parameters 80-150 Hz x 20 mins   ? Electrical Stimulation Goals Pain;Tone   ? ?  ?  ? ?  ? ? ? ? ? ? ? ? ? ? ? ? ? ? ? PT Long Term Goals - 04/26/22 1038   ? ?  ? PT LONG TERM GOAL #1  ? Title Independent with an HEP.   ? Time 6   ? Period  Weeks   ? Status Achieved   ?  ? PT LONG TERM GOAL #2  ? Title Eliminate right thigh numbness.   ? Baseline "not as bad as it used to be" and "it is not waking me up at night"   ? Time 6   ? Period Weeks   ? Status On-going   ?  ? PT LONG TERM GOAL #3  ? Title Stand in erect posture.   ? Time 6   ? Period Weeks   ? Status On-going   ?  ? PT LONG TERM GOAL #4  ? Title Perform ADL's with pain not > 3-4/10.   ? Baseline most days, but pain can get over 4/10 after busy days   ? Time 6   ? Period Weeks   ? Status On-going   ? ?  ?  ? ?  ? ? ? ? ? ? ? ? Plan - 05/03/22 1305   ? ? Clinical Impression Statement Pt arrives for today's treatment session denying any pain.  Pt states that she had to cancel earlier this week due to having a stomach bug and is still not feeling very well.  Pt reports only wanting to perform Nustep, light stretches, and estim today due to stomach discomfort.  Normal responses to estim noted upon removal.  Pt denied any pain at completion of todya's treatment session.   ? Personal Factors and Comorbidities Comorbidity 1;Other   ? Comorbidities CTR, DM, COPD, GERD, HTN.   ? Examination-Activity Limitations Bed Mobility;Transfers;Locomotion Level;Other   ? Examination-Participation Restrictions Other;Meal Prep   ? Stability/Clinical Decision Making Evolving/Moderate complexity   ? Rehab Potential Good   ? PT Frequency 1x / week   ? PT Duration 4 weeks   ? PT Treatment/Interventions ADLs/Self Care Home Management;Cryotherapy;Electrical Stimulation;Ultrasound;Traction;Moist  Heat;Functional mobility training;Therapeutic activities;Therapeutic exercise;Manual techniques;Patient/family education;Passive range of motion;Dry needling   ? PT Next Visit Plan Traction to 90#.   ? PT Ho

## 2022-05-07 DIAGNOSIS — R82998 Other abnormal findings in urine: Secondary | ICD-10-CM | POA: Diagnosis not present

## 2022-05-08 ENCOUNTER — Ambulatory Visit: Payer: Medicare Other | Admitting: *Deleted

## 2022-05-08 DIAGNOSIS — G8929 Other chronic pain: Secondary | ICD-10-CM

## 2022-05-08 DIAGNOSIS — M5441 Lumbago with sciatica, right side: Secondary | ICD-10-CM | POA: Diagnosis not present

## 2022-05-08 DIAGNOSIS — R293 Abnormal posture: Secondary | ICD-10-CM

## 2022-05-08 NOTE — Therapy (Signed)
[place] ?[facility] ?[place] ?[place], [place], [address] ?Phone: [phone]   Fax:  [phone] ? ?Physical Therapy Treatment ? ?Patient Details  ?Name: [name] [name] ?MRN: [id number] ?Date of Birth: 03/07/1941 ?Referring Provider (PT): Marton Redwood MD. ? ? ?Encounter Date: 05/08/2022 ? ? PT End of Session - 05/08/22 1415   ? ? Visit Number 14   ? Number of Visits 16   ? Date for PT Re-Evaluation 06/08/22   ? Authorization Type FOTO AT LEAST EVERY 5TH VISIT.  PROGRESS NOTE AT 10TH VISIT.  KX MODIFIER AFTER 15 VISITS.   ? PT Start Time 1345   ? PT Stop Time 1436   ? PT Time Calculation (min) 51 min   ? ?  ?  ? ?  ? ? ?Past Medical History:  ?Diagnosis Date  ? Aortic valve stenosis, mild 02/2016  ? Echo February 2017: possible bicuspid w/ moderate thickened and calicified, AVA ~2.35 cm? -no symptoms; June 2022: Hyperdynamic EF 75%.  Mild to moderate AS-mean gradient 21 mmHg  ? COPD (chronic obstructive pulmonary disease) ([place])   ? GERD (gastroesophageal reflux disease)   ? no problems since started on CPAP  ? Hematuria   ? History of adenomatous polyp of colon   ? tubular adnenoma's and hyperplastic polyp's 05-19-2002;  08-15-2010;  2015  ? History of kidney stones   ? multiple since 1970's  ? History of squamous cell carcinoma excision   ? 05-14-2012  right arm;  04-18-2015  left ankle  ? HTN (hypertension)   ? Hyperlipidemia   ? Iron deficiency anemia   ? Left ureteral stone   ? OSA on CPAP   ? per study 10-13-2006 mild to moderate osa w/ hypersomnia  ? Seasonal and perennial allergic rhinitis   ? Type 2 diabetes mellitus ([place])   ? type 2  ? Urgency of urination   ? ? ?Past Surgical History:  ?Procedure Laterality Date  ? BIOPSY  03/29/2020  ? Procedure: BIOPSY;  Surgeon: Clarene Essex, MD;  [facility];  Service: Endoscopy;;  ? CARPAL TUNNEL RELEASE Bilateral 2003  ? COLONOSCOPY  last one 2015  ? CT CHEST WITH CONTRAST   ([place] HX)    ? Advanced aortic and branch  vessel atherosclerosis. Tortuous thoracic aorta. Normal heart size, without pericardial effusion. Multivessel coronary artery atherosclerosis  ? CYSTOSCOPY WITH RETROGRADE PYELOGRAM, URETEROSCOPY AND STENT PLACEMENT Left 04/15/2017  ? Procedure: CYSTOSCOPY WITH LEFT  RETROGRADE PYELOGRAM, URETEROSCOPYwith stone basketry;  Surgeon: Kathie Rhodes, MD;  Location[facility];  Service: Urology;  Laterality: Left;  ? D & C HYSTEROSCOPY W/ RESECTION FIBROID AND POLYP  07/30/2000  ? DOBUTAMINE STRESS ECHO  01-30-2008   [facility]  ? no ischemia/  normal wall motion/  post stress ef 79%  ? ESOPHAGOGASTRODUODENOSCOPY (EGD) WITH PROPOFOL N/A 01/28/2018  ? Procedure: ESOPHAGOGASTRODUODENOSCOPY (EGD) WITH PROPOFOL;  Surgeon: Clarene Essex, MD;  [facility];  Service: Endoscopy;  Laterality: N/A;  ? ESOPHAGOGASTRODUODENOSCOPY (EGD) WITH PROPOFOL N/A 08/19/2019  ? Procedure: ESOPHAGOGASTRODUODENOSCOPY (EGD) WITH PROPOFOL;  Surgeon: Clarene Essex, MD;  [facility];  Service: Endoscopy;  Laterality: N/A;  ? ESOPHAGOGASTRODUODENOSCOPY (EGD) WITH PROPOFOL N/A 03/29/2020  ? Procedure: ESOPHAGOGASTRODUODENOSCOPY (EGD) WITH PROPOFOL;  Surgeon: Clarene Essex, MD;  [facility];  Service: Endoscopy;  Laterality: N/A;  ? [place]  ? trauma   ? HEMOSTASIS CLIP PLACEMENT  08/19/2019  ? Procedure: HEMOSTASIS CLIP PLACEMENT;  Surgeon: Clarene Essex, MD;  [facility];  Service: Endoscopy;;  ? HOT HEMOSTASIS N/A 01/28/2018  ? Procedure: HOT HEMOSTASIS (ARGON PLASMA COAGULATION/BICAP);  Surgeon: Clarene Essex, MD;  Location: [facility];  Service: Endoscopy;  Laterality: N/A;  ? LAPAROSCOPIC CHOLECYSTECTOMY  08/20/2001  ? w/ ERCP spincterotomy w/ balloon  ? NM MYOVIEW LTD  7/'19; 6/'21  ? LOW RISK.  EF > 65%. No Ischemia or Infarction. Normal Study: Hyperdynamic.  ? POLYPECTOMY  08/19/2019  ? Procedure: POLYPECTOMY;  Surgeon: Clarene Essex, MD;  [facility];  Service:  Endoscopy;;  ? TRANSTHORACIC ECHOCARDIOGRAM  02/13/2016  ? LVEF 55-60%. Gr 1 DD. ? possible bicuspid AoV w/ moderate thickened / calcified AoV w/ mild Stenosis (Mean Gradietn 15 mmHg, Valve area 1.34cm^2), no regurg.    ? TRANSTHORACIC ECHOCARDIOGRAM  06/2018  ? a) EF 55-65%. Gr 1 DD. Mild AS (? Functionally bicuspid AoV w/ mod leaflet thickening) - Mean Grad 15 mmHg, Peak 32 mmHg; b) EF 65 to 70% with dynamic mid cavity gradient due to hyperdynamic LV (peak 28 mmHg-at rest).  Moderate LVH.  Mod AS, Mean grad 27 mmHg  ? TRANSTHORACIC ECHOCARDIOGRAM  06/26/2021  ? EF> 75%.  Hyperdynamic function.  Moderate LVH.  Indeterminate filling pressures.  Normal RV.  Moderate aortic valve calcification.  Mean gradient 21 mmHg.  (Mild to Moderate AS)-previous mean gradient was 27 mmHg.  ? TUBAL LIGATION Bilateral yrs ago  ? ? ?There were no vitals filed for this visit. ? ? ? ? ? ? ? ? ? ? ? ? ? ? ? ? ? ? ? ? ? [place] Adult PT Treatment/Exercise - 05/08/22 0001   ? ?  ? Lumbar Exercises: Aerobic  ? Nustep Lvl 4 x 15 mins   ?  ? Modalities  ? Modalities Electrical Stimulation;Moist Heat   ?  ? Moist Heat Therapy  ? Number Minutes Moist Heat 15 Minutes   ? Moist Heat Location Lumbar Spine   ?  ? Electrical Stimulation  ? Electrical Stimulation Location Bil LB   ? Electrical Stimulation Action IFC x 15 mins   ? Electrical Stimulation Parameters 80-'[id number]'$    ? Electrical Stimulation Goals Pain;Tone   ?  ? Traction  ? Type of Traction Lumbar   ? Min (lbs) 15   ? Max (lbs) 83   ? Hold Time 99   ? Rest Time 5   ? Time 15   ? ?  ?  ? ?  ? ? ? ? ? ? ? ? ? ? ? ? ? ? ? PT Long Term Goals - 04/26/22 1038   ? ?  ? PT LONG TERM GOAL #1  ? Title Independent with an HEP.   ? Time 6   ? Period Weeks   ? Status Achieved   ?  ? PT LONG TERM GOAL #2  ? Title Eliminate right thigh numbness.   ? Baseline "not as bad as it used to be" and "it is not waking me up at night"   ? Time 6   ? Period Weeks   ? Status On-going   ?  ? PT LONG TERM GOAL #3  ? Title  Stand in erect posture.   ? Time 6   ? Period Weeks   ? Status On-going   ?  ? PT LONG TERM GOAL #4  ? Title Perform ADL's with pain not > 3-4/10.   ? Baseline most days, but pain can get over 4/10 after busy days   ?  Time 6   ? Period Weeks   ? Status On-going   ? ?  ?  ? ?  ? ? ? ? ? ? ? ? Plan - 05/08/22 1416   ? ? Clinical Impression Statement Pt arrived today doing fairly well and reports passing a kidney stone that was causing some of her LBP. She did well with exs today and wanted to resume traction. Traction was performed at 83#s and tolerated well.   ? Personal Factors and Comorbidities Comorbidity 1;Other   ? Comorbidities CTR, DM, COPD, GERD, HTN.   ? Examination-Activity Limitations Bed Mobility;Transfers;Locomotion Level;Other   ? Examination-Participation Restrictions Other;Meal Prep   ? Stability/Clinical Decision Making Evolving/Moderate complexity   ? Rehab Potential Good   ? PT Frequency 1x / week   ? PT Treatment/Interventions ADLs/Self Care Home Management;Cryotherapy;Electrical Stimulation;Ultrasound;Traction;Moist Heat;Functional mobility training;Therapeutic activities;Therapeutic exercise;Manual techniques;Patient/family education;Passive range of motion;Dry needling   ? PT Next Visit Plan Traction to 90#.   ? Consulted and Agree with Plan of Care Patient   ? ?  ?  ? ?  ? ? ?Patient will benefit from skilled therapeutic intervention in order to improve the following deficits and impairments:  Abnormal gait, Increased muscle spasms, Decreased activity tolerance, Decreased range of motion, Postural dysfunction, Pain ? ?Visit Diagnosis: ?Chronic right-sided low back pain with right-sided sciatica ? ?Abnormal posture ? ? ? ? ?Problem List ?Patient Active Problem List  ? Diagnosis Date Noted  ? Multiple lung nodules on CT 09/20/2019  ? COPD mixed type ([place]) 09/16/2018  ? Coronary artery calcification seen on computed tomography 06/30/2018  ? Precordial pain 06/30/2018  ? Moderate aortic stenosis  by prior echocardiogram 06/30/2018  ? Anemia 01/17/2017  ? Depression 01/17/2017  ? Thyroid nodule 01/17/2017  ? Kidney stone 01/17/2017  ? Osteopenia 01/17/2017  ? Hyperlipidemia associated with type 2 dia

## 2022-05-09 ENCOUNTER — Other Ambulatory Visit: Payer: Self-pay | Admitting: *Deleted

## 2022-05-09 NOTE — Patient Outreach (Signed)
[facility]) Care Management ? ?05/09/2022 ? ?[name] [name] ?[birth year] ?[id number] ? ?Unsuccessful outreach attempt made to patient. Patient answered the phone and stated that she would not be able to speak today. Patient did request that this nurse call back 05/14/22.  ? ?Plan: ?RN Health Coach will call patient on 05/14/22. ? ?[name] RN, BSN ?[facility] Care Management  ?RN Health Coach ?[phone] ?[email or web address] ? ? ?

## 2022-05-10 ENCOUNTER — Ambulatory Visit: Payer: Medicare Other | Admitting: Physical Therapy

## 2022-05-10 DIAGNOSIS — G8929 Other chronic pain: Secondary | ICD-10-CM | POA: Diagnosis not present

## 2022-05-10 DIAGNOSIS — R293 Abnormal posture: Secondary | ICD-10-CM | POA: Diagnosis not present

## 2022-05-10 DIAGNOSIS — M5441 Lumbago with sciatica, right side: Secondary | ICD-10-CM | POA: Diagnosis not present

## 2022-05-10 NOTE — Therapy (Signed)
[place] ?[facility] ?[place] ?[place], [place], [address] ?Phone: [phone]   Fax:  [phone] ? ?Physical Therapy Treatment ? ?Patient Details  ?Name: [name] [name] ?MRN: [id number] ?Date of Birth: 1941-02-13 ?Referring Provider (PT): Marton Redwood MD. ? ? ?Encounter Date: 05/10/2022 ? ? PT End of Session - 05/10/22 1212   ? ? Visit Number 15   ? Number of Visits 16   ? Date for PT Re-Evaluation 06/08/22   ? Authorization Type FOTO AT LEAST EVERY 5TH VISIT.  PROGRESS NOTE AT 10TH VISIT.  KX MODIFIER AFTER 15 VISITS.   ? PT Start Time 1121   ? PT Stop Time 1221   ? PT Time Calculation (min) 60 min   ? Activity Tolerance Patient tolerated treatment well   ? Behavior During Therapy [facility] for tasks assessed/performed   ? ?  ?  ? ?  ? ? ?Past Medical History:  ?Diagnosis Date  ? Aortic valve stenosis, mild 02/2016  ? Echo February 2017: possible bicuspid w/ moderate thickened and calicified, AVA ~4.16 cm? -no symptoms; June 2022: Hyperdynamic EF 75%.  Mild to moderate AS-mean gradient 21 mmHg  ? COPD (chronic obstructive pulmonary disease) ([place])   ? GERD (gastroesophageal reflux disease)   ? no problems since started on CPAP  ? Hematuria   ? History of adenomatous polyp of colon   ? tubular adnenoma's and hyperplastic polyp's 05-19-2002;  08-15-2010;  2015  ? History of kidney stones   ? multiple since 1970's  ? History of squamous cell carcinoma excision   ? 05-14-2012  right arm;  04-18-2015  left ankle  ? HTN (hypertension)   ? Hyperlipidemia   ? Iron deficiency anemia   ? Left ureteral stone   ? OSA on CPAP   ? per study 10-13-2006 mild to moderate osa w/ hypersomnia  ? Seasonal and perennial allergic rhinitis   ? Type 2 diabetes mellitus ([place])   ? type 2  ? Urgency of urination   ? ? ?Past Surgical History:  ?Procedure Laterality Date  ? BIOPSY  03/29/2020  ? Procedure: BIOPSY;  Surgeon: Clarene Essex, MD;  Location: [facility];  Service: Endoscopy;;  ? CARPAL TUNNEL  RELEASE Bilateral 2003  ? COLONOSCOPY  last one 2015  ? CT CHEST WITH CONTRAST   ([place] HX)    ? Advanced aortic and branch vessel atherosclerosis. Tortuous thoracic aorta. Normal heart size, without pericardial effusion. Multivessel coronary artery atherosclerosis  ? CYSTOSCOPY WITH RETROGRADE PYELOGRAM, URETEROSCOPY AND STENT PLACEMENT Left 04/15/2017  ? Procedure: CYSTOSCOPY WITH LEFT  RETROGRADE PYELOGRAM, URETEROSCOPYwith stone basketry;  Surgeon: Kathie Rhodes, MD;  Location: [facility];  Service: Urology;  Laterality: Left;  ? D & C HYSTEROSCOPY W/ RESECTION FIBROID AND POLYP  07/30/2000  ? DOBUTAMINE STRESS ECHO  01-30-2008   [facility]  ? no ischemia/  normal wall motion/  post stress ef 79%  ? ESOPHAGOGASTRODUODENOSCOPY (EGD) WITH PROPOFOL N/A 01/28/2018  ? Procedure: ESOPHAGOGASTRODUODENOSCOPY (EGD) WITH PROPOFOL;  Surgeon: Clarene Essex, MD;  [facility]: [facility];  Service: Endoscopy;  Laterality: N/A;  ? ESOPHAGOGASTRODUODENOSCOPY (EGD) WITH PROPOFOL N/A 08/19/2019  ? Procedure: ESOPHAGOGASTRODUODENOSCOPY (EGD) WITH PROPOFOL;  Surgeon: Clarene Essex, MD;  [facility];  Service: Endoscopy;  Laterality: N/A;  ? ESOPHAGOGASTRODUODENOSCOPY (EGD) WITH PROPOFOL N/A 03/29/2020  ? Procedure: ESOPHAGOGASTRODUODENOSCOPY (EGD) WITH PROPOFOL;  Surgeon: Clarene Essex, MD;  [facility]: [facility];  Service: Endoscopy;  Laterality: N/A;  ? [place]  ? trauma   ?  HEMOSTASIS CLIP PLACEMENT  08/19/2019  ? Procedure: HEMOSTASIS CLIP PLACEMENT;  Surgeon: Clarene Essex, MD;  Location: [facility];  Service: Endoscopy;;  ? HOT HEMOSTASIS N/A 01/28/2018  ? Procedure: HOT HEMOSTASIS (ARGON PLASMA COAGULATION/BICAP);  Surgeon: Clarene Essex, MD;  Location: [facility];  Service: Endoscopy;  Laterality: N/A;  ? LAPAROSCOPIC CHOLECYSTECTOMY  08/20/2001  ? w/ ERCP spincterotomy w/ balloon  ? NM MYOVIEW LTD  7/'19; 6/'21  ? LOW RISK.  EF > 65%. No Ischemia or Infarction. Normal Study: Hyperdynamic.   ? POLYPECTOMY  08/19/2019  ? Procedure: POLYPECTOMY;  Surgeon: Clarene Essex, MD;  [facility]: [facility];  Service: Endoscopy;;  ? TRANSTHORACIC ECHOCARDIOGRAM  02/13/2016  ? LVEF 55-60%. Gr 1 DD. ? possible bicuspid AoV w/ moderate thickened / calcified AoV w/ mild Stenosis (Mean Gradietn 15 mmHg, Valve area 1.34cm^2), no regurg.    ? TRANSTHORACIC ECHOCARDIOGRAM  06/2018  ? a) EF 55-65%. Gr 1 DD. Mild AS (? Functionally bicuspid AoV w/ mod leaflet thickening) - Mean Grad 15 mmHg, Peak 32 mmHg; b) EF 65 to 70% with dynamic mid cavity gradient due to hyperdynamic LV (peak 28 mmHg-at rest).  Moderate LVH.  Mod AS, Mean grad 27 mmHg  ? TRANSTHORACIC ECHOCARDIOGRAM  06/26/2021  ? EF> 75%.  Hyperdynamic function.  Moderate LVH.  Indeterminate filling pressures.  Normal RV.  Moderate aortic valve calcification.  Mean gradient 21 mmHg.  (Mild to Moderate AS)-previous mean gradient was 27 mmHg.  ? TUBAL LIGATION Bilateral yrs ago  ? ? ?There were no vitals filed for this visit. ? ? Subjective Assessment - 05/10/22 1206   ? ? Subjective I'm 75% better.   ? Pertinent History CTR, DM, COPD, GERD, HTN.   ? How long can you sit comfortably? No problem.   ? How long can you stand comfortably? Varies.   ? How long can you walk comfortably? Short community distances.  Recommend she use a cane.   ? Patient Stated Goals Get out of pain and be able to do more.   ? Currently in Pain? Yes   ? Pain Score 4    ? Pain Location Back   ? Pain Orientation Lower   ? Pain Descriptors / Indicators Discomfort   ? Pain Type Chronic pain   ? Pain Onset More than a month ago   ? ?  ?  ? ?  ? ? ? ? ? ? ? ? ? ? ? ? ? ? ? ? ? ? ? ? [place] Adult PT Treatment/Exercise - 05/10/22 0001   ? ?  ? Exercises  ? Exercises Knee/Hip   ?  ? Lumbar Exercises: Aerobic  ? Nustep Level 4 x 15 minutes.   ?  ? Modalities  ? Modalities Electrical Stimulation;Moist Heat   ?  ? Moist Heat Therapy  ? Number Minutes Moist Heat 20 Minutes   ? Moist Heat Location Lumbar Spine    ?  ? Electrical Stimulation  ? Electrical Stimulation Location Bilateral LB   ? Electrical Stimulation Action IFC x 20 minutes at 80-150 Hz.   ? Electrical Stimulation Parameters 40% scan.   ? Electrical Stimulation Goals Pain;Tone   ?  ? Traction  ? Type of Traction Lumbar   ? Min (lbs) 15   ? Max (lbs) 83   ? Hold Time 99   ? Rest Time 5   ? Time 15   ? ?  ?  ? ?  ? ? ? ? ? ? ? ? ? ? ? ? ? ? ?  PT Long Term Goals - 04/26/22 1038   ? ?  ? PT LONG TERM GOAL #1  ? Title Independent with an HEP.   ? Time 6   ? Period Weeks   ? Status Achieved   ?  ? PT LONG TERM GOAL #2  ? Title Eliminate right thigh numbness.   ? Baseline "not as bad as it used to be" and "it is not waking me up at night"   ? Time 6   ? Period Weeks   ? Status On-going   ?  ? PT LONG TERM GOAL #3  ? Title Stand in erect posture.   ? Time 6   ? Period Weeks   ? Status On-going   ?  ? PT LONG TERM GOAL #4  ? Title Perform ADL's with pain not > 3-4/10.   ? Baseline most days, but pain can get over 4/10 after busy days   ? Time 6   ? Period Weeks   ? Status On-going   ? ?  ?  ? ?  ? ? ? ? ? ? ? ? Plan - 05/10/22 1210   ? ? Clinical Impression Statement Patient very pleased with her progress and feels 75% better overall.  She is tolerating 83# of traction very well.   ? Personal Factors and Comorbidities Comorbidity 1;Other   ? Comorbidities CTR, DM, COPD, GERD, HTN.   ? Examination-Activity Limitations Bed Mobility;Transfers;Locomotion Level;Other   ? Examination-Participation Restrictions Other;Meal Prep   ? Stability/Clinical Decision Making Evolving/Moderate complexity   ? Clinical Decision Making Low   ? Rehab Potential Good   ? PT Frequency 1x / week   ? PT Duration 4 weeks   ? PT Treatment/Interventions ADLs/Self Care Home Management;Cryotherapy;Electrical Stimulation;Ultrasound;Traction;Moist Heat;Functional mobility training;Therapeutic activities;Therapeutic exercise;Manual techniques;Patient/family education;Passive range of motion;Dry needling    ? ?  ?  ? ?  ? ? ?Patient will benefit from skilled therapeutic intervention in order to improve the following deficits and impairments:  Abnormal gait, Increased muscle spasms, Decreased activity tole

## 2022-05-14 ENCOUNTER — Other Ambulatory Visit: Payer: Self-pay | Admitting: *Deleted

## 2022-05-14 NOTE — Patient Outreach (Addendum)
[facility]) Care Management ? ?05/14/2022 ? ?[name] [name] ?February 24, 1941 ?[id number] ? ?Unsuccessful outreach attempt made to patient. Patient answered the phone and stated that she would not be able to speak today. Patient did request that this nurse call her back.  ? ?Plan: ?RN Health Coach will call patient within the next several business days. ? ?[name] RN, BSN ?[facility] Care Management  ?RN Health Coach ?[phone] ?[email or web address] ? ? ? ?

## 2022-05-15 ENCOUNTER — Other Ambulatory Visit: Payer: Self-pay | Admitting: *Deleted

## 2022-05-15 ENCOUNTER — Ambulatory Visit: Payer: Medicare Other | Admitting: Physical Therapy

## 2022-05-15 DIAGNOSIS — R293 Abnormal posture: Secondary | ICD-10-CM | POA: Diagnosis not present

## 2022-05-15 DIAGNOSIS — M5441 Lumbago with sciatica, right side: Secondary | ICD-10-CM | POA: Diagnosis not present

## 2022-05-15 DIAGNOSIS — G8929 Other chronic pain: Secondary | ICD-10-CM | POA: Diagnosis not present

## 2022-05-15 NOTE — Therapy (Signed)
[place] ?[facility] ?[place] ?[place], [place], [address] ?Phone: [phone]   Fax:  [phone] ? ?Physical Therapy Evaluation ? ?Patient Details  ?Name: [name] [name] ?MRN: [id number] ?Date of Birth: 10-Jul-1941 ?Referring Provider (PT): Marton Redwood MD. ? ? ?Encounter Date: 05/15/2022 ? ? PT End of Session - 05/15/22 1306   ? ? Visit Number 16   ? Number of Visits 16   ? Date for PT Re-Evaluation 06/08/22   ? Authorization Type FOTO AT LEAST EVERY 5TH VISIT.  PROGRESS NOTE AT 10TH VISIT.  KX MODIFIER AFTER 15 VISITS.   ? PT Start Time 1117   ? PT Stop Time 1209   ? PT Time Calculation (min) 52 min   ? Activity Tolerance Patient tolerated treatment well   ? Behavior During Therapy [facility] for tasks assessed/performed   ? ?  ?  ? ?  ? ? ?Past Medical History:  ?Diagnosis Date  ? Aortic valve stenosis, mild 02/2016  ? Echo February 2017: possible bicuspid w/ moderate thickened and calicified, AVA ~7.09 cm? -no symptoms; June 2022: Hyperdynamic EF 75%.  Mild to moderate AS-mean gradient 21 mmHg  ? COPD (chronic obstructive pulmonary disease) ([place])   ? GERD (gastroesophageal reflux disease)   ? no problems since started on CPAP  ? Hematuria   ? History of adenomatous polyp of colon   ? tubular adnenoma's and hyperplastic polyp's 05-19-2002;  08-15-2010;  2015  ? History of kidney stones   ? multiple since 1970's  ? History of squamous cell carcinoma excision   ? 05-14-2012  right arm;  04-18-2015  left ankle  ? HTN (hypertension)   ? Hyperlipidemia   ? Iron deficiency anemia   ? Left ureteral stone   ? OSA on CPAP   ? per study 10-13-2006 mild to moderate osa w/ hypersomnia  ? Seasonal and perennial allergic rhinitis   ? Type 2 diabetes mellitus ([place])   ? type 2  ? Urgency of urination   ? ? ?Past Surgical History:  ?Procedure Laterality Date  ? BIOPSY  03/29/2020  ? Procedure: BIOPSY;  Surgeon: Clarene Essex, MD;  [facility]: [facility];  Service: Endoscopy;;  ? CARPAL TUNNEL  RELEASE Bilateral 2003  ? COLONOSCOPY  last one 2015  ? CT CHEST WITH CONTRAST   ([place] HX)    ? Advanced aortic and branch vessel atherosclerosis. Tortuous thoracic aorta. Normal heart size, without pericardial effusion. Multivessel coronary artery atherosclerosis  ? CYSTOSCOPY WITH RETROGRADE PYELOGRAM, URETEROSCOPY AND STENT PLACEMENT Left 04/15/2017  ? Procedure: CYSTOSCOPY WITH LEFT  RETROGRADE PYELOGRAM, URETEROSCOPYwith stone basketry;  Surgeon: Kathie Rhodes, MD;  Location: [facility];  Service: Urology;  Laterality: Left;  ? D & C HYSTEROSCOPY W/ RESECTION FIBROID AND POLYP  07/30/2000  ? DOBUTAMINE STRESS ECHO  01-30-2008   [facility]  ? no ischemia/  normal wall motion/  post stress ef 79%  ? ESOPHAGOGASTRODUODENOSCOPY (EGD) WITH PROPOFOL N/A 01/28/2018  ? Procedure: ESOPHAGOGASTRODUODENOSCOPY (EGD) WITH PROPOFOL;  Surgeon: Clarene Essex, MD;  [facility];  Service: Endoscopy;  Laterality: N/A;  ? ESOPHAGOGASTRODUODENOSCOPY (EGD) WITH PROPOFOL N/A 08/19/2019  ? Procedure: ESOPHAGOGASTRODUODENOSCOPY (EGD) WITH PROPOFOL;  Surgeon: Clarene Essex, MD;  [facility];  Service: Endoscopy;  Laterality: N/A;  ? ESOPHAGOGASTRODUODENOSCOPY (EGD) WITH PROPOFOL N/A 03/29/2020  ? Procedure: ESOPHAGOGASTRODUODENOSCOPY (EGD) WITH PROPOFOL;  Surgeon: Clarene Essex, MD;  [facility];  Service: Endoscopy;  Laterality: N/A;  ? [place]  ? trauma   ?  HEMOSTASIS CLIP PLACEMENT  08/19/2019  ? Procedure: HEMOSTASIS CLIP PLACEMENT;  Surgeon: Clarene Essex, MD;  [facility];  Service: Endoscopy;;  ? HOT HEMOSTASIS N/A 01/28/2018  ? Procedure: HOT HEMOSTASIS (ARGON PLASMA COAGULATION/BICAP);  Surgeon: Clarene Essex, MD;  Location: [facility];  Service: Endoscopy;  Laterality: N/A;  ? LAPAROSCOPIC CHOLECYSTECTOMY  08/20/2001  ? w/ ERCP spincterotomy w/ balloon  ? NM MYOVIEW LTD  7/'19; 6/'21  ? LOW RISK.  EF > 65%. No Ischemia or Infarction. Normal Study: Hyperdynamic.   ? POLYPECTOMY  08/19/2019  ? Procedure: POLYPECTOMY;  Surgeon: Clarene Essex, MD;  [facility]: [facility];  Service: Endoscopy;;  ? TRANSTHORACIC ECHOCARDIOGRAM  02/13/2016  ? LVEF 55-60%. Gr 1 DD. ? possible bicuspid AoV w/ moderate thickened / calcified AoV w/ mild Stenosis (Mean Gradietn 15 mmHg, Valve area 1.34cm^2), no regurg.    ? TRANSTHORACIC ECHOCARDIOGRAM  06/2018  ? a) EF 55-65%. Gr 1 DD. Mild AS (? Functionally bicuspid AoV w/ mod leaflet thickening) - Mean Grad 15 mmHg, Peak 32 mmHg; b) EF 65 to 70% with dynamic mid cavity gradient due to hyperdynamic LV (peak 28 mmHg-at rest).  Moderate LVH.  Mod AS, Mean grad 27 mmHg  ? TRANSTHORACIC ECHOCARDIOGRAM  06/26/2021  ? EF> 75%.  Hyperdynamic function.  Moderate LVH.  Indeterminate filling pressures.  Normal RV.  Moderate aortic valve calcification.  Mean gradient 21 mmHg.  (Mild to Moderate AS)-previous mean gradient was 27 mmHg.  ? TUBAL LIGATION Bilateral yrs ago  ? ? ?There were no vitals filed for this visit. ? ? ? Subjective Assessment - 05/15/22 1306   ? ? Subjective Patient requesting to skip traction today.  Doing well and pleased with progress.   ? Pertinent History CTR, DM, COPD, GERD, HTN.   ? How long can you sit comfortably? No problem.   ? How long can you stand comfortably? Varies.   ? How long can you walk comfortably? Short community distances.  Recommend she use a cane.   ? Patient Stated Goals Get out of pain and be able to do more.   ? Currently in Pain? Yes   ? Pain Location Back   ? Pain Orientation Lower   ? Pain Type Chronic pain   ? Pain Onset More than a month ago   ? ?  ?  ? ?  ? ? ? ? ? ? ? ? ? ? ? ? ? ? ? ? ? ?Objective measurements completed on examination: See above findings.  ? ? ? ? ? [place] Adult PT Treatment/Exercise - 05/15/22 0001   ? ?  ? Exercises  ? Exercises Knee/Hip   ?  ? Lumbar Exercises: Aerobic  ? Nustep Level 4 x 15 minutes.   ?  ? Modalities  ? Modalities Electrical Stimulation;Moist Heat   ?  ? Moist Heat  Therapy  ? Number Minutes Moist Heat 30 Minutes   ? Moist Heat Location Lumbar Spine   Seated.  ?  ? Electrical Stimulation  ? Electrical Stimulation Location Bilateral low back.   ? Electrical Stimulation Action IFC x 30 minutes.   ? Electrical Stimulation Parameters 40% scan (normal modality response.  Patient uses heat a lot at home.  Cautioned and told to inspect skin to avoid burns.  Patient states daughter also warns her to not overuse.   ? Electrical Stimulation Goals Tone;Pain   ? ?  ?  ? ?  ? ? ? ? ? ? ? ? ? ? ? ? ? ? ?  PT Long Term Goals - 05/15/22 1210   ? ?  ? PT LONG TERM GOAL #1  ? Title Independent with an HEP.   ? Time 6   ? Period Weeks   ? Status Achieved   ?  ? PT LONG TERM GOAL #2  ? Title Eliminate right thigh numbness.   ? Baseline "not as bad as it used to be" and "it is not waking me up at night"   ? Time 6   ? Period Weeks   ? Status Partially Met   ?  ? PT LONG TERM GOAL #3  ? Title Stand in erect posture.   ? Time 6   ? Period Weeks   ? Status Partially Met   ?  ? PT LONG TERM GOAL #4  ? Title Perform ADL's with pain not > 3-4/10.   ? Time 6   ? Period Weeks   ? Status Partially Met   ? ?  ?  ? ?  ? ? ? ? ? ? ? ? ? Plan - 05/15/22 1309   ? ? Clinical Impression Statement Patient very pleased with her outcome from PT.  Please see discharge summary.   ? Personal Factors and Comorbidities Comorbidity 1;Other   ? Comorbidities CTR, DM, COPD, GERD, HTN.   ? Examination-Activity Limitations Bed Mobility;Transfers;Locomotion Level;Other   ? Examination-Participation Restrictions Other;Meal Prep   ? Stability/Clinical Decision Making Evolving/Moderate complexity   ? Rehab Potential Good   ? PT Frequency 1x / week   ? PT Duration 4 weeks   ? PT Treatment/Interventions ADLs/Self Care Home Management;Cryotherapy;Electrical Stimulation;Ultrasound;Traction;Moist Heat;Functional mobility training;Therapeutic activities;Therapeutic exercise;Manual techniques;Patient/family education;Passive range of  motion;Dry needling   ? PT Next Visit Plan D/c.   ? Consulted and Agree with Plan of Care Patient   ? ?  ?  ? ?  ? ? ?Patient will benefit from skilled therapeutic intervention in order to improve the following de

## 2022-05-15 NOTE — Patient Outreach (Signed)
[facility]) Care Management ? ?05/15/2022 ? ?Tane T Sonnen ?[birth year] ?[id number] ? ?Unsuccessful outreach attempt made to patient. RN Health Coach left HIPAA compliant voicemail message along with her contact information. ? ?Plan: ?RN Health Coach will call patient within the month of June. ? ?[name] RN, BSN ?[facility] Care Management  ?RN Health Coach ?[phone] ?[email or web address] ? ? ?

## 2022-05-16 ENCOUNTER — Ambulatory Visit: Payer: Self-pay | Admitting: *Deleted

## 2022-05-22 DIAGNOSIS — N184 Chronic kidney disease, stage 4 (severe): Secondary | ICD-10-CM | POA: Diagnosis not present

## 2022-05-22 DIAGNOSIS — E1129 Type 2 diabetes mellitus with other diabetic kidney complication: Secondary | ICD-10-CM | POA: Diagnosis not present

## 2022-05-22 DIAGNOSIS — Z794 Long term (current) use of insulin: Secondary | ICD-10-CM | POA: Diagnosis not present

## 2022-05-22 DIAGNOSIS — I129 Hypertensive chronic kidney disease with stage 1 through stage 4 chronic kidney disease, or unspecified chronic kidney disease: Secondary | ICD-10-CM | POA: Diagnosis not present

## 2022-05-22 DIAGNOSIS — E785 Hyperlipidemia, unspecified: Secondary | ICD-10-CM | POA: Diagnosis not present

## 2022-05-25 DIAGNOSIS — N390 Urinary tract infection, site not specified: Secondary | ICD-10-CM | POA: Diagnosis not present

## 2022-05-29 DIAGNOSIS — M5441 Lumbago with sciatica, right side: Secondary | ICD-10-CM | POA: Diagnosis not present

## 2022-05-29 DIAGNOSIS — M25551 Pain in right hip: Secondary | ICD-10-CM | POA: Diagnosis not present

## 2022-05-30 ENCOUNTER — Other Ambulatory Visit: Payer: Self-pay | Admitting: Neurosurgery

## 2022-05-30 DIAGNOSIS — M5441 Lumbago with sciatica, right side: Secondary | ICD-10-CM

## 2022-05-31 ENCOUNTER — Encounter: Payer: Self-pay | Admitting: *Deleted

## 2022-05-31 ENCOUNTER — Other Ambulatory Visit: Payer: Self-pay | Admitting: *Deleted

## 2022-05-31 NOTE — Patient Outreach (Unsigned)
[place] [facility]) Care Management  05/31/2022  [name] [name] 1941/03/14 [id number]  [facility]) Care Management RN Health Coach Note   05/31/2022 Name:  [name] [name] MRN:  [id number] DOB:  12/28/1941  Summary: Patient states that she wants to work towards improving her COPD. Patient denies exacerbation but reports that she becomes short of breath easily with exertion. Patient explains that she has not been using her rescue inhaler when this occurs. She typically will rest until she catches her breath. Nurse provided education regarding use of her rescue inhaler and will send patient COPD education. Patient has a goal of increasing her physical activity and nurse provided suggestions of how to gradually increase her physical activity as tolerated. Patient has chronic back pain of which she has received PT. She recently saw Dr. Christella Noa to further evaluate. Patient states her home environment is safe and that she is well supported by her family. Patient did not have any further questions or concerns today and did confirm that she has this nurse's contact number to call her if needed.   Recommendations/Changes made from today's visit: Call provider office for new concerns or questions  Review COPD action plan sent to you and contact your provider if in the yellow zone for 48 hours without improvement Remember to use your albuterol inhaler when you become short of breath more than usual  Subjective: [name] [name] is an 81 y.o. year old female who is a primary patient of Ginger Organ., MD. The care management team was consulted for assistance with care management and/or care coordination needs.    RN Health Coach completed Telephone Visit today.   Objective:  Medications Reviewed Today     Reviewed by Michiel Cowboy, RN (Registered Nurse) on 05/31/22 at 81  Med List Status: <None>   Medication Order Taking? Sig Documenting Provider Last Dose  Status Informant  albuterol (VENTOLIN HFA) 108 (90 Base) MCG/ACT inhaler [id number] Yes Inhale 2 puffs into the lungs every 6 (six) hours as needed for wheezing or shortness of breath. [provider] Taking Active Self  Celedonio Miyamoto 62.5-25 MCG/INH AEPB [id number] Yes Inhale 1 puff into the lungs daily. [provider] Taking Active   aspirin EC 81 MG tablet [id number] Yes Take 81 mg by mouth at bedtime. [provider] Taking Active Self  Carboxymethylcellul-Glycerin (LUBRICATING EYE DROPS OP) [id number] Yes Place 1 drop into both eyes 3 (three) times daily as needed (dry eyes).  [provider] Taking Active Self  Cholecalciferol (VITAMIN D-3) 125 MCG (5000 UT) TABS [id number] Yes Take 5,000 Units by mouth See admin instructions. Take 1 tablet (5000 units) by mouth every evening, except Fridays. [provider] Taking Active Self  diclofenac Sodium (VOLTAREN) 1 % GEL [id number] Yes Apply 2 g topically 4 (four) times daily. Couture, Cortni S, PA-C Taking Active   diltiazem (CARDIZEM CD) 240 MG 24 hr capsule [id number] Yes TAKE 1 CAPSULE DAILY Leonie Man, MD Taking Active   escitalopram (LEXAPRO) 10 MG tablet [id number] Yes Take 10 mg by mouth every morning.  [provider] Taking Active Self  FARXIGA 10 MG TABS tablet [id number] Yes Take 10 mg by mouth daily. [provider] Taking Active   ferrous sulfate 325 (65 FE) MG tablet [id number] No Take 325 mg by mouth daily with breakfast.  Patient not taking: Reported on 05/31/2022   [provider] Not Taking Active  Med Note Laretta Alstrom, Cathyann Kilfoyle A   Thu May 31, 2022  3:56 PM) Reports not taking  Fluticasone-Umeclidin-Vilant (TRELEGY ELLIPTA) 100-62.5-25 MCG/ACT AEPB [id number] No Inhale 1 puff into the lungs daily.  Patient not taking: Reported on 05/31/2022   Deneise Lever, MD Not Taking Active            Med Note Laretta Alstrom, Dell Briner A   Thu May 31, 2022  3:56 PM) Reports not taking   icosapent Ethyl (VASCEPA) 1 g capsule [id number] Yes Take 2 capsules by mouth 2 (two) times daily.  [provider] Taking Active Self  insulin aspart (NOVOLOG FLEXPEN) 100 UNIT/ML FlexPen [id number] Yes Inject 8 Units into the skin 3 (three) times daily with meals. [provider] Taking Active Self  insulin detemir (LEVEMIR) 100 UNIT/ML injection [id number] Yes Inject 40 Units into the skin daily.  [provider] Taking Active Self           Med Note Sharene Butters   Tue Mar 22, 2020  1:06 PM)    losartan (COZAAR) 100 MG tablet [id number] Yes TAKE 1 TABLET DAILY Leonie Man, MD Taking Active   rosuvastatin (CRESTOR) 10 MG tablet [id number] Yes Take 10 mg by mouth at bedtime. [provider] Taking Active   traMADol (ULTRAM) 50 MG tablet [id number] Yes Take 50 mg by mouth every 6 (six) hours as needed. [provider] Taking Active   TRULICITY 1.5 BH/4.1PF SOPN [id number] Yes Inject 1.5 mg into the skin every Wednesday.  [provider] Taking Active Self  vitamin B-12 (CYANOCOBALAMIN) 500 MCG tablet [id number] Yes Take 500 mcg by mouth daily. [provider] Taking Active   Vitamin D, Ergocalciferol, (DRISDOL) 50000 units CAPS capsule [id number] Yes Take 50,000 Units by mouth every Friday.  [provider] Taking Active Self  zinc gluconate 50 MG tablet [id number] No Take 50 mg by mouth daily.  Patient not taking: Reported on 05/31/2022   [provider] Not Taking Active            Med Note Laretta Alstrom, Cortlyn Cannell A   Thu May 31, 2022  3:56 PM) Reports not taking  Med List Note Annamaria Boots, Kasandra Knudsen, MD 07/22/11 2007): CPAP 9  [facility]             SDOH:  (Social Determinants of Health) assessments and interventions performed: SDOH assessments completed today and documented in the Epic system.  Care Plan  Review of patient past medical history, allergies, medications, health status, including review of consultants reports,  laboratory and other test data, was performed as part of comprehensive evaluation for care management services.   Care Plan : RN Care Manager Plan of Care  Updates made by Michiel Cowboy, RN since 05/31/2022 12:00 AM     Problem: Knowledge Deficit Related to COPD   Priority: High     Long-Range Goal: Development of Plan of Cre for the Management of COPD   Start Date: 05/31/2022  Expected End Date: 06/01/2023  Priority: High  Note:   Current Barriers:  Chronic Disease Management support and education needs related to COPD   RNCM Clinical Goal(s):  Patient will demonstrate Ongoing adherence to prescribed treatment plan for COPD as evidenced by No ED or hospital admissions due to COPD exacerbation continue to work with RN Care Manager to address care management and care coordination needs related to  COPD as evidenced by adherence to CM Team Scheduled appointments through collaboration with RN Care manager, provider, and  care team.   Interventions: Inter-disciplinary care team collaboration (see longitudinal plan of care) Evaluation of current treatment plan related to  self management and patient's adherence to plan as established by provider   COPD Interventions:  (Status:  New goal.) Long Term Goal Provided patient with basic written and verbal COPD education on self care/management/and exacerbation prevention Advised patient to track and manage COPD triggers Provided instruction about proper use of medications used for management of COPD including inhalers Advised patient to self assesses COPD action plan zone and make appointment with provider if in the yellow zone for 48 hours without improvement Advised patient to engage in light exercise as tolerated 3-5 days a week to aid in the the management of COPD Provided education about and advised patient to utilize infection prevention strategies to reduce risk of respiratory infection Discussed the importance of adequate rest and management  of fatigue with COPD Screening for signs and symptoms of depression related to chronic disease state  Assessed social determinant of health barriers  Patient Goals/Self-Care Activities: Take all medications as prescribed Attend all scheduled provider appointments Call provider office for new concerns or questions  identify and remove indoor air pollutants listen for public air quality announcements every day eliminate symptom triggers at home eat healthy/prescribed diet: Limit sugar, carbohydrates, salt, and drink Ensure as needed for decreased appetite practice relaxation or meditation daily; review meditation printouts or use smart TV to go to YouTube and type in meditation Do physical therapy exercises 3 times a week and increase time to 30 minutes Use smart TV to go to YouTube and type in chair yoga and senior chair exercises Increase your fluid intake by drinking 3 of your Yetti cups per day; may flavor water to increase your water intake Review COPD action plan sent to you and contact your provider if in the yellow zone for 48 hours without improvement Remember to use your albuterol inhaler when you become short of breath more than usual  Follow Up Plan:  Telephone follow up appointment with care management team member scheduled for:  July       Plan: Telephone follow up appointment with care management team member scheduled for:  July. Nurse will send PCP a barrier letter and today's assessment note.  [name] RN, [place] [phone] [email or web address]

## 2022-05-31 NOTE — Patient Instructions (Addendum)
Visit Information  Thank you for taking time to visit with me today. Please don't hesitate to contact me if I can be of assistance to you before our next scheduled telephone appointment.  Following are the goals we discussed today:   Take all medications as prescribed Attend all scheduled provider appointments Call provider office for new concerns or questions  identify and remove indoor air pollutants listen for public air quality announcements every day eliminate symptom triggers at home eat healthy/prescribed diet: Limit sugar, carbohydrates, salt, and drink Ensure as needed for decreased appetite practice relaxation or meditation daily; review meditation printouts or use smart TV to go to YouTube and type in meditation Do physical therapy exercises 3 times a week and increase time to 30 minutes Use smart TV to go to YouTube and type in chair yoga and senior chair exercises Increase your fluid intake by drinking 3 of your Yetti cups per day; may flavor water to increase your water intake Review COPD action plan sent to you and contact your provider if in the yellow zone for 48 hours without improvement Remember to use your albuterol inhaler when you become short of breath more than usual  Following is a copy of your care plan:  Care Plan : RN Care Manager Plan of Care  Updates made by Michiel Cowboy, RN since 05/31/2022 12:00 AM     Problem: Knowledge Deficit Related to COPD   Priority: High     Long-Range Goal: Development of Plan of Cre for the Management of COPD   Start Date: 05/31/2022  Expected End Date: 06/01/2023  Priority: High  Note:   Current Barriers:  Chronic Disease Management support and education needs related to COPD   RNCM Clinical Goal(s):  Patient will demonstrate Ongoing adherence to prescribed treatment plan for COPD as evidenced by No ED or hospital admissions due to COPD exacerbation continue to work with RN Care Manager to address care management and care  coordination needs related to  COPD as evidenced by adherence to CM Team Scheduled appointments through collaboration with RN Care manager, provider, and care team.   Interventions: Inter-disciplinary care team collaboration (see longitudinal plan of care) Evaluation of current treatment plan related to  self management and patient's adherence to plan as established by provider   COPD Interventions:  (Status:  New goal.) Long Term Goal Provided patient with basic written and verbal COPD education on self care/management/and exacerbation prevention Advised patient to track and manage COPD triggers Provided instruction about proper use of medications used for management of COPD including inhalers Advised patient to self assesses COPD action plan zone and make appointment with provider if in the yellow zone for 48 hours without improvement Advised patient to engage in light exercise as tolerated 3-5 days a week to aid in the the management of COPD Provided education about and advised patient to utilize infection prevention strategies to reduce risk of respiratory infection Discussed the importance of adequate rest and management of fatigue with COPD Screening for signs and symptoms of depression related to chronic disease state  Assessed social determinant of health barriers  Patient Goals/Self-Care Activities: Take all medications as prescribed Attend all scheduled provider appointments Call provider office for new concerns or questions  identify and remove indoor air pollutants listen for public air quality announcements every day eliminate symptom triggers at home eat healthy/prescribed diet: Limit sugar, carbohydrates, salt, and drink Ensure as needed for decreased appetite practice relaxation or meditation daily; review meditation printouts or  use smart TV to go to YouTube and type in meditation Do physical therapy exercises 3 times a week and increase time to 30 minutes Use smart TV to  go to YouTube and type in chair yoga and senior chair exercises Increase your fluid intake by drinking 3 of your Yetti cups per day; may flavor water to increase your water intake Review COPD action plan sent to you and contact your provider if in the yellow zone for 48 hours without improvement Remember to use your albuterol inhaler when you become short of breath more than usual  Follow Up Plan:  Telephone follow up appointment with care management team member scheduled for:  July      The patient verbalized understanding of instructions, educational materials, and care plan provided today and agreed to receive a mailed copy of patient instructions, educational materials, and care plan.   Telephone follow up appointment with care management team member scheduled for: 7/13 at 11:00  [place], [place] [phone] [name]

## 2022-06-14 ENCOUNTER — Ambulatory Visit
Admission: RE | Admit: 2022-06-14 | Discharge: 2022-06-14 | Disposition: A | Discharge status: Home / Self Care | Payer: Medicare Other | Source: Ambulatory Visit | Attending: Neurosurgery | Admitting: Neurosurgery

## 2022-06-14 DIAGNOSIS — M4316 Spondylolisthesis, lumbar region: Secondary | ICD-10-CM | POA: Diagnosis not present

## 2022-06-14 DIAGNOSIS — M5441 Lumbago with sciatica, right side: Secondary | ICD-10-CM

## 2022-06-14 DIAGNOSIS — M545 Low back pain, unspecified: Secondary | ICD-10-CM | POA: Diagnosis not present

## 2022-06-14 IMAGING — MR MR LUMBAR SPINE W/O CM
4 of 5 series · 26 of 48 positions shown · non-contrast
Comparison: [DATE]

CLINICAL DATA: Worsening chronic low back pain, pain in the right
buttock and hip extending down right leg

EXAM:
MRI LUMBAR SPINE WITHOUT CONTRAST
TECHNIQUE: Multiplanar, multisequence MR imaging of the lumbar spine was
performed. No intravenous contrast was administered.

[Series 3: T2 · sagittal · 4.0mm · 1.09mm/px · 6 of 15 slices shown (1 of 2)]
[im 1/15]
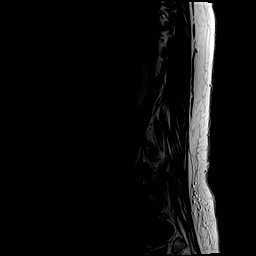
[im 3/15]
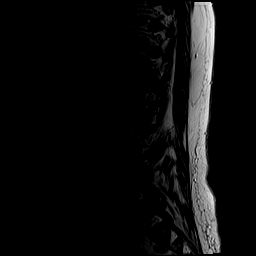
[im 6/15]
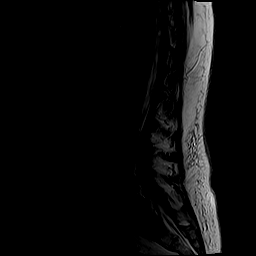
[im 9/15]
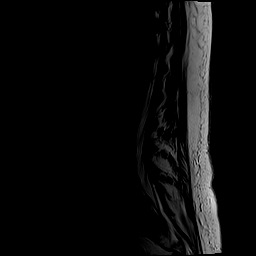
[im 12/15]
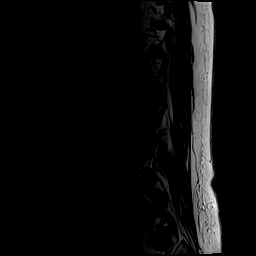
[im 15/15]
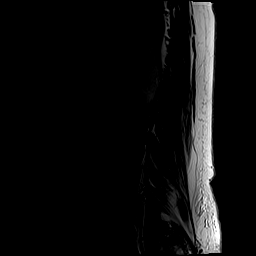

[Series 5: T1 · sagittal · 4.0mm · 1.09mm/px · 5 of 15 slices shown (1 of 2)]
[im 1/15]
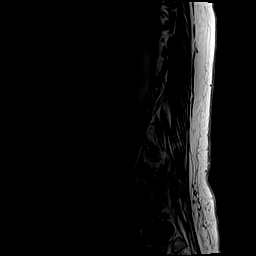
[im 4/15]
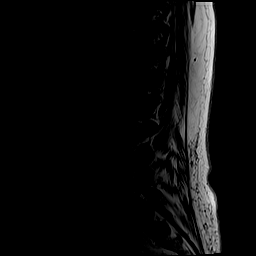
[im 8/15]
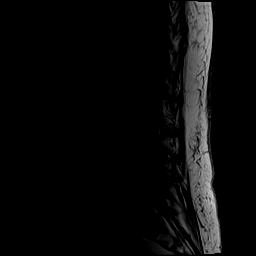
[im 11/15]
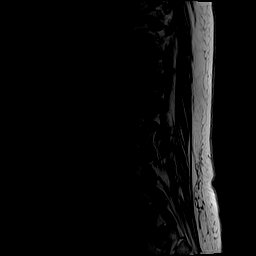
[im 15/15]
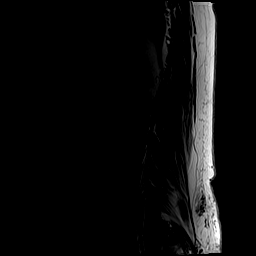

[Series 6: T2 · axial · 4.0mm · 0.39mm/px · z∈[-107,+120]mm · 10 of 44 slices shown (2 of 2)]
[im 3/44]
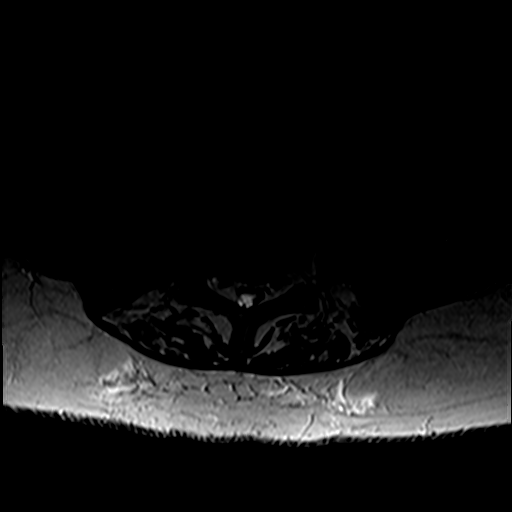
[im 6/44]
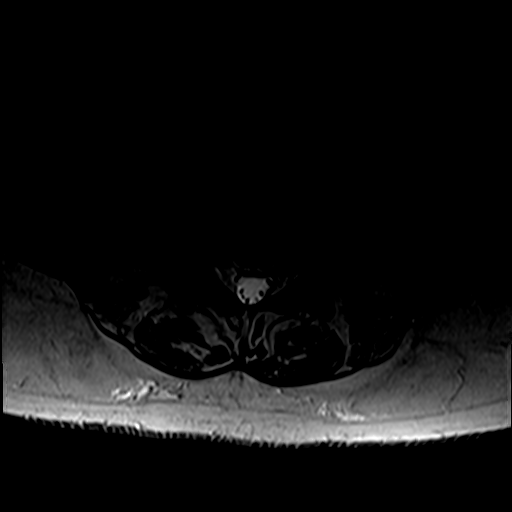
[im 9/44]
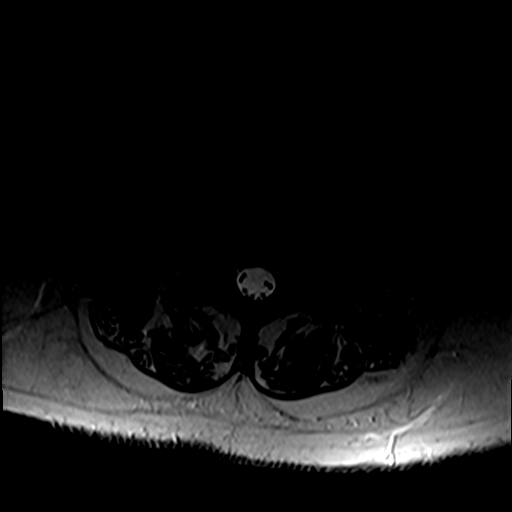
[im 15/44]
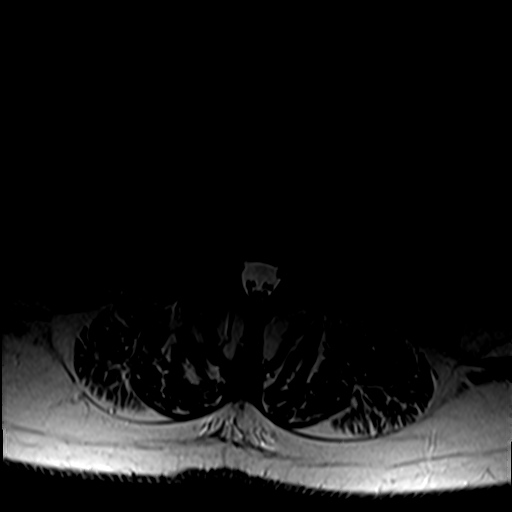
[im 21/44]
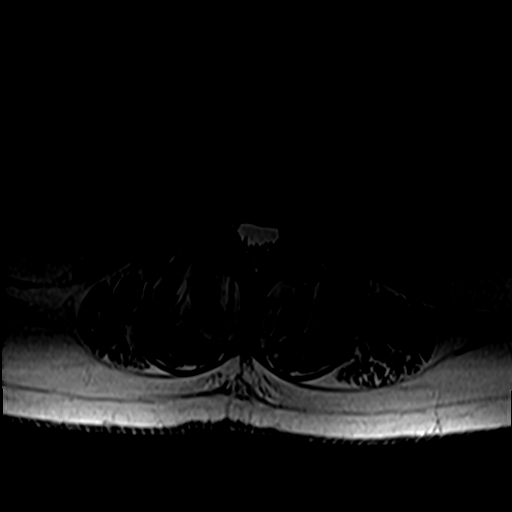
[im 23/44]
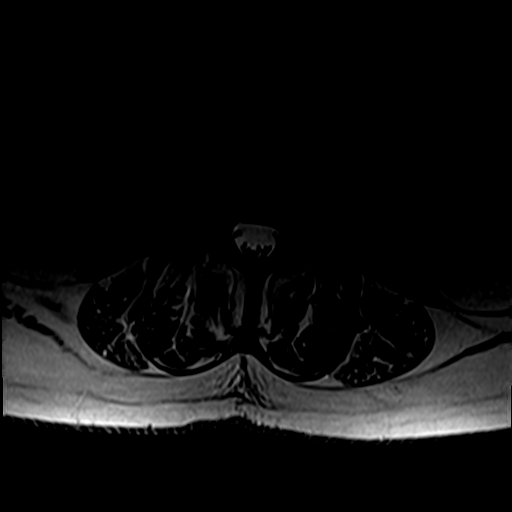
[im 26/44]
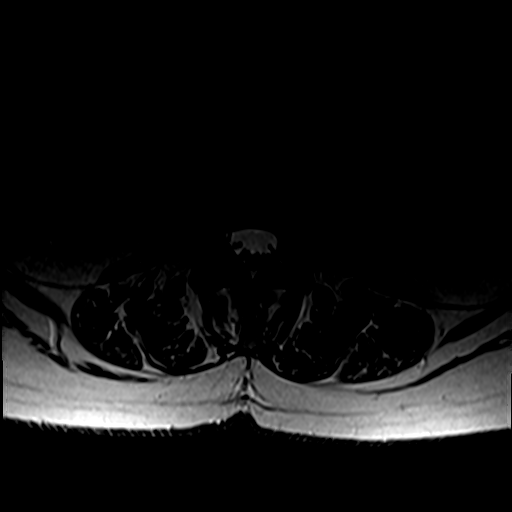
[im 32/44]
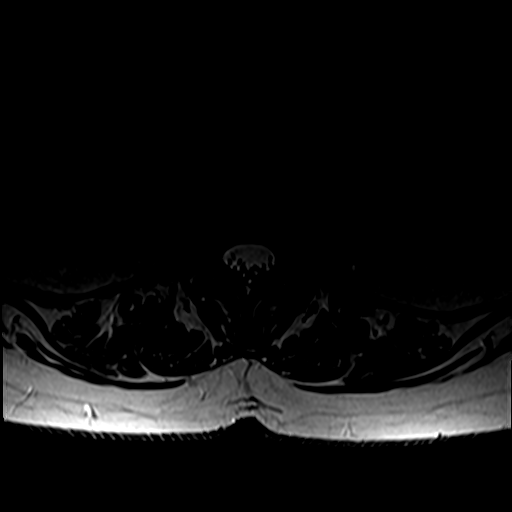
[im 38/44]
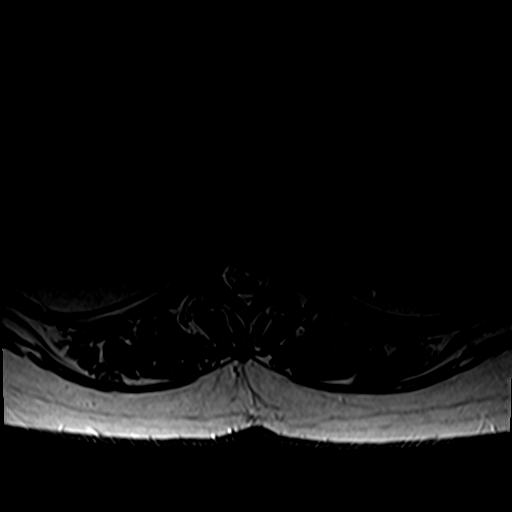
[im 44/44]
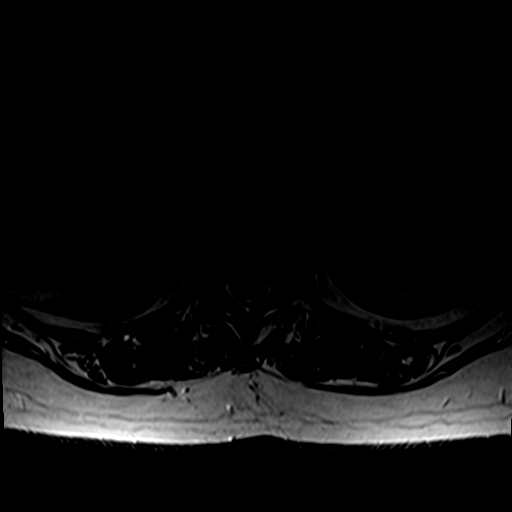

[Series 7: T1 · axial · 4.0mm · 0.39mm/px · z∈[-107,+91]mm · 5 of 44 slices shown (2 of 2)]
[im 3/44]
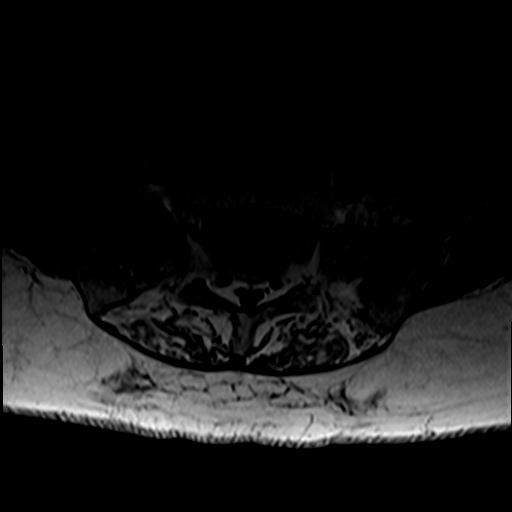
[im 6/44]
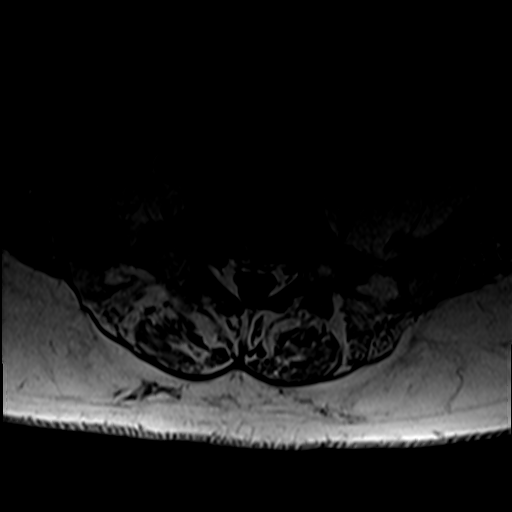
[im 9/44]
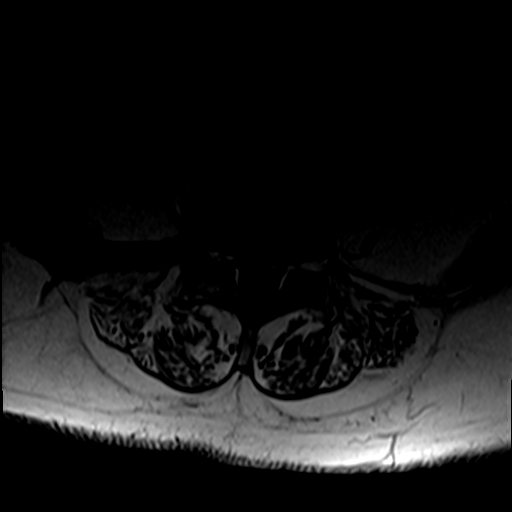
[im 23/44]
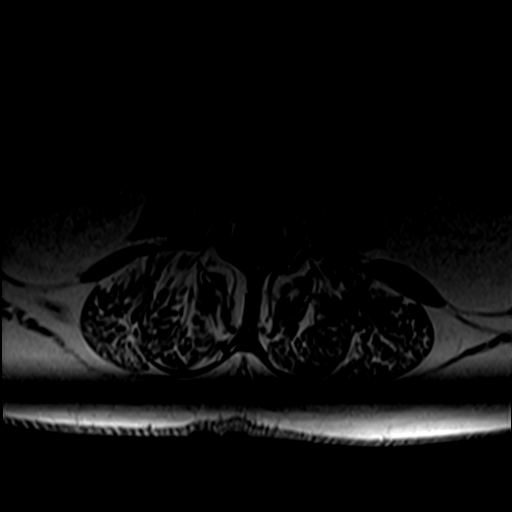
[im 38/44]
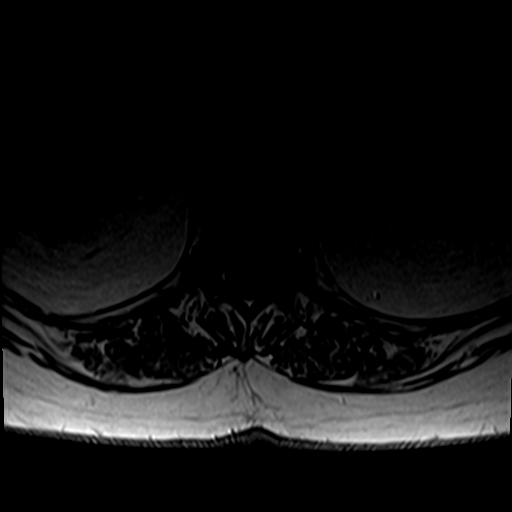

[26 of 48 positions shown; findings below may reference images not displayed]

FINDINGS: Segmentation: In keeping with the prior exam, the lowest rib-bearing
vertebral body is labeled T12, with 4 non-rib-bearing lumbar-type
vertebral bodies and sacralization of L5. The lowest well-developed
disc space is labeled L4-L5.

Alignment: Unchanged grade 1 anterolisthesis of L4 on L5. Mild
levocurvature.

Vertebrae: No acute fracture or suspicious osseous lesion. Multiple
T1 and T2 hyperintense intraosseous hemangiomas, most prominently in
L3, L4, and S1.

Conus medullaris and cauda equina: Conus extends to the T12-L1
level. Conus and cauda equina appear normal.

Paraspinal and other soft tissues: Renal cysts. Nonobstructing right
renal calcifications.

Disc levels:

T12-L1: No significant disc bulge. Mild facet arthropathy. No spinal
canal stenosis or neural foraminal narrowing.

L1-L2: Minimal disc bulge. Mild facet arthropathy. No spinal canal
stenosis or neural foraminal narrowing.

L2-L3: Mild disc bulge with small left foraminal protrusion. Mild
facet arthropathy. No spinal canal stenosis or neural foraminal
narrowing.

L3-L4: Mild disc bulge. Mild facet arthropathy. No spinal canal
stenosis or neural foraminal narrowing.

L4-L5: Grade 1 anterolisthesis with disc unroofing. Severe
right-greater-than-left facet arthropathy. No spinal canal stenosis
or neural foraminal narrowing.

L5-S1: Transitional level. No spinal canal stenosis or foraminal
narrowing.
IMPRESSION: 1. Transitional anatomy with sacralization of L5. Please correlate
with imaging if any intervention is planned.
2. Mild degenerative changes without spinal canal stenosis or neural
foraminal narrowing.
3. Redemonstrated grade 1 anterolisthesis of L4 on L5 with severe
right-greater-than-left facet arthropathy, which can be a cause of
back pain.

## 2022-06-15 ENCOUNTER — Ambulatory Visit: Payer: Self-pay | Admitting: *Deleted

## 2022-06-18 DIAGNOSIS — L565 Disseminated superficial actinic porokeratosis (DSAP): Secondary | ICD-10-CM | POA: Diagnosis not present

## 2022-06-18 DIAGNOSIS — C44729 Squamous cell carcinoma of skin of left lower limb, including hip: Secondary | ICD-10-CM | POA: Diagnosis not present

## 2022-06-18 DIAGNOSIS — X32XXXD Exposure to sunlight, subsequent encounter: Secondary | ICD-10-CM | POA: Diagnosis not present

## 2022-06-18 DIAGNOSIS — L570 Actinic keratosis: Secondary | ICD-10-CM | POA: Diagnosis not present

## 2022-06-19 DIAGNOSIS — M5441 Lumbago with sciatica, right side: Secondary | ICD-10-CM | POA: Diagnosis not present

## 2022-06-19 DIAGNOSIS — Z6832 Body mass index (BMI) 32.0-32.9, adult: Secondary | ICD-10-CM | POA: Diagnosis not present

## 2022-07-04 ENCOUNTER — Telehealth: Payer: Self-pay

## 2022-07-04 NOTE — Telephone Encounter (Signed)
Pt LM on the Urogyn VM on 06/02/2022. Attempt was made to contact the patient. Pt was not available. LM on the VM for the patient to call me back

## 2022-07-12 ENCOUNTER — Other Ambulatory Visit: Payer: Self-pay | Admitting: *Deleted

## 2022-07-12 NOTE — Patient Outreach (Signed)
[facility]) Care Management  07/12/2022  [name] [name] [birth year] [id number]  Nurse spoke to patient to inform her that this nurse's role has changed and she will no longer be making outreach calls. Nurse discussed with patient that she could be transferred to another CM to continue care. Patient declined, stating that she felt well and that no further care is not needed at this time.  Plan: Nurse will close case.    [name] RN, BSN [name] [phone] [name]

## 2022-07-18 DIAGNOSIS — R200 Anesthesia of skin: Secondary | ICD-10-CM | POA: Diagnosis not present

## 2022-07-23 DIAGNOSIS — M5441 Lumbago with sciatica, right side: Secondary | ICD-10-CM | POA: Diagnosis not present

## 2022-07-23 DIAGNOSIS — Z6831 Body mass index (BMI) 31.0-31.9, adult: Secondary | ICD-10-CM | POA: Diagnosis not present

## 2022-07-25 ENCOUNTER — Other Ambulatory Visit: Payer: Self-pay | Admitting: Cardiology

## 2022-08-02 DIAGNOSIS — Z85828 Personal history of other malignant neoplasm of skin: Secondary | ICD-10-CM | POA: Diagnosis not present

## 2022-08-02 DIAGNOSIS — X32XXXD Exposure to sunlight, subsequent encounter: Secondary | ICD-10-CM | POA: Diagnosis not present

## 2022-08-02 DIAGNOSIS — L570 Actinic keratosis: Secondary | ICD-10-CM | POA: Diagnosis not present

## 2022-08-02 DIAGNOSIS — Z08 Encounter for follow-up examination after completed treatment for malignant neoplasm: Secondary | ICD-10-CM | POA: Diagnosis not present

## 2022-08-02 DIAGNOSIS — C44722 Squamous cell carcinoma of skin of right lower limb, including hip: Secondary | ICD-10-CM | POA: Diagnosis not present

## 2022-08-13 DIAGNOSIS — R110 Nausea: Secondary | ICD-10-CM | POA: Diagnosis not present

## 2022-08-13 DIAGNOSIS — E1129 Type 2 diabetes mellitus with other diabetic kidney complication: Secondary | ICD-10-CM | POA: Diagnosis not present

## 2022-08-13 DIAGNOSIS — N390 Urinary tract infection, site not specified: Secondary | ICD-10-CM | POA: Diagnosis not present

## 2022-08-20 DIAGNOSIS — M8580 Other specified disorders of bone density and structure, unspecified site: Secondary | ICD-10-CM | POA: Diagnosis not present

## 2022-08-20 DIAGNOSIS — I2510 Atherosclerotic heart disease of native coronary artery without angina pectoris: Secondary | ICD-10-CM | POA: Diagnosis not present

## 2022-08-20 DIAGNOSIS — Z794 Long term (current) use of insulin: Secondary | ICD-10-CM | POA: Diagnosis not present

## 2022-08-20 DIAGNOSIS — J449 Chronic obstructive pulmonary disease, unspecified: Secondary | ICD-10-CM | POA: Diagnosis not present

## 2022-08-20 DIAGNOSIS — I700 Atherosclerosis of aorta: Secondary | ICD-10-CM | POA: Diagnosis not present

## 2022-08-20 DIAGNOSIS — E1129 Type 2 diabetes mellitus with other diabetic kidney complication: Secondary | ICD-10-CM | POA: Diagnosis not present

## 2022-08-20 DIAGNOSIS — I129 Hypertensive chronic kidney disease with stage 1 through stage 4 chronic kidney disease, or unspecified chronic kidney disease: Secondary | ICD-10-CM | POA: Diagnosis not present

## 2022-08-20 DIAGNOSIS — E1149 Type 2 diabetes mellitus with other diabetic neurological complication: Secondary | ICD-10-CM | POA: Diagnosis not present

## 2022-08-20 DIAGNOSIS — N184 Chronic kidney disease, stage 4 (severe): Secondary | ICD-10-CM | POA: Diagnosis not present

## 2022-08-20 DIAGNOSIS — D329 Benign neoplasm of meninges, unspecified: Secondary | ICD-10-CM | POA: Diagnosis not present

## 2022-08-20 DIAGNOSIS — F3341 Major depressive disorder, recurrent, in partial remission: Secondary | ICD-10-CM | POA: Diagnosis not present

## 2022-08-20 DIAGNOSIS — E669 Obesity, unspecified: Secondary | ICD-10-CM | POA: Diagnosis not present

## 2022-09-12 ENCOUNTER — Telehealth: Payer: Self-pay | Admitting: Internal Medicine

## 2022-09-12 NOTE — Telephone Encounter (Signed)
Patient states that she has had a hard time getting a CPAP machine from [facility] and would like to go through [facility] instead- they just need an order sent to them.  Please advise.

## 2022-09-13 NOTE — Telephone Encounter (Signed)
Estill Bamberg - please send DME order to [facility].

## 2022-09-13 NOTE — Telephone Encounter (Signed)
Order has been sent to [facility]. Nothing further needed at this time  

## 2022-09-27 DIAGNOSIS — C44722 Squamous cell carcinoma of skin of right lower limb, including hip: Secondary | ICD-10-CM | POA: Diagnosis not present

## 2022-09-27 DIAGNOSIS — C44729 Squamous cell carcinoma of skin of left lower limb, including hip: Secondary | ICD-10-CM | POA: Diagnosis not present

## 2022-10-05 DIAGNOSIS — Z23 Encounter for immunization: Secondary | ICD-10-CM | POA: Diagnosis not present

## 2022-10-10 ENCOUNTER — Telehealth: Payer: Self-pay | Admitting: Internal Medicine

## 2022-10-10 NOTE — Telephone Encounter (Signed)
See encounter from 10/11.

## 2022-10-10 NOTE — Telephone Encounter (Signed)
Called patient but she did not answer. Left message for her to call back.  

## 2022-10-22 ENCOUNTER — Encounter: Payer: Self-pay | Admitting: *Deleted

## 2022-10-24 DIAGNOSIS — K5901 Slow transit constipation: Secondary | ICD-10-CM | POA: Diagnosis not present

## 2022-10-26 DIAGNOSIS — G5711 Meralgia paresthetica, right lower limb: Secondary | ICD-10-CM | POA: Diagnosis not present

## 2022-10-26 DIAGNOSIS — G6289 Other specified polyneuropathies: Secondary | ICD-10-CM | POA: Diagnosis not present

## 2022-10-26 DIAGNOSIS — R202 Paresthesia of skin: Secondary | ICD-10-CM | POA: Diagnosis not present

## 2022-11-01 ENCOUNTER — Encounter: Payer: Self-pay | Admitting: Internal Medicine

## 2022-11-14 DIAGNOSIS — Z1211 Encounter for screening for malignant neoplasm of colon: Secondary | ICD-10-CM | POA: Diagnosis not present

## 2022-11-14 DIAGNOSIS — Z1212 Encounter for screening for malignant neoplasm of rectum: Secondary | ICD-10-CM | POA: Diagnosis not present

## 2022-11-19 DIAGNOSIS — R051 Acute cough: Secondary | ICD-10-CM | POA: Diagnosis not present

## 2022-11-19 DIAGNOSIS — J189 Pneumonia, unspecified organism: Secondary | ICD-10-CM | POA: Diagnosis not present

## 2022-11-19 DIAGNOSIS — R5383 Other fatigue: Secondary | ICD-10-CM | POA: Diagnosis not present

## 2022-11-19 DIAGNOSIS — J449 Chronic obstructive pulmonary disease, unspecified: Secondary | ICD-10-CM | POA: Diagnosis not present

## 2022-11-19 DIAGNOSIS — Z1152 Encounter for screening for COVID-19: Secondary | ICD-10-CM | POA: Diagnosis not present

## 2022-11-19 DIAGNOSIS — R0981 Nasal congestion: Secondary | ICD-10-CM | POA: Diagnosis not present

## 2022-11-19 DIAGNOSIS — N184 Chronic kidney disease, stage 4 (severe): Secondary | ICD-10-CM | POA: Diagnosis not present

## 2022-11-19 DIAGNOSIS — F17210 Nicotine dependence, cigarettes, uncomplicated: Secondary | ICD-10-CM | POA: Diagnosis not present

## 2022-11-19 DIAGNOSIS — I129 Hypertensive chronic kidney disease with stage 1 through stage 4 chronic kidney disease, or unspecified chronic kidney disease: Secondary | ICD-10-CM | POA: Diagnosis not present

## 2023-01-02 DIAGNOSIS — Z86010 Personal history of colonic polyps: Secondary | ICD-10-CM | POA: Diagnosis not present

## 2023-01-02 DIAGNOSIS — K921 Melena: Secondary | ICD-10-CM | POA: Diagnosis not present

## 2023-01-02 DIAGNOSIS — R195 Other fecal abnormalities: Secondary | ICD-10-CM | POA: Diagnosis not present

## 2023-01-10 DIAGNOSIS — R0781 Pleurodynia: Secondary | ICD-10-CM | POA: Diagnosis not present

## 2023-01-10 DIAGNOSIS — Z1152 Encounter for screening for COVID-19: Secondary | ICD-10-CM | POA: Diagnosis not present

## 2023-01-10 DIAGNOSIS — M549 Dorsalgia, unspecified: Secondary | ICD-10-CM | POA: Diagnosis not present

## 2023-01-10 DIAGNOSIS — R5383 Other fatigue: Secondary | ICD-10-CM | POA: Diagnosis not present

## 2023-01-10 DIAGNOSIS — J441 Chronic obstructive pulmonary disease with (acute) exacerbation: Secondary | ICD-10-CM | POA: Diagnosis not present

## 2023-01-10 DIAGNOSIS — J449 Chronic obstructive pulmonary disease, unspecified: Secondary | ICD-10-CM | POA: Diagnosis not present

## 2023-01-10 DIAGNOSIS — J069 Acute upper respiratory infection, unspecified: Secondary | ICD-10-CM | POA: Diagnosis not present

## 2023-01-10 DIAGNOSIS — G43909 Migraine, unspecified, not intractable, without status migrainosus: Secondary | ICD-10-CM | POA: Diagnosis not present

## 2023-01-23 DIAGNOSIS — Z09 Encounter for follow-up examination after completed treatment for conditions other than malignant neoplasm: Secondary | ICD-10-CM | POA: Diagnosis not present

## 2023-01-23 DIAGNOSIS — D124 Benign neoplasm of descending colon: Secondary | ICD-10-CM | POA: Diagnosis not present

## 2023-01-23 DIAGNOSIS — Z86010 Personal history of colonic polyps: Secondary | ICD-10-CM | POA: Diagnosis not present

## 2023-01-23 DIAGNOSIS — K635 Polyp of colon: Secondary | ICD-10-CM | POA: Diagnosis not present

## 2023-01-23 DIAGNOSIS — D122 Benign neoplasm of ascending colon: Secondary | ICD-10-CM | POA: Diagnosis not present

## 2023-01-23 DIAGNOSIS — K649 Unspecified hemorrhoids: Secondary | ICD-10-CM | POA: Diagnosis not present

## 2023-01-23 DIAGNOSIS — R195 Other fecal abnormalities: Secondary | ICD-10-CM | POA: Diagnosis not present

## 2023-01-23 DIAGNOSIS — D120 Benign neoplasm of cecum: Secondary | ICD-10-CM | POA: Diagnosis not present

## 2023-01-23 DIAGNOSIS — K5730 Diverticulosis of large intestine without perforation or abscess without bleeding: Secondary | ICD-10-CM | POA: Diagnosis not present

## 2023-01-25 DIAGNOSIS — K635 Polyp of colon: Secondary | ICD-10-CM | POA: Diagnosis not present

## 2023-01-25 DIAGNOSIS — D120 Benign neoplasm of cecum: Secondary | ICD-10-CM | POA: Diagnosis not present

## 2023-01-25 DIAGNOSIS — D122 Benign neoplasm of ascending colon: Secondary | ICD-10-CM | POA: Diagnosis not present

## 2023-01-25 DIAGNOSIS — D124 Benign neoplasm of descending colon: Secondary | ICD-10-CM | POA: Diagnosis not present

## 2023-01-31 ENCOUNTER — Ambulatory Visit: Payer: Medicare Other | Admitting: Neurology

## 2023-02-12 DIAGNOSIS — G5711 Meralgia paresthetica, right lower limb: Secondary | ICD-10-CM | POA: Diagnosis not present

## 2023-02-12 DIAGNOSIS — R202 Paresthesia of skin: Secondary | ICD-10-CM | POA: Diagnosis not present

## 2023-02-12 DIAGNOSIS — M79651 Pain in right thigh: Secondary | ICD-10-CM | POA: Diagnosis not present

## 2023-02-12 DIAGNOSIS — R200 Anesthesia of skin: Secondary | ICD-10-CM | POA: Diagnosis not present

## 2023-02-25 DIAGNOSIS — D649 Anemia, unspecified: Secondary | ICD-10-CM | POA: Diagnosis not present

## 2023-02-25 DIAGNOSIS — E559 Vitamin D deficiency, unspecified: Secondary | ICD-10-CM | POA: Diagnosis not present

## 2023-02-25 DIAGNOSIS — I10 Essential (primary) hypertension: Secondary | ICD-10-CM | POA: Diagnosis not present

## 2023-02-25 DIAGNOSIS — E785 Hyperlipidemia, unspecified: Secondary | ICD-10-CM | POA: Diagnosis not present

## 2023-02-25 DIAGNOSIS — E1129 Type 2 diabetes mellitus with other diabetic kidney complication: Secondary | ICD-10-CM | POA: Diagnosis not present

## 2023-02-25 DIAGNOSIS — E041 Nontoxic single thyroid nodule: Secondary | ICD-10-CM | POA: Diagnosis not present

## 2023-02-25 DIAGNOSIS — R7989 Other specified abnormal findings of blood chemistry: Secondary | ICD-10-CM | POA: Diagnosis not present

## 2023-02-25 DIAGNOSIS — D72829 Elevated white blood cell count, unspecified: Secondary | ICD-10-CM | POA: Diagnosis not present

## 2023-03-04 DIAGNOSIS — Z1339 Encounter for screening examination for other mental health and behavioral disorders: Secondary | ICD-10-CM | POA: Diagnosis not present

## 2023-03-04 DIAGNOSIS — E1149 Type 2 diabetes mellitus with other diabetic neurological complication: Secondary | ICD-10-CM | POA: Diagnosis not present

## 2023-03-04 DIAGNOSIS — J189 Pneumonia, unspecified organism: Secondary | ICD-10-CM | POA: Diagnosis not present

## 2023-03-04 DIAGNOSIS — E1129 Type 2 diabetes mellitus with other diabetic kidney complication: Secondary | ICD-10-CM | POA: Diagnosis not present

## 2023-03-04 DIAGNOSIS — Z1331 Encounter for screening for depression: Secondary | ICD-10-CM | POA: Diagnosis not present

## 2023-03-04 DIAGNOSIS — U071 COVID-19: Secondary | ICD-10-CM | POA: Diagnosis not present

## 2023-03-04 DIAGNOSIS — J441 Chronic obstructive pulmonary disease with (acute) exacerbation: Secondary | ICD-10-CM | POA: Diagnosis not present

## 2023-03-04 DIAGNOSIS — Z0000 Encounter for general adult medical examination without abnormal findings: Secondary | ICD-10-CM | POA: Diagnosis not present

## 2023-03-04 DIAGNOSIS — N184 Chronic kidney disease, stage 4 (severe): Secondary | ICD-10-CM | POA: Diagnosis not present

## 2023-03-04 DIAGNOSIS — I10 Essential (primary) hypertension: Secondary | ICD-10-CM | POA: Diagnosis not present

## 2023-03-08 DIAGNOSIS — J441 Chronic obstructive pulmonary disease with (acute) exacerbation: Secondary | ICD-10-CM | POA: Diagnosis not present

## 2023-03-08 DIAGNOSIS — N184 Chronic kidney disease, stage 4 (severe): Secondary | ICD-10-CM | POA: Diagnosis not present

## 2023-03-08 DIAGNOSIS — U071 COVID-19: Secondary | ICD-10-CM | POA: Diagnosis not present

## 2023-03-08 DIAGNOSIS — Z8616 Personal history of COVID-19: Secondary | ICD-10-CM | POA: Diagnosis not present

## 2023-03-08 DIAGNOSIS — J189 Pneumonia, unspecified organism: Secondary | ICD-10-CM | POA: Diagnosis not present

## 2023-03-16 NOTE — Progress Notes (Signed)
Patient ID: [name] [name], female    DOB: [birth year], 82 y.o.   MRN: [id number]  HPI F former smoker- Followed for allergic rhinitis, hypersomnia w/ OSA, COPD, complicated by DM, HBP, GERD NPSG 10/10/06 RDI/AHI 15.6/hr PFT 12/09/2015-moderate obstruction, diffusion severely reduced.  FVC 1.6/59%, FEV1 1.18/58%, ratio 0.74, TLC 81%, DLCO 42%  ----------------------------------------------------------------------------   03/16/22- 82 year old female former smoker followed for OSA, COPD, Lung Nodules, allergic rhinitis, complicated by DM 2, HBP, GERD, HBP, Aortic Atherosclerosis/ CAD, Aortic Stenosis,   -Anoro, albuterol hfa, Melatonin CPAP 8/ [facility] Download-compliance 100%, AHI 0.8/ hr Body weight today-181 lbs Covid vax-4 Moderna Flu vax-had -----Patient is doing good, no concerns Still using old [facility] machine which came with SD card with someone else's name on it. This machine still works well. We discussed replacing it nd would probably change to auto 5-15 when that happens. Admits some DOE- nothing new. Some wheeze but no cough. Back pain mostly limits her activity.  She is aware of CAD, evident on CT. CT chest 09/11/21 (Dr Clelia Croft)-  IMPRESSION: Stable left upper lobe pulmonary nodules, considered benign given their stability over time. Further follow-up is not required. Multinodular goiter, better assessed on thyroid sonogram of 09/08/2019. Extensive multi-vessel coronary artery calcification. Moderate calcification of the aortic valve leaflets. Echocardiography may be helpful to assess for valvular dysfunction. Normal cardiac size. Aortic Atherosclerosis (ICD10-I70.0).  03/19/23-  82 year old female former smoker followed for OSA, COPD, Lung Nodules, allergic rhinitis, complicated by DM 2, HBP, GERD, HBP, Aortic Atherosclerosis/ CAD, Aortic Stenosis,   -Anoro, albuterol hfa, Melatonin CPAP 8/ [facility] Download-compliance 179 lbs Body weight today-97%, AHI 2.2/ hr Covid vax-4  Moderna Flu vax- -----Patient states SOB is worse  Dyspnea on exertion is chronic but she thinks it somewhat worse.  She had a pneumonia in her right lung, managed by her primary physician.  She is now pending follow-up chest x-ray at that office.  History of benign nodules.  Cough is described as nonproductive.  With this illness she had no fever but maybe some chills on the first day or so which are now gone.  She has finished antibiotic. She is aware of coronary artery disease and aortic stenosis.  Presumably these contribute at least some to dyspnea on exertion.  She is still using Anoro and uses rescue inhaler when needed. Got CPAP machine replaced.  Download reviewed. In the morning, around the time she wakes, she is aware of pressure discomfort in the right side of her chest through to her back which increases with deep breaths.  This feeling goes away as she begins to move around. She shows me a rash on her legs with umbilicated lesions.  I encouraged her to see a dermatologist and to show this to her primary physician at upcoming appointment.  I cannot exclude fungal dermatitis, which would raise question of whether it had any relationship to what is been going on in her chest.  I have not seen imaging from chest x-rays at all that is being managed by her primary physician.  Review of Systems- see HPI    + = positive Constitutional:   No-   weight loss, night sweats, fevers, chills, fatigue, lassitude. HEENT:   No-   headaches, difficulty swallowing, tooth/dental problems, sore throat,                  No-   sneezing, itching, ear ache, no-nasal congestion, post nasal drip,  CV:  No-   chest pain, orthopnea, PND,  swelling in lower extremities, anasarca, dizziness, palpitations GI:  No-   heartburn, indigestion, abdominal pain, nausea, vomiting,  Resp:   No-  excess mucus,             No-   productive cough,  No non-productive cough,  No-  coughing up of [name].              No-   change in  color of mucus.  No- wheezing.   Skin: No-   rash or lesions. GU:  MS:  No-   joint pain or swelling.  Psych:  No- change in mood or affect. No depression or anxiety.  No memory loss.   Objective:   Physical Exam General- Alert, Oriented, Affect-appropriate, Distress- none acute, +overweight Skin- rash-none, lesions- none, excoriation- none Lymphadenopathy- none Head- atraumatic            Eyes- Gross vision intact, PERRLA, conjunctivae clear secretions            Ears- Hearing, canals-normal            Nose- Clear, no-Septal dev, mucus, polyps, erosion, perforation             Throat- Mallampati III-IV , mucosa-not unusually dry , drainage- none,  tonsils- atrophic Neck- flexible , trachea midline, no stridor , thyroid nl, carotid no bruit Chest - symmetrical excursion , unlabored           Heart/CV- RRR , no murmur , no gallop  , no rub, nl s1 s2                           - JVD- none , edema- none, stasis changes- none, varices- none           Lung- + clear, unlabored, no-wheezing,cough- none, dullness-none, rub- none           Chest wall-  Abd-  Br/ Gen/ Rectal- Not done, not indicated Extrem- cyanosis- none, clubbing, none, atrophy- none, strength- nl Neuro- grossly intact to observation

## 2023-03-19 ENCOUNTER — Ambulatory Visit (INDEPENDENT_AMBULATORY_CARE_PROVIDER_SITE_OTHER): Payer: Medicare Other | Admitting: Internal Medicine

## 2023-03-19 ENCOUNTER — Encounter: Payer: Self-pay | Admitting: Internal Medicine

## 2023-03-19 VITALS — BP 132/66 | HR 66 | Ht 62.0 in | Wt 179.6 lb

## 2023-03-19 DIAGNOSIS — R072 Precordial pain: Secondary | ICD-10-CM | POA: Diagnosis not present

## 2023-03-19 DIAGNOSIS — R21 Rash and other nonspecific skin eruption: Secondary | ICD-10-CM | POA: Diagnosis not present

## 2023-03-19 DIAGNOSIS — G4733 Obstructive sleep apnea (adult) (pediatric): Secondary | ICD-10-CM

## 2023-03-19 DIAGNOSIS — J449 Chronic obstructive pulmonary disease, unspecified: Secondary | ICD-10-CM | POA: Diagnosis not present

## 2023-03-19 NOTE — Patient Instructions (Signed)
Call [facility] every week to remind them you are concerned about the places on your legs andd ask them to check statu of "cancellation list" for an earlier appointment.  When you see Dr Brigitte Pulse next week, let him know about the pressure discomfort in your right chest if it is still there, and the shortness of breath.  Show him the place on your legs. It is possible for fungal infection in the skin "blastomycosis" to look similar. That kind of infection sometimes can also cause lung infection.  Ok to continue CPAP 8. I'm glad that seems to be going well.   Try a sample of Trelegy inhaler- see if it works any better for your breathing than the Anoro.                          Inhale 1 puff then rinse mouth well, once daily  Order- DME [employer]- nebulizer compressor   Script for Duoneb neb solution    # 360 ml    1 neb every 6 hours, if needed,  refill 12    Dx COPD mixed type

## 2023-04-04 DIAGNOSIS — N184 Chronic kidney disease, stage 4 (severe): Secondary | ICD-10-CM | POA: Diagnosis not present

## 2023-04-04 DIAGNOSIS — J441 Chronic obstructive pulmonary disease with (acute) exacerbation: Secondary | ICD-10-CM | POA: Diagnosis not present

## 2023-04-04 DIAGNOSIS — Z8616 Personal history of COVID-19: Secondary | ICD-10-CM | POA: Diagnosis not present

## 2023-04-04 DIAGNOSIS — J189 Pneumonia, unspecified organism: Secondary | ICD-10-CM | POA: Diagnosis not present

## 2023-04-10 DIAGNOSIS — N184 Chronic kidney disease, stage 4 (severe): Secondary | ICD-10-CM | POA: Diagnosis not present

## 2023-04-10 DIAGNOSIS — L282 Other prurigo: Secondary | ICD-10-CM | POA: Diagnosis not present

## 2023-04-16 ENCOUNTER — Telehealth: Payer: Self-pay | Admitting: Cardiology

## 2023-04-16 NOTE — Telephone Encounter (Signed)
  Pt c/o medication issue:  1. Name of Medication: diltiazem (CARDIZEM CD) 240 MG 24 hr capsule   2. How are you currently taking this medication (dosage and times per day)? TAKE 1 CAPSULE DAILY   3. Are you having a reaction (difficulty breathing--STAT)? No   4. What is your medication issue? Pt said, she didn't know that she have to take this medication, she just found her bottler. She wants to confirm if she still be taking this medication, if yes she needs refill sent [facility]

## 2023-04-16 NOTE — Telephone Encounter (Signed)
Spoke with patient regarding medication. She states she just found this medication and is not sure if she should take it.  She states she probably has not taken it in 6 months.  She does not have the bottle as she put the pills in organizer and threw the bottle away so unsure of who prescribed and when.  Last on file on our end is 2022 and if this is the same prescription then it would be expired.  She states her BP has been fine, though she cannot see readings because her husband has the record. Advised if her BP is good and since it has been that long since taking it , then do not take until she is seen by her PCP or cardiology.  Advised that we would have scheduling reach out to her to schedule her F/U.  Please advise if new RX should be sent and patient should take until appt.  She has no issues or symptoms, doing "fine".

## 2023-04-20 NOTE — Telephone Encounter (Signed)
Yes that was a medication that I prescribed as noted in my note from September 2022, she had been on amlodipine and we converted the amlodipine to diltiazem to allow for some rate control.  If she has not been taking it clearly then I would not restart it until she is seen in follow-up.  Since I have not seen her in a year and a half, I would not have any way of assessing whether any she needs to be on it or not. Usually follow-up recommendations are usually can be at least once a year either with myself or an APP.  It was nice for her to be seen so we can determine whether she needs to be on diltiazem or not.  Bryan Lemma, MD

## 2023-04-22 DIAGNOSIS — R200 Anesthesia of skin: Secondary | ICD-10-CM | POA: Diagnosis not present

## 2023-04-22 DIAGNOSIS — J449 Chronic obstructive pulmonary disease, unspecified: Secondary | ICD-10-CM | POA: Diagnosis not present

## 2023-04-22 DIAGNOSIS — I129 Hypertensive chronic kidney disease with stage 1 through stage 4 chronic kidney disease, or unspecified chronic kidney disease: Secondary | ICD-10-CM | POA: Diagnosis not present

## 2023-04-22 DIAGNOSIS — Z23 Encounter for immunization: Secondary | ICD-10-CM | POA: Diagnosis not present

## 2023-04-22 DIAGNOSIS — E785 Hyperlipidemia, unspecified: Secondary | ICD-10-CM | POA: Diagnosis not present

## 2023-04-22 DIAGNOSIS — I700 Atherosclerosis of aorta: Secondary | ICD-10-CM | POA: Diagnosis not present

## 2023-04-22 DIAGNOSIS — Z0000 Encounter for general adult medical examination without abnormal findings: Secondary | ICD-10-CM | POA: Diagnosis not present

## 2023-04-22 DIAGNOSIS — M545 Low back pain, unspecified: Secondary | ICD-10-CM | POA: Diagnosis not present

## 2023-04-22 DIAGNOSIS — M8580 Other specified disorders of bone density and structure, unspecified site: Secondary | ICD-10-CM | POA: Diagnosis not present

## 2023-04-22 DIAGNOSIS — N184 Chronic kidney disease, stage 4 (severe): Secondary | ICD-10-CM | POA: Diagnosis not present

## 2023-04-22 DIAGNOSIS — Z1331 Encounter for screening for depression: Secondary | ICD-10-CM | POA: Diagnosis not present

## 2023-04-22 DIAGNOSIS — E1129 Type 2 diabetes mellitus with other diabetic kidney complication: Secondary | ICD-10-CM | POA: Diagnosis not present

## 2023-04-22 DIAGNOSIS — E669 Obesity, unspecified: Secondary | ICD-10-CM | POA: Diagnosis not present

## 2023-04-22 DIAGNOSIS — I2510 Atherosclerotic heart disease of native coronary artery without angina pectoris: Secondary | ICD-10-CM | POA: Diagnosis not present

## 2023-04-22 DIAGNOSIS — Z794 Long term (current) use of insulin: Secondary | ICD-10-CM | POA: Diagnosis not present

## 2023-04-22 DIAGNOSIS — Z1339 Encounter for screening examination for other mental health and behavioral disorders: Secondary | ICD-10-CM | POA: Diagnosis not present

## 2023-04-22 NOTE — Telephone Encounter (Signed)
Discussed recommendations from provider.  She states understanding of not taking the medication until seen by provider.  She has an appt. 05/01/23 with NP.  She will bring med list and also BP readings, at least twice a day , to her appointment as well.

## 2023-04-23 ENCOUNTER — Other Ambulatory Visit (HOSPITAL_COMMUNITY): Payer: Self-pay | Admitting: Internal Medicine

## 2023-04-23 DIAGNOSIS — Z1231 Encounter for screening mammogram for malignant neoplasm of breast: Secondary | ICD-10-CM

## 2023-04-25 ENCOUNTER — Encounter: Payer: Self-pay | Admitting: Internal Medicine

## 2023-04-25 DIAGNOSIS — R21 Rash and other nonspecific skin eruption: Secondary | ICD-10-CM | POA: Insufficient documentation

## 2023-04-25 NOTE — Assessment & Plan Note (Signed)
Umbilicated lesions on both legs. Plan-she will show to her primary physician.  It may be appropriate to refer her for biopsy unless a definite etiology is known.

## 2023-04-25 NOTE — Assessment & Plan Note (Signed)
Benefits from CPAP with good compliance and control Plan-continue CPAP at 8

## 2023-04-25 NOTE — Assessment & Plan Note (Signed)
Describing somewhat atypical right-sided chest pain through to her back which she notices if she wakes in the mornings.  It does not really sound exertional and may be positional.  I do not know how to correlate it with recent respiratory infection and known aortic stenosis.

## 2023-04-30 NOTE — Progress Notes (Deleted)
   Cardiology Clinic Note   Date: 04/30/2023 ID: Airika, Alkhatib [birth year], MRN [id number]  Primary Cardiologist:  Bryan Lemma, MD  Patient Profile    [name] [name] is a 82 y.o. female who presents to the clinic today for overdue follow-up.  Past medical history significant for: Coronary artery calcification. CT chest 10/05/2019: Thoracic aortic and coronary arterial vascular calcifications. Nuclear stress test 06/22/2020: Normal low risk study. Moderate aortic stenosis. Echo 06/26/2021: EF >75%.  Moderate LVH.  Moderate aortic valve stenosis, mean gradient 21.3 mmHg.  Improved gradient from June 2021. Hypertension. Hyperlipidemia.*** COPD. GERD. T2DM. OSA.   History of Present Illness    [name] [name] patient was first evaluated by Dr. Herbie Baltimore on 06/30/2018 for chest tightness and DOE at the request of Dr. Clelia Croft.  Echo showed normal LV function, Grade I DD mild aortic valve stenosis.  Nuclear stress test was a normal low risk study.  Patient was last seen in the office by Dr. Herbie Baltimore on 09/06/2021 for follow-up.  She was doing well from a cardiac standpoint and no medication changes were made.  Patient contacted triage on 04/22/2023 questioning diltiazem.  She reported she came across the bottle and was not sure if she should be taking it.  Per Dr. Herbie Baltimore patient was prescribed that medication at office visit March 2022 patient was instructed to not take medicine until seen in the office.  Today, patient ***  Repeat echo?  Coronary artery calcification.  Seen incidentally on chest CT.  Abnormal low risk nuclear stress test June 2021.  Patient*** Continue aspirin, rosuvastatin. Moderate aortic stenosis.  Echo June 2022 showed hyperdynamic LV function, moderate LVH, moderate aortic valve stenosis with mean gradient 21.3 mmHg.  Patient denies shortness of breath, DOE, lower extremity edema, lightheadedness, dizziness, presyncope, syncope.  Repeat echo for  surveillance. Hypertension. BP today *** Patient denies headaches, dizziness or vision changes. Continue *** Hyperlipidemia.***   ROS: All other systems reviewed and are otherwise negative except as noted in History of Present Illness.  Studies Reviewed    ECG personally reviewed by me today: ***  No significant changes from ***  Risk Assessment/Calculations    {Does this patient have ATRIAL FIBRILLATION?:[id number]} No BP recorded.  {Refresh Note OR Click here to enter BP  :1}***        Physical Exam    VS:  There were no vitals taken for this visit. , BMI There is no height or weight on file to calculate BMI.  GEN: Well nourished, well developed, in no acute distress. Neck: No JVD or carotid bruits. Cardiac: *** RRR. No murmurs. No rubs or gallops.   Respiratory:  Respirations regular and unlabored. Clear to auscultation without rales, wheezing or rhonchi. GI: Soft, nontender, nondistended. Extremities: Radials/DP/PT 2+ and equal bilaterally. No clubbing or cyanosis. No edema ***  Skin: Warm and dry, no rash. Neuro: Strength intact.  Assessment & Plan   ***  Disposition: ***     {Are you ordering a CV Procedure (e.g. stress test, cath, DCCV, TEE, etc)?   Press F2        :[id number]}   Signed, Etta Grandchild. Magdaline Zollars, DNP, NP-C

## 2023-05-01 ENCOUNTER — Ambulatory Visit: Payer: Medicare Other | Attending: Student | Admitting: Student

## 2023-05-02 ENCOUNTER — Encounter: Payer: Self-pay | Admitting: Student

## 2023-05-03 ENCOUNTER — Ambulatory Visit (HOSPITAL_COMMUNITY)
Admission: RE | Admit: 2023-05-03 | Discharge: 2023-05-03 | Disposition: A | Discharge status: Home / Self Care | Payer: Medicare Other | Source: Ambulatory Visit | Attending: Internal Medicine | Admitting: Internal Medicine

## 2023-05-03 DIAGNOSIS — Z1231 Encounter for screening mammogram for malignant neoplasm of breast: Secondary | ICD-10-CM | POA: Insufficient documentation

## 2023-05-29 DIAGNOSIS — H02055 Trichiasis without entropian left lower eyelid: Secondary | ICD-10-CM | POA: Diagnosis not present

## 2023-05-29 DIAGNOSIS — E119 Type 2 diabetes mellitus without complications: Secondary | ICD-10-CM | POA: Diagnosis not present

## 2023-05-29 DIAGNOSIS — H52223 Regular astigmatism, bilateral: Secondary | ICD-10-CM | POA: Diagnosis not present

## 2023-05-29 DIAGNOSIS — H18593 Other hereditary corneal dystrophies, bilateral: Secondary | ICD-10-CM | POA: Diagnosis not present

## 2023-05-29 DIAGNOSIS — H5203 Hypermetropia, bilateral: Secondary | ICD-10-CM | POA: Diagnosis not present

## 2023-05-29 DIAGNOSIS — Z961 Presence of intraocular lens: Secondary | ICD-10-CM | POA: Diagnosis not present

## 2023-05-29 DIAGNOSIS — H524 Presbyopia: Secondary | ICD-10-CM | POA: Diagnosis not present

## 2023-06-12 DIAGNOSIS — L905 Scar conditions and fibrosis of skin: Secondary | ICD-10-CM | POA: Diagnosis not present

## 2023-06-12 DIAGNOSIS — L299 Pruritus, unspecified: Secondary | ICD-10-CM | POA: Diagnosis not present

## 2023-06-12 DIAGNOSIS — L570 Actinic keratosis: Secondary | ICD-10-CM | POA: Diagnosis not present

## 2023-06-26 NOTE — Progress Notes (Addendum)
Cardiology Clinic Note   Date: 06/27/2023 ID: Baleigh, Bertrand 01-05-1941, MRN [id number]  Primary Cardiologist:  Bryan Lemma, MD  Patient Profile    [name] [name] is a 82 y.o. female who presents to the clinic today for delayed routine follow up.     Past medical history significant for: Coronary artery calcifications. Seen on CT. Nuclear stress test 06/22/2020: Normal, low risk study. Aortic stenosis. Echo 06/26/2021: EF >75%.  Moderate LVH.  Indeterminate diastolic parameters.  Normal RV function.  Moderate aortic valve stenosis, mean gradient 21.3 mmHg. Hypertension. Hyperlipidemia. Lipid panel 02/25/2023: LDL 74, HDL 35, TG 191, total 147. T2DM. OSA. COPD. GERD. CKD stave IV     History of Present Illness    [name] [name] patient was first evaluated by Dr. Herbie Baltimore on 06/30/2018 for chest tightness and DOE at the request of Dr. Clelia Croft.  Patient reported episodes of chest discomfort noted mostly when coming up steps and associated with dyspnea that improves with rest.  She has baseline dyspnea secondary to COPD.  She reported she AS relatively sedentary and was likely deconditioned.  Nuclear stress test was a normal risk study.  She continues to be followed for the above outlined history.  Patient was last seen in the office by Dr. Herbie Baltimore on 09/06/2021 for routine follow-up.  She was changed from amlodipine to diltiazem for rate control.  Patient contacted the office on 04/16/2023 with questions regarding diltiazem and whether she should be taking it.  Given the time since her last visit Dr. Herbie Baltimore requested she be scheduled for follow-up.  Today, patient is here alone. She reports overall she is doing well. Patient reports shortness of breath is at baseline. No chest pain, pressure, or tightness. Denies orthopnea or PND. She reports over the last 2 months she has noticed lower extremity edema that has been progressively worsening. She does not have a history of  edema. She has been soaking her feet in epsom salt at night and then elevating her legs on a couple of pillows which is helping. Edema is at its best in the morning and worsens throughout the day. Her legs are in a dependent position much of the day. She has not tried compression socks. She does not weigh daily but notes her weight has been increasing. She restricts sodium in her diet.        ROS: All other systems reviewed and are otherwise negative except as noted in History of Present Illness.  Studies Reviewed    EKG Interpretation Date/Time:  Thursday June 27 2023 15:10:17 EDT Ventricular Rate:  66 PR Interval:  190 QRS Duration:  72 QT Interval:  414 QTC Calculation: 434 R Axis:   4  Text Interpretation: Normal sinus rhythm Possible Left atrial enlargement When compared with ECG of 15-Apr-2017 09:13, No significant change was found Confirmed by Carlos Levering [phone]) on 06/27/2023 5:15:21 PM            Physical Exam    VS:  BP 130/76   Pulse 66   Ht 5\' 3"  (1.6 m)   Wt 189 lb 3.2 oz (85.8 kg)   SpO2 96%   BMI 33.52 kg/m  , BMI Body mass index is 33.52 kg/m.  GEN: Well nourished, well developed, in no acute distress. Neck: No JVD or carotid bruits. Cardiac:  RRR. Harsh 2/6 systolic murmur. No rubs or gallops.   Respiratory:  Respirations regular and unlabored. Clear to auscultation without rales, wheezing or rhonchi. GI: Soft,  nontender, nondistended. Extremities: Radials/DP/PT 2+ and equal bilaterally. No clubbing or cyanosis. 1+ pitting edema bilateral ankles and feet, trace edema pretibial area.   Skin: Warm and dry, no rash. Neuro: Strength intact.  Assessment & Plan    Coronary artery calcifications.  Seen on CT.  Nuclear stress test June 2021 was a normal low risk study.  Patient denies chest pain, tightness and pressure.  Continue aspirin, diltiazem, rosuvastatin. Aortic stenosis/lower extremity edema.  Echo June 2022 showed normal LV/RV function, stable  moderate aortic stenosis with mean gradient 21.3 mmHg.  Patient denies chest pain, increased shortness of breath/DOE, activity intolerance, orthopnea, PND.  Patient has lower extremity edema for the last 2 months that is new for her. She has been soaking her legs in epsom salt at night and elevating them while she sleeps with some temporary improvement. Edema is at its best in the morning and progresses throughout the day. Legs are in a dependent position much of the day. She does not weigh every day but has noted her weight has increased. No recent labs. Given patient's CKD stage IV will get BMP before deciding on diuretic course. Patient is instructed to continue elevation at night, try to elevate more during the day, wear compression socks. 1+ pitting edema bilateral feet and ankles, trace edema pretibial area. Will get echo. Hypertension: BP today 130/76. Patient denies headaches, dizziness or vision changes. Continue losartan, diltiazem. Hyperlipidemia. LDL February 2024 74. Continue rosuvastatin.   Disposition: Echo. BMP. Will decide diuretic based on lab results. Return in 8-10 weeks or sooner as needed.          Signed, Etta Grandchild. , DNP, NP-C

## 2023-06-27 ENCOUNTER — Ambulatory Visit: Payer: Medicare Other | Attending: Student | Admitting: Student

## 2023-06-27 ENCOUNTER — Encounter: Payer: Self-pay | Admitting: Student

## 2023-06-27 VITALS — BP 130/76 | HR 66 | Ht 63.0 in | Wt 189.2 lb

## 2023-06-27 DIAGNOSIS — R600 Localized edema: Secondary | ICD-10-CM | POA: Diagnosis not present

## 2023-06-27 DIAGNOSIS — E1169 Type 2 diabetes mellitus with other specified complication: Secondary | ICD-10-CM | POA: Diagnosis not present

## 2023-06-27 DIAGNOSIS — Z79899 Other long term (current) drug therapy: Secondary | ICD-10-CM | POA: Diagnosis not present

## 2023-06-27 DIAGNOSIS — I350 Nonrheumatic aortic (valve) stenosis: Secondary | ICD-10-CM

## 2023-06-27 DIAGNOSIS — I2510 Atherosclerotic heart disease of native coronary artery without angina pectoris: Secondary | ICD-10-CM

## 2023-06-27 DIAGNOSIS — I10 Essential (primary) hypertension: Secondary | ICD-10-CM | POA: Diagnosis not present

## 2023-06-27 DIAGNOSIS — E785 Hyperlipidemia, unspecified: Secondary | ICD-10-CM

## 2023-06-27 DIAGNOSIS — Z794 Long term (current) use of insulin: Secondary | ICD-10-CM | POA: Diagnosis not present

## 2023-06-27 NOTE — Patient Instructions (Signed)
Medication Instructions:   Your physician recommends that you continue on your current medications as directed. Please refer to the Current Medication list given to you today.  *If you need a refill on your cardiac medications before your next appointment, please call your pharmacy*  Lab Work: [name] Levering, NP recommends that you have lab work TODAY:  BMP  If you have labs (blood work) drawn today and your tests are completely normal, you will receive your results only by: MyChart Message (if you have MyChart) OR A paper copy in the mail If you have any lab test that is abnormal or we need to change your treatment, we will call you to review the results.  Testing/Procedures: Your physician has requested that you have an echocardiogram. Echocardiography is a painless test that uses sound waves to create images of your heart. It provides your doctor with information about the size and shape of your heart and how well your heart's chambers and valves are working. This procedure takes approximately one hour. There are no restrictions for this procedure. Please do NOT wear cologne, perfume, aftershave, or lotions (deodorant is allowed). Please arrive 15 minutes prior to your appointment time.  Follow-Up: At [facility], you and your health needs are our priority.  As part of our continuing mission to provide you with exceptional heart care, we have created designated Provider Care Teams.  These Care Teams include your primary Cardiologist (physician) and Advanced Practice Providers (APPs -  Physician Assistants and Nurse Practitioners) who all work together to provide you with the care you need, when you need it.  Your next appointment:   8-10 week(s)  Provider:   Carlos Levering, NP or APP        Other Instructions

## 2023-06-28 LAB — BASIC METABOLIC PANEL
BUN/Creatinine Ratio: 9 — ABNORMAL LOW (ref 12–28)
BUN: 19 mg/dL (ref 8–27)
CO2: 23 mmol/L (ref 20–29)
Calcium: 9.7 mg/dL (ref 8.7–10.3)
Chloride: 104 mmol/L (ref 96–106)
Creatinine, Ser: 2.23 mg/dL — ABNORMAL HIGH (ref 0.57–1.00)
Glucose: 178 mg/dL — ABNORMAL HIGH (ref 70–99)
Potassium: 4.1 mmol/L (ref 3.5–5.2)
Sodium: 143 mmol/L (ref 134–144)
eGFR: 21 mL/min/{1.73_m2} — ABNORMAL LOW (ref >59)

## 2023-07-01 ENCOUNTER — Telehealth: Payer: Self-pay

## 2023-07-01 ENCOUNTER — Other Ambulatory Visit: Payer: Self-pay

## 2023-07-01 DIAGNOSIS — Z79899 Other long term (current) drug therapy: Secondary | ICD-10-CM

## 2023-07-01 DIAGNOSIS — R600 Localized edema: Secondary | ICD-10-CM

## 2023-07-01 MED ORDER — FUROSEMIDE 20 MG PO TABS: 20.0000 mg | ORAL_TABLET | Freq: Every day | ORAL | Status: AC

## 2023-07-01 NOTE — Telephone Encounter (Signed)
-----   Message from Carlos Levering, NP sent at 07/01/2023  4:42 PM EDT ----- Please let patient know I apologize for the delay in getting back to her with these results. Kidney function is low but appears stable. Start Lasix 20 mg daily x 4 days.  Repeat BMP Monday or Tuesday of next week. Weight daily. Contact the office with weight gain of 3 lb overnight or 5 lb in a week.   Will you also make sure patient is being followed by nephrology. If she is not, please send referral.   Thank you!  DW

## 2023-07-01 NOTE — Telephone Encounter (Signed)
Spoke with pt, she has a nephrology appointment 8/9 at 11:40 with [facility]. She understands she is to take the furosemide 20mg  for 4 days. Medication sent to [facility]. She will be in next week to repeat labs. Pt verbalized understanding regarding monitoring fluid weight gain.

## 2023-07-30 ENCOUNTER — Ambulatory Visit (HOSPITAL_COMMUNITY): Payer: Medicare Other

## 2023-07-30 DIAGNOSIS — L570 Actinic keratosis: Secondary | ICD-10-CM | POA: Diagnosis not present

## 2023-07-31 DIAGNOSIS — L570 Actinic keratosis: Secondary | ICD-10-CM | POA: Diagnosis not present

## 2023-08-09 DIAGNOSIS — R809 Proteinuria, unspecified: Secondary | ICD-10-CM | POA: Diagnosis not present

## 2023-08-09 DIAGNOSIS — I129 Hypertensive chronic kidney disease with stage 1 through stage 4 chronic kidney disease, or unspecified chronic kidney disease: Secondary | ICD-10-CM | POA: Diagnosis not present

## 2023-08-09 DIAGNOSIS — Z87442 Personal history of urinary calculi: Secondary | ICD-10-CM | POA: Diagnosis not present

## 2023-08-09 DIAGNOSIS — N184 Chronic kidney disease, stage 4 (severe): Secondary | ICD-10-CM | POA: Diagnosis not present

## 2023-08-09 DIAGNOSIS — E1122 Type 2 diabetes mellitus with diabetic chronic kidney disease: Secondary | ICD-10-CM | POA: Diagnosis not present

## 2023-08-09 DIAGNOSIS — D509 Iron deficiency anemia, unspecified: Secondary | ICD-10-CM | POA: Diagnosis not present

## 2023-08-12 NOTE — Progress Notes (Deleted)
Cardiology Clinic Note   Date: 08/12/2023 ID: ROLLIE TREFRY, DOB Nov 25, 1941, MRN [id number]  Primary Cardiologist:  Bryan Lemma, MD  Patient Profile    [name] [name] is a 82 y.o. female who presents to the clinic today for ***    Past medical history significant for: Coronary artery calcifications. Seen on CT. Nuclear stress test 06/22/2020: Normal, low risk study. Aortic stenosis. Echo 06/26/2021: EF >75%.  Moderate LVH.  Indeterminate diastolic parameters.  Normal RV function.  Moderate aortic valve stenosis, mean gradient 21.3 mmHg. Hypertension. Hyperlipidemia. Lipid panel 02/25/2023: LDL 74, HDL 35, TG 191, total 147. T2DM. OSA. COPD. GERD. CKD stave IV      History of Present Illness    [name] [name] was first evaluated by Dr. Herbie Baltimore on 06/30/2018 for chest tightness and DOE at the request of Dr. Clelia Croft.  Patient reported episodes of chest discomfort noted mostly when coming up steps and associated with dyspnea that improves with rest.  She has baseline dyspnea secondary to COPD.  She reported she AS relatively sedentary and was likely deconditioned.  Nuclear stress test was a normal risk study.  She continues to be followed for the above outlined history.  Last seen in the office by me 06/27/2023 with reports of progressively worsening lower extremity edema best in the morning and progressing throughout the day.  She was soaking her feet in Epsom salt which seem to help as well as elevating her legs.  She was not wearing compression socks.  She was not weighing daily but did not weight had been increasing.  She reported sodium restriction.  BMP was drawn to further guide diuretics.  Kidney function was low but stable and she was given a 4-day course of Lasix 20 mg.  She was to return the following week to repeat BMP but it does not appear to have been updated in the system.  Today, patient ***  Coronary artery calcifications.  Seen on CT.  Nuclear stress test June  2021 was a normal low risk study.  Patient denies chest pain, tightness and pressure.  Continue aspirin, diltiazem, rosuvastatin.*** Aortic stenosis/lower extremity edema.  Echo June 2022 showed normal LV/RV function, stable moderate aortic stenosis with mean gradient 21.3 mmHg.  Patient denies chest pain, increased shortness of breath/DOE, activity intolerance, orthopnea, PND.  Patient *** Hypertension: BP today***. Patient denies headaches, dizziness or vision changes. Continue losartan, diltiazem. Hyperlipidemia. LDL February 2024 74. Continue rosuvastatin.    ROS: All other systems reviewed and are otherwise negative except as noted in History of Present Illness.  Studies Reviewed       ***  Risk Assessment/Calculations    {Does this patient have ATRIAL FIBRILLATION?[phone]} No BP recorded.  {Refresh Note OR Click here to enter BP  :1}***        Physical Exam    VS:  There were no vitals taken for this visit. , BMI There is no height or weight on file to calculate BMI.  GEN: Well nourished, well developed, in no acute distress. Neck: No JVD or carotid bruits. Cardiac: *** RRR. No murmurs. No rubs or gallops.   Respiratory:  Respirations regular and unlabored. Clear to auscultation without rales, wheezing or rhonchi. GI: Soft, nontender, nondistended. Extremities: Radials/DP/PT 2+ and equal bilaterally. No clubbing or cyanosis. No edema ***  Skin: Warm and dry, no rash. Neuro: Strength intact.  Assessment & Plan   ***  Disposition: ***     {Are you ordering a CV Procedure (  e.g. stress test, cath, DCCV, TEE, etc)?   Press F2        :[id number]}   Signed, Etta Grandchild. , DNP, NP-C

## 2023-08-15 ENCOUNTER — Ambulatory Visit: Payer: Medicare Other | Attending: Student | Admitting: Student

## 2023-08-23 ENCOUNTER — Ambulatory Visit (HOSPITAL_COMMUNITY): Payer: Medicare Other | Attending: Student

## 2023-08-23 DIAGNOSIS — I350 Nonrheumatic aortic (valve) stenosis: Secondary | ICD-10-CM | POA: Diagnosis not present

## 2023-08-27 LAB — ECHOCARDIOGRAM COMPLETE
AR max vel: 0.97 cm2
AV Area VTI: 0.98 cm2
AV Area mean vel: 1.13 cm2
AV Mean grad: 34 mmHg
AV Peak grad: 58.4 mmHg
Ao pk vel: 3.82 m/s
Area-P 1/2: 3.01 cm2
S' Lateral: 1.8 cm

## 2023-08-29 DIAGNOSIS — I129 Hypertensive chronic kidney disease with stage 1 through stage 4 chronic kidney disease, or unspecified chronic kidney disease: Secondary | ICD-10-CM | POA: Diagnosis not present

## 2023-08-29 DIAGNOSIS — E785 Hyperlipidemia, unspecified: Secondary | ICD-10-CM | POA: Diagnosis not present

## 2023-08-29 DIAGNOSIS — E1129 Type 2 diabetes mellitus with other diabetic kidney complication: Secondary | ICD-10-CM | POA: Diagnosis not present

## 2023-08-29 DIAGNOSIS — Z794 Long term (current) use of insulin: Secondary | ICD-10-CM | POA: Diagnosis not present

## 2023-08-29 DIAGNOSIS — E669 Obesity, unspecified: Secondary | ICD-10-CM | POA: Diagnosis not present

## 2023-08-29 DIAGNOSIS — F3341 Major depressive disorder, recurrent, in partial remission: Secondary | ICD-10-CM | POA: Diagnosis not present

## 2023-08-29 DIAGNOSIS — F17210 Nicotine dependence, cigarettes, uncomplicated: Secondary | ICD-10-CM | POA: Diagnosis not present

## 2023-08-29 DIAGNOSIS — J449 Chronic obstructive pulmonary disease, unspecified: Secondary | ICD-10-CM | POA: Diagnosis not present

## 2023-08-29 DIAGNOSIS — Z23 Encounter for immunization: Secondary | ICD-10-CM | POA: Diagnosis not present

## 2023-08-29 DIAGNOSIS — I700 Atherosclerosis of aorta: Secondary | ICD-10-CM | POA: Diagnosis not present

## 2023-08-29 DIAGNOSIS — N184 Chronic kidney disease, stage 4 (severe): Secondary | ICD-10-CM | POA: Diagnosis not present

## 2023-08-29 DIAGNOSIS — M8580 Other specified disorders of bone density and structure, unspecified site: Secondary | ICD-10-CM | POA: Diagnosis not present

## 2023-09-12 DIAGNOSIS — L570 Actinic keratosis: Secondary | ICD-10-CM | POA: Diagnosis not present

## 2023-09-20 NOTE — Progress Notes (Unsigned)
Patient ID: [name] [name], female    DOB: 1941/05/07, 82 y.o.   MRN: [id number]  HPI F former smoker- Followed for allergic rhinitis, hypersomnia w/ OSA, COPD, complicated by DM, HBP, GERD NPSG 10/10/06 RDI/AHI 15.6/hr PFT 12/09/2015-moderate obstruction, diffusion severely reduced.  FVC 1.6/59%, FEV1 1.18/58%, ratio 0.74, TLC 81%, DLCO 42%  ----------------------------------------------------------------------------   03/19/23-  82 year old female former smoker followed for OSA, COPD, Lung Nodules, allergic rhinitis, complicated by DM 2, HBP, GERD, HBP, Aortic Atherosclerosis/ CAD, Aortic Stenosis,   -Anoro, albuterol hfa, Melatonin CPAP 8/ [employer] Download-compliance 179 lbs Body weight today-97%, AHI 2.2/ hr Covid vax-4 Moderna Flu vax- -----Patient states SOB is worse  Dyspnea on exertion is chronic but she thinks it somewhat worse.  She had a pneumonia in her right lung, managed by her primary physician.  She is now pending follow-up chest x-ray at that office.  History of benign nodules.  Cough is described as nonproductive.  With this illness she had no fever but maybe some chills on the first day or so which are now gone.  She has finished antibiotic. She is aware of coronary artery disease and aortic stenosis.  Presumably these contribute at least some to dyspnea on exertion.  She is still using Anoro and uses rescue inhaler when needed. Got CPAP machine replaced.  Download reviewed. In the morning, around the time she wakes, she is aware of pressure discomfort in the right side of her chest through to her back which increases with deep breaths.  This feeling goes away as she begins to move around. She shows me a rash on her legs with umbilicated lesions.  I encouraged her to see a dermatologist and to show this to her primary physician at upcoming appointment.  I cannot exclude fungal dermatitis, which would raise question of whether it had any relationship to what is been going on  in her chest.  I have not seen imaging from chest x-rays at all -that is being managed by her primary physician.  09/23/23- 82 year old female former smoker followed for OSA, COPD, Lung Nodules, allergic rhinitis, complicated by DM 2, HBP, GERD, HBP, Aortic Atherosclerosis/ CAD, Aortic Stenosis,   -Anoro /Trelegy 100, albuterol hfa, Melatonin CPAP 8/ [employer] Download-compliance 100%, AHI 2.6/hr Body weight today-186 lbs Had flu vax. Download reviewed.  She feels very comfortable with her CPAP. Notes morning wheeze until she takes her inhalers.  She has been taking Anoro in the morning.  I suggested she try samples of Trelegy taken in the evening to see if that clears the morning wheeze.  Review of Systems- see HPI    + = positive Constitutional:   No-   weight loss, night sweats, fevers, chills, fatigue, lassitude. HEENT:   No-   headaches, difficulty swallowing, tooth/dental problems, sore throat,                  No-   sneezing, itching, ear ache, no-nasal congestion, post nasal drip,  CV:  No-   chest pain, orthopnea, PND, swelling in lower extremities, anasarca, dizziness, palpitations GI:  No-   heartburn, indigestion, abdominal pain, nausea, vomiting,  Resp:   No-  excess mucus,             No-   productive cough,  No non-productive cough,  No-  coughing up of [name].              No-   change in color of mucus.  No- wheezing.   Skin:  No-   rash or lesions. GU:  MS:  No-   joint pain or swelling.  Psych:  No- change in mood or affect. No depression or anxiety.  No memory loss.   Objective:   Physical Exam General- Alert, Oriented, Affect-appropriate, Distress- none acute, +overweight Skin- rash-none, lesions- none, excoriation- none Lymphadenopathy- none Head- atraumatic            Eyes- Gross vision intact, PERRLA, conjunctivae clear secretions            Ears- Hearing, canals-normal            Nose- Clear, no-Septal dev, mucus, polyps, erosion, perforation             Throat-  Mallampati III-IV , mucosa-not unusually dry , drainage- none,  tonsils- atrophic Neck- flexible , trachea midline, no stridor , thyroid nl, carotid no bruit Chest - symmetrical excursion , unlabored           Heart/CV- RRR , no murmur , no gallop  , no rub, nl s1 s2                           - JVD- none , edema- none, stasis changes- none, varices- none           Lung- + clear, unlabored, no-wheezing,cough- none, dullness-none, rub- none           Chest wall-  Abd-  Br/ Gen/ Rectal- Not done, not indicated Extrem- cyanosis- none, clubbing, none, atrophy- none, strength- nl Neuro- grossly intact to observation

## 2023-09-23 ENCOUNTER — Encounter: Payer: Self-pay | Admitting: Internal Medicine

## 2023-09-23 ENCOUNTER — Ambulatory Visit (INDEPENDENT_AMBULATORY_CARE_PROVIDER_SITE_OTHER): Payer: Medicare Other | Admitting: Internal Medicine

## 2023-09-23 VITALS — BP 136/62 | HR 69 | Temp 97.2°F | Ht 62.0 in | Wt 186.6 lb

## 2023-09-23 DIAGNOSIS — J449 Chronic obstructive pulmonary disease, unspecified: Secondary | ICD-10-CM

## 2023-09-23 DIAGNOSIS — G4733 Obstructive sleep apnea (adult) (pediatric): Secondary | ICD-10-CM

## 2023-09-23 NOTE — Patient Instructions (Addendum)
Order- sample Trelegy 100    inhale 1 puff then rinse mouth, once daily. Try this instead of Anoro and see if it works better after a few days, to help with the morning wheeze.  We can continue CPAP 8

## 2023-09-25 ENCOUNTER — Encounter: Payer: Self-pay | Admitting: Internal Medicine

## 2023-09-25 NOTE — Assessment & Plan Note (Addendum)
Minimal breakthrough wheezing in the morning until she takes her inhaler, not quite sufficient control from Anoro taken in the morning which does not seem to be lasting through the night. Plan-samples of Trelegy again-pay attention to whether this gives a little longer control taken either in the morning or at night.

## 2023-09-25 NOTE — Assessment & Plan Note (Signed)
Effects from CPAP with good compliance and control Plan-continue CPAP 8

## 2023-10-02 ENCOUNTER — Other Ambulatory Visit (HOSPITAL_COMMUNITY): Payer: Self-pay

## 2023-10-03 ENCOUNTER — Ambulatory Visit (INDEPENDENT_AMBULATORY_CARE_PROVIDER_SITE_OTHER): Payer: Medicare Other

## 2023-10-03 ENCOUNTER — Other Ambulatory Visit (HOSPITAL_COMMUNITY): Payer: Self-pay

## 2023-10-03 ENCOUNTER — Ambulatory Visit: Payer: Medicare Other | Admitting: Podiatry

## 2023-10-03 DIAGNOSIS — M722 Plantar fascial fibromatosis: Secondary | ICD-10-CM

## 2023-10-03 DIAGNOSIS — M7752 Other enthesopathy of left foot: Secondary | ICD-10-CM

## 2023-10-03 MED ORDER — MOUNJARO 7.5 MG/0.5ML ~~LOC~~ SOAJ
7.5000 mg | SUBCUTANEOUS | 2 refills | Status: DC
  Filled 2023-10-03: qty 6, 84d supply, fill #0
  Filled 2023-10-03: qty 2, 28d supply, fill #0
  Filled 2023-10-11: qty 6, 84d supply, fill #0

## 2023-10-03 NOTE — Progress Notes (Signed)
Subjective:  Patient ID: [name] [name], female    DOB: 12/21/1941,  MRN: [id number]  Chief Complaint  Patient presents with   Foot Pain    Left heel pain for several months    82 y.o. female presents with the above complaint.  Patient presents for pain in the bottom of the left heel.  Feels like she has a stone bruise for several months.  Tenderness with pressure on the heel especially after long distances of walking or 1 extended duration standing   Review of Systems: Negative except as noted in the HPI. Denies N/V/F/Ch.   Objective:  There were no vitals filed for this visit. There is no height or weight on file to calculate BMI. Constitutional Well developed. Well nourished.  Vascular Dorsalis pedis pulses palpable bilaterally. Posterior tibial pulses palpable bilaterally. Capillary refill normal to all digits.  No cyanosis or clubbing noted. Pedal hair growth normal.  Neurologic Normal speech. Oriented to person, place, and time. Epicritic sensation to light touch grossly present bilaterally.  Dermatologic Nails well groomed and normal in appearance. No open wounds. No skin lesions.  Orthopedic: Normal joint ROM without pain or crepitus bilaterally. No visible deformities. Tender to palpation at the calcaneal tuber left. No pain with calcaneal squeeze left. Ankle ROM diminished range of motion left. Silfverskiold Test: negative left.   Radiographs: Taken and reviewed. No acute fractures or dislocations. No evidence of stress fracture.  Plantar heel spur absent. Posterior heel spur absent.   Assessment:   1. Calcaneal bursitis (heel), left   2. Plantar fasciitis of left foot    Plan:  Patient was evaluated and treated and all questions answered.  Plantar heel bursitis, left - XR reviewed as above.  - Educated on icing and stretching. Instructions given.  - Injection delivered to the planta calcaneal bursa - DME: Recommend gel heel cups - Pharmacologic  management: Tylenol or ibuprofen as needed  Procedure: Injection Tendon/Ligament Location: Left plantar calcaneal bursa at the glabrous junction; medial approach. Skin Prep: alcohol Injectate: 1 cc 0.5% marcaine plain, 1 cc kenalog 10. Disposition: Patient tolerated procedure well. Injection site dressed with a band-aid.  Return in about 6 weeks (around 11/14/2023) for Follow-up left plantar calcaneal bursitis.

## 2023-10-03 NOTE — Patient Instructions (Signed)

## 2023-10-04 ENCOUNTER — Other Ambulatory Visit: Payer: Self-pay

## 2023-10-04 ENCOUNTER — Other Ambulatory Visit (HOSPITAL_BASED_OUTPATIENT_CLINIC_OR_DEPARTMENT_OTHER): Payer: Self-pay

## 2023-10-04 ENCOUNTER — Other Ambulatory Visit (HOSPITAL_COMMUNITY): Payer: Self-pay

## 2023-10-11 ENCOUNTER — Other Ambulatory Visit (HOSPITAL_COMMUNITY): Payer: Self-pay

## 2023-10-11 MED ORDER — MOUNJARO 7.5 MG/0.5ML ~~LOC~~ SOAJ
7.5000 mg | SUBCUTANEOUS | 3 refills | Status: DC
  Filled 2023-10-11: qty 2, 28d supply, fill #0

## 2023-11-14 ENCOUNTER — Ambulatory Visit (INDEPENDENT_AMBULATORY_CARE_PROVIDER_SITE_OTHER): Payer: Medicare Other | Admitting: Podiatry

## 2023-11-14 DIAGNOSIS — M722 Plantar fascial fibromatosis: Secondary | ICD-10-CM

## 2023-11-14 DIAGNOSIS — M7752 Other enthesopathy of left foot: Secondary | ICD-10-CM | POA: Diagnosis not present

## 2023-11-14 NOTE — Progress Notes (Signed)
  Subjective:  Patient ID: [name] [name], female    DOB: 09-11-1941,  MRN: [id number]  Chief Complaint  Patient presents with   Foot Pain    Patient is here for left heel pain F/U    82 y.o. female presents for follow-up of left heel bursitis/plantar fasciitis.  Doing better after prior steroid injection   Review of Systems: Negative except as noted in the HPI. Denies N/V/F/Ch.   Objective:  There were no vitals filed for this visit. There is no height or weight on file to calculate BMI. Constitutional Well developed. Well nourished.  Vascular Dorsalis pedis pulses palpable bilaterally. Posterior tibial pulses palpable bilaterally. Capillary refill normal to all digits.  No cyanosis or clubbing noted. Pedal hair growth normal.  Neurologic Normal speech. Oriented to person, place, and time. Epicritic sensation to light touch grossly present bilaterally.  Dermatologic Nails well groomed and normal in appearance. No open wounds. No skin lesions.  Orthopedic: Normal joint ROM without pain or crepitus bilaterally. No visible deformities. Decreased  tender to palpation at the calcaneal tuber left. No pain with calcaneal squeeze left. Ankle ROM diminished range of motion left. Silfverskiold Test: negative left.   Radiographs: Taken and reviewed. No acute fractures or dislocations. No evidence of stress fracture.  Plantar heel spur absent. Posterior heel spur absent.   Assessment:   1. Calcaneal bursitis (heel), left   2. Plantar fasciitis of left foot     Plan:  Patient was evaluated and treated and all questions answered.  Plantar heel bursitis, left -Much improved after prior steroid injection - Educated on icing and stretching. Instructions given.  - Injection d deferred will consider future injection if worsening - DME: Continue to recommend gel heel cups - Pharmacologic management: Tylenol or ibuprofen as needed    No follow-ups on file.

## 2023-12-03 DIAGNOSIS — N184 Chronic kidney disease, stage 4 (severe): Secondary | ICD-10-CM | POA: Diagnosis not present

## 2023-12-03 DIAGNOSIS — I700 Atherosclerosis of aorta: Secondary | ICD-10-CM | POA: Diagnosis not present

## 2023-12-03 DIAGNOSIS — J449 Chronic obstructive pulmonary disease, unspecified: Secondary | ICD-10-CM | POA: Diagnosis not present

## 2023-12-03 DIAGNOSIS — E785 Hyperlipidemia, unspecified: Secondary | ICD-10-CM | POA: Diagnosis not present

## 2023-12-03 DIAGNOSIS — E669 Obesity, unspecified: Secondary | ICD-10-CM | POA: Diagnosis not present

## 2023-12-03 DIAGNOSIS — R200 Anesthesia of skin: Secondary | ICD-10-CM | POA: Diagnosis not present

## 2023-12-03 DIAGNOSIS — F3341 Major depressive disorder, recurrent, in partial remission: Secondary | ICD-10-CM | POA: Diagnosis not present

## 2023-12-03 DIAGNOSIS — Z794 Long term (current) use of insulin: Secondary | ICD-10-CM | POA: Diagnosis not present

## 2023-12-03 DIAGNOSIS — E1149 Type 2 diabetes mellitus with other diabetic neurological complication: Secondary | ICD-10-CM | POA: Diagnosis not present

## 2023-12-03 DIAGNOSIS — F17210 Nicotine dependence, cigarettes, uncomplicated: Secondary | ICD-10-CM | POA: Diagnosis not present

## 2023-12-03 DIAGNOSIS — I129 Hypertensive chronic kidney disease with stage 1 through stage 4 chronic kidney disease, or unspecified chronic kidney disease: Secondary | ICD-10-CM | POA: Diagnosis not present

## 2023-12-03 DIAGNOSIS — E1129 Type 2 diabetes mellitus with other diabetic kidney complication: Secondary | ICD-10-CM | POA: Diagnosis not present

## 2024-01-07 DIAGNOSIS — I129 Hypertensive chronic kidney disease with stage 1 through stage 4 chronic kidney disease, or unspecified chronic kidney disease: Secondary | ICD-10-CM | POA: Diagnosis not present

## 2024-01-07 DIAGNOSIS — N184 Chronic kidney disease, stage 4 (severe): Secondary | ICD-10-CM | POA: Diagnosis not present

## 2024-01-07 DIAGNOSIS — R300 Dysuria: Secondary | ICD-10-CM | POA: Diagnosis not present

## 2024-01-07 DIAGNOSIS — E1129 Type 2 diabetes mellitus with other diabetic kidney complication: Secondary | ICD-10-CM | POA: Diagnosis not present

## 2024-01-07 DIAGNOSIS — Z794 Long term (current) use of insulin: Secondary | ICD-10-CM | POA: Diagnosis not present

## 2024-01-07 DIAGNOSIS — E785 Hyperlipidemia, unspecified: Secondary | ICD-10-CM | POA: Diagnosis not present

## 2024-01-10 DIAGNOSIS — R300 Dysuria: Secondary | ICD-10-CM | POA: Diagnosis not present

## 2024-02-19 DIAGNOSIS — L821 Other seborrheic keratosis: Secondary | ICD-10-CM | POA: Diagnosis not present

## 2024-02-19 DIAGNOSIS — D1801 Hemangioma of skin and subcutaneous tissue: Secondary | ICD-10-CM | POA: Diagnosis not present

## 2024-02-19 DIAGNOSIS — L299 Pruritus, unspecified: Secondary | ICD-10-CM | POA: Diagnosis not present

## 2024-02-19 DIAGNOSIS — D489 Neoplasm of uncertain behavior, unspecified: Secondary | ICD-10-CM | POA: Diagnosis not present

## 2024-02-19 DIAGNOSIS — L905 Scar conditions and fibrosis of skin: Secondary | ICD-10-CM | POA: Diagnosis not present

## 2024-02-19 DIAGNOSIS — L814 Other melanin hyperpigmentation: Secondary | ICD-10-CM | POA: Diagnosis not present

## 2024-02-19 DIAGNOSIS — D229 Melanocytic nevi, unspecified: Secondary | ICD-10-CM | POA: Diagnosis not present

## 2024-02-26 DIAGNOSIS — E1129 Type 2 diabetes mellitus with other diabetic kidney complication: Secondary | ICD-10-CM | POA: Diagnosis not present

## 2024-02-26 DIAGNOSIS — E1122 Type 2 diabetes mellitus with diabetic chronic kidney disease: Secondary | ICD-10-CM | POA: Diagnosis not present

## 2024-02-26 DIAGNOSIS — D509 Iron deficiency anemia, unspecified: Secondary | ICD-10-CM | POA: Diagnosis not present

## 2024-02-26 DIAGNOSIS — M549 Dorsalgia, unspecified: Secondary | ICD-10-CM | POA: Diagnosis not present

## 2024-02-26 DIAGNOSIS — R809 Proteinuria, unspecified: Secondary | ICD-10-CM | POA: Diagnosis not present

## 2024-02-26 DIAGNOSIS — Z87442 Personal history of urinary calculi: Secondary | ICD-10-CM | POA: Diagnosis not present

## 2024-02-26 DIAGNOSIS — N184 Chronic kidney disease, stage 4 (severe): Secondary | ICD-10-CM | POA: Diagnosis not present

## 2024-02-26 DIAGNOSIS — I129 Hypertensive chronic kidney disease with stage 1 through stage 4 chronic kidney disease, or unspecified chronic kidney disease: Secondary | ICD-10-CM | POA: Diagnosis not present

## 2024-03-19 DIAGNOSIS — L281 Prurigo nodularis: Secondary | ICD-10-CM | POA: Diagnosis not present

## 2024-03-19 DIAGNOSIS — C44729 Squamous cell carcinoma of skin of left lower limb, including hip: Secondary | ICD-10-CM | POA: Diagnosis not present

## 2024-03-19 DIAGNOSIS — B078 Other viral warts: Secondary | ICD-10-CM | POA: Diagnosis not present

## 2024-03-19 DIAGNOSIS — D489 Neoplasm of uncertain behavior, unspecified: Secondary | ICD-10-CM | POA: Diagnosis not present

## 2024-04-09 DIAGNOSIS — C44729 Squamous cell carcinoma of skin of left lower limb, including hip: Secondary | ICD-10-CM | POA: Diagnosis not present

## 2024-04-16 DIAGNOSIS — R809 Proteinuria, unspecified: Secondary | ICD-10-CM | POA: Diagnosis not present

## 2024-04-16 DIAGNOSIS — M549 Dorsalgia, unspecified: Secondary | ICD-10-CM | POA: Diagnosis not present

## 2024-04-16 DIAGNOSIS — N179 Acute kidney failure, unspecified: Secondary | ICD-10-CM | POA: Diagnosis not present

## 2024-04-16 DIAGNOSIS — N9089 Other specified noninflammatory disorders of vulva and perineum: Secondary | ICD-10-CM | POA: Diagnosis not present

## 2024-04-16 DIAGNOSIS — N390 Urinary tract infection, site not specified: Secondary | ICD-10-CM | POA: Diagnosis not present

## 2024-04-16 DIAGNOSIS — D509 Iron deficiency anemia, unspecified: Secondary | ICD-10-CM | POA: Diagnosis not present

## 2024-04-16 DIAGNOSIS — I129 Hypertensive chronic kidney disease with stage 1 through stage 4 chronic kidney disease, or unspecified chronic kidney disease: Secondary | ICD-10-CM | POA: Diagnosis not present

## 2024-04-16 DIAGNOSIS — Z87442 Personal history of urinary calculi: Secondary | ICD-10-CM | POA: Diagnosis not present

## 2024-04-16 DIAGNOSIS — E1129 Type 2 diabetes mellitus with other diabetic kidney complication: Secondary | ICD-10-CM | POA: Diagnosis not present

## 2024-04-16 DIAGNOSIS — N184 Chronic kidney disease, stage 4 (severe): Secondary | ICD-10-CM | POA: Diagnosis not present

## 2024-04-16 DIAGNOSIS — E1122 Type 2 diabetes mellitus with diabetic chronic kidney disease: Secondary | ICD-10-CM | POA: Diagnosis not present

## 2024-04-17 DIAGNOSIS — C44729 Squamous cell carcinoma of skin of left lower limb, including hip: Secondary | ICD-10-CM | POA: Diagnosis not present

## 2024-04-24 DIAGNOSIS — Z483 Aftercare following surgery for neoplasm: Secondary | ICD-10-CM | POA: Diagnosis not present

## 2024-04-28 DIAGNOSIS — N184 Chronic kidney disease, stage 4 (severe): Secondary | ICD-10-CM | POA: Diagnosis not present

## 2024-04-28 DIAGNOSIS — I2510 Atherosclerotic heart disease of native coronary artery without angina pectoris: Secondary | ICD-10-CM | POA: Diagnosis not present

## 2024-04-28 DIAGNOSIS — E1129 Type 2 diabetes mellitus with other diabetic kidney complication: Secondary | ICD-10-CM | POA: Diagnosis not present

## 2024-04-28 DIAGNOSIS — I129 Hypertensive chronic kidney disease with stage 1 through stage 4 chronic kidney disease, or unspecified chronic kidney disease: Secondary | ICD-10-CM | POA: Diagnosis not present

## 2024-04-28 DIAGNOSIS — D649 Anemia, unspecified: Secondary | ICD-10-CM | POA: Diagnosis not present

## 2024-04-28 DIAGNOSIS — E785 Hyperlipidemia, unspecified: Secondary | ICD-10-CM | POA: Diagnosis not present

## 2024-04-28 DIAGNOSIS — E559 Vitamin D deficiency, unspecified: Secondary | ICD-10-CM | POA: Diagnosis not present

## 2024-04-28 DIAGNOSIS — Z794 Long term (current) use of insulin: Secondary | ICD-10-CM | POA: Diagnosis not present

## 2024-04-28 DIAGNOSIS — E041 Nontoxic single thyroid nodule: Secondary | ICD-10-CM | POA: Diagnosis not present

## 2024-05-01 DIAGNOSIS — Z4802 Encounter for removal of sutures: Secondary | ICD-10-CM | POA: Diagnosis not present

## 2024-05-05 DIAGNOSIS — N184 Chronic kidney disease, stage 4 (severe): Secondary | ICD-10-CM | POA: Diagnosis not present

## 2024-05-05 DIAGNOSIS — E039 Hypothyroidism, unspecified: Secondary | ICD-10-CM | POA: Diagnosis not present

## 2024-05-05 DIAGNOSIS — E669 Obesity, unspecified: Secondary | ICD-10-CM | POA: Diagnosis not present

## 2024-05-05 DIAGNOSIS — R200 Anesthesia of skin: Secondary | ICD-10-CM | POA: Diagnosis not present

## 2024-05-05 DIAGNOSIS — J449 Chronic obstructive pulmonary disease, unspecified: Secondary | ICD-10-CM | POA: Diagnosis not present

## 2024-05-05 DIAGNOSIS — D329 Benign neoplasm of meninges, unspecified: Secondary | ICD-10-CM | POA: Diagnosis not present

## 2024-05-05 DIAGNOSIS — I129 Hypertensive chronic kidney disease with stage 1 through stage 4 chronic kidney disease, or unspecified chronic kidney disease: Secondary | ICD-10-CM | POA: Diagnosis not present

## 2024-05-05 DIAGNOSIS — Z1331 Encounter for screening for depression: Secondary | ICD-10-CM | POA: Diagnosis not present

## 2024-05-05 DIAGNOSIS — Z1339 Encounter for screening examination for other mental health and behavioral disorders: Secondary | ICD-10-CM | POA: Diagnosis not present

## 2024-05-05 DIAGNOSIS — I350 Nonrheumatic aortic (valve) stenosis: Secondary | ICD-10-CM | POA: Diagnosis not present

## 2024-05-05 DIAGNOSIS — E785 Hyperlipidemia, unspecified: Secondary | ICD-10-CM | POA: Diagnosis not present

## 2024-05-05 DIAGNOSIS — F3341 Major depressive disorder, recurrent, in partial remission: Secondary | ICD-10-CM | POA: Diagnosis not present

## 2024-05-05 DIAGNOSIS — Z0000 Encounter for general adult medical examination without abnormal findings: Secondary | ICD-10-CM | POA: Diagnosis not present

## 2024-05-05 DIAGNOSIS — E1129 Type 2 diabetes mellitus with other diabetic kidney complication: Secondary | ICD-10-CM | POA: Diagnosis not present

## 2024-05-05 DIAGNOSIS — E1149 Type 2 diabetes mellitus with other diabetic neurological complication: Secondary | ICD-10-CM | POA: Diagnosis not present

## 2024-07-09 DIAGNOSIS — E1129 Type 2 diabetes mellitus with other diabetic kidney complication: Secondary | ICD-10-CM | POA: Diagnosis not present

## 2024-07-09 DIAGNOSIS — Z794 Long term (current) use of insulin: Secondary | ICD-10-CM | POA: Diagnosis not present

## 2024-07-09 DIAGNOSIS — N184 Chronic kidney disease, stage 4 (severe): Secondary | ICD-10-CM | POA: Diagnosis not present

## 2024-07-09 DIAGNOSIS — I129 Hypertensive chronic kidney disease with stage 1 through stage 4 chronic kidney disease, or unspecified chronic kidney disease: Secondary | ICD-10-CM | POA: Diagnosis not present

## 2024-07-09 DIAGNOSIS — I2510 Atherosclerotic heart disease of native coronary artery without angina pectoris: Secondary | ICD-10-CM | POA: Diagnosis not present

## 2024-07-13 DIAGNOSIS — Z87442 Personal history of urinary calculi: Secondary | ICD-10-CM | POA: Diagnosis not present

## 2024-07-13 DIAGNOSIS — I129 Hypertensive chronic kidney disease with stage 1 through stage 4 chronic kidney disease, or unspecified chronic kidney disease: Secondary | ICD-10-CM | POA: Diagnosis not present

## 2024-07-13 DIAGNOSIS — N184 Chronic kidney disease, stage 4 (severe): Secondary | ICD-10-CM | POA: Diagnosis not present

## 2024-07-13 DIAGNOSIS — E1122 Type 2 diabetes mellitus with diabetic chronic kidney disease: Secondary | ICD-10-CM | POA: Diagnosis not present

## 2024-07-13 DIAGNOSIS — R809 Proteinuria, unspecified: Secondary | ICD-10-CM | POA: Diagnosis not present

## 2024-07-13 DIAGNOSIS — D509 Iron deficiency anemia, unspecified: Secondary | ICD-10-CM | POA: Diagnosis not present

## 2024-07-13 DIAGNOSIS — N390 Urinary tract infection, site not specified: Secondary | ICD-10-CM | POA: Diagnosis not present

## 2024-08-17 DIAGNOSIS — N390 Urinary tract infection, site not specified: Secondary | ICD-10-CM | POA: Diagnosis not present

## 2024-08-17 DIAGNOSIS — J449 Chronic obstructive pulmonary disease, unspecified: Secondary | ICD-10-CM | POA: Diagnosis not present

## 2024-08-17 DIAGNOSIS — E1129 Type 2 diabetes mellitus with other diabetic kidney complication: Secondary | ICD-10-CM | POA: Diagnosis not present

## 2024-08-17 DIAGNOSIS — N184 Chronic kidney disease, stage 4 (severe): Secondary | ICD-10-CM | POA: Diagnosis not present

## 2024-08-17 DIAGNOSIS — I129 Hypertensive chronic kidney disease with stage 1 through stage 4 chronic kidney disease, or unspecified chronic kidney disease: Secondary | ICD-10-CM | POA: Diagnosis not present

## 2024-08-17 DIAGNOSIS — G4733 Obstructive sleep apnea (adult) (pediatric): Secondary | ICD-10-CM | POA: Diagnosis not present

## 2024-08-25 DIAGNOSIS — E785 Hyperlipidemia, unspecified: Secondary | ICD-10-CM | POA: Diagnosis not present

## 2024-08-25 DIAGNOSIS — E039 Hypothyroidism, unspecified: Secondary | ICD-10-CM | POA: Diagnosis not present

## 2024-08-25 DIAGNOSIS — R21 Rash and other nonspecific skin eruption: Secondary | ICD-10-CM | POA: Diagnosis not present

## 2024-08-25 DIAGNOSIS — R5383 Other fatigue: Secondary | ICD-10-CM | POA: Diagnosis not present

## 2024-08-25 DIAGNOSIS — R531 Weakness: Secondary | ICD-10-CM | POA: Diagnosis not present

## 2024-08-25 DIAGNOSIS — N184 Chronic kidney disease, stage 4 (severe): Secondary | ICD-10-CM | POA: Diagnosis not present

## 2024-08-25 DIAGNOSIS — E1122 Type 2 diabetes mellitus with diabetic chronic kidney disease: Secondary | ICD-10-CM | POA: Diagnosis not present

## 2024-09-14 ENCOUNTER — Ambulatory Visit: Attending: Internal Medicine | Admitting: Physical Therapy

## 2024-09-14 ENCOUNTER — Other Ambulatory Visit: Payer: Self-pay

## 2024-09-14 DIAGNOSIS — R2681 Unsteadiness on feet: Secondary | ICD-10-CM | POA: Diagnosis not present

## 2024-09-14 DIAGNOSIS — M6281 Muscle weakness (generalized): Secondary | ICD-10-CM | POA: Diagnosis not present

## 2024-09-14 NOTE — Therapy (Signed)
 OUTPATIENT PHYSICAL THERAPY LOWER EXTREMITY EVALUATION   Patient Name: [name] [name] MRN: [id number] DOB:[birth year], 83 y.o., female Today's Date: 09/14/2024  END OF SESSION:  PT End of Session - 09/14/24 1410     Visit Number 1    Number of Visits 12    PT Start Time 0145    PT Stop Time 0220    PT Time Calculation (min) 35 min    Activity Tolerance Patient tolerated treatment well    Behavior During Therapy [facility] for tasks assessed/performed          Past Medical History:  Diagnosis Date   Aortic valve stenosis, mild 02/2016   Echo February 2017: possible bicuspid w/ moderate thickened and calicified, AVA ~1.34 cm -no symptoms; June 2022: Hyperdynamic EF 75%.  Mild to moderate AS-mean gradient 21 mmHg   COPD (chronic obstructive pulmonary disease) (HCC)    GERD (gastroesophageal reflux disease)    no problems since started on CPAP   Hematuria    History of adenomatous polyp of colon    tubular adnenoma's and hyperplastic polyp's 05-19-2002;  08-15-2010;  2015   History of kidney stones    multiple since 1970's   History of squamous cell carcinoma excision    05-14-2012  right arm;  04-18-2015  left ankle   HTN (hypertension)    Hyperlipidemia    Iron deficiency anemia    Left ureteral stone    OSA on CPAP    per study 10-13-2006 mild to moderate osa w/ hypersomnia   Seasonal and perennial allergic rhinitis    Type 2 diabetes mellitus (HCC)    type 2   Urgency of urination    Past Surgical History:  Procedure Laterality Date   BIOPSY  03/29/2020   Procedure: BIOPSY;  Surgeon: Rosalie Kitchens, MD;  Location: [facility];  Service: Endoscopy;;   CARPAL TUNNEL RELEASE Bilateral 2003   COLONOSCOPY  last one 2015   CT CHEST WITH CONTRAST   ([facility] HX)     Advanced aortic and branch vessel atherosclerosis. Tortuous thoracic aorta. Normal heart size, without pericardial effusion. Multivessel coronary artery atherosclerosis   CYSTOSCOPY WITH RETROGRADE PYELOGRAM,  URETEROSCOPY AND STENT PLACEMENT Left 04/15/2017   Procedure: CYSTOSCOPY WITH LEFT  RETROGRADE PYELOGRAM, URETEROSCOPYwith stone basketry;  Surgeon: Mark Ottelin, MD;  Location: [facility] [place] [facility];  Service: Urology;  Laterality: Left;   D & C HYSTEROSCOPY W/ RESECTION FIBROID AND POLYP  07/30/2000   DOBUTAMINE STRESS ECHO  01-30-2008   [facility]   no ischemia/  normal wall motion/  post stress ef 79%   ESOPHAGOGASTRODUODENOSCOPY (EGD) WITH PROPOFOL  N/A 01/28/2018   Procedure: ESOPHAGOGASTRODUODENOSCOPY (EGD) WITH PROPOFOL ;  Surgeon: Rosalie Kitchens, MD;  Location: [facility];  Service: Endoscopy;  Laterality: N/A;   ESOPHAGOGASTRODUODENOSCOPY (EGD) WITH PROPOFOL  N/A 08/19/2019   Procedure: ESOPHAGOGASTRODUODENOSCOPY (EGD) WITH PROPOFOL ;  Surgeon: Rosalie Kitchens, MD;  Location: [facility];  Service: Endoscopy;  Laterality: N/A;   ESOPHAGOGASTRODUODENOSCOPY (EGD) WITH PROPOFOL  N/A 03/29/2020   Procedure: ESOPHAGOGASTRODUODENOSCOPY (EGD) WITH PROPOFOL ;  Surgeon: Rosalie Kitchens, MD;  Location: [facility];  Service: Endoscopy;  Laterality: N/A;   FACIAL FRACTURE SURGERY  1955   trauma    HEMOSTASIS CLIP PLACEMENT  08/19/2019   Procedure: HEMOSTASIS CLIP PLACEMENT;  Surgeon: Rosalie Kitchens, MD;  Location: [facility];  Service: Endoscopy;;   HOT HEMOSTASIS N/A 01/28/2018   Procedure: HOT HEMOSTASIS (ARGON PLASMA COAGULATION/BICAP);  Surgeon: Rosalie Kitchens, MD;  Location: [facility];  Service: Endoscopy;  Laterality: N/A;   LAPAROSCOPIC  CHOLECYSTECTOMY  08/20/2001   w/ ERCP spincterotomy w/ balloon   NM MYOVIEW  LTD  7/'19; 6/'21   LOW RISK.  EF > 65%. No Ischemia or Infarction. Normal Study: Hyperdynamic.   POLYPECTOMY  08/19/2019   Procedure: POLYPECTOMY;  Surgeon: Rosalie Kitchens, MD;  Location: [facility];  Service: Endoscopy;;   TRANSTHORACIC ECHOCARDIOGRAM  02/13/2016   LVEF 55-60%. Gr 1 DD. ? possible bicuspid AoV w/ moderate thickened / calcified AoV w/ mild Stenosis (Mean Gradietn 15 mmHg, Valve  area 1.34cm^2), no regurg.     TRANSTHORACIC ECHOCARDIOGRAM  06/2018   a) EF 55-65%. Gr 1 DD. Mild AS (? Functionally bicuspid AoV w/ mod leaflet thickening) - Mean Grad 15 mmHg, Peak 32 mmHg; b) EF 65 to 70% with dynamic mid cavity gradient due to hyperdynamic LV (peak 28 mmHg-at rest).  Moderate LVH.  Mod AS, Mean grad 27 mmHg   TRANSTHORACIC ECHOCARDIOGRAM  06/26/2021   EF> 75%.  Hyperdynamic function.  Moderate LVH.  Indeterminate filling pressures.  Normal RV.  Moderate aortic valve calcification.  Mean gradient 21 mmHg.  (Mild to Moderate AS)-previous mean gradient was 27 mmHg.   TUBAL LIGATION Bilateral yrs ago   Patient Active Problem List   Diagnosis Date Noted   Rash and nonspecific skin eruption 04/25/2023   Multiple lung nodules on CT 09/20/2019   COPD mixed type (HCC) 09/16/2018   Coronary artery calcification seen on computed tomography 06/30/2018   Precordial pain 06/30/2018   Moderate aortic stenosis by prior echocardiogram 06/30/2018   Anemia 01/17/2017   Depression 01/17/2017   Thyroid  nodule 01/17/2017   Kidney stone 01/17/2017   Osteopenia 01/17/2017   Hyperlipidemia associated with type 2 diabetes mellitus (HCC) 01/17/2017   Leukocytosis 01/17/2017   Adenomatous colon polyp 01/17/2017   Dyspnea 07/20/2011   DIABETES, TYPE 2 07/19/2010   Essential hypertension 07/19/2010   Seasonal and perennial allergic rhinitis 07/19/2010   SIALADENITIS, RIGHT 07/19/2010   G E R D 07/19/2010   Obstructive sleep apnea 06/06/2010   REFERRING PROVIDER: Elsie Gentry MD  REFERRING DIAG: Generalized weakness.  THERAPY DIAG:  Unsteadiness on feet - Plan: PT plan of care cert/re-cert  Muscle weakness (generalized) - Plan: PT plan of care cert/re-cert  Rationale for Evaluation and Treatment: Rehabilitation  ONSET DATE: Ongoing.    SUBJECTIVE:   SUBJECTIVE STATEMENT: The patient presents tot he clinic with generalized c/o weakness and has reported falls when without her  assistive devices.  Strongly encouraged her to use at least a cane at all times.  She states she has good and bad days.  On days when she is feeling weaker it is recommended she use her FWW.    PERTINENT HISTORY: DM, h/o low back pain.   PAIN:    PRECAUTIONS: Fall  RED FLAGS: None   WEIGHT BEARING RESTRICTIONS: No  FALLS:  Has patient fallen in last 6 months? Yes. Number of falls 3-4.  LIVING ENVIRONMENT: Lives in: House/apartment Has following equipment at home: None.  Not using an assistive device today though highly recommended she do so at all times.   OCCUPATION: Retired.   PLOF: Independent  PATIENT GOALS: Get stronger and walk better.  OBJECTIVE:  Note: Objective measures were completed at Evaluation unless otherwise noted.  POSTURE: rounded shoulders, forward head, and flexed trunk .  The patient stands in 15 degrees of trunk flexion.     LOWER EXTREMITY ROM:  WFL.    LOWER EXTREMITY MMT:  Bilateral hip flexion is 4/5, abduction is  4+/5, knees and ankles 4+/5.  FUNCTIONAL TESTS:  5 times sit to stand: 15 seconds. Timed up and go (TUG): 17 seconds. Romberg wobbly and required supervision/CGA.  GAIT: The patient walks in a flexed trunk posture in a purposeful and cautious fashion with poor foot clearance and a decrease in step and stride length.  Stairs and ramp performed with one railing.                                                                                                                                  TREATMENT DATE: 09/14/24:  Nustep level 3 x 8 minutes.      PATIENT EDUCATION:  Education details: Discussed her fall risk and that she needs to use an assistive device at all times.   Person educated: Patient Education method: Explanation Education comprehension: verbalized understanding  HOME EXERCISE PROGRAM:    ASSESSMENT:  CLINICAL IMPRESSION: The patient presents to the clinic with c/o generalized weakness and she has had  falls when without an assistive.  Highly recommended she use an assistive device at all times.  Her TUG is 17 seconds and 5 time sit to stand is 15 seconds.  She walks The patient walks in a flexed trunk posture in a purposeful and cautious fashion with poor foot clearance and a decrease in step and stride length.  Stairs and ramp performed with one railing.  She has some LE weakness.   Patient will benefit from skilled PT intervention to address deficits.  OBJECTIVE IMPAIRMENTS: Abnormal gait, decreased activity tolerance, decreased balance, and decreased strength.   ACTIVITY LIMITATIONS: carrying, lifting, bending, and locomotion level  PARTICIPATION LIMITATIONS: meal prep, cleaning, laundry, community activity, and yard work  PERSONAL FACTORS: Time since onset of injury/illness/exacerbation and 1 comorbidity: DM are also affecting patient's functional outcome.   REHAB POTENTIAL: Good-  CLINICAL DECISION MAKING: Evolving/moderate complexity  EVALUATION COMPLEXITY: Moderate   GOALS:   SHORT TERM GOALS: Target date: 09/28/24  Ind with an initial HEP. Goal status: INITIAL   LONG TERM GOALS: Target date: 12/13/24  Ind with an advanced HEP.  Goal status: INITIAL  2.  Improve TUG by 2-3 seconds.  Goal status: INITIAL  3.  Improve 5 time sit to stand test by 2-3 seconds.  Goal status: INITIAL  4.  Walk 500 feet with a cane (straight or small base QC) with normal toe clearance. Goal status: INITIAL  PLAN:  PT FREQUENCY: 2x/week  PT DURATION: 6 weeks  PLANNED INTERVENTIONS: 97110-Therapeutic exercises, 97530- Therapeutic activity, [id number]- Neuromuscular re-education, 97535- Self Care, [id number]- Manual therapy, [phone]- Gait training, Patient/Family education, and Balance training  PLAN FOR NEXT SESSION: LE strengthening.  Gait and balance training.   Sterling Ucci, [place], PT 09/14/2024, 2:56 PM

## 2024-09-21 NOTE — Progress Notes (Unsigned)
 Patient ID: [name] [name], female    DOB: 1941/06/01, 83 y.o.   MRN: [id number]  HPI F former smoker- Followed for allergic rhinitis, hypersomnia w/ OSA, COPD, complicated by DM, HBP, GERD NPSG 10/10/06 RDI/AHI 15.6/hr PFT 12/09/2015-moderate obstruction, diffusion severely reduced.  FVC 1.6/59%, FEV1 1.18/58%, ratio 0.74, TLC 81%, DLCO 42%  ----------------------------------------------------------------------------  09/23/23- 83 year old female former smoker followed for OSA, COPD, Lung Nodules, allergic rhinitis, complicated by DM 2, HBP, GERD, HBP, Aortic Atherosclerosis/ CAD, Aortic Stenosis,   -Anoro /Trelegy 100, albuterol  hfa, Melatonin CPAP 8/ [employer] Download-compliance 100%, AHI 2.6/hr Body weight today-186 lbs Had flu vax. Download reviewed.  She feels very comfortable with her CPAP. Notes morning wheeze until she takes her inhalers.  She has been taking Anoro in the morning.  I suggested she try samples of Trelegy taken in the evening to see if that clears the morning wheeze.  09/22/24- 83 year old female former smoker followed for OSA, COPD, Lung Nodules, allergic rhinitis, complicated by DM 2, HBP, GERD, HBP, Aortic Atherosclerosis/ CAD, Aortic Stenosis,   -Anoro /Trelegy 100, albuterol  hfa, Melatonin CPAP 8/ [employer] Download-compliance 97%, AHI 1.2/hr Body weight today-170 lbs Discussed the use of AI scribe software for clinical note transcription with the patient, who gave verbal consent to proceed.  History of Present Illness   [name] [name] is an 83 year old female with sleep apnea who presents for CPAP follow-up.  She experiences deep sleep and does not wake up to external noises when using the CPAP machine, which is set at a fixed pressure of eight. Sleep quality declines when she forgets to use the CPAP. Shortness of breath occurs with physical activity, consistent with her baseline and without worsening.     Assessment and Plan:    Obstructive sleep  apnea Obstructive sleep apnea well-managed with CPAP. CPAP download shows <2 apneas/hour, indicating effective treatment. - Continue CPAP therapy with fixed pressure of eight. - Reinforced adherence to CPAP therapy.  Exertional dyspnea Exertional dyspnea present without worsening. No acute respiratory distress indicated by clear lungs and steady pulse.    History of Present Illness  Review of Systems- see HPI    + = positive Constitutional:   No-   weight loss, night sweats, fevers, chills, fatigue, lassitude. HEENT:   No-   headaches, difficulty swallowing, tooth/dental problems, sore throat,                  No-   sneezing, itching, ear ache, no-nasal congestion, post nasal drip,  CV:  No-   chest pain, orthopnea, PND, swelling in lower extremities, anasarca, dizziness, palpitations GI:  No-   heartburn, indigestion, abdominal pain, nausea, vomiting,  Resp:   No-  excess mucus,             No-   productive cough,  No non-productive cough,  No-  coughing up of blood.              No-   change in color of mucus.  No- wheezing.   Skin: No-   rash or lesions. GU:  MS:  No-   joint pain or swelling.  Psych:  No- change in mood or affect. No depression or anxiety.  No memory loss.   Objective:   Physical Exam General- Alert, Oriented, Affect-appropriate, Distress- none acute, +overweight Skin- rash-none, lesions- none, excoriation- none Lymphadenopathy- none Head- atraumatic            Eyes- Gross vision intact, PERRLA, conjunctivae clear secretions  Ears- Hearing, canals-normal            Nose- Clear, no-Septal dev, mucus, polyps, erosion, perforation             Throat- Mallampati III-IV , mucosa-not unusually dry , drainage- none,  tonsils- atrophic Neck- flexible , trachea midline, no stridor , thyroid  nl, carotid no bruit Chest - symmetrical excursion , unlabored           Heart/CV- RRR , no murmur , no gallop  , no rub, nl s1 s2                           - JVD- none ,  edema- none, stasis changes- none, varices- none           Lung- + clear, unlabored, no-wheezing,cough- none, dullness-none, rub- none           Chest wall-  Abd-  Br/ Gen/ Rectal- Not done, not indicated Extrem- cyanosis- none, clubbing, none, atrophy- none, strength- nl Neuro- grossly intact to observation

## 2024-09-22 ENCOUNTER — Ambulatory Visit: Payer: Medicare Other | Admitting: Internal Medicine

## 2024-09-22 ENCOUNTER — Encounter: Payer: Self-pay | Admitting: Internal Medicine

## 2024-09-22 VITALS — BP 136/68 | HR 57 | Temp 97.6°F | Ht 63.0 in | Wt 170.2 lb

## 2024-09-22 DIAGNOSIS — R0609 Other forms of dyspnea: Secondary | ICD-10-CM

## 2024-09-22 DIAGNOSIS — G4733 Obstructive sleep apnea (adult) (pediatric): Secondary | ICD-10-CM | POA: Diagnosis not present

## 2024-09-22 NOTE — Patient Instructions (Signed)
 Glad you are doing well- we can continue CPAP 8  Please call if we can help

## 2024-10-05 ENCOUNTER — Ambulatory Visit: Attending: Internal Medicine

## 2024-10-05 DIAGNOSIS — M6281 Muscle weakness (generalized): Secondary | ICD-10-CM | POA: Diagnosis not present

## 2024-10-05 DIAGNOSIS — R2681 Unsteadiness on feet: Secondary | ICD-10-CM | POA: Diagnosis not present

## 2024-10-05 NOTE — Therapy (Signed)
 OUTPATIENT PHYSICAL THERAPY LOWER EXTREMITY EVALUATION   Patient Name: [name] [name] MRN: [id number] DOB:1941/06/21, 83 y.o., female Today's Date: 10/05/2024  END OF SESSION:  PT End of Session - 10/05/24 1306     Visit Number 2    Number of Visits 12    PT Start Time 1300    PT Stop Time 1341    PT Time Calculation (min) 41 min    Activity Tolerance Patient tolerated treatment well    Behavior During Therapy [facility] for tasks assessed/performed          Past Medical History:  Diagnosis Date   Aortic valve stenosis, mild 02/2016   Echo February 2017: possible bicuspid w/ moderate thickened and calicified, AVA ~1.34 cm -no symptoms; June 2022: Hyperdynamic EF 75%.  Mild to moderate AS-mean gradient 21 mmHg   COPD (chronic obstructive pulmonary disease) (HCC)    GERD (gastroesophageal reflux disease)    no problems since started on CPAP   Hematuria    History of adenomatous polyp of colon    tubular adnenoma's and hyperplastic polyp's 05-19-2002;  08-15-2010;  2015   History of kidney stones    multiple since 1970's   History of squamous cell carcinoma excision    05-14-2012  right arm;  04-18-2015  left ankle   HTN (hypertension)    Hyperlipidemia    Iron deficiency anemia    Left ureteral stone    OSA on CPAP    per study 10-13-2006 mild to moderate osa w/ hypersomnia   Seasonal and perennial allergic rhinitis    Type 2 diabetes mellitus (HCC)    type 2   Urgency of urination    Past Surgical History:  Procedure Laterality Date   BIOPSY  03/29/2020   Procedure: BIOPSY;  Surgeon: Rosalie Kitchens, MD;  Location: [facility];  Service: Endoscopy;;   CARPAL TUNNEL RELEASE Bilateral 2003   COLONOSCOPY  last one 2015   CT CHEST WITH CONTRAST   ([facility] HX)     Advanced aortic and branch vessel atherosclerosis. Tortuous thoracic aorta. Normal heart size, without pericardial effusion. Multivessel coronary artery atherosclerosis   CYSTOSCOPY WITH RETROGRADE PYELOGRAM,  URETEROSCOPY AND STENT PLACEMENT Left 04/15/2017   Procedure: CYSTOSCOPY WITH LEFT  RETROGRADE PYELOGRAM, URETEROSCOPYwith stone basketry;  Surgeon: Mark Ottelin, MD;  Location: [facility];  Service: Urology;  Laterality: Left;   D & C HYSTEROSCOPY W/ RESECTION FIBROID AND POLYP  07/30/2000   DOBUTAMINE STRESS ECHO  01-30-2008   [facility]   no ischemia/  normal wall motion/  post stress ef 79%   ESOPHAGOGASTRODUODENOSCOPY (EGD) WITH PROPOFOL  N/A 01/28/2018   Procedure: ESOPHAGOGASTRODUODENOSCOPY (EGD) WITH PROPOFOL ;  Surgeon: Rosalie Kitchens, MD;  Location: [facility];  Service: Endoscopy;  Laterality: N/A;   ESOPHAGOGASTRODUODENOSCOPY (EGD) WITH PROPOFOL  N/A 08/19/2019   Procedure: ESOPHAGOGASTRODUODENOSCOPY (EGD) WITH PROPOFOL ;  Surgeon: Rosalie Kitchens, MD;  Location: [facility];  Service: Endoscopy;  Laterality: N/A;   ESOPHAGOGASTRODUODENOSCOPY (EGD) WITH PROPOFOL  N/A 03/29/2020   Procedure: ESOPHAGOGASTRODUODENOSCOPY (EGD) WITH PROPOFOL ;  Surgeon: Rosalie Kitchens, MD;  Location: [facility];  Service: Endoscopy;  Laterality: N/A;   FACIAL FRACTURE SURGERY  1955   trauma    HEMOSTASIS CLIP PLACEMENT  08/19/2019   Procedure: HEMOSTASIS CLIP PLACEMENT;  Surgeon: Rosalie Kitchens, MD;  Location: [facility];  Service: Endoscopy;;   HOT HEMOSTASIS N/A 01/28/2018   Procedure: HOT HEMOSTASIS (ARGON PLASMA COAGULATION/BICAP);  Surgeon: Rosalie Kitchens, MD;  Location: [facility];  Service: Endoscopy;  Laterality: N/A;   LAPAROSCOPIC  CHOLECYSTECTOMY  08/20/2001   w/ ERCP spincterotomy w/ balloon   NM MYOVIEW  LTD  7/'19; 6/'21   LOW RISK.  EF > 65%. No Ischemia or Infarction. Normal Study: Hyperdynamic.   POLYPECTOMY  08/19/2019   Procedure: POLYPECTOMY;  Surgeon: Rosalie Kitchens, MD;  Location: [facility];  Service: Endoscopy;;   TRANSTHORACIC ECHOCARDIOGRAM  02/13/2016   LVEF 55-60%. Gr 1 DD. ? possible bicuspid AoV w/ moderate thickened / calcified AoV w/ mild Stenosis (Mean Gradietn 15 mmHg, Valve  area 1.34cm^2), no regurg.     TRANSTHORACIC ECHOCARDIOGRAM  06/2018   a) EF 55-65%. Gr 1 DD. Mild AS (? Functionally bicuspid AoV w/ mod leaflet thickening) - Mean Grad 15 mmHg, Peak 32 mmHg; b) EF 65 to 70% with dynamic mid cavity gradient due to hyperdynamic LV (peak 28 mmHg-at rest).  Moderate LVH.  Mod AS, Mean grad 27 mmHg   TRANSTHORACIC ECHOCARDIOGRAM  06/26/2021   EF> 75%.  Hyperdynamic function.  Moderate LVH.  Indeterminate filling pressures.  Normal RV.  Moderate aortic valve calcification.  Mean gradient 21 mmHg.  (Mild to Moderate AS)-previous mean gradient was 27 mmHg.   TUBAL LIGATION Bilateral yrs ago   Patient Active Problem List   Diagnosis Date Noted   Rash and nonspecific skin eruption 04/25/2023   Multiple lung nodules on CT 09/20/2019   COPD mixed type (HCC) 09/16/2018   Coronary artery calcification seen on computed tomography 06/30/2018   Precordial pain 06/30/2018   Moderate aortic stenosis by prior echocardiogram 06/30/2018   Anemia 01/17/2017   Depression 01/17/2017   Thyroid  nodule 01/17/2017   Kidney stone 01/17/2017   Osteopenia 01/17/2017   Hyperlipidemia associated with type 2 diabetes mellitus (HCC) 01/17/2017   Leukocytosis 01/17/2017   Adenomatous colon polyp 01/17/2017   Dyspnea 07/20/2011   DIABETES, TYPE 2 07/19/2010   Essential hypertension 07/19/2010   Seasonal and perennial allergic rhinitis 07/19/2010   SIALADENITIS, RIGHT 07/19/2010   G E R D 07/19/2010   Obstructive sleep apnea 06/06/2010   REFERRING PROVIDER: Elsie Gentry MD  REFERRING DIAG: Generalized weakness.  THERAPY DIAG:  Unsteadiness on feet  Muscle weakness (generalized)  Rationale for Evaluation and Treatment: Rehabilitation  ONSET DATE: Ongoing.    SUBJECTIVE:   SUBJECTIVE STATEMENT: Pt reports 8/10 low back pain today.    PERTINENT HISTORY: DM, h/o low back pain.    PAIN: 8/10 low back pain  PRECAUTIONS: Fall  RED FLAGS: None   WEIGHT BEARING  RESTRICTIONS: No  FALLS:  Has patient fallen in last 6 months? Yes. Number of falls 3-4.  LIVING ENVIRONMENT: Lives in: House/apartment Has following equipment at home: None.  Not using an assistive device today though highly recommended she do so at all times.   OCCUPATION: Retired.   PLOF: Independent  PATIENT GOALS: Get stronger and walk better.  OBJECTIVE:  Note: Objective measures were completed at Evaluation unless otherwise noted.  POSTURE: rounded shoulders, forward head, and flexed trunk .  The patient stands in 15 degrees of trunk flexion.     LOWER EXTREMITY ROM:  WFL.    LOWER EXTREMITY MMT:  Bilateral hip flexion is 4/5, abduction is 4+/5, knees and ankles 4+/5.  FUNCTIONAL TESTS:  5 times sit to stand: 15 seconds. Timed up and go (TUG): 17 seconds. Romberg wobbly and required supervision/CGA.  GAIT: The patient walks in a flexed trunk posture in a purposeful and cautious fashion with poor foot clearance and a decrease in step and stride length.  Stairs and ramp  performed with one railing.                                                                                                                                  TREATMENT DATE:   10/05/24                                  EXERCISE LOG  Exercise Repetitions and Resistance Comments  Nustep  Lvl 3 x 15 mins   LAQs 2# 2 sets of 10 reps bil   Seated Marches 2# 2 sets of 10 reps bil   Seated Hip Abduction Red x 3 mins   Seated Hip Adduction 3 mins   Seated Ham Curls Red x 20 reps bil   STSs 2 sets of 5 reps    Blank cell = exercise not performed today   09/14/24:  Nustep level 3 x 8 minutes.      PATIENT EDUCATION:  Education details: Discussed her fall risk and that she needs to use an assistive device at all times.   Person educated: Patient Education method: Explanation Education comprehension: verbalized understanding  HOME EXERCISE PROGRAM:    ASSESSMENT:  CLINICAL IMPRESSION: Pt  arrives for today's treatment session reporting 8/10 low back pain. Pt able to complete all exercises today without increased back pain.  Pt instructed in several new seated BLE exercises to increase strength and function.  Pt requiring min cues for proper technique and posture for all newly added exercises.  Pt reported minimal decrease in low back pain at completion of today's treatment session.  OBJECTIVE IMPAIRMENTS: Abnormal gait, decreased activity tolerance, decreased balance, and decreased strength.   ACTIVITY LIMITATIONS: carrying, lifting, bending, and locomotion level  PARTICIPATION LIMITATIONS: meal prep, cleaning, laundry, community activity, and yard work  PERSONAL FACTORS: Time since onset of injury/illness/exacerbation and 1 comorbidity: DM are also affecting patient's functional outcome.   REHAB POTENTIAL: Good-  CLINICAL DECISION MAKING: Evolving/moderate complexity  EVALUATION COMPLEXITY: Moderate   GOALS:   SHORT TERM GOALS: Target date: 09/28/24  Ind with an initial HEP. Goal status: INITIAL   LONG TERM GOALS: Target date: 12/13/24  Ind with an advanced HEP.  Goal status: INITIAL  2.  Improve TUG by 2-3 seconds.  Goal status: INITIAL  3.  Improve 5 time sit to stand test by 2-3 seconds.  Goal status: INITIAL  4.  Walk 500 feet with a cane (straight or small base QC) with normal toe clearance. Goal status: INITIAL  PLAN:  PT FREQUENCY: 2x/week  PT DURATION: 6 weeks  PLANNED INTERVENTIONS: 97110-Therapeutic exercises, 97530- Therapeutic activity, [id number]- Neuromuscular re-education, 97535- Self Care, [id number]- Manual therapy, [phone]- Gait training, Patient/Family education, and Balance training  PLAN FOR NEXT SESSION: LE strengthening.  Gait and balance training.   Delon DELENA Gosling, PTA 10/05/2024, 1:44 PM

## 2024-10-12 ENCOUNTER — Ambulatory Visit: Admitting: Physical Therapy

## 2024-10-12 DIAGNOSIS — M6281 Muscle weakness (generalized): Secondary | ICD-10-CM

## 2024-10-12 DIAGNOSIS — R2681 Unsteadiness on feet: Secondary | ICD-10-CM | POA: Diagnosis not present

## 2024-10-12 NOTE — Therapy (Signed)
 [facility] at [facility] [address] [place], [place], [address] Phone: [phone]   Fax:  [phone]  Patient Details  Name: [name] [name] MRN: [id number] Date of Birth: 1941-07-29 Referring Provider:  Loreli Elsie JONETTA Mickey., MD  Encounter Date: 10/12/2024   Syble Picco, [place], PT 10/12/2024, 1:32 PM  [facility] at [facility] [address] [place], [place], [address] Phone: [phone]   Fax:  [phone]

## 2024-10-12 NOTE — Therapy (Signed)
 OUTPATIENT PHYSICAL THERAPY LOWER EXTREMITY EVALUATION   Patient Name: [name] [name] MRN: [id number] DOB:11/05/1941, 83 y.o., female Today's Date: 10/12/2024  END OF SESSION:  PT End of Session - 10/12/24 1317     Visit Number 3    Number of Visits 12    Date for Recertification  10/26/24    PT Start Time 0100          Past Medical History:  Diagnosis Date   Aortic valve stenosis, mild 02/2016   Echo February 2017: possible bicuspid w/ moderate thickened and calicified, AVA ~1.34 cm -no symptoms; June 2022: Hyperdynamic EF 75%.  Mild to moderate AS-mean gradient 21 mmHg   COPD (chronic obstructive pulmonary disease) (HCC)    GERD (gastroesophageal reflux disease)    no problems since started on CPAP   Hematuria    History of adenomatous polyp of colon    tubular adnenoma's and hyperplastic polyp's 05-19-2002;  08-15-2010;  2015   History of kidney stones    multiple since 1970's   History of squamous cell carcinoma excision    05-14-2012  right arm;  04-18-2015  left ankle   HTN (hypertension)    Hyperlipidemia    Iron deficiency anemia    Left ureteral stone    OSA on CPAP    per study 10-13-2006 mild to moderate osa w/ hypersomnia   Seasonal and perennial allergic rhinitis    Type 2 diabetes mellitus (HCC)    type 2   Urgency of urination    Past Surgical History:  Procedure Laterality Date   BIOPSY  03/29/2020   Procedure: BIOPSY;  Surgeon: Rosalie Kitchens, MD;  Location: [facility];  Service: Endoscopy;;   CARPAL TUNNEL RELEASE Bilateral 2003   COLONOSCOPY  last one 2015   CT CHEST WITH CONTRAST   ([facility] HX)     Advanced aortic and branch vessel atherosclerosis. Tortuous thoracic aorta. Normal heart size, without pericardial effusion. Multivessel coronary artery atherosclerosis   CYSTOSCOPY WITH RETROGRADE PYELOGRAM, URETEROSCOPY AND STENT PLACEMENT Left 04/15/2017   Procedure: CYSTOSCOPY WITH LEFT  RETROGRADE PYELOGRAM, URETEROSCOPYwith stone basketry;   Surgeon: Mark Ottelin, MD;  Location: [facility];  Service: Urology;  Laterality: Left;   D & C HYSTEROSCOPY W/ RESECTION FIBROID AND POLYP  07/30/2000   DOBUTAMINE STRESS ECHO  01-30-2008   [facility]   no ischemia/  normal wall motion/  post stress ef 79%   ESOPHAGOGASTRODUODENOSCOPY (EGD) WITH PROPOFOL  N/A 01/28/2018   Procedure: ESOPHAGOGASTRODUODENOSCOPY (EGD) WITH PROPOFOL ;  Surgeon: Rosalie Kitchens, MD;  Location: [facility];  Service: Endoscopy;  Laterality: N/A;   ESOPHAGOGASTRODUODENOSCOPY (EGD) WITH PROPOFOL  N/A 08/19/2019   Procedure: ESOPHAGOGASTRODUODENOSCOPY (EGD) WITH PROPOFOL ;  Surgeon: Rosalie Kitchens, MD;  Location: [facility];  Service: Endoscopy;  Laterality: N/A;   ESOPHAGOGASTRODUODENOSCOPY (EGD) WITH PROPOFOL  N/A 03/29/2020   Procedure: ESOPHAGOGASTRODUODENOSCOPY (EGD) WITH PROPOFOL ;  Surgeon: Rosalie Kitchens, MD;  Location: [facility];  Service: Endoscopy;  Laterality: N/A;   FACIAL FRACTURE SURGERY  1955   trauma    HEMOSTASIS CLIP PLACEMENT  08/19/2019   Procedure: HEMOSTASIS CLIP PLACEMENT;  Surgeon: Rosalie Kitchens, MD;  [facility]: [facility];  Service: Endoscopy;;   HOT HEMOSTASIS N/A 01/28/2018   Procedure: HOT HEMOSTASIS (ARGON PLASMA COAGULATION/BICAP);  Surgeon: Rosalie Kitchens, MD;  Location: [facility];  Service: Endoscopy;  Laterality: N/A;   LAPAROSCOPIC CHOLECYSTECTOMY  08/20/2001   w/ ERCP spincterotomy w/ balloon   NM MYOVIEW  LTD  7/'19; 6/'21   LOW RISK.  EF > 65%. No  Ischemia or Infarction. Normal Study: Hyperdynamic.   POLYPECTOMY  08/19/2019   Procedure: POLYPECTOMY;  Surgeon: Rosalie Kitchens, MD;  [facility]: [facility];  Service: Endoscopy;;   TRANSTHORACIC ECHOCARDIOGRAM  02/13/2016   LVEF 55-60%. Gr 1 DD. ? possible bicuspid AoV w/ moderate thickened / calcified AoV w/ mild Stenosis (Mean Gradietn 15 mmHg, Valve area 1.34cm^2), no regurg.     TRANSTHORACIC ECHOCARDIOGRAM  06/2018   a) EF 55-65%. Gr 1 DD. Mild AS (? Functionally bicuspid AoV w/ mod  leaflet thickening) - Mean Grad 15 mmHg, Peak 32 mmHg; b) EF 65 to 70% with dynamic mid cavity gradient due to hyperdynamic LV (peak 28 mmHg-at rest).  Moderate LVH.  Mod AS, Mean grad 27 mmHg   TRANSTHORACIC ECHOCARDIOGRAM  06/26/2021   EF> 75%.  Hyperdynamic function.  Moderate LVH.  Indeterminate filling pressures.  Normal RV.  Moderate aortic valve calcification.  Mean gradient 21 mmHg.  (Mild to Moderate AS)-previous mean gradient was 27 mmHg.   TUBAL LIGATION Bilateral yrs ago   Patient Active Problem List   Diagnosis Date Noted   Rash and nonspecific skin eruption 04/25/2023   Multiple lung nodules on CT 09/20/2019   COPD mixed type (HCC) 09/16/2018   Coronary artery calcification seen on computed tomography 06/30/2018   Precordial pain 06/30/2018   Moderate aortic stenosis by prior echocardiogram 06/30/2018   Anemia 01/17/2017   Depression 01/17/2017   Thyroid  nodule 01/17/2017   Kidney stone 01/17/2017   Osteopenia 01/17/2017   Hyperlipidemia associated with type 2 diabetes mellitus (HCC) 01/17/2017   Leukocytosis 01/17/2017   Adenomatous colon polyp 01/17/2017   Dyspnea 07/20/2011   DIABETES, TYPE 2 07/19/2010   Essential hypertension 07/19/2010   Seasonal and perennial allergic rhinitis 07/19/2010   SIALADENITIS, RIGHT 07/19/2010   G E R D 07/19/2010   Obstructive sleep apnea 06/06/2010   REFERRING PROVIDER: Elsie Gentry MD  REFERRING DIAG: Generalized weakness.  THERAPY DIAG:  Unsteadiness on feet  Muscle weakness (generalized)  Rationale for Evaluation and Treatment: Rehabilitation  ONSET DATE: Ongoing.    SUBJECTIVE:   SUBJECTIVE STATEMENT: Pt reports 8/10 low back pain today.    PERTINENT HISTORY: DM, h/o low back pain.    PAIN: 8/10 low back pain  PRECAUTIONS: Fall  RED FLAGS: None   WEIGHT BEARING RESTRICTIONS: No  FALLS:  Has patient fallen in last 6 months? Yes. Number of falls 3-4.  LIVING ENVIRONMENT: Lives in: House/apartment Has  following equipment at home: None.  Not using an assistive device today though highly recommended she do so at all times.   OCCUPATION: Retired.   PLOF: Independent  PATIENT GOALS: Get stronger and walk better.  OBJECTIVE:  Note: Objective measures were completed at Evaluation unless otherwise noted.  POSTURE: rounded shoulders, forward head, and flexed trunk .  The patient stands in 15 degrees of trunk flexion.     LOWER EXTREMITY ROM:  WFL.    LOWER EXTREMITY MMT:  Bilateral hip flexion is 4/5, abduction is 4+/5, knees and ankles 4+/5.  FUNCTIONAL TESTS:  5 times sit to stand: 15 seconds. Timed up and go (TUG): 17 seconds. Romberg wobbly and required supervision/CGA.  GAIT: The patient walks in a flexed trunk posture in a purposeful and cautious fashion with poor foot clearance and a decrease in step and stride length.  Stairs and ramp performed with one railing.  TREATMENT DATE:    10/12/24:                                     EXERCISE LOG  Exercise Repetitions and Resistance Comments  Nustep Level 3 x 18 minutes   Rockerboard In parallel bars x 5 minutes   Foam balance beam In parallel bars x 5 minutes   Adductor Squeeze Ball squeeze x 4 minutes   Seated hip abduction 4 minutes    LAQ's 3# x 4 minutes      10/05/24                                  EXERCISE LOG  Exercise Repetitions and Resistance Comments  Nustep  Lvl 3 x 15 mins   LAQs 2# 2 sets of 10 reps bil   Seated Marches 2# 2 sets of 10 reps bil   Seated Hip Abduction Red x 3 mins   Seated Hip Adduction 3 mins   Seated Ham Curls Red x 20 reps bil   STSs 2 sets of 5 reps    Blank cell = exercise not performed today   09/14/24:  Nustep level 3 x 8 minutes.      PATIENT EDUCATION:  Education details: See below. Person educated: Patient Education method:  Explanation Education comprehension: verbalized understanding, handouts  HOME EXERCISE PROGRAM:  HOME EXERCISE PROGRAM [[id number]]  Adductor Squeeze MET -  Repeat 4 Repetitions, Hold 5 Seconds, Perform 2  GERIATRIC - ELASTIC BAND - HIP ABDUCTION - SEATED CLAMS -  Repeat 20 Repetitions, Hold 1 Second(s), Complete 2 Sets, Perform 2 Times a Day  Eccentric Long Arc Quad -  Repeat 20 Repetitions, Hold 2 Seconds, Complete 2 Sets, Perform 2 Times a Day  ASSESSMENT:  CLINICAL IMPRESSION: Patient did well today with balance interventions today with minimal hand usage on parallel bars.    OBJECTIVE IMPAIRMENTS: Abnormal gait, decreased activity tolerance, decreased balance, and decreased strength.   ACTIVITY LIMITATIONS: carrying, lifting, bending, and locomotion level  PARTICIPATION LIMITATIONS: meal prep, cleaning, laundry, community activity, and yard work  PERSONAL FACTORS: Time since onset of injury/illness/exacerbation and 1 comorbidity: DM are also affecting patient's functional outcome.   REHAB POTENTIAL: Good-  CLINICAL DECISION MAKING: Evolving/moderate complexity  EVALUATION COMPLEXITY: Moderate   GOALS:   SHORT TERM GOALS: Target date: 09/28/24  Ind with an initial HEP. Goal status: INITIAL   LONG TERM GOALS: Target date: 12/13/24  Ind with an advanced HEP.  Goal status: INITIAL  2.  Improve TUG by 2-3 seconds.  Goal status: INITIAL  3.  Improve 5 time sit to stand test by 2-3 seconds.  Goal status: INITIAL  4.  Walk 500 feet with a cane (straight or small base QC) with normal toe clearance. Goal status: INITIAL  PLAN:  PT FREQUENCY: 2x/week  PT DURATION: 6 weeks  PLANNED INTERVENTIONS: 97110-Therapeutic exercises, 97530- Therapeutic activity, [id number]- Neuromuscular re-education, 97535- Self Care, 02859- Manual therapy, [phone]- Gait training, Patient/Family education, and Balance training  PLAN FOR NEXT SESSION: LE strengthening.  Gait and balance  training.   Patti Shorb, [place], PT 10/12/2024, 1:33 PM

## 2024-10-13 DIAGNOSIS — N184 Chronic kidney disease, stage 4 (severe): Secondary | ICD-10-CM | POA: Diagnosis not present

## 2024-10-13 DIAGNOSIS — Z87442 Personal history of urinary calculi: Secondary | ICD-10-CM | POA: Diagnosis not present

## 2024-10-13 DIAGNOSIS — B3731 Acute candidiasis of vulva and vagina: Secondary | ICD-10-CM | POA: Diagnosis not present

## 2024-10-13 DIAGNOSIS — R300 Dysuria: Secondary | ICD-10-CM | POA: Diagnosis not present

## 2024-10-13 DIAGNOSIS — I129 Hypertensive chronic kidney disease with stage 1 through stage 4 chronic kidney disease, or unspecified chronic kidney disease: Secondary | ICD-10-CM | POA: Diagnosis not present

## 2024-10-13 DIAGNOSIS — D509 Iron deficiency anemia, unspecified: Secondary | ICD-10-CM | POA: Diagnosis not present

## 2024-10-13 DIAGNOSIS — R809 Proteinuria, unspecified: Secondary | ICD-10-CM | POA: Diagnosis not present

## 2024-10-13 DIAGNOSIS — E1122 Type 2 diabetes mellitus with diabetic chronic kidney disease: Secondary | ICD-10-CM | POA: Diagnosis not present

## 2024-10-14 ENCOUNTER — Encounter: Admitting: Physical Therapy

## 2024-10-14 ENCOUNTER — Encounter (HOSPITAL_BASED_OUTPATIENT_CLINIC_OR_DEPARTMENT_OTHER): Payer: Self-pay | Admitting: Emergency Medicine

## 2024-10-14 ENCOUNTER — Other Ambulatory Visit: Payer: Self-pay

## 2024-10-14 ENCOUNTER — Emergency Department (HOSPITAL_BASED_OUTPATIENT_CLINIC_OR_DEPARTMENT_OTHER)

## 2024-10-14 ENCOUNTER — Emergency Department (HOSPITAL_BASED_OUTPATIENT_CLINIC_OR_DEPARTMENT_OTHER)
Admission: EM | Admit: 2024-10-14 | Discharge: 2024-10-14 | Disposition: A | Discharge status: Home / Self Care | Attending: Emergency Medicine | Admitting: Emergency Medicine

## 2024-10-14 DIAGNOSIS — S50812A Abrasion of left forearm, initial encounter: Secondary | ICD-10-CM | POA: Diagnosis not present

## 2024-10-14 DIAGNOSIS — Z23 Encounter for immunization: Secondary | ICD-10-CM | POA: Diagnosis not present

## 2024-10-14 DIAGNOSIS — Z794 Long term (current) use of insulin: Secondary | ICD-10-CM | POA: Insufficient documentation

## 2024-10-14 DIAGNOSIS — S80212A Abrasion, left knee, initial encounter: Secondary | ICD-10-CM | POA: Diagnosis not present

## 2024-10-14 DIAGNOSIS — Z7982 Long term (current) use of aspirin: Secondary | ICD-10-CM | POA: Diagnosis not present

## 2024-10-14 DIAGNOSIS — Y9301 Activity, walking, marching and hiking: Secondary | ICD-10-CM | POA: Insufficient documentation

## 2024-10-14 DIAGNOSIS — S80211A Abrasion, right knee, initial encounter: Secondary | ICD-10-CM | POA: Diagnosis not present

## 2024-10-14 DIAGNOSIS — M79601 Pain in right arm: Secondary | ICD-10-CM | POA: Diagnosis not present

## 2024-10-14 DIAGNOSIS — W19XXXA Unspecified fall, initial encounter: Secondary | ICD-10-CM | POA: Insufficient documentation

## 2024-10-14 DIAGNOSIS — S8990XA Unspecified injury of unspecified lower leg, initial encounter: Secondary | ICD-10-CM

## 2024-10-14 DIAGNOSIS — M25562 Pain in left knee: Secondary | ICD-10-CM | POA: Diagnosis not present

## 2024-10-14 DIAGNOSIS — M25561 Pain in right knee: Secondary | ICD-10-CM | POA: Diagnosis not present

## 2024-10-14 DIAGNOSIS — M79602 Pain in left arm: Secondary | ICD-10-CM | POA: Diagnosis not present

## 2024-10-14 DIAGNOSIS — Z79899 Other long term (current) drug therapy: Secondary | ICD-10-CM | POA: Diagnosis not present

## 2024-10-14 DIAGNOSIS — S50811A Abrasion of right forearm, initial encounter: Secondary | ICD-10-CM | POA: Insufficient documentation

## 2024-10-14 DIAGNOSIS — S59911A Unspecified injury of right forearm, initial encounter: Secondary | ICD-10-CM | POA: Diagnosis present

## 2024-10-14 MED ORDER — HYDROCODONE-ACETAMINOPHEN 5-325 MG PO TABS: 1.0000 | ORAL_TABLET | ORAL | 0 refills | Status: AC | PRN

## 2024-10-14 MED ORDER — TETANUS-DIPHTH-ACELL PERTUSSIS 5-2-15.5 LF-MCG/0.5 IM SUSP
0.5000 mL | Freq: Once | INTRAMUSCULAR | Status: AC
  Administered 2024-10-14: 0.5 mL via INTRAMUSCULAR
  Filled 2024-10-14: qty 0.5

## 2024-10-14 MED ORDER — HYDROCODONE-ACETAMINOPHEN 5-325 MG PO TABS
1.0000 | ORAL_TABLET | Freq: Once | ORAL | Status: AC
  Administered 2024-10-14: 1 via ORAL
  Filled 2024-10-14: qty 1

## 2024-10-14 NOTE — ED Triage Notes (Signed)
 Pt caox4 reporting she fell while walking her dog yesterday, c/o bilat knee pain and bilat arm pain with abrasions/skin tears. Denies LOC, unsure if hit head, does not take blood thinner.

## 2024-10-14 NOTE — ED Provider Notes (Signed)
 [place] EMERGENCY DEPARTMENT AT [facility] Provider Note   CSN: [id number] Arrival date & time: 10/14/24  1308     Patient presents with: Fall   [name] [name] is a 83 y.o. female.  {Add pertinent medical, surgical, social history, OB history to HPI:6323} 83 year old female who presents emergency department after a fall.  Yesterday she was taking her dog out when it pulled her down on the driveway.  Says that she fell on her hands and knees.  Had several small abrasions that she rinsed off but has been having worsening knee pain especially on the right.  Unsure of last Tdap.  Still is able to walk but is requiring some assistance through the pain.  No head strike or LOC when this happened.  No prolonged downtime       Prior to Admission medications   Medication Sig Start Date End Date Taking? Authorizing Provider  albuterol  (VENTOLIN  HFA) 108 (90 Base) MCG/ACT inhaler Inhale 2 puffs into the lungs every 6 (six) hours as needed for wheezing or shortness of breath.    [provider]  aspirin EC 81 MG tablet Take 81 mg by mouth at bedtime.    [provider]  Carboxymethylcellul-Glycerin (LUBRICATING EYE DROPS OP) Place 1 drop into both eyes 3 (three) times daily as needed (dry eyes).     [provider]  Cholecalciferol (VITAMIN D-3) 125 MCG (5000 UT) TABS Take 5,000 Units by mouth See admin instructions. Take 1 tablet (5000 units) by mouth every evening, except Fridays.    [provider]  diclofenac  Sodium (VOLTAREN ) 1 % GEL Apply 2 g topically 4 (four) times daily. 03/30/21   Couture, Cortni S, PA-C  diltiazem  (CARDIZEM  CD) 240 MG 24 hr capsule TAKE 1 CAPSULE DAILY 07/26/21   Anner Alm ORN, MD  escitalopram (LEXAPRO) 10 MG tablet Take 10 mg by mouth every morning.     [provider]  ferrous sulfate 325 (65 FE) MG tablet Take 325 mg by mouth daily with breakfast.    [provider]  Fluticasone-Umeclidin-Vilant  (TRELEGY ELLIPTA ) 100-62.5-25 MCG/ACT AEPB Inhale 1 puff into the lungs daily. 03/16/22   Neysa Rama D, MD  gabapentin (NEURONTIN) 100 MG capsule Take 100 mg by mouth daily. Take 2(200mg ) tablets by mouth in the morning    [provider]  gabapentin (NEURONTIN) 300 MG capsule Take 300 mg by mouth daily. Take 1 tablets in the evening.    [provider]  icosapent Ethyl (VASCEPA) 1 g capsule Take 2 capsules by mouth 2 (two) times daily.     [provider]  insulin aspart (NOVOLOG FLEXPEN) 100 UNIT/ML FlexPen Inject 8 Units into the skin 3 (three) times daily with meals. Patient not taking: Reported on 09/22/2024    [provider]  insulin detemir (LEVEMIR) 100 UNIT/ML injection Inject 40 Units into the skin daily.  Patient not taking: Reported on 09/22/2024    [provider]  losartan  (COZAAR ) 100 MG tablet TAKE 1 TABLET DAILY 07/26/22   Anner Alm ORN, MD  rosuvastatin (CRESTOR) 10 MG tablet Take 10 mg by mouth at bedtime. 01/26/21   [provider]  traMADol (ULTRAM) 50 MG tablet Take 50 mg by mouth every 6 (six) hours as needed.    [provider]  vitamin B-12 (CYANOCOBALAMIN) 500 MCG tablet Take 500 mcg by mouth daily.    [provider]  Vitamin D, Ergocalciferol, (DRISDOL) 50000 units CAPS capsule Take 50,000 Units by mouth  every Friday.     [provider]    Allergies: Cephalexin and Metformin and related    Review of Systems  Updated Vital Signs BP (!) 117/54 (BP Location: Right Arm)   Pulse (!) 55   Temp 97.8 F (36.6 C) (Tympanic)   Resp 16   Ht 5' 3 (1.6 m)   Wt 77.1 kg   SpO2 96%   BMI 30.11 kg/m   Physical Exam Constitutional:      General: She is not in acute distress.    Appearance: Normal appearance. She is not ill-appearing.  HENT:     Head: Normocephalic and atraumatic.     Right Ear: External ear normal.     Left Ear: External ear normal.     Mouth/Throat:     Mouth:  Mucous membranes are moist.     Pharynx: Oropharynx is clear.  Eyes:     Extraocular Movements: Extraocular movements intact.     Conjunctiva/sclera: Conjunctivae normal.     Pupils: Pupils are equal, round, and reactive to light.  Neck:     Comments: No C-spine midline tenderness to palpation Musculoskeletal:        General: No deformity. Normal range of motion.     Cervical back: No rigidity or tenderness.     Comments: No tenderness to palpation, bruising, or deformities noted of bilateral shoulders, elbows, wrists, hips, or ankles.  Tenderness palpation of bilateral patellas.  Significant bruising and swelling of the right knee.  Full extensor function in bilateral knees.  Skin:    Comments: Several small abrasions on bilateral forearms.  Abrasion of the knees bilaterally.  Bleeding controlled.  Neurological:     General: No focal deficit present.     Mental Status: She is alert. Mental status is at baseline.     Cranial Nerves: No cranial nerve deficit.     Motor: No weakness.     (all labs ordered are listed, but only abnormal results are displayed) Labs Reviewed - No data to display  EKG: None  Radiology: No results found.  {Document cardiac monitor, telemetry assessment procedure when appropriate:32947} Procedures   Medications Ordered in the ED  Tdap (ADACEL) injection 0.5 mL (has no administration in time range)      {Click here for ABCD2, HEART and other calculators REFRESH Note before signing:1}                              Medical Decision Making Amount and/or Complexity of Data Reviewed Radiology: ordered.  Risk Prescription drug management.   ***  {Document critical care time when appropriate  Document review of labs and clinical decision tools ie CHADS2VASC2, etc  Document your independent review of radiology images and any outside records  Document your discussion with family members, caretakers and with consultants  Document social determinants  of health affecting pt's care  Document your decision making why or why not admission, treatments were needed:32947:::1}   Final diagnoses:  None    ED Discharge Orders     None

## 2024-10-14 NOTE — Discharge Instructions (Signed)
 You were seen for your knee injury in the emergency department.   At home, please ice your knee and elevate it to limit the swelling.  Take Tylenol  for the pain. You may also take the Norco we have prescribed you for any breakthrough pain that may have.  Do not take this before driving or operating heavy machinery.  Do not take this medication with alcohol .  It does contain Tylenol  so be mindful of the daily limit of Tylenol   Check your MyChart online for the results of any tests that had not resulted by the time you left the emergency department.   Follow-up with the orthopedic doctors in 1 to 2 weeks if your symptoms are persisting  Return immediately to the emergency department if you experience any of the following: Worsening pain, or any other concerning symptoms.    Thank you for visiting our Emergency Department. It was a pleasure taking care of you today.

## 2024-10-19 ENCOUNTER — Ambulatory Visit: Admitting: Physical Therapy

## 2024-11-05 DIAGNOSIS — J449 Chronic obstructive pulmonary disease, unspecified: Secondary | ICD-10-CM | POA: Diagnosis not present

## 2024-11-05 DIAGNOSIS — M8580 Other specified disorders of bone density and structure, unspecified site: Secondary | ICD-10-CM | POA: Diagnosis not present

## 2024-11-05 DIAGNOSIS — E1122 Type 2 diabetes mellitus with diabetic chronic kidney disease: Secondary | ICD-10-CM | POA: Diagnosis not present

## 2024-11-05 DIAGNOSIS — I2510 Atherosclerotic heart disease of native coronary artery without angina pectoris: Secondary | ICD-10-CM | POA: Diagnosis not present

## 2024-11-05 DIAGNOSIS — R5383 Other fatigue: Secondary | ICD-10-CM | POA: Diagnosis not present

## 2024-11-05 DIAGNOSIS — F17210 Nicotine dependence, cigarettes, uncomplicated: Secondary | ICD-10-CM | POA: Diagnosis not present

## 2024-11-05 DIAGNOSIS — I129 Hypertensive chronic kidney disease with stage 1 through stage 4 chronic kidney disease, or unspecified chronic kidney disease: Secondary | ICD-10-CM | POA: Diagnosis not present

## 2024-11-05 DIAGNOSIS — E785 Hyperlipidemia, unspecified: Secondary | ICD-10-CM | POA: Diagnosis not present

## 2024-11-05 DIAGNOSIS — N184 Chronic kidney disease, stage 4 (severe): Secondary | ICD-10-CM | POA: Diagnosis not present

## 2024-11-05 DIAGNOSIS — R200 Anesthesia of skin: Secondary | ICD-10-CM | POA: Diagnosis not present

## 2024-11-05 DIAGNOSIS — K219 Gastro-esophageal reflux disease without esophagitis: Secondary | ICD-10-CM | POA: Diagnosis not present

## 2024-11-05 DIAGNOSIS — E039 Hypothyroidism, unspecified: Secondary | ICD-10-CM | POA: Diagnosis not present
